# Patient Record
Sex: Male | Born: 1950
Health system: Southern US, Community
[De-identification: ages and names within clinical notes are randomized; demographics above are authoritative.]

## PROBLEM LIST (undated history)

## (undated) DIAGNOSIS — G20A1 Parkinson's disease without dyskinesia, without mention of fluctuations: Secondary | ICD-10-CM

## (undated) DIAGNOSIS — F32A Depression, unspecified: Secondary | ICD-10-CM

## (undated) DIAGNOSIS — M545 Low back pain, unspecified: Secondary | ICD-10-CM

## (undated) DIAGNOSIS — F319 Bipolar disorder, unspecified: Secondary | ICD-10-CM

## (undated) DIAGNOSIS — M1990 Unspecified osteoarthritis, unspecified site: Secondary | ICD-10-CM

## (undated) DIAGNOSIS — F419 Anxiety disorder, unspecified: Secondary | ICD-10-CM

## (undated) DIAGNOSIS — G8929 Other chronic pain: Secondary | ICD-10-CM

## (undated) DIAGNOSIS — K219 Gastro-esophageal reflux disease without esophagitis: Secondary | ICD-10-CM

## (undated) DIAGNOSIS — F429 Obsessive-compulsive disorder, unspecified: Secondary | ICD-10-CM

## (undated) DIAGNOSIS — IMO0002 Reserved for concepts with insufficient information to code with codable children: Secondary | ICD-10-CM

## (undated) DIAGNOSIS — J189 Pneumonia, unspecified organism: Secondary | ICD-10-CM

## (undated) DIAGNOSIS — G20 Parkinson's disease: Secondary | ICD-10-CM

## (undated) DIAGNOSIS — I639 Cerebral infarction, unspecified: Secondary | ICD-10-CM

## (undated) DIAGNOSIS — F329 Major depressive disorder, single episode, unspecified: Secondary | ICD-10-CM

## (undated) HISTORY — DX: Major depressive disorder, single episode, unspecified: F32.9

## (undated) HISTORY — DX: Depression, unspecified: F32.A

## (undated) HISTORY — DX: Bipolar disorder, unspecified: F31.9

## (undated) HISTORY — PX: COLONOSCOPY: SHX174

## (undated) HISTORY — DX: Cerebral infarction, unspecified: I63.9

## (undated) HISTORY — DX: Gastro-esophageal reflux disease without esophagitis: K21.9

## (undated) HISTORY — DX: Parkinson's disease: G20

## (undated) HISTORY — DX: Reserved for concepts with insufficient information to code with codable children: IMO0002

## (undated) HISTORY — DX: Unspecified osteoarthritis, unspecified site: M19.90

## (undated) HISTORY — DX: Parkinson's disease without dyskinesia, without mention of fluctuations: G20.A1

---
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
## 1974-12-15 HISTORY — PX: TONSILLECTOMY: SUR1361

## 1996-12-15 HISTORY — PX: INGUINAL HERNIA REPAIR: SUR1180

## 2006-09-08 ENCOUNTER — Ambulatory Visit: Payer: Self-pay | Admitting: Gastroenterology

## 2006-09-21 ENCOUNTER — Encounter (INDEPENDENT_AMBULATORY_CARE_PROVIDER_SITE_OTHER): Payer: Self-pay | Admitting: *Deleted

## 2006-09-21 ENCOUNTER — Ambulatory Visit: Payer: Self-pay | Admitting: Gastroenterology

## 2009-02-21 ENCOUNTER — Inpatient Hospital Stay (HOSPITAL_COMMUNITY): Admission: EM | Admit: 2009-02-21 | Discharge: 2009-02-22 | Payer: Self-pay | Admitting: Emergency Medicine

## 2009-02-21 ENCOUNTER — Encounter (INDEPENDENT_AMBULATORY_CARE_PROVIDER_SITE_OTHER): Payer: Self-pay | Admitting: Internal Medicine

## 2009-02-22 ENCOUNTER — Encounter (INDEPENDENT_AMBULATORY_CARE_PROVIDER_SITE_OTHER): Payer: Self-pay | Admitting: Internal Medicine

## 2009-09-25 ENCOUNTER — Encounter: Admission: RE | Admit: 2009-09-25 | Discharge: 2009-09-25 | Payer: Self-pay | Admitting: Family Medicine

## 2011-01-15 ENCOUNTER — Inpatient Hospital Stay (HOSPITAL_COMMUNITY)
Admission: EM | Admit: 2011-01-15 | Discharge: 2011-01-17 | DRG: 069 | Disposition: A | Discharge status: Home / Self Care | Payer: Non-veteran care | Attending: Internal Medicine | Admitting: Internal Medicine

## 2011-01-15 ENCOUNTER — Emergency Department (HOSPITAL_COMMUNITY): Payer: Non-veteran care

## 2011-01-15 DIAGNOSIS — G252 Other specified forms of tremor: Secondary | ICD-10-CM | POA: Diagnosis present

## 2011-01-15 DIAGNOSIS — G92 Toxic encephalopathy: Secondary | ICD-10-CM | POA: Diagnosis present

## 2011-01-15 DIAGNOSIS — F319 Bipolar disorder, unspecified: Secondary | ICD-10-CM | POA: Diagnosis present

## 2011-01-15 DIAGNOSIS — G929 Unspecified toxic encephalopathy: Secondary | ICD-10-CM | POA: Diagnosis present

## 2011-01-15 DIAGNOSIS — Z7982 Long term (current) use of aspirin: Secondary | ICD-10-CM

## 2011-01-15 DIAGNOSIS — Z8673 Personal history of transient ischemic attack (TIA), and cerebral infarction without residual deficits: Secondary | ICD-10-CM

## 2011-01-15 DIAGNOSIS — G250 Essential tremor: Secondary | ICD-10-CM | POA: Diagnosis present

## 2011-01-15 DIAGNOSIS — G459 Transient cerebral ischemic attack, unspecified: Principal | ICD-10-CM | POA: Diagnosis present

## 2011-01-15 DIAGNOSIS — K219 Gastro-esophageal reflux disease without esophagitis: Secondary | ICD-10-CM | POA: Diagnosis present

## 2011-01-15 LAB — POCT I-STAT 3, ART BLOOD GAS (G3+)
Acid-Base Excess: 1 mmol/L (ref 0.0–2.0)
Bicarbonate: 26.3 mEq/L — ABNORMAL HIGH (ref 20.0–24.0)
O2 Saturation: 94 %
TCO2: 28 mmol/L (ref 0–100)
pCO2 arterial: 42.7 mmHg (ref 35.0–45.0)
pH, Arterial: 7.398 (ref 7.350–7.450)

## 2011-01-15 LAB — DIFFERENTIAL
Basophils Absolute: 0 10*3/uL (ref 0.0–0.1)
Eosinophils Absolute: 0.2 10*3/uL (ref 0.0–0.7)
Eosinophils Relative: 3 % (ref 0–5)
Lymphocytes Relative: 22 % (ref 12–46)
Lymphs Abs: 2 10*3/uL (ref 0.7–4.0)
Monocytes Relative: 8 % (ref 3–12)
Neutro Abs: 6.2 10*3/uL (ref 1.7–7.7)
Neutrophils Relative %: 67 % (ref 43–77)

## 2011-01-15 LAB — CBC
HCT: 40.3 % (ref 39.0–52.0)
Hemoglobin: 13.1 g/dL (ref 13.0–17.0)
MCV: 94.6 fL (ref 78.0–100.0)
RBC: 4.26 MIL/uL (ref 4.22–5.81)

## 2011-01-15 LAB — COMPREHENSIVE METABOLIC PANEL
ALT: 20 U/L (ref 0–53)
Alkaline Phosphatase: 45 U/L (ref 39–117)
BUN: 10 mg/dL (ref 6–23)
CO2: 26 mEq/L (ref 19–32)
Chloride: 102 mEq/L (ref 96–112)
Glucose, Bld: 106 mg/dL — ABNORMAL HIGH (ref 70–99)
Total Protein: 6.7 g/dL (ref 6.0–8.3)

## 2011-01-15 LAB — URINALYSIS, ROUTINE W REFLEX MICROSCOPIC
Bilirubin Urine: NEGATIVE
Hgb urine dipstick: NEGATIVE
Nitrite: NEGATIVE
Protein, ur: NEGATIVE mg/dL
pH: 8.5 — ABNORMAL HIGH (ref 5.0–8.0)

## 2011-01-15 LAB — CK TOTAL AND CKMB (NOT AT ARMC): Relative Index: INVALID (ref 0.0–2.5)

## 2011-01-15 LAB — RAPID URINE DRUG SCREEN, HOSP PERFORMED
Amphetamines: NOT DETECTED
Benzodiazepines: POSITIVE — AB
Cocaine: NOT DETECTED
Opiates: POSITIVE — AB

## 2011-01-15 LAB — TROPONIN I: Troponin I: 0.01 ng/mL (ref 0.00–0.06)

## 2011-01-15 LAB — PROTIME-INR: Prothrombin Time: 13.5 seconds (ref 11.6–15.2)

## 2011-01-16 ENCOUNTER — Inpatient Hospital Stay (HOSPITAL_COMMUNITY): Payer: Non-veteran care

## 2011-01-16 LAB — CBC
HCT: 38.8 % — ABNORMAL LOW (ref 39.0–52.0)
Hemoglobin: 12.7 g/dL — ABNORMAL LOW (ref 13.0–17.0)
MCH: 30.7 pg (ref 26.0–34.0)
MCHC: 32.7 g/dL (ref 30.0–36.0)
RBC: 4.14 MIL/uL — ABNORMAL LOW (ref 4.22–5.81)
WBC: 7.9 10*3/uL (ref 4.0–10.5)

## 2011-01-16 LAB — DIFFERENTIAL
Basophils Absolute: 0 10*3/uL (ref 0.0–0.1)
Basophils Relative: 0 % (ref 0–1)
Lymphocytes Relative: 23 % (ref 12–46)
Monocytes Relative: 9 % (ref 3–12)

## 2011-01-16 LAB — LIPID PANEL
Cholesterol: 133 mg/dL (ref 0–200)
Triglycerides: 127 mg/dL (ref <150)

## 2011-01-16 LAB — COMPREHENSIVE METABOLIC PANEL
ALT: 15 U/L (ref 0–53)
AST: 18 U/L (ref 0–37)
Alkaline Phosphatase: 44 U/L (ref 39–117)
BUN: 10 mg/dL (ref 6–23)
CO2: 25 mEq/L (ref 19–32)
Calcium: 9.4 mg/dL (ref 8.4–10.5)
Creatinine, Ser: 1.07 mg/dL (ref 0.4–1.5)
GFR calc non Af Amer: >60 mL/min (ref >60)
Glucose, Bld: 116 mg/dL — ABNORMAL HIGH (ref 70–99)
Sodium: 137 mEq/L (ref 135–145)
Total Bilirubin: 0.5 mg/dL (ref 0.3–1.2)
Total Protein: 6.9 g/dL (ref 6.0–8.3)

## 2011-01-16 LAB — LITHIUM LEVEL: Lithium Lvl: 0.82 mEq/L (ref 0.80–1.40)

## 2011-01-16 LAB — HEMOGLOBIN A1C: Hgb A1c MFr Bld: 5.7 % — ABNORMAL HIGH (ref <5.7)

## 2011-01-17 ENCOUNTER — Inpatient Hospital Stay (HOSPITAL_COMMUNITY): Payer: Non-veteran care

## 2011-01-18 NOTE — H&P (Signed)
NAME:  TAMI, BARREN NO.:  [id number]  MEDICAL RECORD NO.:  [id number]           PATIENT TYPE:  E  LOCATION:  [facility]                         FACILITY:  [facility]  PHYSICIAN:  Talmage Nap, MD  DATE OF BIRTH:  1951-09-22  DATE OF ADMISSION:  01/15/2011 DATE OF DISCHARGE:                             HISTORY & PHYSICAL   ADMITTING PHYSICIAN:  Talmage Nap, MD  PRIMARY CARE PHYSICIAN:  Unassigned.  History obtainable from the patient and the patient's spouse.  CHIEF COMPLAINT:  Slurred speech, which occurred about 7:30 a.m. today, which January 15, 2011.  The patient is a 60 year old Caucasian male with prior history of TIA (3 in the past), bipolar affective disorder and essential tremor presenting to the emergency room with slurred speech, which started at about 7:30 a.m. on January 15, 2011.  The patient claimed that for some time, he had not been feeling well.  He had generalized feeling of weakness; however, today at about 7:30 a.m., the patient's speech was observed to be slurred and spouse says she was unable to understand what the patient was saying.  He denied any history of chest pain.  He denied any shortness of breath.  He denied any nausea or vomiting, no fever, no chills, no rigor, also denied any headaches.  Baseline, the patient is said to have unsteady gait because of his tremor.  Symptom was said to have persisted until the patient presented to the emergency room at about 3:30 p.m. At the time, the patient was seen by me in the emergency room, symptoms had resolved.  He was able to verbalize, speech no slurred and after evaluating the patient, he was advised to be admitted for further workup.  PAST MEDICAL HISTORY:  Positive for: 1. Multiple TIAs (three in the past). 2. Bipolar affective disorder. 3. Essential tremor.  PAST SURGICAL HISTORY: 1. Hernia repair. 2. Appendectomy.  PREADMISSION MEDICATIONS: 1. Bupropion 150 mg p.o.  b.i.d. 2. Calcium 600 mg plus D p.o. b.i.d. 3. Clonazepam 0.5 mg p.o. daily. 4. Fish oil two caps p.o. b.i.d. 5. Hydrocodone/APAP on a p.r.n. basis. 6. Lithium carbonate 900 mg at bedtime. 7. Methocarbamol 750 mg p.o. b.i.d. 8. Risperidone 2 mg p.o. at bedtime. 9. Trazodone 100 mg p.o. at bedtime. 10.Ranitidine 150 mg p.o. b.i.d.  He has no known allergies.  SOCIAL HISTORY:  Negative for alcohol or tobacco use and the patient is currently on disability.  FAMILY HISTORY:  Positive for high blood pressure.  REVIEW OF SYSTEMS:  The patient denies any history of headaches.  No blurred vision.  No nausea or vomiting.  No fever.  No chills.  No rigor.  No chest pain.  No shortness of breath.  Denies any cough.  No abdominal discomfort.  No diarrhea or hematochezia.  No dysuria or hematuria.  No swelling of the lower extremity.  No intolerance to heat or cold.  No neuropsychiatric disorders.  The patient only complaint that he was hungry and he wants to eat.  PHYSICAL EXAMINATION:  GENERAL:  A middle-aged man, well-hydrated, not in any obvious respiratory distress. PRESENT VITAL SIGNS:  Blood pressure is  127/63, pulse 76, respiratory rate is 18. HEENT:  Pupils are reactive to light.  Extraocular muscles are intact. NECK:  No jugular venous distention.  No carotid bruit.  No lymphadenopathy. CHEST:  Clear to auscultation. HEART:  Heart sounds are 1 and 2. ABDOMEN:  Soft, nontender.  Liver, spleen, kidney not palpable.  Bowel sounds are positive. EXTREMITIES:  No pedal edema. NEUROLOGIC:  Speech to be appropriate.  Muscle power right upper extremity 5/5, right lower extremity 5/5, left upper extremity 5/5, left lower extremity 5/5.  Deep tendon reflexes intact.  No clonus.  LABORATORY DATA:  Coagulation profile done on the patient showed PT 13.5, INR 1.02, and APTT of 29.  Hematological indices showed WBC of 9.2, hemoglobin of 13.1, hematocrit of 40.3, MCV of 94.6, platelet  count of 184, normal differential.  Chemistry showed sodium of 138, potassium of 4.1, chloride of 102 with a bicarb of 26, glucose is 106, BUN is 10, creatinine is 1.6.  LFTs normal.  Imaging studies done on the patient include CT of the head without contrast, which showed no intracranial hemorrhage or evidence of acute infarct.  There is mild nonspecific white matter-type changes.  IMPRESSION: 1. Transient ischemic attack, resolved. 2. Prior history of multiple transient ischemic attacks. 3. Bipolar affective disorder. 4. Essential tremor.  PLAN:  Admit the patient to Neuro telemetry.  Orders  essentially include stroke admission orders.  Medications to be given to the patient will include; 1. Aspirin 325 mg p.o. daily. 2. Bupropion 150 mg p.o. b.i.d. 3. Calcium 600 mg p.o. b.i.d. 4. Clonazepam 0.5 mg daily. 5. Fish oil three caps p.o. daily. 6. Lithium 100 mg p.o. at bedtime. 7. Risperidone to 2 mg p.o. at bedtime. 8. Trazodone 100 mg p.o. at bedtime.  The patient will also be on     Protonix 40 mg p.o. daily for gastrointestinal prophylaxis and     Lovenox 40 mg subcu q.24 h. for DVT prophylaxis.  Further labs to be ordered on this patient will include lithium level in a.m., CBC, CMP and magnesium will be repeated in a.m., and finally, Neurology will be consulted for further evaluation of this patient.  The patient will be followed and evaluated on day-to-day basis.     Talmage Nap, MD     CN/MEDQ  D:  01/15/2011  T:  01/15/2011  Job:  [id number]  Electronically Signed by Talmage Nap  on 01/15/2011 11:03:48 PM

## 2011-01-28 NOTE — Consult Note (Signed)
NAME:  Alan, Brown NO.:  [id number]  MEDICAL RECORD NO.:  [id number]           PATIENT TYPE:  I  LOCATION:  3017                         FACILITY:  [facility]  PHYSICIAN:  Marlan Palau, M.D.  DATE OF BIRTH:  1951/02/26  DATE OF CONSULTATION:  01/15/2011 DATE OF DISCHARGE:                                CONSULTATION   HISTORY OF PRESENT ILLNESS:  Alan Brown is a 60 year old right- handed white male born [birth year], with a history of bipolar disorder and depression.  This patient is followed through the [facility] and is on a multitude of psychiatric medications including lithium, clonazepam, trazodone, risperidone.  The patient also takes methocarbamol and hydrocodone for his chronic back pain.  This patient was normal last evening when he went to bed around 10 p.m.  This morning around 6 a.m., his wife tried to wake him up and found that he was quite lethargic.  Eventually, the patient did rouse, but seemed to have slurred speech with staggering.  The patient was still sleepy throughout the day.  The patient seemed to get a bit worse throughout the late morning, but by 1-2 p.m., the patient gradually started getting better and has gotten much better this evening, but not quite returned back to normal.  The patient still is slurring his words slightly.  CT of the head was completely unremarkable.  Neurology has been asked to see this patient for an evaluation of a possible stroke or TIA.  PAST MEDICAL HISTORY:  Significant for: 1. Altered mental status with toxic metabolic encephalopathy on     admission. 2. Bipolar disorder. 3. Questionable history of cerebrovascular disease with TIA events in     the past. 4. Gastroesophageal reflux disease. 5. History of chronic low back pain. 6. Appendectomy. 7. Inguinal hernia repair. 8. History of tremors, likely related to lithium use.  The patient has no known allergies.  Does not smoke or  drink.  Medications include: 1. Wellbutrin 150 mg twice daily, but the patient had not been taking     this medication. 2. The patient is on calcium 500 mg. 3. Vitamin D 400 units taking 1 twice a day. 4. Clonazepam 1 mg tablet one half tablet daily. 5. Fish oil 1000 mg 3 capsules twice daily. 6. Hydrocodone 10 mg/325 two tablets 4 times a day if needed. 7. Ketoconazole shampoo if needed. 8. Lithium 450 mg 2 tablets at bedtime. 9. Methocarbamol 750 mg 1 tablet twice daily if needed. 10.Ranitidine 150 mg 1 twice daily for reflux. 11.Risperidone 4 mg one half tablet at bedtime. 12.Trazodone 100 mg 1 tablet at bedtime.  SOCIAL HISTORY:  This patient is married, lives in the [place], [place] area.  The patient is on disability, has 2 children.  FAMILY MEDICAL HISTORY:  Mother is alive and well.  Father died with an aneurysm.  Has one older sister with heart disease and stroke, a younger sister was murdered.  The patient has no brothers.  The patient has one sister who is alive and well.  REVIEW OF SYSTEMS:  Notable for no fevers, chills.  The patient  denies headaches, neck pain.  Does have chronic low back pain.  Denies shortness of breath, chest pains.  The patient denies nausea, vomiting, has chronic constipation.  The patient recently got a shingles vaccination.  PHYSICAL EXAMINATION:  VITAL SIGNS:  Blood pressure 127/63, heart rate 76, respiratory rate 18, the patient is afebrile. GENERAL:  The patient is a fairly well-developed white male who is alert, cooperative at the time of examination. HEENT:  Head is atraumatic.  Eyes, pupils are round and reactive to light. NECK:  Supple.  No carotid bruits noted. RESPIRATORY:  Clear. CARDIOVASCULAR:  Regular rate and rhythm.  No obvious murmurs or rubs noted. EXTREMITIES:  Without significant edema. NEUROLOGIC:  Cranial nerves as above.  Facial symmetry is present.  The patient has good sensation of face to pinprick,  soft touch bilaterally. He has good strength of facial muscle, muscles to head turn and shoulder shrug bilaterally.  Speech is minimally dysarthric.  Motor testing reveals 5/5 strength in all fours.  Good symmetric motor tone is noted throughout.  Sensory testing is intact to pinprick, soft touch, and vibratory sensation throughout.  The patient has good finger-nose-finger and heel-to-shin bilaterally.  Gait was not tested with arms outstretched.  The patient has prominent asterixis on both upper extremities.  Again, deep tendon reflexes are symmetric and normal. Toes are downgoing bilaterally.  Laboratory values notable for a white count of 9.2, hemoglobin of 13.1, hematocrit of 40.3, MCV of 94.6, platelets of 184, INR of 1.01.  Sodium 138, potassium 4.1, chloride of 102, CO2 of 26, glucose of 106, BUN of 10, creatinine of 1.16.  Alk phosphatase 45, SGOT of 18, SGPT of 20, total protein 6.7, albumin 3.7.  Calcium 9.1, CK-MB fraction 2.6, troponin I 0.01.  CT of the head is as above.  IMPRESSION: 1. Toxic metabolic encephalopathy/rule out drug overdose. 2. Bipolar disorder.  This patient is on a multitude of medications that are psychoactive and sedating including clonazepam, hydrocodone, lithium, methocarbamol, risperidone, and trazodone.  The patient had been on primidone previously, but is not on this medication currently.  This patient presents in a fashion consistent with a drug overdose.  Need to rule out other entities such as lithium toxicity, hypercarbia.  I would not think that cerebrovascular disease is high on the list of possibilities causing the above clinical presentation.  We will check a urine drug screen, thyroid profile, blood gas, ammonia level, and lithium level.  We will follow the patient's clinical course while in house.  We expect this patient to gradually improve to his baseline within the next 24 hours.     Marlan Palau, M.D.     CKW/MEDQ  D:   01/15/2011  T:  01/16/2011  Job:  [id number]  cc:   [facility]  Electronically Signed by Thana Farr M.D. on 01/28/2011 09:57:09 AM

## 2011-01-29 NOTE — Discharge Summary (Signed)
NAME:  Alan Brown, Alan Brown NO.:  [id number]  MEDICAL RECORD NO.:  [id number]           PATIENT TYPE:  I  LOCATION:  3017                         FACILITY:  [facility]  PHYSICIAN:  Peggye Pitt, M.D. DATE OF BIRTH:  [birth year]  DATE OF ADMISSION:  01/15/2011 DATE OF DISCHARGE:  01/17/2011                              DISCHARGE SUMMARY   PRIMARY CARE PHYSICIAN:  L. Lupe Carney, MD  DISCHARGE DIAGNOSES: 1. Transient ischemic attack with history of multiple transient     ischemic attacks in the past. 2. Bipolar disorder. 3. Essential tremor.  DISCHARGE MEDICATIONS: 1. Plavix 75 mg daily. 2. Wellbutrin 150 mg twice daily. 3. Os-Cal D 1 tablet twice daily. 4. Clonazepam 0.5 mg daily. 5. Fish oil 3 capsules twice daily. 6. Hydrocodone 5/325 mg 1 tablet every 6 hours as needed. 7. Lithium 300 mg to take 3 tablets at bedtime. 8. Ranitidine 150 mg 2 times a day. 9. Risperdal 2 mg at bedtime.10.Trazodone 100 mg at bedtime.  DISPOSITION AND FOLLOWUP:  [name] will be discharged home today in stable condition.  He is instructed to follow up with Dr. Clovis Riley as previously scheduled.  CONSULTATION IN THIS HOSPITALIZATION:  None.  IMAGES AND PROCEDURES: 1. A CT scan on February 1 of the head that showed no intracranial     hemorrhage or CT evidence of large acute infarct.  Mild nonspecific     white matter-type changes. 2. An MRI of the brain on February 3 that showed no acute or     reversible findings.  Essentially normal for age.  Very mild small     vessel disease change within the hemispheric white matter less than     often seen in healthy individuals of this age.  HISTORY AND PHYSICAL:  For full details, please see dictation on February 1 by Dr. Beverly Gust, but in brief, [name] is a pleasant 60- year-old gentleman with a history of TIAs and essential tremor who came into the hospital with slurred speech and erratic behavior.  All of this was  described by his wife [name].  Please see H and P for details.  HOSPITAL COURSE BY PROBLEM: 1. Recurrent TIAs.  MRI on this admission has been negative for stroke     and he has had the regular stroke workup including Dopplers and     echoes which have been normal.  His aspirin has been upgraded to     Plavix.  PT and OT have evaluated him and believe that he does not     require any assistance at this time.  He will be discharged home     today. 2. Bipolar disorder.  He is recommended to continue with his     psychiatric medications and follow up with his psychiatrist in the     outpatient setting.  Vitals on day of discharge; blood pressure 120/67, heart rate 79, respirations 20, sats of 96% on room air, and temperature of 97.6.     Peggye Pitt, M.D.     EH/MEDQ  D:  01/17/2011  T:  01/18/2011  Job:  [id number]  cc:  Elsworth Soho, M.D.  Electronically Signed by Peggye Pitt M.D. on 01/29/2011 08:16:10 AM

## 2011-03-27 LAB — COMPREHENSIVE METABOLIC PANEL
ALT: 28 U/L (ref 0–53)
AST: 26 U/L (ref 0–37)
Albumin: 3.9 g/dL (ref 3.5–5.2)
CO2: 28 mEq/L (ref 19–32)
Chloride: 104 mEq/L (ref 96–112)
GFR calc Af Amer: >60 mL/min (ref >60)
GFR calc non Af Amer: >60 mL/min (ref >60)
Potassium: 4.1 mEq/L (ref 3.5–5.1)
Sodium: 138 mEq/L (ref 135–145)
Total Bilirubin: 0.8 mg/dL (ref 0.3–1.2)

## 2011-03-27 LAB — URINE CULTURE
Colony Count: NO GROWTH
Culture: NO GROWTH

## 2011-03-27 LAB — POCT I-STAT, CHEM 8
Calcium, Ion: 1.18 mmol/L (ref 1.12–1.32)
Chloride: 105 mEq/L (ref 96–112)
Creatinine, Ser: 1.1 mg/dL (ref 0.4–1.5)
Glucose, Bld: 100 mg/dL — ABNORMAL HIGH (ref 70–99)
HCT: 44 % (ref 39.0–52.0)

## 2011-03-27 LAB — CARDIAC PANEL(CRET KIN+CKTOT+MB+TROPI)
CK, MB: 3.1 ng/mL (ref 0.3–4.0)
CK, MB: 4.5 ng/mL — ABNORMAL HIGH (ref 0.3–4.0)
Relative Index: 2.6 — ABNORMAL HIGH (ref 0.0–2.5)
Relative Index: 2.8 — ABNORMAL HIGH (ref 0.0–2.5)
Troponin I: 0.01 ng/mL (ref 0.00–0.06)
Troponin I: <0.01 ng/mL (ref 0.00–0.06)
Troponin I: <0.01 ng/mL (ref 0.00–0.06)

## 2011-03-27 LAB — DIFFERENTIAL
Eosinophils Absolute: 0.2 10*3/uL (ref 0.0–0.7)
Eosinophils Relative: 2 % (ref 0–5)
Lymphs Abs: 1.8 10*3/uL (ref 0.7–4.0)

## 2011-03-27 LAB — CBC
HCT: 42.5 % (ref 39.0–52.0)
MCHC: 35.1 g/dL (ref 30.0–36.0)
MCV: 93.7 fL (ref 78.0–100.0)
MCV: 93.7 fL (ref 78.0–100.0)
Platelets: 187 10*3/uL (ref 150–400)
Platelets: 193 10*3/uL (ref 150–400)
RBC: 4.16 MIL/uL — ABNORMAL LOW (ref 4.22–5.81)
RDW: 12.6 % (ref 11.5–15.5)
RDW: 12.8 % (ref 11.5–15.5)

## 2011-03-27 LAB — URINALYSIS, ROUTINE W REFLEX MICROSCOPIC
Bilirubin Urine: NEGATIVE
Glucose, UA: NEGATIVE mg/dL
Hgb urine dipstick: NEGATIVE
Specific Gravity, Urine: 1.021 (ref 1.005–1.030)
Urobilinogen, UA: 0.2 mg/dL (ref 0.0–1.0)
pH: 6.5 (ref 5.0–8.0)

## 2011-03-27 LAB — LIPID PANEL
Cholesterol: 134 mg/dL (ref 0–200)
HDL: 31 mg/dL — ABNORMAL LOW (ref >39)
Total CHOL/HDL Ratio: 4.3 RATIO
Triglycerides: 129 mg/dL (ref <150)

## 2011-03-27 LAB — ETHANOL: Alcohol, Ethyl (B): <5 mg/dL (ref 0–10)

## 2011-03-27 LAB — POCT CARDIAC MARKERS
CKMB, poc: 2.3 ng/mL (ref 1.0–8.0)
Troponin i, poc: <0.05 ng/mL (ref 0.00–0.09)

## 2011-03-27 LAB — HOMOCYSTEINE: Homocysteine: 7 umol/L (ref 4.0–15.4)

## 2011-03-27 LAB — BASIC METABOLIC PANEL
BUN: 13 mg/dL (ref 6–23)
Chloride: 104 mEq/L (ref 96–112)
Creatinine, Ser: 1.04 mg/dL (ref 0.4–1.5)
GFR calc non Af Amer: >60 mL/min (ref >60)
Glucose, Bld: 103 mg/dL — ABNORMAL HIGH (ref 70–99)
Potassium: 3.6 mEq/L (ref 3.5–5.1)

## 2011-03-27 LAB — PROTIME-INR: Prothrombin Time: 12.7 seconds (ref 11.6–15.2)

## 2011-03-27 LAB — RAPID URINE DRUG SCREEN, HOSP PERFORMED: Barbiturates: NOT DETECTED

## 2011-04-29 NOTE — H&P (Signed)
NAME:  Alan, Brown NO.:  [id number]   MEDICAL RECORD NO.:  [id number]          PATIENT TYPE:  EMS   LOCATION:  [facility]                         FACILITY:  [facility]   PHYSICIAN:  Ramiro Harvest, MD    DATE OF BIRTH:  09/13/1951   DATE OF ADMISSION:  02/21/2009  DATE OF DISCHARGE:                              HISTORY & PHYSICAL   PRIMARY CARE PHYSICIAN:  Dr. Lupe Carney of [facility].   HISTORY OF PRESENT ILLNESS:  Alan Brown is a 60 year old white male  veteran with past medical history of bipolar disorder, post-traumatic  stress disorder, depression, gastroesophageal reflux disease, chronic  low back pain, who presents to the ED with an expressive aphasia.  Per  patient, he states that, the night prior to admission, he was talking to  his wife, but was unable to get the words out, which lasted  approximately an hour.  The patient denied any numbness.  No weakness,  no slurred speech, no facial droop.  No visual changes, no fever no  chills, no chest pain, no shortness of breath, no nausea, no vomiting,  no abdominal pain, no diarrhea, no constipation, no melena, no  hematemesis, no hematochezia.  The patient stated that he had a similar  episode two weeks prior to admission.  The patient also stated that he  kept falling asleep on the day of admission while trying to feed his  twin children.  The patient called his friend who then brought him to  the ED.  In the ED, coags obtained were within normal limits, I-STAT 8  was within normal limits.  CBC within normal limits.  EKG with normal  sinus rhythm.  Head CT was negative.  We were called to admit the  patient for further evaluation and management.   ALLERGIES:  NO KNOWN DRUG ALLERGIES.   PAST MEDICAL HISTORY:  1. Bipolar disorder.  2. Post-traumatic stress disorder.  3. Depression.  4. Gastroesophageal reflux disease.  5. Chronic low back pain.   HOME MEDICATIONS:  1. Lithium 450 mg p.o.  b.i.d.  2. Risperdal 3 mg p.o. q.h.s.  3. Trazodone 100 mg p.o. q.h.s.  4. Primidone 50 mg p.o. daily.  5. Bupropion 150 mg p.o. twice a day  6. ranitidine 150 mg p.o. b.i.d.  7. Vicodin 5/500 one tablet p.o. p.r.n.  8. Aspirin 81 mg p.o. daily.  9. Methocarbamol 750 mg p.o. q.i.d. p.r.n.   SOCIAL HISTORY:  The patient is married.  He is a disabled Cytogeneticist.  No  tobacco use.  No alcohol use.  No IV drug use.  Lives in [place].  Has two 65-month twin children.   FAMILY HISTORY:  Father deceased at age 80, cause unknown.  Mother alive  at age 65 with coronary artery disease.  Two sisters alive, one with  coronary artery disease, status post CABG, the other healthy.  One  sister deceased from a homicide.  No family history of CVAs or strokes.   REVIEW OF SYSTEMS:  Per HPI, otherwise negative.   PHYSICAL EXAM:  VITAL SIGNS:  Temperature 97.6, blood pressure 149/87,  pulse of 83, respiratory rate 16, satting 96%.  GENERAL:  Patient in no apparent distress.  HEENT: Normocephalic, atraumatic.  Pupils equal, round and reactive to  light and accommodation.  Extraocular movements intact.  Oropharynx is  clear.  No lesions, no exudates.  NECK: Supple.  No lymphadenopathy.  RESPIRATORY:  Lungs are clear to auscultation bilaterally.  No wheezes,  no crackles.  No rhonchi.  CARDIOVASCULAR: Regular rate and rhythm.  No murmurs, rubs or gallops.  ABDOMEN: Soft, nontender, nondistended.  Positive bowel sounds.  EXTREMITIES: No clubbing, cyanosis or edema.  NEUROLOGICALLY:  The patient is alert and oriented x3.  Cranial nerves  II-XII are grossly intact.  Sensation is intact.  Cerebellum is intact.  Negative Babinski.  5/5 bilateral upper extremity strength, 5/5  bilateral lower extremity strength.  Unable to elicit reflexes diffusely  and symmetrically.  Visual fields are intact.  Gait not tested.   LABS:  PT 12.7, INR 0.9.  CBC:  White count 8.4, hemoglobin 14.7,  platelets 193, hematocrit  42.5, ANC of 5.8.  Sodium 138, potassium 3.7,  chloride 105, BUN 13, creatinine 1.1, glucose 100.  CT of the head:  No  acute or significant findings.   ASSESSMENT AND PLAN:  [name] is a 60 year old gentleman,  history of bipolar disorder, depression, post-traumatic stress disorder,  presented to the ED with an expressive aphasia.   1. Expressive aphasia/probable transient ischemic attack, likely      secondary to probable transient ischemic attack, versus psychiatric      etiology.  Will admit the patient to telemetry.  Check MRI of the      head and neck, check a comprehensive metabolic profile, check a      hemoglobin A1c, check a fasting lipid panel, check a homocystine      level, check alcohol level, check a lithium level, check carotid      Dopplers, check a 2-D echo.  We will place on aspirin 325 mg p.o.      daily.  Swallow evaluation.  If MRI is positive for CVA, we will      consult with neurology for further evaluation and recommendations,      PT, OT.  2. Bipolar disorder.  Check a lithium level.  Continue home regimen of      lithium and Risperdal.  3. Depression.  Stable.  Continue home dose Wellbutrin.  4. Cigarettes.  Ranitidine.  5. Chronic back pain.  Continue pain management.  6. Post-traumatic stress disorder.  7. Prophylaxis:  Protonix for GI prophylaxis.  SCDs for DVT      prophylaxis.   It has been a pleasure taking care of Mr. Bump.      Ramiro Harvest, MD  Electronically Signed     DT/MEDQ  D:  02/21/2009  T:  02/21/2009  Job:  [id number]   cc:   L. Lupe Carney, M.D.

## 2011-04-29 NOTE — Discharge Summary (Signed)
NAME:  Alan Brown, Alan Brown NO.:  [id number]   MEDICAL RECORD NO.:  [id number]          PATIENT TYPE:  INP   LOCATION:  3733                         FACILITY:  [facility]   PHYSICIAN:  Ramiro Harvest, MD    DATE OF BIRTH:  [birth year]   DATE OF ADMISSION:  02/21/2009  DATE OF DISCHARGE:  02/22/2009                               DISCHARGE SUMMARY   PRIMARY CARE PHYSICIAN:  Dr. Lupe Carney of [facility].   DISCHARGE DIAGNOSES:  1. Probable transient ischemic attack.  2. Bipolar disorder.  3. Depression.  4. Gastroesophageal reflux disease.  5. Chronic low back pain.  6. Post-traumatic stress disorder.   DISCHARGE MEDICATIONS:  1. Lithium 450 mg p.o. b.i.d.  2. Risperdal 3 mg p.o. q.h.s.  3. Trazodone 50 to 100 mg p.o. q.h.s.  4. Primidone 50 mg p.o. daily.  5. Bupropion 150 mg p.o. b.i.d.  6. Ranitidine 150 mg p.o. b.i.d.  7. Aspirin 325 mg p.o. daily.  8. Vicodin 5/500 one tab p.o. every 4 hours p.r.n.  9. Methocarbamol 750 mg p.o. q.i.d. p.r.n.   DISPOSITION AND FOLLOWUP:  Patient will be discharged home.  Patient is  to follow up with PCP in to 2 weeks.  On followup, 2D echo which was  obtained during the hospitalization will need to be followed up on per  PCP.  Patient will also need to be monitored for risk factor  modification and reassess his expressive aphasia.  Patient is also to  follow up with his psychiatrist as previously scheduled.   CONSULTATIONS DONE:  None.   PROCEDURES PERFORMED.:  1. A head CT without contrast was done on February 21, 2009, which showed      no acute or significant findings.  2. MRI/MRA of the head and neck was done on February 21, 2009, which      showed no acute infarct, minimal to mild nonspecific white matter-      type changes, mild paranasal sinus mucosal thickening, mild      intracranial atherosclerotic-type changes present.  Exam does not      include the proximal aspect of the great vessels.  No evidence of  hemodynamically significant stenosis involving either carotid      bifurcation.  3. Carotid Dopplers were done on February 22, 2009, and preliminary      results of carotid Dopplers showed no evidence of ICA stenosis.      Vertebral artery flow was antegrade.  4. A 2D echo was also done on February 22, 2009, with results pending at      the time of discharge and this will need to be followed up on per      PCP.   BRIEF ADMISSION HISTORY AND PHYSICAL:  [name] is a 60-year-  old white gentleman and Veteran with past medical history of bipolar  disorder, post-traumatic stress disorder, depression, gastroesophageal  reflux disease, chronic low back pain who presented to the ED with an  expressive aphasia.  The patient has stated that the night prior to  admission he was talking to his wife but was unable  to get the words out  which lasted approximately an hour.  Patient denied any numbness, no  weakness, no slurred speech.  No facial droop.  No visual changes.  No  fever.  No chills.  No chest pain.  No shortness of breath.  No nausea.  No vomiting.  No abdominal pain.  No diarrhea.  No constipation.  No  melena.  No hematemesis.  No hematochezia.  Patient stated that he had a  similar episode 2 weeks prior to admission.  Patient also stated that he  kept falling asleep on the day of admission while trying to feed his  twin children.  Patient called his friend who then brought him to the  ED.  In the ED, coags obtained were within normal limits.  ISTAT 8 was  within normal limits.  CBC was within normal limits.  EKG with a normal  sinus rhythm.  Head CT was negative as stated above.  We were called to  admit the patient for further evaluation and management.   PHYSICAL EXAM ON ADMISSION:  Temperature 97.6.  Blood pressure 149/87.  Pulse of 83.  Respiratory rate 16.  Saturating 96%.  GENERAL:  Patient in no apparent distress.  HEENT: Normocephalic, atraumatic.  Pupils equal, round,  and reactive to  light and accommodation.  Extraocular movements intact.  Oropharynx was  clear.  No lesions.  No exudates.  NECK:  Supple.  No lymphadenopathy.  RESPIRATORY:  Lungs were clear to auscultation bilaterally.  No wheezes,  no crackles, no rhonchi.  CARDIOVASCULAR:  Regular rate and rhythm.  No murmurs, rubs, or gallops.  ABDOMEN:  Soft, nontender, nondistended.  Positive bowel sounds.  EXTREMITIES:  No clubbing, cyanosis, or edema.  NEUROLOGICAL:  Patient was alert and oriented x3.  Cranial nerves II-XII  are grossly intact.  Sensation was intact.  Cerebellum was intact.  Negative Babinski.  5/5 bilateral upper extremity strength, 5/5  bilateral lower extremity strength.  Unable to elicit reflexes diffusely  and symmetrically.  Visual fields were intact.  Gait was not tested.   ADMISSION LABS:  PT of 12.7, INR of 0.9.  CBC with white count of 8.4,  hemoglobin 14.7, platelets 193, hematocrit 42.5, ANC of 5.8, sodium 138,  potassium 3.7, chloride 105, BUN 13, creatinine 1.1, glucose of 100.  Head CT showed no acute significant findings.   HOSPITAL COURSE:  Probable TIA/expressive aphasia.  Patient was brought  in for a stroke workup.  MRI was obtained which was negative as stated  above.  Carotid Dopplers were also obtained with preliminary results  which are negative for any significant ICA stenosis.  A 2D echo was  pending at the time of discharge which will need to be followed up on  per PCP.  A fasting lipid panel was obtained which showed a total  cholesterol of 134, triglycerides of 129, HDL of 31, LDL of 77.  Cardiac  enzymes were also cycled which came back negative x3.  An A1c was  obtained which was 5.3.  Patient remained in stable condition and by the  time of admission patient's symptoms had resolved.  Patient did not have  any further episodes of expressive aphasia or any neurological changes  throughout the hospitalization.  Patient was maintained on  full-dose  aspirin.  Patient will be discharged home on full-dose aspirin with  close followup with PCP in the next 1 to 2 weeks.  Risk factor  modification will need to be stressed to the patient  and patient's  expressive aphasia will need to be reassessed on followup with PCP.  Patient remained in stable and improved condition throughout the  hospitalization and patient will be discharged in stable and improved  condition.  The rest of the patient's chronic medical issues were stable  throughout the hospitalization and by day of discharge patient was in  stable condition.   On day of discharge, vital signs temperature 98.2, pulse of 60, blood  pressure 112/82, respiratory rate 18, saturating 95% on room air.   DISCHARGE LABS:  Sodium 136, potassium 3.6, chloride 104, bicarb 26, BUN  13, creatinine 1.04, glucose of 103, calcium of 8.6, hemoglobin A1c of  5.3.  Alcohol level less than 5.  Total cholesterol 134, triglycerides  129, HDL 31, LDL 77.  CBC with a white count of 9.6, hemoglobin of 13.7,  platelets of 187, hematocrit of 39.0.   It was a pleasure taking care of [name].      Ramiro Harvest, MD  Electronically Signed     DT/MEDQ  D:  02/22/2009  T:  02/22/2009  Job:  [id number]   cc:   L. Lupe Carney, M.D.

## 2011-09-11 ENCOUNTER — Encounter: Payer: Self-pay | Admitting: Gastroenterology

## 2012-04-29 ENCOUNTER — Encounter: Payer: Self-pay | Admitting: Gastroenterology

## 2012-06-07 ENCOUNTER — Ambulatory Visit (AMBULATORY_SURGERY_CENTER): Payer: Medicare Other | Admitting: *Deleted

## 2012-06-07 VITALS — Ht 71.0 in | Wt 212.0 lb

## 2012-06-07 DIAGNOSIS — Z1211 Encounter for screening for malignant neoplasm of colon: Secondary | ICD-10-CM

## 2012-06-07 MED ORDER — MOVIPREP 100 G PO SOLR: ORAL | Status: DC

## 2012-06-21 ENCOUNTER — Other Ambulatory Visit: Payer: Medicare Other | Admitting: Gastroenterology

## 2012-07-23 ENCOUNTER — Ambulatory Visit (AMBULATORY_SURGERY_CENTER): Payer: BC Managed Care – PPO | Admitting: Gastroenterology

## 2012-07-23 ENCOUNTER — Encounter: Payer: Self-pay | Admitting: Gastroenterology

## 2012-07-23 VITALS — BP 142/81 | HR 67 | Temp 97.8°F | Resp 15 | Ht 71.0 in | Wt 212.0 lb

## 2012-07-23 DIAGNOSIS — K5730 Diverticulosis of large intestine without perforation or abscess without bleeding: Secondary | ICD-10-CM

## 2012-07-23 DIAGNOSIS — Z86010 Personal history of colonic polyps: Secondary | ICD-10-CM

## 2012-07-23 DIAGNOSIS — Z1211 Encounter for screening for malignant neoplasm of colon: Secondary | ICD-10-CM

## 2012-07-23 MED ORDER — SODIUM CHLORIDE 0.9 % IV SOLN: 500.0000 mL | INTRAVENOUS | Status: DC

## 2012-07-23 NOTE — Progress Notes (Signed)
Patient did not experience any of the following events: a burn prior to discharge; a fall within the facility; wrong site/side/patient/procedure/implant event; or a hospital transfer or hospital admission upon discharge from the facility. (G8907) Patient did not have preoperative order for IV antibiotic SSI prophylaxis. (G8918)  

## 2012-07-23 NOTE — Op Note (Signed)
[facility] [address] [place], [place]  [address]  COLONOSCOPY PROCEDURE REPORT  PATIENT:  Alan Brown, Alan Brown  MR#:  [id number] BIRTHDATE:  01-14-1951, 61 yrs. old  GENDER:  male ENDOSCOPIST:  Rachael Fee, MD PROCEDURE DATE:  07/23/2012 PROCEDURE:  Colonoscopy 19147 ASA CLASS:  Class II INDICATIONS:  serrated adenoma 2007 MEDICATIONS:   MAC sedation, administered by CRNA, propofol (Diprivan) 230 mcg IV  DESCRIPTION OF PROCEDURE:   After the risks benefits and alternatives of the procedure were thoroughly explained, informed consent was obtained.  Digital rectal exam was performed and revealed no rectal masses.   The LB CF-[id number] endoscope was introduced through the anus and advanced to the cecum, which was identified by both the appendix and ileocecal valve, without limitations.  The quality of the prep was adequate..  The instrument was then slowly withdrawn as the colon was fully examined.<<PROCEDUREIMAGES>> FINDINGS:  Moderate diverticulosis was found in the sigmoid to descending colon segments (see image1).  This was otherwise a normal examination of the colon (see image2, image3, and image4). Retroflexed views in the rectum revealed no abnormalities. COMPLICATIONS:  None  ENDOSCOPIC IMPRESSION: 1) Moderate diverticulosis in the sigmoid to descending colon segments 2) Otherwise normal examination; no polyps or cancers  RECOMMENDATIONS: 1) Given your personal history of adenomatous (pre-cancerous) polyps, you will need a repeat colonoscopy in 5 years.  REPEAT EXAM:  5 yaers  ______________________________ Rachael Fee, MD  n. eSIGNED:   Rachael Fee at 07/23/2012 10:29 AM  Earlene Plater, [phone]

## 2012-07-23 NOTE — Patient Instructions (Signed)
Diverticulosis seen today, see handouts given. Try to follow high fiber diet also, see handout. Repeat colonoscopy in 5 years. Thank you!!   YOU HAD AN ENDOSCOPIC PROCEDURE TODAY AT THE [facility]: Refer to the procedure report that was given to you for any specific questions about what was found during the examination.  If the procedure report does not answer your questions, please call your gastroenterologist to clarify.  If you requested that your care partner not be given the details of your procedure findings, then the procedure report has been included in a sealed envelope for you to review at your convenience later.  YOU SHOULD EXPECT: Some feelings of bloating in the abdomen. Passage of more gas than usual.  Walking can help get rid of the air that was put into your GI tract during the procedure and reduce the bloating. If you had a lower endoscopy (such as a colonoscopy or flexible sigmoidoscopy) you may notice spotting of blood in your stool or on the toilet paper. If you underwent a bowel prep for your procedure, then you may not have a normal bowel movement for a few days.  DIET: Your first meal following the procedure should be a light meal and then it is ok to progress to your normal diet.  A half-sandwich or bowl of soup is an example of a good first meal.  Heavy or fried foods are harder to digest and may make you feel nauseous or bloated.  Likewise meals heavy in dairy and vegetables can cause extra gas to form and this can also increase the bloating.  Drink plenty of fluids but you should avoid alcoholic beverages for 24 hours.  ACTIVITY: Your care partner should take you home directly after the procedure.  You should plan to take it easy, moving slowly for the rest of the day.  You can resume normal activity the day after the procedure however you should NOT DRIVE or use heavy machinery for 24 hours (because of the sedation medicines used during the test).    SYMPTOMS TO  REPORT IMMEDIATELY: A gastroenterologist can be reached at any hour.  During normal business hours, 8:30 AM to 5:00 PM Monday through Friday, call [phone].  After hours and on weekends, please call the GI answering service at [phone] who will take a message and have the physician on call contact you.   Following lower endoscopy (colonoscopy or flexible sigmoidoscopy):  Excessive amounts of blood in the stool  Significant tenderness or worsening of abdominal pains  Swelling of the abdomen that is new, acute  Fever of 100F or higher  FOLLOW UP: If any biopsies were taken you will be contacted by phone or by letter within the next 1-3 weeks.  Call your gastroenterologist if you have not heard about the biopsies in 3 weeks.  Our staff will call the home number listed on your records the next business day following your procedure to check on you and address any questions or concerns that you may have at that time regarding the information given to you following your procedure. This is a courtesy call and so if there is no answer at the home number and we have not heard from you through the emergency physician on call, we will assume that you have returned to your regular daily activities without incident.  SIGNATURES/CONFIDENTIALITY: You and/or your care partner have signed paperwork which will be entered into your electronic medical record.  These signatures attest to the fact  that that the information above on your After Visit Summary has been reviewed and is understood.  Full responsibility of the confidentiality of this discharge information lies with you and/or your care-partner.

## 2012-07-26 ENCOUNTER — Telehealth: Payer: Self-pay | Admitting: *Deleted

## 2012-07-26 NOTE — Telephone Encounter (Signed)
  Follow up Call-  Call back number 07/23/2012  Post procedure Call Back phone  # [phone]  Permission to leave phone message Yes     Patient questions:  Message left to call us if necessary.

## 2013-11-19 ENCOUNTER — Encounter (HOSPITAL_COMMUNITY): Payer: Self-pay | Admitting: Emergency Medicine

## 2013-11-19 ENCOUNTER — Other Ambulatory Visit: Payer: Self-pay

## 2013-11-19 ENCOUNTER — Inpatient Hospital Stay (HOSPITAL_COMMUNITY)
Admission: EM | Admit: 2013-11-19 | Discharge: 2013-11-21 | DRG: 871 | Disposition: A | Discharge status: Home / Self Care | Payer: Non-veteran care | Attending: Internal Medicine | Admitting: Internal Medicine

## 2013-11-19 ENCOUNTER — Emergency Department (HOSPITAL_COMMUNITY): Payer: Non-veteran care

## 2013-11-19 DIAGNOSIS — J189 Pneumonia, unspecified organism: Secondary | ICD-10-CM | POA: Diagnosis present

## 2013-11-19 DIAGNOSIS — G9340 Encephalopathy, unspecified: Secondary | ICD-10-CM | POA: Diagnosis present

## 2013-11-19 DIAGNOSIS — M129 Arthropathy, unspecified: Secondary | ICD-10-CM | POA: Diagnosis present

## 2013-11-19 DIAGNOSIS — K219 Gastro-esophageal reflux disease without esophagitis: Secondary | ICD-10-CM | POA: Diagnosis present

## 2013-11-19 DIAGNOSIS — Z7982 Long term (current) use of aspirin: Secondary | ICD-10-CM

## 2013-11-19 DIAGNOSIS — E860 Dehydration: Secondary | ICD-10-CM | POA: Diagnosis present

## 2013-11-19 DIAGNOSIS — A419 Sepsis, unspecified organism: Principal | ICD-10-CM | POA: Diagnosis present

## 2013-11-19 DIAGNOSIS — F319 Bipolar disorder, unspecified: Secondary | ICD-10-CM | POA: Diagnosis present

## 2013-11-19 DIAGNOSIS — E872 Acidosis, unspecified: Secondary | ICD-10-CM | POA: Diagnosis present

## 2013-11-19 DIAGNOSIS — G8929 Other chronic pain: Secondary | ICD-10-CM | POA: Diagnosis present

## 2013-11-19 DIAGNOSIS — Z79899 Other long term (current) drug therapy: Secondary | ICD-10-CM

## 2013-11-19 DIAGNOSIS — R6510 Systemic inflammatory response syndrome (SIRS) of non-infectious origin without acute organ dysfunction: Secondary | ICD-10-CM | POA: Diagnosis present

## 2013-11-19 DIAGNOSIS — E785 Hyperlipidemia, unspecified: Secondary | ICD-10-CM | POA: Diagnosis present

## 2013-11-19 DIAGNOSIS — M549 Dorsalgia, unspecified: Secondary | ICD-10-CM | POA: Diagnosis present

## 2013-11-19 DIAGNOSIS — Z87891 Personal history of nicotine dependence: Secondary | ICD-10-CM

## 2013-11-19 DIAGNOSIS — R509 Fever, unspecified: Secondary | ICD-10-CM | POA: Diagnosis present

## 2013-11-19 LAB — CBC WITH DIFFERENTIAL/PLATELET
Basophils Relative: 0 % (ref 0–1)
Eosinophils Absolute: 0.4 10*3/uL (ref 0.0–0.7)
Lymphs Abs: 1.1 10*3/uL (ref 0.7–4.0)
MCH: 30.9 pg (ref 26.0–34.0)
MCHC: 32.7 g/dL (ref 30.0–36.0)
Neutro Abs: 14.3 10*3/uL — ABNORMAL HIGH (ref 1.7–7.7)
Neutrophils Relative %: 85 % — ABNORMAL HIGH (ref 43–77)
Platelets: 178 10*3/uL (ref 150–400)
RBC: 4.21 MIL/uL — ABNORMAL LOW (ref 4.22–5.81)

## 2013-11-19 LAB — STREP PNEUMONIAE URINARY ANTIGEN: Strep Pneumo Urinary Antigen: NEGATIVE

## 2013-11-19 LAB — COMPREHENSIVE METABOLIC PANEL
ALT: 10 U/L (ref 0–53)
AST: 15 U/L (ref 0–37)
Albumin: 3.1 g/dL — ABNORMAL LOW (ref 3.5–5.2)
Alkaline Phosphatase: 51 U/L (ref 39–117)
BUN: 9 mg/dL (ref 6–23)
Calcium: 8.1 mg/dL — ABNORMAL LOW (ref 8.4–10.5)
Chloride: 104 mEq/L (ref 96–112)
Potassium: 3.9 mEq/L (ref 3.5–5.1)
Sodium: 136 mEq/L (ref 135–145)
Total Protein: 6.3 g/dL (ref 6.0–8.3)

## 2013-11-19 LAB — URINALYSIS, ROUTINE W REFLEX MICROSCOPIC
Bilirubin Urine: NEGATIVE
Glucose, UA: NEGATIVE mg/dL
Hgb urine dipstick: NEGATIVE
Ketones, ur: NEGATIVE mg/dL
Nitrite: NEGATIVE
Specific Gravity, Urine: 1.018 (ref 1.005–1.030)
pH: 6.5 (ref 5.0–8.0)

## 2013-11-19 LAB — INFLUENZA PANEL BY PCR (TYPE A & B)
H1N1 flu by pcr: NOT DETECTED
Influenza A By PCR: NEGATIVE
Influenza B By PCR: NEGATIVE

## 2013-11-19 LAB — URINE MICROSCOPIC-ADD ON

## 2013-11-19 LAB — LITHIUM LEVEL: Lithium Lvl: 0.63 mEq/L — ABNORMAL LOW (ref 0.80–1.40)

## 2013-11-19 LAB — CG4 I-STAT (LACTIC ACID): Lactic Acid, Venous: 3.34 mmol/L — ABNORMAL HIGH (ref 0.5–2.2)

## 2013-11-19 MED ORDER — PRIMIDONE 50 MG PO TABS
100.0000 mg | ORAL_TABLET | Freq: Every day | ORAL | Status: DC
  Administered 2013-11-19 – 2013-11-21 (×3): 100 mg via ORAL
  Filled 2013-11-19 (×3): qty 2

## 2013-11-19 MED ORDER — SODIUM CHLORIDE 0.9 % IV SOLN
INTRAVENOUS | Status: DC
  Administered 2013-11-19 – 2013-11-20 (×2): via INTRAVENOUS

## 2013-11-19 MED ORDER — ONDANSETRON HCL 4 MG/2ML IJ SOLN: 4.0000 mg | Freq: Three times a day (TID) | INTRAMUSCULAR | Status: AC | PRN

## 2013-11-19 MED ORDER — DEXTROSE 5 % IV SOLN
1.0000 g | INTRAVENOUS | Status: DC
  Administered 2013-11-20: 1 g via INTRAVENOUS
  Filled 2013-11-19 (×3): qty 10

## 2013-11-19 MED ORDER — ONDANSETRON HCL 4 MG/2ML IJ SOLN
4.0000 mg | Freq: Once | INTRAMUSCULAR | Status: AC
  Administered 2013-11-19: 4 mg via INTRAVENOUS

## 2013-11-19 MED ORDER — ONDANSETRON HCL 4 MG/2ML IJ SOLN
4.0000 mg | Freq: Once | INTRAMUSCULAR | Status: DC
  Filled 2013-11-19: qty 2

## 2013-11-19 MED ORDER — DEXTROSE 5 % IV SOLN
500.0000 mg | Freq: Once | INTRAVENOUS | Status: AC
  Administered 2013-11-19: 500 mg via INTRAVENOUS

## 2013-11-19 MED ORDER — LITHIUM CARBONATE ER 450 MG PO TBCR
900.0000 mg | EXTENDED_RELEASE_TABLET | Freq: Every day | ORAL | Status: DC
  Administered 2013-11-19 – 2013-11-20 (×2): 900 mg via ORAL
  Filled 2013-11-19 (×3): qty 2

## 2013-11-19 MED ORDER — METHOCARBAMOL 500 MG PO TABS
1000.0000 mg | ORAL_TABLET | Freq: Three times a day (TID) | ORAL | Status: DC | PRN
  Administered 2013-11-19 – 2013-11-20 (×2): 1000 mg via ORAL
  Filled 2013-11-19 (×2): qty 2

## 2013-11-19 MED ORDER — GABAPENTIN 300 MG PO CAPS
300.0000 mg | ORAL_CAPSULE | Freq: Four times a day (QID) | ORAL | Status: DC
  Administered 2013-11-19 – 2013-11-21 (×8): 300 mg via ORAL
  Filled 2013-11-19 (×10): qty 1

## 2013-11-19 MED ORDER — ENOXAPARIN SODIUM 40 MG/0.4ML ~~LOC~~ SOLN
40.0000 mg | SUBCUTANEOUS | Status: DC
  Administered 2013-11-19 – 2013-11-20 (×2): 40 mg via SUBCUTANEOUS
  Filled 2013-11-19 (×3): qty 0.4

## 2013-11-19 MED ORDER — HYDROCODONE BITARTRATE ER 40 MG PO CP12: 40.0000 mg | ORAL_CAPSULE | Freq: Two times a day (BID) | ORAL | Status: DC

## 2013-11-19 MED ORDER — RISPERIDONE 2 MG PO TABS
4.0000 mg | ORAL_TABLET | Freq: Every day | ORAL | Status: DC
  Administered 2013-11-19 – 2013-11-21 (×3): 4 mg via ORAL
  Filled 2013-11-19 (×3): qty 2

## 2013-11-19 MED ORDER — CEFTRIAXONE SODIUM 1 G IJ SOLR
1.0000 g | INTRAMUSCULAR | Status: DC
  Administered 2013-11-19: 1 g via INTRAVENOUS
  Filled 2013-11-19 (×2): qty 10

## 2013-11-19 MED ORDER — NAPROXEN 375 MG PO TABS
375.0000 mg | ORAL_TABLET | Freq: Two times a day (BID) | ORAL | Status: DC
  Filled 2013-11-19 (×2): qty 1

## 2013-11-19 MED ORDER — HYDROMORPHONE HCL PF 1 MG/ML IJ SOLN
1.0000 mg | Freq: Once | INTRAMUSCULAR | Status: AC
  Administered 2013-11-19: 1 mg via INTRAVENOUS
  Filled 2013-11-19: qty 1

## 2013-11-19 MED ORDER — FAMOTIDINE 20 MG PO TABS
20.0000 mg | ORAL_TABLET | Freq: Two times a day (BID) | ORAL | Status: DC
  Administered 2013-11-19 – 2013-11-21 (×4): 20 mg via ORAL
  Filled 2013-11-19 (×5): qty 1

## 2013-11-19 MED ORDER — ASPIRIN EC 81 MG PO TBEC
81.0000 mg | DELAYED_RELEASE_TABLET | Freq: Every day | ORAL | Status: DC
  Administered 2013-11-19 – 2013-11-21 (×3): 81 mg via ORAL
  Filled 2013-11-19 (×3): qty 1

## 2013-11-19 MED ORDER — HYDROCODONE-ACETAMINOPHEN 10-325 MG PO TABS
1.0000 | ORAL_TABLET | Freq: Four times a day (QID) | ORAL | Status: DC | PRN
  Administered 2013-11-19 – 2013-11-21 (×3): 1 via ORAL
  Filled 2013-11-19 (×3): qty 1

## 2013-11-19 MED ORDER — SODIUM CHLORIDE 0.9 % IV SOLN
Freq: Once | INTRAVENOUS | Status: AC
  Administered 2013-11-19: 11:00:00 via INTRAVENOUS

## 2013-11-19 MED ORDER — CLONAZEPAM 0.5 MG PO TABS
0.5000 mg | ORAL_TABLET | Freq: Every day | ORAL | Status: DC
  Administered 2013-11-19 – 2013-11-21 (×3): 0.5 mg via ORAL
  Filled 2013-11-19 (×3): qty 1

## 2013-11-19 MED ORDER — TRAZODONE HCL 50 MG PO TABS
50.0000 mg | ORAL_TABLET | Freq: Every evening | ORAL | Status: DC | PRN
  Filled 2013-11-19: qty 1

## 2013-11-19 MED ORDER — DEXTROSE 5 % IV SOLN
500.0000 mg | INTRAVENOUS | Status: DC
  Administered 2013-11-20: 19:00:00 500 mg via INTRAVENOUS
  Filled 2013-11-19 (×3): qty 500

## 2013-11-19 MED ORDER — BUPROPION HCL ER (SR) 150 MG PO TB12
150.0000 mg | ORAL_TABLET | Freq: Two times a day (BID) | ORAL | Status: DC
  Administered 2013-11-19 – 2013-11-21 (×4): 150 mg via ORAL
  Filled 2013-11-19 (×5): qty 1

## 2013-11-19 NOTE — ED Provider Notes (Signed)
CSN: [id number]     Arrival date & time 11/19/13  1035 History   First MD Initiated Contact with Patient 11/19/13 1107     Chief Complaint  Patient presents with  . Altered Mental Status  . Fever   (Consider location/radiation/quality/duration/timing/severity/associated sxs/prior Treatment) Patient is a 62 y.o. male presenting with fever. The history is provided by the patient. No language interpreter was used.  Fever Max temp prior to arrival:  102 Temp source:  Oral Severity:  Severe Onset quality:  Sudden Duration:  1 day Timing:  Constant Progression:  Worsening Chronicity:  New Relieved by:  Nothing Worsened by:  Nothing tried Ineffective treatments:  Acetaminophen Associated symptoms: cough   Associated symptoms: no chest pain   Risk factors: sick contacts     Past Medical History  Diagnosis Date  . Arthritis   . Depression   . GERD (gastroesophageal reflux disease)   . Bipolar 1 disorder   . Prolapse, disk     lower back  . Depression    Past Surgical History  Procedure Laterality Date  . Appendectomy  1961  . Tonsillectomy  1976  . Inguinal hernia repair  1998    left   No family history on file. History  Substance Use Topics  . Smoking status: Former Games developer  . Smokeless tobacco: Never Used  . Alcohol Use: No    Review of Systems  Constitutional: Positive for fever.  Respiratory: Positive for cough and shortness of breath.   Cardiovascular: Negative for chest pain.  All other systems reviewed and are negative.    Allergies  Review of patient's allergies indicates no known allergies.  Home Medications   Current Outpatient Rx  Name  Route  Sig  Dispense  Refill  . aspirin EC 81 MG tablet   Oral   Take 81 mg by mouth daily.         Marland Kitchen buPROPion (WELLBUTRIN SR) 150 MG 12 hr tablet   Oral   Take 150 mg by mouth 2 (two) times daily.          . Calcium Carb-Cholecalciferol (CALCIUM 600 + D PO)   Oral   Take 1 tablet by mouth 2 (two)  times daily.         . clonazePAM (KLONOPIN) 1 MG tablet   Oral   Take 0.5 mg by mouth daily.         . fish oil-omega-3 fatty acids 1000 MG capsule   Oral   Take 3 g by mouth 2 (two) times daily.          Marland Kitchen gabapentin (NEURONTIN) 300 MG capsule   Oral   Take 300 mg by mouth 4 (four) times daily.         Marland Kitchen HYDROcodone Bitartrate ER 40 MG CP12   Oral   Take 40 mg by mouth 2 (two) times daily.         Marland Kitchen HYDROcodone-acetaminophen (NORCO) 10-325 MG per tablet   Oral   Take 1 tablet by mouth every 6 (six) hours as needed.         Marland Kitchen ketoconazole (NIZORAL) 2 % shampoo   Topical   Apply 1 application topically 3 (three) times a week.         . lithium carbonate (ESKALITH) 450 MG CR tablet   Oral   Take 900 mg by mouth at bedtime.         . methocarbamol (ROBAXIN) 500 MG tablet   Oral  Take 1,000 mg by mouth every 8 (eight) hours as needed for muscle spasms.         . naproxen (NAPROSYN) 375 MG tablet   Oral   Take 375 mg by mouth 2 (two) times daily with a meal.         . primidone (MYSOLINE) 50 MG tablet   Oral   Take 100 mg by mouth daily.          . ranitidine (ZANTAC) 150 MG tablet   Oral   Take 150 mg by mouth 2 (two) times daily.          . risperidone (RISPERDAL) 4 MG tablet   Oral   Take 4 mg by mouth daily.         . traZODone (DESYREL) 50 MG tablet   Oral   Take 50 mg by mouth at bedtime as needed for sleep.          BP 123/82  Pulse 103  Temp(Src) 102.9 F (39.4 C) (Rectal)  Resp 22  SpO2 93% Physical Exam  Nursing note and vitals reviewed. Constitutional: He is oriented to person, place, and time. He appears well-developed and well-nourished.  HENT:  Head: Normocephalic and atraumatic.  Eyes: Conjunctivae and EOM are normal. Pupils are equal, round, and reactive to light.  Neck: Normal range of motion. Neck supple.  Cardiovascular: Normal rate and normal heart sounds.   Pulmonary/Chest: Effort normal and breath  sounds normal.  Abdominal: Soft. Bowel sounds are normal.  Musculoskeletal: Normal range of motion.  Neurological: He is alert and oriented to person, place, and time. He has normal reflexes.  Skin: Skin is warm.  Psychiatric: He has a normal mood and affect.    ED Course  Procedures (including critical care time) Labs Review Labs Reviewed  CG4 I-STAT (LACTIC ACID) - Abnormal; Notable for the following:    Lactic Acid, Venous 3.34 (*)    All other components within normal limits  CULTURE, BLOOD (ROUTINE X 2)  CULTURE, BLOOD (ROUTINE X 2)  CBC WITH DIFFERENTIAL  COMPREHENSIVE METABOLIC PANEL  URINALYSIS, ROUTINE W REFLEX MICROSCOPIC   Imaging Review No results found.  EKG Interpretation   None     EKG  Sinus tach 109, nonspecific t changes,  Results for orders placed during the hospital encounter of 11/19/13  CBC WITH DIFFERENTIAL      Result Value Range   WBC 16.7 (*) 4.0 - 10.5 K/uL   RBC 4.21 (*) 4.22 - 5.81 MIL/uL   Hemoglobin 13.0  13.0 - 17.0 g/dL   HCT 16.1  09.6 - 04.5 %   MCV 94.5  78.0 - 100.0 fL   MCH 30.9  26.0 - 34.0 pg   MCHC 32.7  30.0 - 36.0 g/dL   RDW 40.9  81.1 - 91.4 %   Platelets 178  150 - 400 K/uL   Neutrophils Relative % 85 (*) 43 - 77 %   Neutro Abs 14.3 (*) 1.7 - 7.7 K/uL   Lymphocytes Relative 7 (*) 12 - 46 %   Lymphs Abs 1.1  0.7 - 4.0 K/uL   Monocytes Relative 6  3 - 12 %   Monocytes Absolute 1.0  0.1 - 1.0 K/uL   Eosinophils Relative 2  0 - 5 %   Eosinophils Absolute 0.4  0.0 - 0.7 K/uL   Basophils Relative 0  0 - 1 %   Basophils Absolute 0.0  0.0 - 0.1 K/uL  COMPREHENSIVE METABOLIC PANEL  Result Value Range   Sodium 136  135 - 145 mEq/L   Potassium 3.9  3.5 - 5.1 mEq/L   Chloride 104  96 - 112 mEq/L   CO2 21  19 - 32 mEq/L   Glucose, Bld 125 (*) 70 - 99 mg/dL   BUN 9  6 - 23 mg/dL   Creatinine, Ser 1.47  0.50 - 1.35 mg/dL   Calcium 8.1 (*) 8.4 - 10.5 mg/dL   Total Protein 6.3  6.0 - 8.3 g/dL   Albumin 3.1 (*) 3.5 - 5.2  g/dL   AST 15  0 - 37 U/L   ALT 10  0 - 53 U/L   Alkaline Phosphatase 51  39 - 117 U/L   Total Bilirubin 0.3  0.3 - 1.2 mg/dL   GFR calc non Af Amer 75 (*) >90 mL/min   GFR calc Af Amer 87 (*) >90 mL/min  URINALYSIS, ROUTINE W REFLEX MICROSCOPIC      Result Value Range   Color, Urine YELLOW  YELLOW   APPearance CLEAR  CLEAR   Specific Gravity, Urine 1.018  1.005 - 1.030   pH 6.5  5.0 - 8.0   Glucose, UA NEGATIVE  NEGATIVE mg/dL   Hgb urine dipstick NEGATIVE  NEGATIVE   Bilirubin Urine NEGATIVE  NEGATIVE   Ketones, ur NEGATIVE  NEGATIVE mg/dL   Protein, ur NEGATIVE  NEGATIVE mg/dL   Urobilinogen, UA 1.0  0.0 - 1.0 mg/dL   Nitrite NEGATIVE  NEGATIVE   Leukocytes, UA SMALL (*) NEGATIVE  URINE MICROSCOPIC-ADD ON      Result Value Range   Squamous Epithelial / LPF RARE  RARE   WBC, UA 3-6  <3 WBC/hpf  CG4 I-STAT (LACTIC ACID)      Result Value Range   Lactic Acid, Venous 3.34 (*) 0.5 - 2.2 mmol/L   Dg Chest 2 View  11/19/2013   CLINICAL DATA:  Altered mental status.  EXAM: CHEST  2 VIEW  COMPARISON:  Chest x-ray of September 25, 2013.  FINDINGS: The patient was unable to cooperate fully with positioning. The left lung is reasonably well inflated and clear. On the right there is increased density in the mid lung. There are coarse lung markings in the right infrahilar region medially as well. The cardiopericardial silhouette does not appear enlarged. The pulmonary vascularity is not engorged. There is no evidence of a pleural effusion. The observed portions of the bony thorax exhibit no acute abnormalities.  IMPRESSION: New increased density in the right mid lung and right infrahilar region suggests atelectasis or pneumonia. There is no evidence of CHF.   Electronically Signed   By: David  [place]   On: 11/19/2013 12:37    MDM Pt given IV Rocephin and Zithromax.   Pt complains of pain in his back.   Pt's family reports pt has been confused for several days.  Pt is seen at the [facility] hospital by  Dr. Siri Cole.  No local MD.     1. Community acquired pneumonia        Elson Areas, PA-C 11/19/13 1350

## 2013-11-19 NOTE — ED Notes (Signed)
Pt undressed, in gown, on monitor, continuous pulse oximetry, blood pressure cuff and oxygen [place] (2L) 

## 2013-11-19 NOTE — ED Notes (Signed)
TC  To wife  Per request of Patient. Pt looking for a watch and chain. Pt wife [name] confirmed that the watch and chain were removed at home before Pt left with EMS for Hospital. [name] reported she had the mentioned items.

## 2013-11-19 NOTE — H&P (Signed)
History and Physical       Hospital Admission Note Date: 11/19/2013  Patient name: Alan Brown Medical record number: [id number] Date of birth: 1951-01-30 Age: 62 y.o. Gender: male  PCP: Dr. Siri Cole at the [facility]    Chief Complaint:  Fevers, increasing generalized weakness, confusion  HPI: Patient is a 62 year old male with history of bipolar disorder, hyperlipidemia, chronic back pain, GERD, depression presented to the ER with worsening generalized weakness. History is obtained from the patient who is apparently somewhat confused at the time of arrival to the ER. No family member is present in the room. Patient reports that his brother-in-law had died last week and he was very close to him. He has not been eating or drinking well. He states that he's not having any coughing or chest tightness or shortness of breath or wheezing. He just felt very weak today, couldn't get out of bed and his wife also could not lift him up. Denied any focal weakness. In the ER, patient was noted to have temp of 102.9, O2 sats 88 -89% on room air, leukocytosis 16.7 with left shift, lactic acid of 3.34. Chest x-ray showed right lung pneumonia.  Review of Systems:  Constitutional: + fever, chills, diaphoresis, poor appetite and fatigue.  HEENT: Denies photophobia, eye pain, redness, hearing loss, ear pain, congestion, sore throat, rhinorrhea, sneezing, mouth sores, trouble swallowing, neck pain, neck stiffness and tinnitus.   Respiratory: Denies SOB, DOE, cough, chest tightness,  and wheezing.   Cardiovascular: Denies chest pain, palpitations and leg swelling.  Gastrointestinal: Denies nausea, vomiting, abdominal pain, diarrhea, constipation, blood in stool and abdominal distention.  Genitourinary: Denies dysuria, urgency, frequency, hematuria, flank pain and difficulty urinating.  Musculoskeletal:  + Myalgias, chronic back pain+   Skin: Denies pallor, rash and wound.  Neurological: + Increasing generalized weakness but no syncope, seizure or any headaches Hematological: Denies adenopathy. Easy bruising, personal or family bleeding history  Psychiatric/Behavioral: Denies suicidal ideation, mood changes, confusion, nervousness, sleep disturbance and agitation  Past Medical History: Past Medical History  Diagnosis Date  . Arthritis   . Depression   . GERD (gastroesophageal reflux disease)   . Bipolar 1 disorder   . Prolapse, disk     lower back  . Depression    Past Surgical History  Procedure Laterality Date  . Appendectomy  1961  . Tonsillectomy  1976  . Inguinal hernia repair  1998    left    Medications: Prior to Admission medications   Medication Sig Start Date End Date Taking? Authorizing Provider  aspirin EC 81 MG tablet Take 81 mg by mouth daily.   Yes Historical Provider, MD  buPROPion (WELLBUTRIN SR) 150 MG 12 hr tablet Take 150 mg by mouth 2 (two) times daily.    Yes Historical Provider, MD  Calcium Carb-Cholecalciferol (CALCIUM 600 + D PO) Take 1 tablet by mouth 2 (two) times daily.   Yes Historical Provider, MD  clonazePAM (KLONOPIN) 1 MG tablet Take 0.5 mg by mouth daily.   Yes Historical Provider, MD  fish oil-omega-3 fatty acids 1000 MG capsule Take 3 g by mouth 2 (two) times daily.    Yes Historical Provider, MD  gabapentin (NEURONTIN) 300 MG capsule Take 300 mg by mouth 4 (four) times daily.   Yes Historical Provider, MD  HYDROcodone Bitartrate ER 40 MG CP12 Take 40 mg by mouth 2 (two) times daily.   Yes Historical Provider, MD  HYDROcodone-acetaminophen (NORCO) 10-325 MG per tablet Take 1 tablet  by mouth every 6 (six) hours as needed.   Yes Historical Provider, MD  ketoconazole (NIZORAL) 2 % shampoo Apply 1 application topically 3 (three) times a week.   Yes Historical Provider, MD  lithium carbonate (ESKALITH) 450 MG CR tablet Take 900 mg by mouth at bedtime.   Yes Historical Provider, MD   methocarbamol (ROBAXIN) 500 MG tablet Take 1,000 mg by mouth every 8 (eight) hours as needed for muscle spasms.   Yes Historical Provider, MD  naproxen (NAPROSYN) 375 MG tablet Take 375 mg by mouth 2 (two) times daily with a meal.   Yes Historical Provider, MD  primidone (MYSOLINE) 50 MG tablet Take 100 mg by mouth daily.    Yes Historical Provider, MD  ranitidine (ZANTAC) 150 MG tablet Take 150 mg by mouth 2 (two) times daily.    Yes Historical Provider, MD  risperidone (RISPERDAL) 4 MG tablet Take 4 mg by mouth daily.   Yes Historical Provider, MD  traZODone (DESYREL) 50 MG tablet Take 50 mg by mouth at bedtime as needed for sleep.   Yes Historical Provider, MD    Allergies:  No Known Allergies  Social History:  reports that he has quit smoking. He has never used smokeless tobacco. He reports that he does not drink alcohol or use illicit drugs.He lives at home and is otherwise functional with his ADLs  Family History: No family history on file.  Physical Exam: Blood pressure 123/82, pulse 103, temperature 102.9 F (39.4 C), temperature source Rectal, resp. rate 22, SpO2 93.00%. General: Alert, awake, oriented x3, in no acute distress, Looks sick and depressed. HEENT: normocephalic, atraumatic, anicteric sclera, pink conjunctiva, pupils equal and reactive to light and accomodation, oropharynx clear Neck: supple, no masses or lymphadenopathy, no goiter, no bruits  Heart: Regular rate and rhythm, without murmurs, rubs or gallops. Lungs: Clear to auscultation bilaterally, no wheezing, rales or rhonchi. Abdomen: Soft, nontender, nondistended, positive bowel sounds, no masses. Extremities: No clubbing, cyanosis or edema with positive pedal pulses. Neuro: Grossly intact, no focal neurological deficits, strength 5/5 upper and lower extremities bilaterally Psych: alert and oriented x 3, normal mood and affect Skin: no rashes or lesions, warm and dry   LABS on Admission:  Basic Metabolic  Panel:  Recent Labs Lab 11/19/13 1215  NA 136  K 3.9  CL 104  CO2 21  GLUCOSE 125*  BUN 9  CREATININE 1.03  CALCIUM 8.1*   Liver Function Tests:  Recent Labs Lab 11/19/13 1215  AST 15  ALT 10  ALKPHOS 51  BILITOT 0.3  PROT 6.3  ALBUMIN 3.1*   No results found for this basename: LIPASE, AMYLASE,  in the last 168 hours No results found for this basename: AMMONIA,  in the last 168 hours CBC:  Recent Labs Lab 11/19/13 1215  WBC 16.7*  NEUTROABS 14.3*  HGB 13.0  HCT 39.8  MCV 94.5  PLT 178   Cardiac Enzymes: No results found for this basename: CKTOTAL, CKMB, CKMBINDEX, TROPONINI,  in the last 168 hours BNP: No components found with this basename: POCBNP,  CBG: No results found for this basename: GLUCAP,  in the last 168 hours   Radiological Exams on Admission: Dg Chest 2 View  11/19/2013   CLINICAL DATA:  Altered mental status.  EXAM: CHEST  2 VIEW  COMPARISON:  Chest x-ray of September 25, 2013.  FINDINGS: The patient was unable to cooperate fully with positioning. The left lung is reasonably well inflated and clear. On the right  there is increased density in the mid lung. There are coarse lung markings in the right infrahilar region medially as well. The cardiopericardial silhouette does not appear enlarged. The pulmonary vascularity is not engorged. There is no evidence of a pleural effusion. The observed portions of the bony thorax exhibit no acute abnormalities.  IMPRESSION: New increased density in the right mid lung and right infrahilar region suggests atelectasis or pneumonia. There is no evidence of CHF.   Electronically Signed   By: David  [place]   On: 11/19/2013 12:37    Assessment/Plan Principal Problem:   SIRS (systemic inflammatory response syndrome): Likely due to community-acquired pneumonia, lactic acidosis, dehydration - Patient is currently hemodynamically stable and will be admitted, placed on IV Rocephin, Zithromax - Blood cultures has been  obtained, will check flu PCR, obtain urine Legionella antigen, urine strep antigen - Continue aggressive IV fluid hydration for 2 L and recheck lactic acid, continue IV fluid after that, patient has not been eating well   Active Problems:   Acute encephalopathy: Improving, patient was able to provide history     GERD (gastroesophageal reflux disease) - Continue Pepcid twice a day    Bipolar 1 disorder - Continue all psych medications  Chronic back pain - Will continue Robaxin, Neurontin, Norco, long-acting hydrocodone   DVT prophylaxis: Lovenox  CODE STATUS: Discussed in detail with the patient and he opted to be full CODE STATUS   Family Communication: Admission, patients condition and plan of care including tests being ordered have been discussed with the patient who indicates understanding and agree with the plan and Code Status   Further plan will depend as patient's clinical course evolves and further radiologic and laboratory data become available.   Time Spent on Admission: 1 hour  Jahson Emanuele M.D. [facility] 11/19/2013, 2:04 PM Pager: [phone]  If 7PM-7AM, please contact night-coverage [email or web address] Password [email or web address]

## 2013-11-19 NOTE — ED Notes (Signed)
DR.Beaton shown results of POC Lactic Acid. ED-LAb

## 2013-11-19 NOTE — Progress Notes (Signed)
NURSING PROGRESS NOTE  Branden Shallenberger [id number] Admission Data: 11/19/2013 7:54 PM Attending Provider: Cathren Harsh, MD PCP:No primary provider on file. Code Status: Full   Scotland Korver is a 62 y.o. male patient admitted from ED:  -No acute distress noted.  -No complaints of shortness of breath.  -No complaints of chest pain.   Cardiac Monitoring: Box # 20 in place. Cardiac monitor yields:normal sinus rhythm.  Blood pressure 111/67, pulse 74, temperature 98.7 F (37.1 C), temperature source Oral, resp. rate 18, height 5\' 10"  (1.778 m), weight 103.4 kg (227 lb 15.3 oz), SpO2 95.00%.   IV Fluids:  IV in place, occlusive dsg intact without redness, IV cath hand right and left AC, condition patent and no redness normal saline.   Allergies:  Review of patient's allergies indicates no known allergies.  Past Medical History:   has a past medical history of Arthritis; Depression; GERD (gastroesophageal reflux disease); Bipolar 1 disorder; Prolapse, disk; and Depression.  Past Surgical History:   has past surgical history that includes Appendectomy (1961); Tonsillectomy (1976); and Inguinal hernia repair (1998).  Social History:   reports that he has quit smoking. He has never used smokeless tobacco. He reports that he does not drink alcohol or use illicit drugs.  Skin: Intact, but has several tattoos.  Patient/Family orientated to room. Information packet given to patient/family. Admission inpatient armband information verified with patient/family to include name and date of birth and placed on patient arm. Side rails up x 2, fall assessment and education completed with patient/family. Patient/family able to verbalize understanding of risk associated with falls and verbalized understanding to call for assistance before getting out of bed. Call light within reach. Patient/family able to voice and demonstrate understanding of unit orientation instructions.    Will continue  to evaluate and treat per MD orders.

## 2013-11-19 NOTE — Progress Notes (Signed)
Called ED to get report on patient.  Nurse unavailable at this time.  Will try again later.

## 2013-11-19 NOTE — ED Notes (Addendum)
Family reports a several day HX of confusion and a fever today PT received 1000mg  tylenol from EMS STAFF . Pt has a hx of TIA's . Personal stressor triggers TIA symptoms . Pt brother died last week  And wife thinks that is why Pt is confused. Pt is also being treated for Depression.

## 2013-11-20 ENCOUNTER — Inpatient Hospital Stay (HOSPITAL_COMMUNITY): Payer: Non-veteran care

## 2013-11-20 DIAGNOSIS — R6510 Systemic inflammatory response syndrome (SIRS) of non-infectious origin without acute organ dysfunction: Secondary | ICD-10-CM

## 2013-11-20 LAB — BASIC METABOLIC PANEL
BUN: 9 mg/dL (ref 6–23)
CO2: 26 mEq/L (ref 19–32)
Chloride: 106 mEq/L (ref 96–112)
GFR calc Af Amer: >90 mL/min (ref >90)
GFR calc non Af Amer: 85 mL/min — ABNORMAL LOW (ref >90)
Glucose, Bld: 92 mg/dL (ref 70–99)
Potassium: 3.7 mEq/L (ref 3.5–5.1)

## 2013-11-20 LAB — CBC
HCT: 36.7 % — ABNORMAL LOW (ref 39.0–52.0)
Hemoglobin: 12 g/dL — ABNORMAL LOW (ref 13.0–17.0)
MCH: 31.3 pg (ref 26.0–34.0)
MCHC: 32.7 g/dL (ref 30.0–36.0)
MCV: 95.8 fL (ref 78.0–100.0)
RBC: 3.83 MIL/uL — ABNORMAL LOW (ref 4.22–5.81)

## 2013-11-20 LAB — LEGIONELLA ANTIGEN, URINE

## 2013-11-20 LAB — URINALYSIS, ROUTINE W REFLEX MICROSCOPIC
Glucose, UA: NEGATIVE mg/dL
Hgb urine dipstick: NEGATIVE
Ketones, ur: NEGATIVE mg/dL
Leukocytes, UA: NEGATIVE
Specific Gravity, Urine: 1.01 (ref 1.005–1.030)
Urobilinogen, UA: 1 mg/dL (ref 0.0–1.0)

## 2013-11-20 LAB — HIV ANTIBODY (ROUTINE TESTING W REFLEX): HIV: NONREACTIVE

## 2013-11-20 MED ORDER — SODIUM CHLORIDE 0.9 % IV SOLN
INTRAVENOUS | Status: DC
  Administered 2013-11-21: 02:00:00 via INTRAVENOUS

## 2013-11-20 NOTE — Progress Notes (Signed)
[facility] PROGRESS NOTE  Alan Brown ZOX:[id number] DOB: [birth year] DOA: 11/19/2013 PCP: No primary provider on file.  Assessment/Plan: 1. Community Acquired PNA. Patient presenting with signs and symptoms consistent with PNA. Presenting CXR showing new opacity in the right mid lung region. Will continue emperic IV AB therapy with Ceftriaxone and Azithromycin. Blood cultures showing no growth thus far. Continue supportive care.  2. Sepsis/SIRS. Evidenced by elevated white count of 16.7, lactate 3.34, acute encephalopathy. CXR showing right lung infiltrate. ON IV emeperic AB therapy. Continue supportive care, IV fluids, cultures negative on 11/20/13.  3. Acute Encephalopathy. Likely secondary to underlying infectious process. Improving this morning.  4. Bipolar disorder. On lithium and risperdal therapy.  5. DVT prophylaxis. Lovenox.   Code Status: Full Code Disposition Plan: Continue IV AB's    Antibiotics:  Cefriaxone (started 11/19/13)   Azithromycin (started 11/19/13)  HPI/Subjective: Patient is a pleasant 62 y/o male with a PMH of bipolar disorder, on lithium therapy, who was admitted on 11/19/13, after presenting with complaints of generalized weakness, mental status changes and mental status changes. Initial CXR shows new density in right lung and right infrahilar region. He was admitted to medicine started on emperic IV antibiotic therapy with Azithromycin and Rocephin. This morning he reports feeling ill, however does not feel as bad as on admission. His white is trending down to 14.5. Tolerating PO intake, afebrile and hemodynamically stable.   Objective: Filed Vitals:   11/20/13 0500  BP: 125/76  Pulse: 81  Temp: 99.7 F (37.6 C)  Resp: 18    Intake/Output Summary (Last 24 hours) at 11/20/13 1349 Last data filed at 11/20/13 0700  Gross per 24 hour  Intake 1441.67 ml  Output   2300 ml  Net -858.33 ml   Filed Weights   11/19/13 1627  Weight: 103.4 kg  (227 lb 15.3 oz)    Exam:   General:  No acute distress, reports feeling a little better today.   Cardiovascular: Regular rate and rhythm, normal S1S2  Respiratory: Has right sided rhonchi and crackles.   Abdomen: Soft, nontender, nondistended  Musculoskeletal: no edema  Data Reviewed: Basic Metabolic Panel:  Recent Labs Lab 11/19/13 1215 11/20/13 0152  NA 136 139  K 3.9 3.7  CL 104 106  CO2 21 26  GLUCOSE 125* 92  BUN 9 9  CREATININE 1.03 0.99  CALCIUM 8.1* 8.2*   Liver Function Tests:  Recent Labs Lab 11/19/13 1215  AST 15  ALT 10  ALKPHOS 51  BILITOT 0.3  PROT 6.3  ALBUMIN 3.1*   No results found for this basename: LIPASE, AMYLASE,  in the last 168 hours No results found for this basename: AMMONIA,  in the last 168 hours CBC:  Recent Labs Lab 11/19/13 1215 11/20/13 0152  WBC 16.7* 14.5*  NEUTROABS 14.3*  --   HGB 13.0 12.0*  HCT 39.8 36.7*  MCV 94.5 95.8  PLT 178 157   Cardiac Enzymes: No results found for this basename: CKTOTAL, CKMB, CKMBINDEX, TROPONINI,  in the last 168 hours BNP (last 3 results) No results found for this basename: PROBNP,  in the last 8760 hours CBG: No results found for this basename: GLUCAP,  in the last 168 hours  Recent Results (from the past 240 hour(s))  CULTURE, BLOOD (ROUTINE X 2)     Status: None   Collection Time    11/19/13 11:45 AM      Result Value Range Status   Specimen Description BLOOD LEFT HAND  Final   Special Requests BOTTLES DRAWN AEROBIC AND ANAEROBIC 10CC   Final   Culture  Setup Time     Final   Value: 11/19/2013 17:45     Performed at [facility]   Culture     Final   Value:        BLOOD CULTURE RECEIVED NO GROWTH TO DATE CULTURE WILL BE HELD FOR 5 DAYS BEFORE ISSUING A FINAL NEGATIVE REPORT     Performed at [facility]   Report Status PENDING   Incomplete  CULTURE, BLOOD (ROUTINE X 2)     Status: None   Collection Time    11/19/13 12:15 PM      Result Value Range  Status   Specimen Description BLOOD RIGHT ARM   Final   Special Requests BOTTLES DRAWN AEROBIC AND ANAEROBIC 10CC   Final   Culture  Setup Time     Final   Value: 11/19/2013 17:45     Performed at [facility]   Culture     Final   Value:        BLOOD CULTURE RECEIVED NO GROWTH TO DATE CULTURE WILL BE HELD FOR 5 DAYS BEFORE ISSUING A FINAL NEGATIVE REPORT     Performed at [facility]   Report Status PENDING   Incomplete     Studies: Dg Chest 2 View  11/19/2013   CLINICAL DATA:  Altered mental status.  EXAM: CHEST  2 VIEW  COMPARISON:  Chest x-ray of September 25, 2013.  FINDINGS: The patient was unable to cooperate fully with positioning. The left lung is reasonably well inflated and clear. On the right there is increased density in the mid lung. There are coarse lung markings in the right infrahilar region medially as well. The cardiopericardial silhouette does not appear enlarged. The pulmonary vascularity is not engorged. There is no evidence of a pleural effusion. The observed portions of the bony thorax exhibit no acute abnormalities.  IMPRESSION: New increased density in the right mid lung and right infrahilar region suggests atelectasis or pneumonia. There is no evidence of CHF.   Electronically Signed   By: David  [place]   On: 11/19/2013 12:37    Scheduled Meds: . aspirin EC  81 mg Oral Daily  . azithromycin  500 mg Intravenous Q24H  . buPROPion  150 mg Oral BID  . cefTRIAXone (ROCEPHIN)  IV  1 g Intravenous Q24H  . clonazePAM  0.5 mg Oral Daily  . enoxaparin (LOVENOX) injection  40 mg Subcutaneous Q24H  . famotidine  20 mg Oral BID  . gabapentin  300 mg Oral QID  . lithium carbonate  900 mg Oral QHS  . primidone  100 mg Oral Daily  . risperidone  4 mg Oral Daily   Continuous Infusions: . sodium chloride 75 mL/hr at 11/20/13 0911    Principal Problem:   SIRS (systemic inflammatory response syndrome) Active Problems:   Acute encephalopathy   Community  acquired pneumonia   GERD (gastroesophageal reflux disease)   Bipolar 1 disorder   Lactic acidosis   Pneumonia    Time spent: 35 minutes    Jeralyn Bennett  [facility] Pager [phone]. If 7PM-7AM, please contact night-coverage at [email or web address], password [email or web address] 11/20/2013, 1:49 PM  LOS: 1 day

## 2013-11-20 NOTE — ED Provider Notes (Signed)
Medical screening examination/treatment/procedure(s) were performed by non-physician practitioner and as supervising physician I was immediately available for consultation/collaboration.    Stephaine Breshears L Genna Casimir, MD 11/20/13 0739 

## 2013-11-21 LAB — CBC
HCT: 37.8 % — ABNORMAL LOW (ref 39.0–52.0)
Hemoglobin: 12.3 g/dL — ABNORMAL LOW (ref 13.0–17.0)
MCH: 30.9 pg (ref 26.0–34.0)
MCHC: 32.5 g/dL (ref 30.0–36.0)
RBC: 3.98 MIL/uL — ABNORMAL LOW (ref 4.22–5.81)
WBC: 9.8 10*3/uL (ref 4.0–10.5)

## 2013-11-21 LAB — URINE CULTURE: Colony Count: NO GROWTH

## 2013-11-21 LAB — BASIC METABOLIC PANEL
BUN: 9 mg/dL (ref 6–23)
Chloride: 109 mEq/L (ref 96–112)
GFR calc Af Amer: 87 mL/min — ABNORMAL LOW (ref >90)
GFR calc non Af Amer: 75 mL/min — ABNORMAL LOW (ref >90)
Potassium: 3.7 mEq/L (ref 3.5–5.1)
Sodium: 141 mEq/L (ref 135–145)

## 2013-11-21 MED ORDER — AZITHROMYCIN 500 MG PO TABS
500.0000 mg | ORAL_TABLET | ORAL | Status: DC
  Filled 2013-11-21: qty 1

## 2013-11-21 MED ORDER — LEVOFLOXACIN 750 MG PO TABS
750.0000 mg | ORAL_TABLET | Freq: Every day | ORAL | Status: DC
  Administered 2013-11-21: 750 mg via ORAL
  Filled 2013-11-21: qty 1

## 2013-11-21 MED ORDER — LEVOFLOXACIN 750 MG PO TABS: 750.0000 mg | ORAL_TABLET | Freq: Every day | ORAL | Status: DC

## 2013-11-21 NOTE — Progress Notes (Signed)
Alan Brown to be D/C'd Home per MD order.  Discussed with the patient and all questions fully answered.    Medication List         aspirin EC 81 MG tablet  Take 81 mg by mouth daily.     buPROPion 150 MG 12 hr tablet  Commonly known as:  WELLBUTRIN SR  Take 150 mg by mouth 2 (two) times daily.     CALCIUM 600 + D PO  Take 1 tablet by mouth 2 (two) times daily.     clonazePAM 1 MG tablet  Commonly known as:  KLONOPIN  Take 0.5 mg by mouth daily.     fish oil-omega-3 fatty acids 1000 MG capsule  Take 3 g by mouth 2 (two) times daily.     gabapentin 300 MG capsule  Commonly known as:  NEURONTIN  Take 300 mg by mouth 4 (four) times daily.     HYDROcodone Bitartrate ER 40 MG Cp12  Take 40 mg by mouth 2 (two) times daily.     HYDROcodone-acetaminophen 10-325 MG per tablet  Commonly known as:  NORCO  Take 1 tablet by mouth every 6 (six) hours as needed.     ketoconazole 2 % shampoo  Commonly known as:  NIZORAL  Apply 1 application topically 3 (three) times a week.     levofloxacin 750 MG tablet  Commonly known as:  LEVAQUIN  Take 1 tablet (750 mg total) by mouth daily.     lithium carbonate 450 MG CR tablet  Commonly known as:  ESKALITH  Take 900 mg by mouth at bedtime.     methocarbamol 500 MG tablet  Commonly known as:  ROBAXIN  Take 1,000 mg by mouth every 8 (eight) hours as needed for muscle spasms.     naproxen 375 MG tablet  Commonly known as:  NAPROSYN  Take 375 mg by mouth 2 (two) times daily with a meal.     primidone 50 MG tablet  Commonly known as:  MYSOLINE  Take 100 mg by mouth daily.     ranitidine 150 MG tablet  Commonly known as:  ZANTAC  Take 150 mg by mouth 2 (two) times daily.     risperidone 4 MG tablet  Commonly known as:  RISPERDAL  Take 4 mg by mouth daily.     traZODone 50 MG tablet  Commonly known as:  DESYREL  Take 50 mg by mouth at bedtime as needed for sleep.        VVS, Skin clean, dry and intact without  evidence of skin break down, no evidence of skin tears noted. IV catheter discontinued intact. Site without signs and symptoms of complications. Dressing and pressure applied.  An After Visit Summary was printed and given to the patient.  D/c education completed with patient/family including follow up instructions, medication list, d/c activities limitations if indicated, with other d/c instructions as indicated by MD - patient able to verbalize understanding, all questions fully answered.   Patient instructed to return to ED, call 911, or call MD for any changes in condition.   Patient escorted via WC, and D/C home with cane via private auto.  Kennyth Arnold D 11/21/2013 1:39 PM

## 2013-11-21 NOTE — Discharge Summary (Addendum)
Physician Discharge Summary  Alan Brown ZOX:[id number] DOB: 1951-11-15 DOA: 11/19/2013  PCP: Lupe Carney, MD  Admit date: 11/19/2013 Discharge date: 11/21/2013  Time spent: 40 minutes.  Recommendations for Outpatient Follow-up:  Lithium level and BMET in 1 week.  Patient with metabolic acidosis and sub therapuetic lithium level on admission.   Discharge Diagnoses:  Principal Problem:   SIRS (systemic inflammatory response syndrome) Active Problems:   Acute encephalopathy   Community acquired pneumonia   GERD (gastroesophageal reflux disease)   Bipolar 1 disorder   Lactic acidosis   Pneumonia   Discharge Condition: stable, A&O, ambulating with a walker  Diet recommendation: regular heart healthy  Filed Weights   11/19/13 1627  Weight: 103.4 kg (227 lb 15.3 oz)    History of present illness at the time of admission:  Patient is a 62 year old male with history of bipolar disorder, hyperlipidemia, chronic back pain, GERD, depression presented to the ER with worsening generalized weakness. History is obtained from the patient who is apparently somewhat confused at the time of arrival to the ER.  Patient reports that his brother-in-law had died last week and he was very close to him. He has not been eating or drinking well. He states that he's not having any coughing or chest tightness or shortness of breath or wheezing.   In the ER, patient was noted to have temp of 102.9, O2 sats 88 -89% on room air, leukocytosis 16.7 with left shift, lactic acid of 3.34. Chest x-ray showed right lung pneumonia.   Hospital Course:  Community Acquired PNA. Patient presenting with signs and symptoms consistent with PNA.  Blood cultures show no growth to date, Urine for legionella and Strep are negative, HIV non-reactive. Presenting CXR showing new opacity in the right mid lung region.  Will discharge him on oral levaquin.     Sepsis/SIRS.  Evidenced by elevated white count of  16.7, lactate 3.34, acute encephalopathy.  Blood cultures show no growth to date.  Urine culture is negative. In addition to IV antibiotic therapy the patient was provided supportive care and improved. On 12/8 he is able to walk the hallway with assistance with no difficulty breathing.    Acute Encephalopathy against the setting of Bipolar Disorder Resolved.  Likely secondary to underlying infectious process.  On lithium and risperdal therapy.  These have been continued.  Will request PCP monitor lithium level and adjust as needed.   Discharge Exam: Filed Vitals:   11/21/13 0401  BP: 124/67  Pulse: 74  Temp: 98 F (36.7 C)  Resp: 20    General: A&O, NAD, ambulating well with assistance Cardiovascular: rrr no m/r/g Respiratory: cta, no w/c/r, no accessory muscle use Abdomen:  Soft, nt, nd, +bs, no masses Extremities:  Colorful tattoos on upper extremities, 5/5 strength in each.  Discharge Instructions      Discharge Orders   Future Orders Complete By Expires   Diet - low sodium heart healthy  As directed    Increase activity slowly  As directed        Medication List         aspirin EC 81 MG tablet  Take 81 mg by mouth daily.     buPROPion 150 MG 12 hr tablet  Commonly known as:  WELLBUTRIN SR  Take 150 mg by mouth 2 (two) times daily.     CALCIUM 600 + D PO  Take 1 tablet by mouth 2 (two) times daily.     clonazePAM 1 MG tablet  Commonly known as:  KLONOPIN  Take 0.5 mg by mouth daily.     fish oil-omega-3 fatty acids 1000 MG capsule  Take 3 g by mouth 2 (two) times daily.     gabapentin 300 MG capsule  Commonly known as:  NEURONTIN  Take 300 mg by mouth 4 (four) times daily.     HYDROcodone Bitartrate ER 40 MG Cp12  Take 40 mg by mouth 2 (two) times daily.     HYDROcodone-acetaminophen 10-325 MG per tablet  Commonly known as:  NORCO  Take 1 tablet by mouth every 6 (six) hours as needed.     ketoconazole 2 % shampoo  Commonly known as:  NIZORAL   Apply 1 application topically 3 (three) times a week.     levofloxacin 750 MG tablet  Commonly known as:  LEVAQUIN  Take 1 tablet (750 mg total) by mouth daily.     lithium carbonate 450 MG CR tablet  Commonly known as:  ESKALITH  Take 900 mg by mouth at bedtime.     methocarbamol 500 MG tablet  Commonly known as:  ROBAXIN  Take 1,000 mg by mouth every 8 (eight) hours as needed for muscle spasms.     naproxen 375 MG tablet  Commonly known as:  NAPROSYN  Take 375 mg by mouth 2 (two) times daily with a meal.     primidone 50 MG tablet  Commonly known as:  MYSOLINE  Take 100 mg by mouth daily.     ranitidine 150 MG tablet  Commonly known as:  ZANTAC  Take 150 mg by mouth 2 (two) times daily.     risperidone 4 MG tablet  Commonly known as:  RISPERDAL  Take 4 mg by mouth daily.     traZODone 50 MG tablet  Commonly known as:  DESYREL  Take 50 mg by mouth at bedtime as needed for sleep.       Allergies  Allergen Reactions  . Naproxen Other (See Comments)    Increases lithium toxicity.    Follow-up Information   Follow up with [facility], MD. Schedule an appointment as soon as possible for a visit in 1 week.   Specialty:  Family Medicine   Contact information:   [address] [place] [address] [phone]        The results of significant diagnostics from this hospitalization (including imaging, microbiology, ancillary and laboratory) are listed below for reference.    Significant Diagnostic Studies: Dg Chest 2 View  11/21/2013   CLINICAL DATA:  Chest pain, weakness and shortness of breath.  EXAM: CHEST  2 VIEW  COMPARISON:  Chest radiograph performed 11/19/2013  FINDINGS: The lungs are mildly hypoexpanded. Previously noted airspace opacity at the right midlung zone is less well characterized due to overlying soft tissues, but mild residual opacity is likely still present. No pleural effusion or pneumothorax is seen.  The heart is borderline  normal in size; the mediastinal contour is within normal limits. No acute osseous abnormalities are seen.  IMPRESSION: Lungs mildly hypoexpanded. Previously noted airspace opacity at the right mid lung zone is less well characterized due to overlying soft tissues, but residual opacity is likely still present, reflecting atelectasis or pneumonia.   Electronically Signed   By: Roanna Raider M.D.   On: 11/21/2013 00:21   Dg Chest 2 View  11/19/2013   CLINICAL DATA:  Altered mental status.  EXAM: CHEST  2 VIEW  COMPARISON:  Chest x-ray of September 25, 2013.  FINDINGS: The patient was unable to cooperate fully with positioning. The left lung is reasonably well inflated and clear. On the right there is increased density in the mid lung. There are coarse lung markings in the right infrahilar region medially as well. The cardiopericardial silhouette does not appear enlarged. The pulmonary vascularity is not engorged. There is no evidence of a pleural effusion. The observed portions of the bony thorax exhibit no acute abnormalities.  IMPRESSION: New increased density in the right mid lung and right infrahilar region suggests atelectasis or pneumonia. There is no evidence of CHF.   Electronically Signed   By: David  [place]   On: 11/19/2013 12:37    Microbiology: Recent Results (from the past 240 hour(s))  CULTURE, BLOOD (ROUTINE X 2)     Status: None   Collection Time    11/19/13 11:45 AM      Result Value Range Status   Specimen Description BLOOD LEFT HAND   Final   Special Requests BOTTLES DRAWN AEROBIC AND ANAEROBIC 10CC   Final   Culture  Setup Time     Final   Value: 11/19/2013 17:45     Performed at [facility]   Culture     Final   Value:        BLOOD CULTURE RECEIVED NO GROWTH TO DATE CULTURE WILL BE HELD FOR 5 DAYS BEFORE ISSUING A FINAL NEGATIVE REPORT     Performed at [facility]   Report Status PENDING   Incomplete  CULTURE, BLOOD (ROUTINE X 2)     Status: None   Collection  Time    11/19/13 12:15 PM      Result Value Range Status   Specimen Description BLOOD RIGHT ARM   Final   Special Requests BOTTLES DRAWN AEROBIC AND ANAEROBIC 10CC   Final   Culture  Setup Time     Final   Value: 11/19/2013 17:45     Performed at [facility]   Culture     Final   Value:        BLOOD CULTURE RECEIVED NO GROWTH TO DATE CULTURE WILL BE HELD FOR 5 DAYS BEFORE ISSUING A FINAL NEGATIVE REPORT     Performed at [facility]   Report Status PENDING   Incomplete  URINE CULTURE     Status: None   Collection Time    11/19/13  5:23 PM      Result Value Range Status   Specimen Description URINE, RANDOM   Final   Special Requests ADDED 2015   Final   Culture  Setup Time     Final   Value: 11/20/2013 05:10     Performed at [facility] Count     Final   Value: NO GROWTH     Performed at [facility]   Culture     Final   Value: NO GROWTH     Performed at [facility]   Report Status 11/21/2013 FINAL   Final     Labs: Basic Metabolic Panel:  Recent Labs Lab 11/19/13 1215 11/20/13 0152 11/21/13 0610  NA 136 139 141  K 3.9 3.7 3.7  CL 104 106 109  CO2 21 26 24   GLUCOSE 125* 92 116*  BUN 9 9 9   CREATININE 1.03 0.99 1.03  CALCIUM 8.1* 8.2* 8.9   Liver Function Tests:  Recent Labs Lab 11/19/13 1215  AST 15  ALT 10  ALKPHOS 51  BILITOT 0.3  PROT 6.3  ALBUMIN 3.1*   CBC:  Recent Labs Lab 11/19/13 1215 11/20/13 0152 11/21/13 0610  WBC 16.7* 14.5* 9.8  NEUTROABS 14.3*  --   --   HGB 13.0 12.0* 12.3*  HCT 39.8 36.7* 37.8*  MCV 94.5 95.8 95.0  PLT 178 157 185     Signed:  Conley Canal [phone]  [facility] 11/21/2013, 11:40 AM   Addendum  I personally evaluated patient on 11/21/2013. I agree with the above findings. Plan formulated with Algis Downs PA. We ambulated him down the hallway, did well, satting 93% on room air ambulatory. Patient has shown significant  improvement, we'll plan to discharge him today on oral antimicrobial therapy.

## 2013-11-21 NOTE — Progress Notes (Signed)
UR completed. Verneta Hamidi RN CCM Case Mgmt phone [phone] 

## 2013-11-25 LAB — CULTURE, BLOOD (ROUTINE X 2): Culture: NO GROWTH

## 2014-01-23 ENCOUNTER — Emergency Department (HOSPITAL_COMMUNITY): Payer: Non-veteran care

## 2014-01-23 ENCOUNTER — Observation Stay (HOSPITAL_COMMUNITY): Payer: Non-veteran care

## 2014-01-23 ENCOUNTER — Inpatient Hospital Stay (HOSPITAL_COMMUNITY)
Admission: EM | Admit: 2014-01-23 | Discharge: 2014-01-27 | DRG: 093 | Disposition: A | Discharge status: Home Health | Payer: Non-veteran care | Attending: Internal Medicine | Admitting: Internal Medicine

## 2014-01-23 DIAGNOSIS — F1120 Opioid dependence, uncomplicated: Secondary | ICD-10-CM

## 2014-01-23 DIAGNOSIS — Z7982 Long term (current) use of aspirin: Secondary | ICD-10-CM

## 2014-01-23 DIAGNOSIS — F319 Bipolar disorder, unspecified: Secondary | ICD-10-CM

## 2014-01-23 DIAGNOSIS — K219 Gastro-esophageal reflux disease without esophagitis: Secondary | ICD-10-CM | POA: Diagnosis present

## 2014-01-23 DIAGNOSIS — F32A Depression, unspecified: Secondary | ICD-10-CM

## 2014-01-23 DIAGNOSIS — F329 Major depressive disorder, single episode, unspecified: Secondary | ICD-10-CM | POA: Diagnosis present

## 2014-01-23 DIAGNOSIS — G929 Unspecified toxic encephalopathy: Principal | ICD-10-CM | POA: Diagnosis present

## 2014-01-23 DIAGNOSIS — G92 Toxic encephalopathy: Principal | ICD-10-CM | POA: Diagnosis present

## 2014-01-23 DIAGNOSIS — F411 Generalized anxiety disorder: Secondary | ICD-10-CM | POA: Diagnosis present

## 2014-01-23 DIAGNOSIS — G894 Chronic pain syndrome: Secondary | ICD-10-CM

## 2014-01-23 DIAGNOSIS — Z8673 Personal history of transient ischemic attack (TIA), and cerebral infarction without residual deficits: Secondary | ICD-10-CM | POA: Diagnosis not present

## 2014-01-23 DIAGNOSIS — H109 Unspecified conjunctivitis: Secondary | ICD-10-CM | POA: Diagnosis present

## 2014-01-23 DIAGNOSIS — J189 Pneumonia, unspecified organism: Secondary | ICD-10-CM

## 2014-01-23 DIAGNOSIS — T50995A Adverse effect of other drugs, medicaments and biological substances, initial encounter: Secondary | ICD-10-CM | POA: Diagnosis present

## 2014-01-23 DIAGNOSIS — R4182 Altered mental status, unspecified: Secondary | ICD-10-CM | POA: Diagnosis present

## 2014-01-23 DIAGNOSIS — Z79899 Other long term (current) drug therapy: Secondary | ICD-10-CM

## 2014-01-23 DIAGNOSIS — Z87891 Personal history of nicotine dependence: Secondary | ICD-10-CM

## 2014-01-23 DIAGNOSIS — A419 Sepsis, unspecified organism: Secondary | ICD-10-CM

## 2014-01-23 DIAGNOSIS — E872 Acidosis, unspecified: Secondary | ICD-10-CM

## 2014-01-23 DIAGNOSIS — D72829 Elevated white blood cell count, unspecified: Secondary | ICD-10-CM | POA: Diagnosis present

## 2014-01-23 DIAGNOSIS — G9340 Encephalopathy, unspecified: Secondary | ICD-10-CM

## 2014-01-23 DIAGNOSIS — R6510 Systemic inflammatory response syndrome (SIRS) of non-infectious origin without acute organ dysfunction: Secondary | ICD-10-CM

## 2014-01-23 DIAGNOSIS — F1920 Other psychoactive substance dependence, uncomplicated: Secondary | ICD-10-CM

## 2014-01-23 HISTORY — DX: Other chronic pain: G89.29

## 2014-01-23 HISTORY — DX: Pneumonia, unspecified organism: J18.9

## 2014-01-23 HISTORY — DX: Anxiety disorder, unspecified: F41.9

## 2014-01-23 HISTORY — DX: Low back pain, unspecified: M54.50

## 2014-01-23 HISTORY — DX: Low back pain: M54.5

## 2014-01-23 LAB — CBC WITH DIFFERENTIAL/PLATELET
BASOS PCT: 0 % (ref 0–1)
Basophils Absolute: 0.1 10*3/uL (ref 0.0–0.1)
Eosinophils Absolute: 0.3 10*3/uL (ref 0.0–0.7)
Eosinophils Relative: 2 % (ref 0–5)
HCT: 39.5 % (ref 39.0–52.0)
Hemoglobin: 13.3 g/dL (ref 13.0–17.0)
LYMPHS ABS: 2 10*3/uL (ref 0.7–4.0)
Lymphocytes Relative: 16 % (ref 12–46)
MCH: 31.4 pg (ref 26.0–34.0)
MCHC: 33.7 g/dL (ref 30.0–36.0)
MCV: 93.4 fL (ref 78.0–100.0)
MONOS PCT: 8 % (ref 3–12)
Monocytes Absolute: 1.1 10*3/uL — ABNORMAL HIGH (ref 0.1–1.0)
NEUTROS ABS: 9.6 10*3/uL — AB (ref 1.7–7.7)
Neutrophils Relative %: 74 % (ref 43–77)
PLATELETS: 192 10*3/uL (ref 150–400)
RBC: 4.23 MIL/uL (ref 4.22–5.81)
RDW: 13.6 % (ref 11.5–15.5)
WBC: 13 10*3/uL — ABNORMAL HIGH (ref 4.0–10.5)

## 2014-01-23 LAB — URINALYSIS, ROUTINE W REFLEX MICROSCOPIC
BILIRUBIN URINE: NEGATIVE
Glucose, UA: NEGATIVE mg/dL
HGB URINE DIPSTICK: NEGATIVE
KETONES UR: NEGATIVE mg/dL
Nitrite: NEGATIVE
PH: 6.5 (ref 5.0–8.0)
PROTEIN: NEGATIVE mg/dL
Specific Gravity, Urine: 1.024 (ref 1.005–1.030)
Urobilinogen, UA: 0.2 mg/dL (ref 0.0–1.0)

## 2014-01-23 LAB — URINE MICROSCOPIC-ADD ON

## 2014-01-23 LAB — PRO B NATRIURETIC PEPTIDE: Pro B Natriuretic peptide (BNP): 63.5 pg/mL (ref 0–125)

## 2014-01-23 LAB — COMPREHENSIVE METABOLIC PANEL
ALK PHOS: 56 U/L (ref 39–117)
ALT: 10 U/L (ref 0–53)
AST: 13 U/L (ref 0–37)
Albumin: 3.5 g/dL (ref 3.5–5.2)
BILIRUBIN TOTAL: 0.4 mg/dL (ref 0.3–1.2)
BUN: 10 mg/dL (ref 6–23)
CHLORIDE: 99 meq/L (ref 96–112)
CO2: 26 mEq/L (ref 19–32)
Calcium: 9.2 mg/dL (ref 8.4–10.5)
Creatinine, Ser: 0.97 mg/dL (ref 0.50–1.35)
GFR calc Af Amer: >90 mL/min (ref >90)
GFR calc non Af Amer: 86 mL/min — ABNORMAL LOW (ref >90)
Glucose, Bld: 132 mg/dL — ABNORMAL HIGH (ref 70–99)
POTASSIUM: 4.1 meq/L (ref 3.7–5.3)
SODIUM: 137 meq/L (ref 137–147)
Total Protein: 7.4 g/dL (ref 6.0–8.3)

## 2014-01-23 LAB — RAPID URINE DRUG SCREEN, HOSP PERFORMED
Amphetamines: POSITIVE — AB
BARBITURATES: POSITIVE — AB
Benzodiazepines: POSITIVE — AB
COCAINE: NOT DETECTED
Opiates: POSITIVE — AB
Tetrahydrocannabinol: NOT DETECTED

## 2014-01-23 LAB — ETHANOL

## 2014-01-23 LAB — TROPONIN I: Troponin I: <0.3 ng/mL (ref <0.30)

## 2014-01-23 LAB — CG4 I-STAT (LACTIC ACID): LACTIC ACID, VENOUS: 0.84 mmol/L (ref 0.5–2.2)

## 2014-01-23 MED ORDER — ENOXAPARIN SODIUM 40 MG/0.4ML ~~LOC~~ SOLN
40.0000 mg | SUBCUTANEOUS | Status: DC
  Administered 2014-01-23 – 2014-01-26 (×4): 40 mg via SUBCUTANEOUS
  Filled 2014-01-23 (×5): qty 0.4

## 2014-01-23 MED ORDER — CLONAZEPAM 0.5 MG PO TABS
0.5000 mg | ORAL_TABLET | Freq: Every day | ORAL | Status: DC
  Administered 2014-01-23 – 2014-01-24 (×2): 0.5 mg via ORAL
  Filled 2014-01-23 (×2): qty 1

## 2014-01-23 MED ORDER — FAMOTIDINE 20 MG PO TABS
20.0000 mg | ORAL_TABLET | Freq: Every day | ORAL | Status: DC
  Administered 2014-01-24 – 2014-01-27 (×4): 20 mg via ORAL
  Filled 2014-01-23 (×4): qty 1

## 2014-01-23 MED ORDER — HYDROCODONE-ACETAMINOPHEN 10-325 MG PO TABS
1.0000 | ORAL_TABLET | Freq: Four times a day (QID) | ORAL | Status: DC | PRN
  Administered 2014-01-23: 1 via ORAL
  Filled 2014-01-23: qty 1

## 2014-01-23 MED ORDER — ASPIRIN EC 81 MG PO TBEC
81.0000 mg | DELAYED_RELEASE_TABLET | Freq: Every day | ORAL | Status: DC
  Administered 2014-01-23 – 2014-01-27 (×5): 81 mg via ORAL
  Filled 2014-01-23 (×5): qty 1

## 2014-01-23 MED ORDER — HYDROCODONE BITARTRATE ER 40 MG PO CP12: 40.0000 mg | ORAL_CAPSULE | Freq: Two times a day (BID) | ORAL | Status: DC

## 2014-01-23 MED ORDER — TRAZODONE HCL 50 MG PO TABS
50.0000 mg | ORAL_TABLET | Freq: Every evening | ORAL | Status: DC | PRN
  Administered 2014-01-23 – 2014-01-27 (×3): 50 mg via ORAL
  Filled 2014-01-23 (×3): qty 1

## 2014-01-23 MED ORDER — BUPROPION HCL ER (SR) 150 MG PO TB12
150.0000 mg | ORAL_TABLET | Freq: Two times a day (BID) | ORAL | Status: DC
  Administered 2014-01-23 – 2014-01-24 (×2): 150 mg via ORAL
  Filled 2014-01-23 (×3): qty 1

## 2014-01-23 MED ORDER — VANCOMYCIN HCL IN DEXTROSE 1-5 GM/200ML-% IV SOLN
1000.0000 mg | INTRAVENOUS | Status: AC
  Administered 2014-01-23 (×2): 1000 mg via INTRAVENOUS
  Filled 2014-01-23 (×2): qty 200

## 2014-01-23 MED ORDER — ONDANSETRON HCL 4 MG PO TABS
4.0000 mg | ORAL_TABLET | Freq: Four times a day (QID) | ORAL | Status: DC | PRN
  Administered 2014-01-26: 4 mg via ORAL
  Filled 2014-01-23: qty 1

## 2014-01-23 MED ORDER — VANCOMYCIN HCL IN DEXTROSE 1-5 GM/200ML-% IV SOLN
1000.0000 mg | Freq: Two times a day (BID) | INTRAVENOUS | Status: DC
  Administered 2014-01-24 – 2014-01-26 (×5): 1000 mg via INTRAVENOUS
  Filled 2014-01-23 (×6): qty 200

## 2014-01-23 MED ORDER — SODIUM CHLORIDE 0.9 % IJ SOLN
3.0000 mL | Freq: Two times a day (BID) | INTRAMUSCULAR | Status: DC
  Administered 2014-01-23: 3 mL via INTRAVENOUS

## 2014-01-23 MED ORDER — VANCOMYCIN HCL IN DEXTROSE 1-5 GM/200ML-% IV SOLN: 1000.0000 mg | Freq: Once | INTRAVENOUS | Status: DC

## 2014-01-23 MED ORDER — PIPERACILLIN-TAZOBACTAM 3.375 G IVPB
3.3750 g | Freq: Three times a day (TID) | INTRAVENOUS | Status: DC
  Administered 2014-01-23 – 2014-01-26 (×8): 3.375 g via INTRAVENOUS
  Filled 2014-01-23 (×10): qty 50

## 2014-01-23 MED ORDER — PNEUMOCOCCAL VAC POLYVALENT 25 MCG/0.5ML IJ INJ
0.5000 mL | INJECTION | INTRAMUSCULAR | Status: AC
  Administered 2014-01-24: 0.5 mL via INTRAMUSCULAR
  Filled 2014-01-23: qty 0.5

## 2014-01-23 MED ORDER — SODIUM CHLORIDE 0.9 % IV SOLN
1000.0000 mL | Freq: Once | INTRAVENOUS | Status: AC
  Administered 2014-01-23: 1000 mL via INTRAVENOUS

## 2014-01-23 MED ORDER — SODIUM CHLORIDE 0.9 % IV SOLN
INTRAVENOUS | Status: AC
  Administered 2014-01-23: 17:00:00 via INTRAVENOUS

## 2014-01-23 MED ORDER — PRIMIDONE 50 MG PO TABS
100.0000 mg | ORAL_TABLET | Freq: Every day | ORAL | Status: DC
  Administered 2014-01-23 – 2014-01-26 (×4): 100 mg via ORAL
  Filled 2014-01-23 (×5): qty 2

## 2014-01-23 MED ORDER — SODIUM CHLORIDE 0.9 % IV SOLN
1000.0000 mL | INTRAVENOUS | Status: DC
  Administered 2014-01-23: 1000 mL via INTRAVENOUS

## 2014-01-23 MED ORDER — LITHIUM CARBONATE ER 450 MG PO TBCR
900.0000 mg | EXTENDED_RELEASE_TABLET | Freq: Every day | ORAL | Status: DC
  Administered 2014-01-23 – 2014-01-26 (×4): 900 mg via ORAL
  Filled 2014-01-23 (×5): qty 2

## 2014-01-23 MED ORDER — SODIUM CHLORIDE 0.9 % IV SOLN
INTRAVENOUS | Status: DC
  Administered 2014-01-23 – 2014-01-24 (×2): via INTRAVENOUS

## 2014-01-23 MED ORDER — ACETAMINOPHEN 650 MG RE SUPP
650.0000 mg | Freq: Once | RECTAL | Status: AC
  Administered 2014-01-23: 650 mg via RECTAL
  Filled 2014-01-23: qty 1

## 2014-01-23 MED ORDER — ONDANSETRON HCL 4 MG/2ML IJ SOLN
4.0000 mg | Freq: Four times a day (QID) | INTRAMUSCULAR | Status: DC | PRN
  Filled 2014-01-23: qty 2

## 2014-01-23 MED ORDER — PIPERACILLIN-TAZOBACTAM 3.375 G IVPB 30 MIN
3.3750 g | Freq: Once | INTRAVENOUS | Status: AC
  Administered 2014-01-23: 3.375 g via INTRAVENOUS
  Filled 2014-01-23: qty 50

## 2014-01-23 MED ORDER — RISPERIDONE 2 MG PO TABS
4.0000 mg | ORAL_TABLET | Freq: Every day | ORAL | Status: DC
  Administered 2014-01-23 – 2014-01-24 (×2): 4 mg via ORAL
  Filled 2014-01-23 (×2): qty 2

## 2014-01-23 NOTE — ED Notes (Signed)
Pt returned from CT °

## 2014-01-23 NOTE — H&P (Addendum)
[facility] History and Physical  Alan Brown WUJ:[id number] DOB: 20-Jan-1951 DOA: 01/23/2014  Referring physician: EDP PCP: Lupe Carney, MD   Chief Complaint: brought in by wife for ams since yesterday.  HPI: Alan Brown is a 63 y.o. male  With mulitple medical problems, including tia, depression, bi polar, brought in for AMS, and slurred speech. On talking to the patient pt speech is normal and is oriented to place, person and time. He reports fever and myalgias. On arrival to ED, pt underwent cxr which was negative for pneumonia. UA was negative for infection. UDS ordered and results pending. He is referred to medical service for admission for ams evaluation.    Review of Systems:  See hpi otherwise ngative.   Past Medical History  Diagnosis Date  . Arthritis   . Depression   . GERD (gastroesophageal reflux disease)   . Bipolar 1 disorder   . Prolapse, disk     lower back  . Depression    Past Surgical History  Procedure Laterality Date  . Appendectomy  1961  . Tonsillectomy  1976  . Inguinal hernia repair  1998    left   Social History:  reports that he has quit smoking. He has never used smokeless tobacco. He reports that he does not drink alcohol or use illicit drugs.  Allergies  Allergen Reactions  . Naproxen Other (See Comments)    Increases lithium toxicity.     No family history on file.   Prior to Admission medications   Medication Sig Start Date End Date Taking? Authorizing Provider  aspirin EC 81 MG tablet Take 81 mg by mouth daily.   Yes Historical Provider, MD  buPROPion (WELLBUTRIN SR) 150 MG 12 hr tablet Take 150 mg by mouth 2 (two) times daily.    Yes Historical Provider, MD  Calcium Carb-Cholecalciferol (CALCIUM 600 + D PO) Take 1 tablet by mouth 2 (two) times daily.   Yes Historical Provider, MD  clonazePAM (KLONOPIN) 1 MG tablet Take 0.5 mg by mouth daily.   Yes Historical Provider, MD  fish oil-omega-3 fatty acids  1000 MG capsule Take 3 g by mouth 2 (two) times daily.    Yes Historical Provider, MD  gabapentin (NEURONTIN) 300 MG capsule Take 300 mg by mouth 4 (four) times daily.   Yes Historical Provider, MD  HYDROcodone Bitartrate ER 40 MG CP12 Take 40 mg by mouth 2 (two) times daily.   Yes Historical Provider, MD  ketoconazole (NIZORAL) 2 % shampoo Apply 1 application topically 3 (three) times a week.   Yes Historical Provider, MD  lithium carbonate (ESKALITH) 450 MG CR tablet Take 900 mg by mouth at bedtime.   Yes Historical Provider, MD  naproxen (NAPROSYN) 375 MG tablet Take 375 mg by mouth 2 (two) times daily with a meal.   Yes Historical Provider, MD  primidone (MYSOLINE) 50 MG tablet Take 100 mg by mouth daily.    Yes Historical Provider, MD  ranitidine (ZANTAC) 150 MG tablet Take 150 mg by mouth 2 (two) times daily.    Yes Historical Provider, MD  risperidone (RISPERDAL) 4 MG tablet Take 4 mg by mouth daily.   Yes Historical Provider, MD  traZODone (DESYREL) 50 MG tablet Take 50 mg by mouth at bedtime as needed for sleep.   Yes Historical Provider, MD   Physical Exam: Filed Vitals:   01/23/14 1700  BP: 107/63  Pulse: 73  Temp:   Resp: 25    BP 107/63  Pulse  73  Temp(Src) 99.1 F (37.3 C) (Rectal)  Resp 25  SpO2 96%  General:  Appears calm and comfortable Eyes: PERRL, normal lids, irises & conjunctiva ENT: grossly normal hearing, lips & tongue Neck: no LAD, masses or thyromegaly Cardiovascular: RRR, no m/r/g. No LE edema. Telemetry: SR, no arrhythmias  Respiratory: CTA bilaterally, no w/r/r. Normal respiratory effort. Abdomen: soft, ntnd Skin: no rash or induration seen on limited exam Musculoskeletal: grossly normal tone BUE/BLE Psychiatric: grossly normal mood and affect, speech fluent and appropriate Neurologic: grossly non-focal.          Labs on Admission:  Basic Metabolic Panel:  Recent Labs Lab 01/23/14 1427  NA 137  K 4.1  CL 99  CO2 26  GLUCOSE 132*  BUN 10   CREATININE 0.97  CALCIUM 9.2   Liver Function Tests:  Recent Labs Lab 01/23/14 1427  AST 13  ALT 10  ALKPHOS 56  BILITOT 0.4  PROT 7.4  ALBUMIN 3.5   No results found for this basename: LIPASE, AMYLASE,  in the last 168 hours No results found for this basename: AMMONIA,  in the last 168 hours CBC:  Recent Labs Lab 01/23/14 1427  WBC 13.0*  NEUTROABS 9.6*  HGB 13.3  HCT 39.5  MCV 93.4  PLT 192   Cardiac Enzymes:  Recent Labs Lab 01/23/14 1428  TROPONINI <0.30    BNP (last 3 results)  Recent Labs  01/23/14 1428  PROBNP 63.5   CBG: No results found for this basename: GLUCAP,  in the last 168 hours  Radiological Exams on Admission: Ct Head Wo Contrast  01/23/2014   CLINICAL DATA:  Altered mental status  EXAM: CT HEAD WITHOUT CONTRAST  TECHNIQUE: Contiguous axial images were obtained from the base of the skull through the vertex without intravenous contrast. Study was obtained within 24 hr patient's arrival the emergency department.  COMPARISON:  Brain CT January 15, 2011 and brain MRI and Apr 17, 2011  FINDINGS: The ventricles are normal in size and configuration. Left lateral ventricle is slightly larger than the right lateral ventricle, an anatomic variant. There is no appreciable mass, hemorrhage, extra-axial fluid collection, or midline shift. Minimal periventricular small vessel disease is stable. There is no new gray-white compartment lesion. No demonstrable acute infarct.  Bony calvarium appears intact. The mastoid air cells are clear. There is bilateral ethmoid sinus disease.  IMPRESSION: Bilateral ethmoid sinus disease. Minimal periventricular small vessel disease. No intracranial mass, hemorrhage, or acute appearing infarct.   Electronically Signed   By: Bretta BangWilliam  Woodruff M.D.   On: 01/23/2014 15:48   Dg Chest Port 1 View  01/23/2014   CLINICAL DATA:  Altered mental status  EXAM: PORTABLE CHEST - 1 VIEW  COMPARISON:  11/20/2013  FINDINGS: Cardiomediastinal  silhouette is is stable. No acute infiltrate or pleural effusion. No pulmonary edema. Mild basilar atelectasis.  IMPRESSION: No acute infiltrate or pulmonary edema.  Mild basilar atelectasis.   Electronically Signed   By: Natasha MeadLiviu  Pop M.D.   On: 01/23/2014 14:51    EKG: sinus rhythm.  Assessment/Plan Active Problems:   Sepsis   Altered mental status   1. Fever , leukocytosis; ? SIRS : no source of infection so far blood cultures ordered  And pending. UDS positive for smphetamines, barbiturates, opiates and benzos. He is currently alert and oriented. Neuro exam normal. Speech normal no facial drooop. MRI brain negative for stroke, would consider carotid dopplers and mra head neck to complete the stroke work up if needed.  Strongly suspect AMS is secondary to multi drug use. He denied suicidal ideation. Monitor for withdrawal effects.  Gentle hydration and repeat labs in am.  Psychiatry consult needed in am. i put hm on suicidal precautions.   DVTprophylaxis.   Code Status: full code Family Communication: none at bedside Disposition Plan: admitted  Time spent: 75 min  [facility] Pager [phone]

## 2014-01-23 NOTE — ED Provider Notes (Signed)
CSN: [id number]     Arrival date & time 01/23/14  1411 History   First MD Initiated Contact with Patient 01/23/14 1411     Chief Complaint  Patient presents with  . Altered Mental Status     (Consider location/radiation/quality/duration/timing/severity/associated sxs/prior Treatment) HPI Comments: Patient is a 63 year old male past medical history significant for TIA, arthritis, depression, GERD, bipolar 1 disorder, depression presented to the emergency department for altered mental status that began around 8 AM yesterday morning when the patient awoke according to the wife. EMS states that the wife said he had some slurred speech which has resolved but patient continues to be lethargic today. Patient endorses he feels myalgias and generalized weakness but denies any specific pain. He states he has sick contacts at home with influenza-like symptoms. He was hospitalized at the beginning of December 2014 for CAP. Patient is a level V Caveat d/t mental status change.    Past Medical History  Diagnosis Date  . Arthritis   . Depression   . GERD (gastroesophageal reflux disease)   . Bipolar 1 disorder   . Prolapse, disk     lower back  . Depression    Past Surgical History  Procedure Laterality Date  . Appendectomy  1961  . Tonsillectomy  1976  . Inguinal hernia repair  1998    left   No family history on file. History  Substance Use Topics  . Smoking status: Former Games developermoker  . Smokeless tobacco: Never Used  . Alcohol Use: No    Review of Systems  Unable to perform ROS: Mental status change  Constitutional: Positive for fever.      Allergies  Naproxen  Home Medications   Current Outpatient Rx  Name  Route  Sig  Dispense  Refill  . aspirin EC 81 MG tablet   Oral   Take 81 mg by mouth daily.         Marland Kitchen. buPROPion (WELLBUTRIN SR) 150 MG 12 hr tablet   Oral   Take 150 mg by mouth 2 (two) times daily.          . Calcium Carb-Cholecalciferol (CALCIUM 600 + D PO)    Oral   Take 1 tablet by mouth 2 (two) times daily.         . clonazePAM (KLONOPIN) 1 MG tablet   Oral   Take 0.5 mg by mouth daily.         . fish oil-omega-3 fatty acids 1000 MG capsule   Oral   Take 3 g by mouth 2 (two) times daily.          Marland Kitchen. gabapentin (NEURONTIN) 300 MG capsule   Oral   Take 300 mg by mouth 4 (four) times daily.         Marland Kitchen. HYDROcodone Bitartrate ER 40 MG CP12   Oral   Take 40 mg by mouth 2 (two) times daily.         Marland Kitchen. ketoconazole (NIZORAL) 2 % shampoo   Topical   Apply 1 application topically 3 (three) times a week.         . lithium carbonate (ESKALITH) 450 MG CR tablet   Oral   Take 900 mg by mouth at bedtime.         . naproxen (NAPROSYN) 375 MG tablet   Oral   Take 375 mg by mouth 2 (two) times daily with a meal.         . primidone (MYSOLINE) 50 MG tablet  Oral   Take 100 mg by mouth daily.          . ranitidine (ZANTAC) 150 MG tablet   Oral   Take 150 mg by mouth 2 (two) times daily.          . risperidone (RISPERDAL) 4 MG tablet   Oral   Take 4 mg by mouth daily.         . traZODone (DESYREL) 50 MG tablet   Oral   Take 50 mg by mouth at bedtime as needed for sleep.          BP 107/63  Pulse 73  Temp(Src) 99.1 F (37.3 C) (Rectal)  Resp 25  SpO2 96% Physical Exam  Vitals reviewed. Constitutional: He is oriented to person, place, and time. He appears well-developed and well-nourished. He appears lethargic. He is sleeping. He appears ill. No distress. Nasal cannula in place.  HENT:  Head: Normocephalic and atraumatic.  Right Ear: External ear normal.  Left Ear: External ear normal.  Nose: Nose normal.  Eyes: Conjunctivae are normal.  Neck: Neck supple.  Cardiovascular: Normal rate, regular rhythm and normal heart sounds.   Pulmonary/Chest: Effort normal. No respiratory distress.  Abdominal: Soft. There is no tenderness.  Musculoskeletal: Normal range of motion. He exhibits no edema.  Neurological:  He is oriented to person, place, and time. He appears lethargic. GCS eye subscore is 3. GCS verbal subscore is 4. GCS motor subscore is 6.  Patient intermittently able to follow commands unable to complete full neuro examination d/t acuity of condition.   Skin: He is not diaphoretic.    ED Course  Procedures (including critical care time) Medications  0.9 %  sodium chloride infusion (0 mLs Intravenous Stopped 01/23/14 1520)    Followed by  0.9 %  sodium chloride infusion (1,000 mLs Intravenous New Bag/Given 01/23/14 1435)  piperacillin-tazobactam (ZOSYN) IVPB 3.375 g (not administered)  vancomycin (VANCOCIN) IVPB 1000 mg/200 mL premix (not administered)  0.9 %  sodium chloride infusion ( Intravenous New Bag/Given 01/23/14 1713)  acetaminophen (TYLENOL) suppository 650 mg (650 mg Rectal Given 01/23/14 1505)  piperacillin-tazobactam (ZOSYN) IVPB 3.375 g (0 g Intravenous Stopped 01/23/14 1522)  vancomycin (VANCOCIN) IVPB 1000 mg/200 mL premix (0 mg Intravenous Stopped 01/23/14 1727)    Labs Review Labs Reviewed  CBC WITH DIFFERENTIAL - Abnormal; Notable for the following:    WBC 13.0 (*)    Neutro Abs 9.6 (*)    Monocytes Absolute 1.1 (*)    All other components within normal limits  COMPREHENSIVE METABOLIC PANEL - Abnormal; Notable for the following:    Glucose, Bld 132 (*)    GFR calc non Af Amer 86 (*)    All other components within normal limits  URINALYSIS, ROUTINE W REFLEX MICROSCOPIC - Abnormal; Notable for the following:    Leukocytes, UA TRACE (*)    All other components within normal limits  URINE MICROSCOPIC-ADD ON - Abnormal; Notable for the following:    Squamous Epithelial / LPF FEW (*)    All other components within normal limits  CULTURE, BLOOD (ROUTINE X 2)  CULTURE, BLOOD (ROUTINE X 2)  URINE CULTURE  TROPONIN I  PRO B NATRIURETIC PEPTIDE  INFLUENZA PANEL BY PCR (TYPE A & B, H1N1)  CG4 I-STAT (LACTIC ACID)   Imaging Review Ct Head Wo Contrast  01/23/2014   CLINICAL  DATA:  Altered mental status  EXAM: CT HEAD WITHOUT CONTRAST  TECHNIQUE: Contiguous axial images were obtained from the base of  the skull through the vertex without intravenous contrast. Study was obtained within 24 hr patient's arrival the emergency department.  COMPARISON:  Brain CT January 15, 2011 and brain MRI and Apr 17, 2011  FINDINGS: The ventricles are normal in size and configuration. Left lateral ventricle is slightly larger than the right lateral ventricle, an anatomic variant. There is no appreciable mass, hemorrhage, extra-axial fluid collection, or midline shift. Minimal periventricular small vessel disease is stable. There is no new gray-white compartment lesion. No demonstrable acute infarct.  Bony calvarium appears intact. The mastoid air cells are clear. There is bilateral ethmoid sinus disease.  IMPRESSION: Bilateral ethmoid sinus disease. Minimal periventricular small vessel disease. No intracranial mass, hemorrhage, or acute appearing infarct.   Electronically Signed   By: Bretta Bang M.D.   On: 01/23/2014 15:48   Dg Chest Port 1 View  01/23/2014   CLINICAL DATA:  Altered mental status  EXAM: PORTABLE CHEST - 1 VIEW  COMPARISON:  11/20/2013  FINDINGS: Cardiomediastinal silhouette is is stable. No acute infiltrate or pleural effusion. No pulmonary edema. Mild basilar atelectasis.  IMPRESSION: No acute infiltrate or pulmonary edema.  Mild basilar atelectasis.   Electronically Signed   By: Natasha Mead M.D.   On: 01/23/2014 14:51    EKG Interpretation    Date/Time:  Monday January 23 2014 15:15:55 EST Ventricular Rate:  83 PR Interval:  174 QRS Duration: 97 QT Interval:  391 QTC Calculation: 459 R Axis:   68 Text Interpretation:  Sinus rhythm Atrial premature complex Borderline T wave abnormalities Confirmed by WARD  DO, KRISTEN ([id number]) on 01/23/2014 4:25:04 PM           CRITICAL CARE Performed by: Francee Piccolo L   Total critical care time: 60  minutes  Critical care time was exclusive of separately billable procedures and treating other patients.  Critical care was necessary to treat or prevent imminent or life-threatening deterioration.  Critical care was time spent personally by me on the following activities: development of treatment plan with patient and/or surrogate as well as nursing, discussions with consultants, evaluation of patient's response to treatment, examination of patient, obtaining history from patient or surrogate, ordering and performing treatments and interventions, ordering and review of laboratory studies, ordering and review of radiographic studies, pulse oximetry and re-evaluation of patient's condition.  MDM   Final diagnoses:  Sepsis    Filed Vitals:   01/23/14 1700  BP: 107/63  Pulse: 73  Temp:   Resp: 25    Patient tachycardiac and hypoxic, with AMS with EMS upon arrival. Febrile in ED. Hypoxia improved with nasal cannula. Patient is lethargic, but able to answer some questions during examination. Code Sepsis called. Broad spectrum antibiotics, Vanc and Zoysn will be administered. Influenza panel will be obtained. Chest x-ray without acute infiltrate or edema, mild basilar atelectasis appreciated. CT scan unremarkable for any acute abnormalities. Mild leukocytosis. Lactate normal. Troponin negative. Fever has responded to suppository Tylenol given. I have reviewed nursing notes, vital signs, and all appropriate lab and imaging results for this patient. Patient will be admitted to step down unit for further evaluation of sepsis workup with altered mental status.Patient d/w with Dr. Elesa Massed, agrees with plan.      Jeannetta Ellis, PA-C 01/23/14 1827

## 2014-01-23 NOTE — ED Notes (Signed)
Admitting MD at bedside.

## 2014-01-23 NOTE — ED Provider Notes (Addendum)
Medical screening examination/treatment/procedure(s) were conducted as a shared visit with non-physician practitioner(s) and myself.  I personally evaluated the patient during the encounter.   Date: 01/23/2014 15:15  Rate: 83  Rhythm: normal sinus rhythm  QRS Axis: normal  Intervals: normal  ST/T Wave abnormalities: normal  Conduction Disutrbances: none  Narrative Interpretation: nonspecific ST flattening     Pt is a 63yo M with h/o recent admission for acute acquired pneumonia presents emergency department with fever, confusion and slurred speech etc. yesterday morning, hypoxia. Concern for possible healthcare associated pneumonia. Will give broad-spectrum antibiotics, IV fluids. He is normotensive. Labs, cultures, x-ray, head CT pending. Patient will need admission.    Layla MawKristen N Devonia Farro, DO 01/23/14 1500  Aeva Posey N Evonda Enge, DO 01/23/14 1550

## 2014-01-23 NOTE — ED Notes (Signed)
DELIVERED PATIENTS LAB RESULTS TO PATIENTS NURES OF CG4+ LACTIC ACID @15 :55 PM ,01/23/2014.

## 2014-01-23 NOTE — ED Notes (Signed)
Per EMS: PT from home, wife reports altered mental status (confusion) and slurred speech since yesterday at 0800 AM. Pt now AO x4; lethargic. 136/84. ST 120. CBG 132. Hx: TIA

## 2014-01-23 NOTE — Progress Notes (Signed)
ANTIBIOTIC CONSULT NOTE - INITIAL  Pharmacy Consult for vancomycin and zosyn Indication: rule out sepsis  Allergies  Allergen Reactions  . Naproxen Other (See Comments)    Increases lithium toxicity.     Patient Measurements:   Weight: 103.4 kg 11/19/13  Vital Signs: Temp: 100.9 F (38.3 C) (02/09 1420) Temp src: Rectal (02/09 1420) BP: 124/78 mmHg (02/09 1416) Intake/Output from previous day:   Intake/Output from this shift:    Labs: No results found for this basename: WBC, HGB, PLT, LABCREA, CREATININE,  in the last 72 hours The CrCl is unknown because both a height and weight (above a minimum accepted value) are required for this calculation. No results found for this basename: VANCOTROUGH, VANCOPEAK, VANCORANDOM, GENTTROUGH, GENTPEAK, GENTRANDOM, TOBRATROUGH, TOBRAPEAK, TOBRARND, AMIKACINPEAK, AMIKACINTROU, AMIKACIN,  in the last 72 hours   Microbiology: No results found for this or any previous visit (from the past 720 hour(s)).  Medical History: Past Medical History  Diagnosis Date  . Arthritis   . Depression   . GERD (gastroesophageal reflux disease)   . Bipolar 1 disorder   . Prolapse, disk     lower back  . Depression     Assessment: 63 yo to start vancomycin and zosyn per pharmacy for r/o sepsis with recent CAP in December.  Wt 103.4 kg on 11/19/13.  Temp 100.9  Pt from home with AMS, confusion and slurred speech since yesterday since 0800am.  No labs drawn yet.  Creat in December was WNL. CT of head ordered. 2/9 BC x 2 2/9 Ucx 2/9 CXR vanc 2/9>> Zosyn 2/9>> Goal of Therapy:  Vancomycin trough level 15-20 mcg/ml  Plan:  1. Zosyn 3.375 gm IV x 1 dose over 30 minutes, give first in the ED, then zosyn 3.375 gm IV q8h, infuse each dose over 4 hours 2. Vancomycin 1000 mg IV over 1 hr followed by vancomycin 1000 mg IV over 1 hr to make a total loading dose of 2000 mg,  give second.  Then vancomycin 1000 mg IV q12h per obesity nomogram. 3. F/u renal fxn,  wbc, temp, culture data, clinical course 4. Check vancomycin steady-state trough as needed Herby AbrahamMichelle T. Erie Radu, Pharm.D. [phone] 01/23/2014 2:49 PM

## 2014-01-24 ENCOUNTER — Encounter (HOSPITAL_COMMUNITY): Payer: Self-pay | Admitting: Neurology

## 2014-01-24 DIAGNOSIS — F32A Depression, unspecified: Secondary | ICD-10-CM | POA: Diagnosis present

## 2014-01-24 DIAGNOSIS — F329 Major depressive disorder, single episode, unspecified: Secondary | ICD-10-CM

## 2014-01-24 DIAGNOSIS — G894 Chronic pain syndrome: Secondary | ICD-10-CM | POA: Diagnosis present

## 2014-01-24 DIAGNOSIS — F319 Bipolar disorder, unspecified: Secondary | ICD-10-CM

## 2014-01-24 DIAGNOSIS — R6510 Systemic inflammatory response syndrome (SIRS) of non-infectious origin without acute organ dysfunction: Secondary | ICD-10-CM

## 2014-01-24 DIAGNOSIS — F1120 Opioid dependence, uncomplicated: Secondary | ICD-10-CM

## 2014-01-24 DIAGNOSIS — F3289 Other specified depressive episodes: Secondary | ICD-10-CM

## 2014-01-24 DIAGNOSIS — F1920 Other psychoactive substance dependence, uncomplicated: Secondary | ICD-10-CM | POA: Diagnosis present

## 2014-01-24 LAB — CBC WITH DIFFERENTIAL/PLATELET
Basophils Absolute: 0 10*3/uL (ref 0.0–0.1)
Basophils Relative: 0 % (ref 0–1)
EOS ABS: 0.3 10*3/uL (ref 0.0–0.7)
EOS PCT: 3 % (ref 0–5)
HCT: 38.7 % — ABNORMAL LOW (ref 39.0–52.0)
HEMOGLOBIN: 12.8 g/dL — AB (ref 13.0–17.0)
LYMPHS ABS: 1.8 10*3/uL (ref 0.7–4.0)
Lymphocytes Relative: 21 % (ref 12–46)
MCH: 31.2 pg (ref 26.0–34.0)
MCHC: 33.1 g/dL (ref 30.0–36.0)
MCV: 94.4 fL (ref 78.0–100.0)
MONOS PCT: 5 % (ref 3–12)
Monocytes Absolute: 0.4 10*3/uL (ref 0.1–1.0)
Neutro Abs: 5.7 10*3/uL (ref 1.7–7.7)
Neutrophils Relative %: 70 % (ref 43–77)
Platelets: 180 10*3/uL (ref 150–400)
RBC: 4.1 MIL/uL — AB (ref 4.22–5.81)
RDW: 13.4 % (ref 11.5–15.5)
WBC: 8.2 10*3/uL (ref 4.0–10.5)

## 2014-01-24 LAB — URINE CULTURE
Colony Count: NO GROWTH
Culture: NO GROWTH

## 2014-01-24 LAB — COMPREHENSIVE METABOLIC PANEL
ALT: 11 U/L (ref 0–53)
AST: 15 U/L (ref 0–37)
Albumin: 3.4 g/dL — ABNORMAL LOW (ref 3.5–5.2)
Alkaline Phosphatase: 51 U/L (ref 39–117)
BUN: 9 mg/dL (ref 6–23)
CALCIUM: 8.7 mg/dL (ref 8.4–10.5)
CO2: 23 mEq/L (ref 19–32)
Chloride: 107 mEq/L (ref 96–112)
Creatinine, Ser: 0.87 mg/dL (ref 0.50–1.35)
GFR, EST NON AFRICAN AMERICAN: 90 mL/min — AB (ref >90)
GLUCOSE: 171 mg/dL — AB (ref 70–99)
Potassium: 4 mEq/L (ref 3.7–5.3)
Sodium: 143 mEq/L (ref 137–147)
TOTAL PROTEIN: 7.4 g/dL (ref 6.0–8.3)
Total Bilirubin: 0.4 mg/dL (ref 0.3–1.2)

## 2014-01-24 LAB — INFLUENZA PANEL BY PCR (TYPE A & B)
H1N1 flu by pcr: NOT DETECTED
INFLAPCR: NEGATIVE
Influenza B By PCR: NEGATIVE

## 2014-01-24 LAB — TSH: TSH: 0.358 u[IU]/mL (ref 0.350–4.500)

## 2014-01-24 LAB — LITHIUM LEVEL: Lithium Lvl: 0.49 mEq/L — ABNORMAL LOW (ref 0.80–1.40)

## 2014-01-24 LAB — LACTIC ACID, PLASMA: Lactic Acid, Venous: 1.2 mmol/L (ref 0.5–2.2)

## 2014-01-24 MED ORDER — RISPERIDONE 2 MG PO TABS: 2.0000 mg | ORAL_TABLET | Freq: Every day | ORAL | Status: DC

## 2014-01-24 MED ORDER — RISPERIDONE 2 MG PO TABS
2.0000 mg | ORAL_TABLET | Freq: Every day | ORAL | Status: DC
  Administered 2014-01-25 – 2014-01-27 (×3): 2 mg via ORAL
  Filled 2014-01-24 (×3): qty 1

## 2014-01-24 MED ORDER — HYDROCODONE-ACETAMINOPHEN 10-325 MG PO TABS
1.0000 | ORAL_TABLET | Freq: Three times a day (TID) | ORAL | Status: DC | PRN
  Administered 2014-01-25 – 2014-01-27 (×5): 1 via ORAL
  Filled 2014-01-24 (×5): qty 1

## 2014-01-24 MED ORDER — BUPROPION HCL ER (SR) 150 MG PO TB12
150.0000 mg | ORAL_TABLET | Freq: Every day | ORAL | Status: DC
  Administered 2014-01-25 – 2014-01-27 (×3): 150 mg via ORAL
  Filled 2014-01-24 (×3): qty 1

## 2014-01-24 MED ORDER — CLONAZEPAM 0.5 MG PO TABS: 0.5000 mg | ORAL_TABLET | Freq: Two times a day (BID) | ORAL | Status: DC | PRN

## 2014-01-24 NOTE — Progress Notes (Signed)
UR completed. Patient changed to inpatient- requiring IV antibiotics and IVF @ 125cc/hr  

## 2014-01-24 NOTE — Progress Notes (Signed)
Blood culture came back in aerobic bottle of gram positive cocci in clusters. Dr. Joseph ArtWoods notified

## 2014-01-24 NOTE — Consult Note (Signed)
Reason for Consult: Medication management of bipolar disorder Referring Physician: Dr. Krystal Eaton Alan Brown is an 63 y.o. male.  HPI: patient is seen and chart reviewed. Case is discussed with his wife and Dr. Sherral Hammers. Patient and his wife stated that he has episodes of confusion, disorientation, slurred speech, increased sleepiness and unstable motor activities, lapses of time, forgetful and incoherent speech. Patient has been diagnosed with bipolar disorder, depression, and received medication from [facility] center. He was a [place] era veteran and he was in [place] and [place] in [phone]. He has no recent psych admission. He was received gabapentin and hydrocodone for pain in the last year. He is 100% disabled veteran. He has limited interaction with twin children (one boy and one girl) who five years old because of increased amount of sleep.   Alan Brown is a 63 y.o. male With mulitple medical problems, including tia, depression, bi polar, brought in for AMS, and slurred speech. On talking to the patient pt speech is normal and is oriented to place, person and time. He reports fever and myalgias. On arrival to ED, pt underwent cxr which was negative for pneumonia. UA was negative for infection. UDS ordered and results pending. He is referred to medical service for admission for ams evaluation.    Mental Status Examination: Patient appeared as per his stated age, casually dressed, and fairly groomed, and maintaining good eye contact. Patient has good mood and his affect was constricted. He has normal rate, rhythm, and volume of speech. His thought process is linear and goal directed. Patient has denied suicidal, homicidal ideations, intentions or plans. Patient has no evidence of auditory or visual hallucinations, delusions, and paranoia. Patient has fair insight judgment and impulse control.  Past Medical History  Diagnosis Date  . Arthritis   . Depression   . GERD  (gastroesophageal reflux disease)   . Bipolar 1 disorder   . Prolapse, disk     lower back  . Depression     Past Surgical History  Procedure Laterality Date  . Appendectomy  1961  . Tonsillectomy  1976  . Inguinal hernia repair  1998    left    Family History  Problem Relation Age of Onset  . Hypertension Mother   . Hypertension Father     Social History:  reports that he has quit smoking. He has never used smokeless tobacco. He reports that he does not drink alcohol or use illicit drugs.  Allergies:  Allergies  Allergen Reactions  . Naproxen Other (See Comments)    Increases lithium toxicity.     Medications: I have reviewed the patient's current medications.  Results for orders placed during the hospital encounter of 01/23/14 (from the past 48 hour(s))  CBC WITH DIFFERENTIAL     Status: Abnormal   Collection Time    01/23/14  2:27 PM      Result Value Range   WBC 13.0 (*) 4.0 - 10.5 K/uL   RBC 4.23  4.22 - 5.81 MIL/uL   Hemoglobin 13.3  13.0 - 17.0 g/dL   HCT 39.5  39.0 - 52.0 %   MCV 93.4  78.0 - 100.0 fL   MCH 31.4  26.0 - 34.0 pg   MCHC 33.7  30.0 - 36.0 g/dL   RDW 13.6  11.5 - 15.5 %   Platelets 192  150 - 400 K/uL   Neutrophils Relative % 74  43 - 77 %   Neutro Abs 9.6 (*) 1.7 -  7.7 K/uL   Lymphocytes Relative 16  12 - 46 %   Lymphs Abs 2.0  0.7 - 4.0 K/uL   Monocytes Relative 8  3 - 12 %   Monocytes Absolute 1.1 (*) 0.1 - 1.0 K/uL   Eosinophils Relative 2  0 - 5 %   Eosinophils Absolute 0.3  0.0 - 0.7 K/uL   Basophils Relative 0  0 - 1 %   Basophils Absolute 0.1  0.0 - 0.1 K/uL  COMPREHENSIVE METABOLIC PANEL     Status: Abnormal   Collection Time    01/23/14  2:27 PM      Result Value Range   Sodium 137  137 - 147 mEq/L   Potassium 4.1  3.7 - 5.3 mEq/L   Chloride 99  96 - 112 mEq/L   CO2 26  19 - 32 mEq/L   Glucose, Bld 132 (*) 70 - 99 mg/dL   BUN 10  6 - 23 mg/dL   Creatinine, Ser 0.97  0.50 - 1.35 mg/dL   Calcium 9.2  8.4 - 10.5 mg/dL    Total Protein 7.4  6.0 - 8.3 g/dL   Albumin 3.5  3.5 - 5.2 g/dL   AST 13  0 - 37 U/L   ALT 10  0 - 53 U/L   Alkaline Phosphatase 56  39 - 117 U/L   Total Bilirubin 0.4  0.3 - 1.2 mg/dL   GFR calc non Af Amer 86 (*) >90 mL/min   GFR calc Af Amer >90  >90 mL/min   Comment: (NOTE)     The eGFR has been calculated using the CKD EPI equation.     This calculation has not been validated in all clinical situations.     eGFR's persistently <90 mL/min signify possible Chronic Kidney     Disease.  TROPONIN I     Status: None   Collection Time    01/23/14  2:28 PM      Result Value Range   Troponin I <0.30  <0.30 ng/mL   Comment:            Due to the release kinetics of cTnI,     a negative result within the first hours     of the onset of symptoms does not rule out     myocardial infarction with certainty.     If myocardial infarction is still suspected,     repeat the test at appropriate intervals.  PRO B NATRIURETIC PEPTIDE     Status: None   Collection Time    01/23/14  2:28 PM      Result Value Range   Pro B Natriuretic peptide (BNP) 63.5  0 - 125 pg/mL  CULTURE, BLOOD (ROUTINE X 2)     Status: None   Collection Time    01/23/14  2:48 PM      Result Value Range   Specimen Description BLOOD ARM RIGHT     Special Requests BOTTLES DRAWN AEROBIC AND ANAEROBIC 5CC     Culture  Setup Time       Value: 01/23/2014 20:49     Performed at [facility]   Culture       Value:        BLOOD CULTURE RECEIVED NO GROWTH TO DATE CULTURE WILL BE HELD FOR 5 DAYS BEFORE ISSUING A FINAL NEGATIVE REPORT     Performed at [facility]   Report Status PENDING    CG4 I-STAT (LACTIC ACID)  Status: None   Collection Time    01/23/14  3:04 PM      Result Value Range   Lactic Acid, Venous 0.84  0.5 - 2.2 mmol/L  URINALYSIS, ROUTINE W REFLEX MICROSCOPIC     Status: Abnormal   Collection Time    01/23/14  3:05 PM      Result Value Range   Color, Urine YELLOW  YELLOW   APPearance  CLEAR  CLEAR   Specific Gravity, Urine 1.024  1.005 - 1.030   pH 6.5  5.0 - 8.0   Glucose, UA NEGATIVE  NEGATIVE mg/dL   Hgb urine dipstick NEGATIVE  NEGATIVE   Bilirubin Urine NEGATIVE  NEGATIVE   Ketones, ur NEGATIVE  NEGATIVE mg/dL   Protein, ur NEGATIVE  NEGATIVE mg/dL   Urobilinogen, UA 0.2  0.0 - 1.0 mg/dL   Nitrite NEGATIVE  NEGATIVE   Leukocytes, UA TRACE (*) NEGATIVE  URINE MICROSCOPIC-ADD ON     Status: Abnormal   Collection Time    01/23/14  3:05 PM      Result Value Range   Squamous Epithelial / LPF FEW (*) RARE   WBC, UA 3-6  <3 WBC/hpf   RBC / HPF 0-2  <3 RBC/hpf   Bacteria, UA RARE  RARE  URINE RAPID DRUG SCREEN (HOSP PERFORMED)     Status: Abnormal   Collection Time    01/23/14  3:05 PM      Result Value Range   Opiates POSITIVE (*) NONE DETECTED   Cocaine NONE DETECTED  NONE DETECTED   Benzodiazepines POSITIVE (*) NONE DETECTED   Amphetamines POSITIVE (*) NONE DETECTED   Tetrahydrocannabinol NONE DETECTED  NONE DETECTED   Barbiturates POSITIVE (*) NONE DETECTED   Comment:            DRUG SCREEN FOR MEDICAL PURPOSES     ONLY.  IF CONFIRMATION IS NEEDED     FOR ANY PURPOSE, NOTIFY LAB     WITHIN 5 DAYS.                LOWEST DETECTABLE LIMITS     FOR URINE DRUG SCREEN     Drug Class       Cutoff (ng/mL)     Amphetamine      1000     Barbiturate      200     Benzodiazepine   396     Tricyclics       886     Opiates          300     Cocaine          300     THC              50  CULTURE, BLOOD (ROUTINE X 2)     Status: None   Collection Time    01/23/14  3:20 PM      Result Value Range   Specimen Description BLOOD HAND RIGHT     Special Requests BOTTLES DRAWN AEROBIC ONLY 10CC     Culture  Setup Time       Value: 01/23/2014 20:50     Performed at [facility]   Culture       Value:        BLOOD CULTURE RECEIVED NO GROWTH TO DATE CULTURE WILL BE HELD FOR 5 DAYS BEFORE ISSUING A FINAL NEGATIVE REPORT     Performed at [facility]    Report Status PENDING  INFLUENZA PANEL BY PCR (TYPE A & B, H1N1)     Status: None   Collection Time    01/23/14  5:21 PM      Result Value Range   Influenza A By PCR NEGATIVE  NEGATIVE   Influenza B By PCR NEGATIVE  NEGATIVE   H1N1 flu by pcr NOT DETECTED  NOT DETECTED   Comment:            The Xpert Flu assay (FDA approved for     nasal aspirates or washes and     nasopharyngeal swab specimens), is     intended as an aid in the diagnosis of     influenza and should not be used as     a sole basis for treatment.  ETHANOL     Status: None   Collection Time    01/23/14 10:28 PM      Result Value Range   Alcohol, Ethyl (B) <11  0 - 11 mg/dL   Comment:            LOWEST DETECTABLE LIMIT FOR     SERUM ALCOHOL IS 11 mg/dL     FOR MEDICAL PURPOSES ONLY  TSH     Status: None   Collection Time    01/24/14 12:00 AM      Result Value Range   TSH 0.358  0.350 - 4.500 uIU/mL   Comment: Performed at [facility]  CBC WITH DIFFERENTIAL     Status: Abnormal   Collection Time    01/24/14  2:04 PM      Result Value Range   WBC 8.2  4.0 - 10.5 K/uL   RBC 4.10 (*) 4.22 - 5.81 MIL/uL   Hemoglobin 12.8 (*) 13.0 - 17.0 g/dL   HCT 38.7 (*) 39.0 - 52.0 %   MCV 94.4  78.0 - 100.0 fL   MCH 31.2  26.0 - 34.0 pg   MCHC 33.1  30.0 - 36.0 g/dL   RDW 13.4  11.5 - 15.5 %   Platelets 180  150 - 400 K/uL   Neutrophils Relative % 70  43 - 77 %   Neutro Abs 5.7  1.7 - 7.7 K/uL   Lymphocytes Relative 21  12 - 46 %   Lymphs Abs 1.8  0.7 - 4.0 K/uL   Monocytes Relative 5  3 - 12 %   Monocytes Absolute 0.4  0.1 - 1.0 K/uL   Eosinophils Relative 3  0 - 5 %   Eosinophils Absolute 0.3  0.0 - 0.7 K/uL   Basophils Relative 0  0 - 1 %   Basophils Absolute 0.0  0.0 - 0.1 K/uL  COMPREHENSIVE METABOLIC PANEL     Status: Abnormal   Collection Time    01/24/14  2:04 PM      Result Value Range   Sodium 143  137 - 147 mEq/L   Potassium 4.0  3.7 - 5.3 mEq/L   Chloride 107  96 - 112 mEq/L   CO2 23  19  - 32 mEq/L   Glucose, Bld 171 (*) 70 - 99 mg/dL   BUN 9  6 - 23 mg/dL   Creatinine, Ser 0.87  0.50 - 1.35 mg/dL   Calcium 8.7  8.4 - 10.5 mg/dL   Total Protein 7.4  6.0 - 8.3 g/dL   Albumin 3.4 (*) 3.5 - 5.2 g/dL   AST 15  0 - 37 U/L   ALT 11  0 - 53 U/L  Alkaline Phosphatase 51  39 - 117 U/L   Total Bilirubin 0.4  0.3 - 1.2 mg/dL   GFR calc non Af Amer 90 (*) >90 mL/min   GFR calc Af Amer >90  >90 mL/min   Comment: (NOTE)     The eGFR has been calculated using the CKD EPI equation.     This calculation has not been validated in all clinical situations.     eGFR's persistently <90 mL/min signify possible Chronic Kidney     Disease.    Dg Chest 2 View  01/23/2014   CLINICAL DATA:  Fever, evaluate for pneumonia.  EXAM: CHEST  2 VIEW  COMPARISON:  January 23, 2014 2:42 p.m.  FINDINGS: The heart size and mediastinal contours are stable. There is no focal infiltrate, pulmonary edema, or pleural effusion. The visualized skeletal structures are stable.  IMPRESSION: No active cardiopulmonary disease.   Electronically Signed   By: Abelardo Diesel M.D.   On: 01/23/2014 19:48   Ct Head Wo Contrast  01/23/2014   CLINICAL DATA:  Altered mental status  EXAM: CT HEAD WITHOUT CONTRAST  TECHNIQUE: Contiguous axial images were obtained from the base of the skull through the vertex without intravenous contrast. Study was obtained within 24 hr patient's arrival the emergency department.  COMPARISON:  Brain CT January 15, 2011 and brain MRI and Apr 17, 2011  FINDINGS: The ventricles are normal in size and configuration. Left lateral ventricle is slightly larger than the right lateral ventricle, an anatomic variant. There is no appreciable mass, hemorrhage, extra-axial fluid collection, or midline shift. Minimal periventricular small vessel disease is stable. There is no new gray-white compartment lesion. No demonstrable acute infarct.  Bony calvarium appears intact. The mastoid air cells are clear. There is bilateral  ethmoid sinus disease.  IMPRESSION: Bilateral ethmoid sinus disease. Minimal periventricular small vessel disease. No intracranial mass, hemorrhage, or acute appearing infarct.   Electronically Signed   By: Lowella Grip M.D.   On: 01/23/2014 15:48   Mr Brain Wo Contrast  01/23/2014   CLINICAL DATA:  Fever, confusion, slurred speech  EXAM: MRI HEAD WITHOUT CONTRAST  TECHNIQUE: Multiplanar, multiecho pulse sequences of the brain and surrounding structures were obtained without intravenous contrast.  COMPARISON:  Prior CT from earlier the same day  FINDINGS: Mild cerebral atrophy Mild scattered T2/FLAIR hyperintensity within the periventricular white matter is present, likely related to chronic microvascular ischemic changes. Focal parenchymal signal abnormality is identified. No mass lesion, midline shift, or extra-axial fluid collection. Ventricles are normal in size without evidence of hydrocephalus.  No diffusion-weighted signal abnormality is identified to suggest acute intracranial infarct. Gray-white matter differentiation is maintained. Normal flow voids are seen within the intracranial vasculature. No intracranial hemorrhage identified.  The cervicomedullary junction is normal. Pituitary gland is within normal limits. Pituitary stalk is midline. The globes and optic nerves demonstrate a normal appearance with normal signal intensity. The  The bone marrow signal intensity is normal. Calvarium is intact. Visualized upper cervical spine is within normal limits.  Scalp soft tissues are unremarkable.  Paranasal sinuses are clear.  No mastoid effusion.  IMPRESSION: 1. No acute intracranial infarct or other abnormality identified. 2. Mild atrophy and chronic microvascular ischemic changes.   Electronically Signed   By: Jeannine Boga M.D.   On: 01/23/2014 20:59   Dg Chest Port 1 View  01/23/2014   CLINICAL DATA:  Altered mental status  EXAM: PORTABLE CHEST - 1 VIEW  COMPARISON:  11/20/2013  FINDINGS:  Cardiomediastinal  silhouette is is stable. No acute infiltrate or pleural effusion. No pulmonary edema. Mild basilar atelectasis.  IMPRESSION: No acute infiltrate or pulmonary edema.  Mild basilar atelectasis.   Electronically Signed   By: Lahoma Crocker M.D.   On: 01/23/2014 14:51    Positive for anxiety, bad mood, bipolar, depression, learning difficulty, mood swings and sleep disturbance Blood pressure 125/79, pulse 67, temperature 98.2 F (36.8 C), temperature source Oral, resp. rate 18, height '5\' 10"'  (1.778 m), weight 106.595 kg (235 lb), SpO2 95.00%.   Assessment/Plan: Bipolar disorder most recent episode unspecified Polysubstance abuse vs polyspharmacy  Recommendation: Check serum lithium level for therapeutic levels Decrease Risperidone 2 mg PO Qhs/sedation Discontinue klonopin due to slurred speech Change Wellburin XL 150 mg PO QD Contiue Trazodone 50 mg PO Qhs/PRN Monitor for adverse effects Appreciate psych consultation and will follow up as clinically required May Call [phone] if needs further assistance  Romeo Zielinski,JANARDHAHA R. 01/24/2014, 3:58 PM

## 2014-01-24 NOTE — Progress Notes (Addendum)
[facility] PROGRESS NOTE  Alan Brown XBM:[id number] DOB: Apr 30, 1951 DOA: 01/23/2014 PCP: Lupe Carney, MD  Assessment/Plan:   Fever  -Patient currently afebrile, had one temperature measurement of  38.3.  Leukocytosis; -Leukocytosis at admission will repeat labs; SIRS?  -Blood cultures NGTD -Urinalysis; trace leukocytosis, no bacteria reported -Negative left shift, or bands will DC all antibiotics; most likely reactive leukocytes   Polysubstance Use. -UDS positive amphetamines (possible false positive; Wellbutrin), barbiturates (possible false positive; naproxen), opiates, benzodiazepines -Psychiatry consult placed to determine competency/medication management -DC Klonopin - Chronic pain syndrome - Per wife patient on multiple medications for chronic back pain secondary to multiple herniated discs. Very concerned that these medications are playing into his episodes of mental status change. -Per wife multiple physicians managing/prescribing different medications for his pain. -Never evaluated by a pain specialist. -At Admission Patient's Neurontin, Robaxin,held (still continues to have cognitive changes) -Will also decrease his Norco dosing to q 8hrs.   Bipolar -Psychiatry consulted to determine competency/medication management -Can we titrate patient off of some of his psychiatric medication?   Depression -See bipolar    R/O TIA vs CVA - MRI brain negative for stroke -  AMS -Most likely secondary to medication abuse -Continue suicide watch until psychiatry clears,  -Possible absence seizures have consulted neurology for EEG   Bacteremia -Blood culture 01/23/2014 positive for gram-positive cocci in clusters -Continue Zosyn+ vancomycin until speciation and sensitivities return    Code Status: Full Family Communication: Patient at bedside for discussion of plan of care Disposition Plan: Resolution acute mental status  change   Consultants: Psychiatry (pending) Dr. Noel Christmas Neurology pending.    Procedures: MRI brain 01/23/2014 1. No acute intracranial infarct or other abnormality identified.  2. Mild atrophy and chronic microvascular ischemic changes.    Antibiotics: Zosyn 2/9>>  Vancomycin 2/9>>     HPI/Subjective: Alan Brown is a 63 y.o WM PMHx , depression, Bipolar, chronic pain syndrome. brought in for AMS, and slurred speech. On talking to the patient pt speech is normal and is oriented to place, person and time. He reports fever and myalgias. On arrival to ED, pt underwent cxr which was negative for pneumonia. UA was negative for infection. UDS ordered and results pending. He is referred to medical service for admission for ams evaluation. 2/10 continued slurred speech interspersed with episodes of staring into space/not answering questions appropriately.   Objective: Filed Vitals:   01/24/14 0003 01/24/14 0404 01/24/14 0800 01/24/14 1140  BP: 105/59 122/85 110/73 125/79  Pulse: 85 68 64 67  Temp: 99 F (37.2 C) 98.6 F (37 C) 99.2 F (37.3 C) 98.2 F (36.8 C)  TempSrc: Oral Oral Oral Oral  Resp: 20 18 17 18   Height:      Weight:      SpO2: 97% 95% 94% 95%    Intake/Output Summary (Last 24 hours) at 01/24/14 1349 Last data filed at 01/24/14 1253  Gross per 24 hour  Intake 2768.33 ml  Output   2475 ml  Net 293.33 ml   Filed Weights   01/23/14 2055  Weight: 106.595 kg (235 lb)    Exam:   General:  A./O. X4, slurred speech, with episodes of staring into space  Cardiovascular: Regular rhythm and rate, negative murmurs rubs gallops  Respiratory: Clear to auscultation bilateral  Abdomen: Soft, nontender, nondistended, plus bowel  Musculoskeletal: Negative pedal edema  Neurologic cranial nerves II through XII intact, extremity strength 5/5, sensation intact, patient with shuffling gait, episodes of staring into  space and then answering questions  appropriately, slurred speech, tongue/uvula midline. Did not evaluate lumbar/pronator drift secondary to patient's unsteadiness on his feet   Data Reviewed: Basic Metabolic Panel:  Recent Labs Lab 01/23/14 1427  NA 137  K 4.1  CL 99  CO2 26  GLUCOSE 132*  BUN 10  CREATININE 0.97  CALCIUM 9.2   Liver Function Tests:  Recent Labs Lab 01/23/14 1427  AST 13  ALT 10  ALKPHOS 56  BILITOT 0.4  PROT 7.4  ALBUMIN 3.5   No results found for this basename: LIPASE, AMYLASE,  in the last 168 hours No results found for this basename: AMMONIA,  in the last 168 hours CBC:  Recent Labs Lab 01/23/14 1427  WBC 13.0*  NEUTROABS 9.6*  HGB 13.3  HCT 39.5  MCV 93.4  PLT 192   Cardiac Enzymes:  Recent Labs Lab 01/23/14 1428  TROPONINI <0.30   BNP (last 3 results)  Recent Labs  01/23/14 1428  PROBNP 63.5   CBG: No results found for this basename: GLUCAP,  in the last 168 hours  Recent Results (from the past 240 hour(s))  CULTURE, BLOOD (ROUTINE X 2)     Status: None   Collection Time    01/23/14  2:48 PM      Result Value Range Status   Specimen Description BLOOD ARM RIGHT   Final   Special Requests BOTTLES DRAWN AEROBIC AND ANAEROBIC 5CC   Final   Culture  Setup Time     Final   Value: 01/23/2014 20:49     Performed at [facility]   Culture     Final   Value:        BLOOD CULTURE RECEIVED NO GROWTH TO DATE CULTURE WILL BE HELD FOR 5 DAYS BEFORE ISSUING A FINAL NEGATIVE REPORT     Performed at [facility]   Report Status PENDING   Incomplete  CULTURE, BLOOD (ROUTINE X 2)     Status: None   Collection Time    01/23/14  3:20 PM      Result Value Range Status   Specimen Description BLOOD HAND RIGHT   Final   Special Requests BOTTLES DRAWN AEROBIC ONLY 10CC   Final   Culture  Setup Time     Final   Value: 01/23/2014 20:50     Performed at [facility]   Culture     Final   Value:        BLOOD CULTURE RECEIVED NO GROWTH TO DATE CULTURE  WILL BE HELD FOR 5 DAYS BEFORE ISSUING A FINAL NEGATIVE REPORT     Performed at [facility]   Report Status PENDING   Incomplete     Studies: Dg Chest 2 View  01/23/2014   CLINICAL DATA:  Fever, evaluate for pneumonia.  EXAM: CHEST  2 VIEW  COMPARISON:  January 23, 2014 2:42 p.m.  FINDINGS: The heart size and mediastinal contours are stable. There is no focal infiltrate, pulmonary edema, or pleural effusion. The visualized skeletal structures are stable.  IMPRESSION: No active cardiopulmonary disease.   Electronically Signed   By: Sherian ReinWei-Chen  Lin M.D.   On: 01/23/2014 19:48   Ct Head Wo Contrast  01/23/2014   CLINICAL DATA:  Altered mental status  EXAM: CT HEAD WITHOUT CONTRAST  TECHNIQUE: Contiguous axial images were obtained from the base of the skull through the vertex without intravenous contrast. Study was obtained within 24 hr patient's arrival the emergency department.  COMPARISON:  Brain CT January 15, 2011 and brain MRI and Apr 17, 2011  FINDINGS: The ventricles are normal in size and configuration. Left lateral ventricle is slightly larger than the right lateral ventricle, an anatomic variant. There is no appreciable mass, hemorrhage, extra-axial fluid collection, or midline shift. Minimal periventricular small vessel disease is stable. There is no new gray-white compartment lesion. No demonstrable acute infarct.  Bony calvarium appears intact. The mastoid air cells are clear. There is bilateral ethmoid sinus disease.  IMPRESSION: Bilateral ethmoid sinus disease. Minimal periventricular small vessel disease. No intracranial mass, hemorrhage, or acute appearing infarct.   Electronically Signed   By: Bretta Bang M.D.   On: 01/23/2014 15:48   Mr Brain Wo Contrast  01/23/2014   CLINICAL DATA:  Fever, confusion, slurred speech  EXAM: MRI HEAD WITHOUT CONTRAST  TECHNIQUE: Multiplanar, multiecho pulse sequences of the brain and surrounding structures were obtained without intravenous  contrast.  COMPARISON:  Prior CT from earlier the same day  FINDINGS: Mild cerebral atrophy Mild scattered T2/FLAIR hyperintensity within the periventricular white matter is present, likely related to chronic microvascular ischemic changes. Focal parenchymal signal abnormality is identified. No mass lesion, midline shift, or extra-axial fluid collection. Ventricles are normal in size without evidence of hydrocephalus.  No diffusion-weighted signal abnormality is identified to suggest acute intracranial infarct. Gray-white matter differentiation is maintained. Normal flow voids are seen within the intracranial vasculature. No intracranial hemorrhage identified.  The cervicomedullary junction is normal. Pituitary gland is within normal limits. Pituitary stalk is midline. The globes and optic nerves demonstrate a normal appearance with normal signal intensity. The  The bone marrow signal intensity is normal. Calvarium is intact. Visualized upper cervical spine is within normal limits.  Scalp soft tissues are unremarkable.  Paranasal sinuses are clear.  No mastoid effusion.  IMPRESSION: 1. No acute intracranial infarct or other abnormality identified. 2. Mild atrophy and chronic microvascular ischemic changes.   Electronically Signed   By: Rise Mu M.D.   On: 01/23/2014 20:59   Dg Chest Port 1 View  01/23/2014   CLINICAL DATA:  Altered mental status  EXAM: PORTABLE CHEST - 1 VIEW  COMPARISON:  11/20/2013  FINDINGS: Cardiomediastinal silhouette is is stable. No acute infiltrate or pleural effusion. No pulmonary edema. Mild basilar atelectasis.  IMPRESSION: No acute infiltrate or pulmonary edema.  Mild basilar atelectasis.   Electronically Signed   By: Natasha Mead M.D.   On: 01/23/2014 14:51    Scheduled Meds: . aspirin EC  81 mg Oral Daily  . buPROPion  150 mg Oral BID  . clonazePAM  0.5 mg Oral Daily  . enoxaparin (LOVENOX) injection  40 mg Subcutaneous Q24H  . famotidine  20 mg Oral Daily  .  lithium carbonate  900 mg Oral QHS  . piperacillin-tazobactam (ZOSYN)  IV  3.375 g Intravenous Q8H  . primidone  100 mg Oral QHS  . risperidone  4 mg Oral Daily  . sodium chloride  3 mL Intravenous Q12H  . vancomycin  1,000 mg Intravenous Q12H   Continuous Infusions: . sodium chloride 125 mL/hr at 01/24/14 1018    Active Problems:   Acute encephalopathy   Bipolar 1 disorder   Sepsis   Altered mental status   Depression   Polysubstance (including opioids) dependence, daily use   Chronic pain syndrome    Time spent: 60 minute   WOODS, CURTIS, J  [facility] Pager [phone]. If 7PM-7AM, please contact night-coverage at [email or web address], password [facility] 01/24/2014,  1:49 PM  LOS: 1 day

## 2014-01-24 NOTE — Consult Note (Signed)
NEURO HOSPITALIST CONSULT NOTE    Reason for Consult: Intermittent lethargy and confusion.  HPI:                                                                                                                                          Alan Brown is an 63 y.o. male who has been seen in the hospital for similar symptoms of intermittent confusion, lethargy and periods of unresponsives dating back to 01/2011. At that time neurology found exam very consistent with toxic metabolic encephalopathy consistent with drug overdose. [facility] thus many of [facility] are not available.  Per wife, he is seen by a neurologist at the [place] who has not been able to find the cause.  They believe it may be due to medication SE but also have brought up th idea this could be seizure.  When talking to the wife she describes these episodes as periods where he will be hard to arouse, or he will be wondering around the house or mumbling.  "they can last for days up to even months at a time".  She and patient deny any illicit drug use or over use of his medications.  At home he is on Wellbutrin, klonopin, hydrocodone, lithium, Risperdal and Trazodone. Currently he is awake and oriented with dysarthric voice.   Past Medical History  Diagnosis Date  . Arthritis   . Depression   . GERD (gastroesophageal reflux disease)   . Bipolar 1 disorder   . Prolapse, disk     lower back  . Depression     Past Surgical History  Procedure Laterality Date  . Appendectomy  1961  . Tonsillectomy  1976  . Inguinal hernia repair  1998    left    Family History  Problem Relation Age of Onset  . Hypertension Mother   . Hypertension Father      Social History:  reports that he has quit smoking. He has never used smokeless tobacco. He reports that he does not drink alcohol or use illicit drugs.  Allergies  Allergen Reactions  . Naproxen Other (See Comments)    Increases lithium  toxicity.     MEDICATIONS:  Prior to Admission:  Prescriptions prior to admission  Medication Sig Dispense Refill  . aspirin EC 81 MG tablet Take 81 mg by mouth daily.      Marland Kitchen buPROPion (WELLBUTRIN SR) 150 MG 12 hr tablet Take 150 mg by mouth 2 (two) times daily.       . Calcium Carb-Cholecalciferol (CALCIUM 600 + D PO) Take 1 tablet by mouth 2 (two) times daily.      . clonazePAM (KLONOPIN) 1 MG tablet Take 0.5 mg by mouth daily.      . fish oil-omega-3 fatty acids 1000 MG capsule Take 3 g by mouth 2 (two) times daily.       Marland Kitchen gabapentin (NEURONTIN) 300 MG capsule Take 300 mg by mouth 4 (four) times daily.      Marland Kitchen HYDROcodone Bitartrate ER 40 MG CP12 Take 40 mg by mouth 2 (two) times daily.      Marland Kitchen ketoconazole (NIZORAL) 2 % shampoo Apply 1 application topically 3 (three) times a week.      . lithium carbonate (ESKALITH) 450 MG CR tablet Take 900 mg by mouth at bedtime.      . naproxen (NAPROSYN) 375 MG tablet Take 375 mg by mouth 2 (two) times daily with a meal.      . primidone (MYSOLINE) 50 MG tablet Take 100 mg by mouth daily.       . ranitidine (ZANTAC) 150 MG tablet Take 150 mg by mouth 2 (two) times daily.       . risperidone (RISPERDAL) 4 MG tablet Take 4 mg by mouth daily.      . traZODone (DESYREL) 50 MG tablet Take 50 mg by mouth at bedtime as needed for sleep.       Scheduled: . aspirin EC  81 mg Oral Daily  . buPROPion  150 mg Oral BID  . clonazePAM  0.5 mg Oral Daily  . enoxaparin (LOVENOX) injection  40 mg Subcutaneous Q24H  . famotidine  20 mg Oral Daily  . lithium carbonate  900 mg Oral QHS  . piperacillin-tazobactam (ZOSYN)  IV  3.375 g Intravenous Q8H  . primidone  100 mg Oral QHS  . risperidone  4 mg Oral Daily  . sodium chloride  3 mL Intravenous Q12H  . vancomycin  1,000 mg Intravenous Q12H     ROS:                                                                                                                                        History obtained from the patient  General ROS: negative for - chills, fatigue, fever, night sweats, weight gain or weight loss Psychological ROS: negative for - behavioral disorder, hallucinations, memory difficulties, mood swings or suicidal ideation Ophthalmic ROS: negative for - blurry vision, double vision, eye pain or loss of vision ENT ROS: negative for - epistaxis, nasal discharge, oral lesions, sore throat, tinnitus  or vertigo Allergy and Immunology ROS: negative for - hives or itchy/watery eyes Hematological and Lymphatic ROS: negative for - bleeding problems, bruising or swollen lymph nodes Endocrine ROS: negative for - galactorrhea, hair pattern changes, polydipsia/polyuria or temperature intolerance Respiratory ROS: negative for - cough, hemoptysis, shortness of breath or wheezing Cardiovascular ROS: negative for - chest pain, dyspnea on exertion, edema or irregular heartbeat Gastrointestinal ROS: negative for - abdominal pain, diarrhea, hematemesis, nausea/vomiting or stool incontinence Genito-Urinary ROS: negative for - dysuria, hematuria, incontinence or urinary frequency/urgency Musculoskeletal ROS: negative for - joint swelling or muscular weakness Neurological ROS: as noted in HPI Dermatological ROS: negative for rash and skin lesion changes   Blood pressure 125/79, pulse 67, temperature 98.2 F (36.8 C), temperature source Oral, resp. rate 18, height 5\' 10"  (1.778 m), weight 106.595 kg (235 lb), SpO2 95.00%.   Neurologic Examination:                                                                                                      Mental Status: Alert, oriented, thought content appropriate.  Speech fluent without evidence of aphasia.  Able to follow 3 step commands without difficulty. Cranial Nerves: II: Discs flat bilaterally; Visual fields grossly normal, pupils equal, round,  reactive to light and accommodation III,IV, VI: ptosis not present, extra-ocular motions intact bilaterally V,VII: smile symmetric, facial light touch sensation normal bilaterally VIII: hearing normal bilaterally IX,X: gag reflex present XI: bilateral shoulder shrug XII: midline tongue extension without atrophy or fasciculations  Motor: Right : Upper extremity   5/5    Left:     Upper extremity   5/5  Lower extremity   5/5     Lower extremity   5/5 Tone and bulk:normal tone throughout; no atrophy noted Sensory: Pinprick and light touch intact throughout, bilaterally Deep Tendon Reflexes:  Right: Upper Extremity   Left: Upper extremity   biceps (C-5 to C-6) 2/4   biceps (C-5 to C-6) 2/4 tricep (C7) 2/4    triceps (C7) 2/4 Brachioradialis (C6) 2/4  Brachioradialis (C6) 2/4  Lower Extremity Lower Extremity  quadriceps (L-2 to L-4) 2/4   quadriceps (L-2 to L-4) 2/4 Achilles (S1) 2/4   Achilles (S1) 2/4  Plantars: Right: downgoing   Left: downgoing Cerebellar: normal finger-to-nose,  normal heel-to-shin test Gait: normal CV: pulses palpable throughout    Lab Results: Basic Metabolic Panel:  Recent Labs Lab 01/23/14 1427 01/24/14 1404  NA 137 143  K 4.1 4.0  CL 99 107  CO2 26 23  GLUCOSE 132* 171*  BUN 10 9  CREATININE 0.97 0.87  CALCIUM 9.2 8.7    Liver Function Tests:  Recent Labs Lab 01/23/14 1427 01/24/14 1404  AST 13 15  ALT 10 11  ALKPHOS 56 51  BILITOT 0.4 0.4  PROT 7.4 7.4  ALBUMIN 3.5 3.4*   No results found for this basename: LIPASE, AMYLASE,  in the last 168 hours No results found for this basename: AMMONIA,  in the last 168 hours  CBC:  Recent Labs Lab 01/23/14 1427 01/24/14 1404  WBC 13.0*  8.2  NEUTROABS 9.6* 5.7  HGB 13.3 12.8*  HCT 39.5 38.7*  MCV 93.4 94.4  PLT 192 180    Cardiac Enzymes:  Recent Labs Lab 01/23/14 1428  TROPONINI <0.30    Lipid Panel: No results found for this basename: CHOL, TRIG, HDL, CHOLHDL, VLDL,  LDLCALC,  in the last 168 hours  CBG: No results found for this basename: GLUCAP,  in the last 168 hours  Microbiology: Results for orders placed during the hospital encounter of 01/23/14  CULTURE, BLOOD (ROUTINE X 2)     Status: None   Collection Time    01/23/14  2:48 PM      Result Value Range Status   Specimen Description BLOOD ARM RIGHT   Final   Special Requests BOTTLES DRAWN AEROBIC AND ANAEROBIC 5CC   Final   Culture  Setup Time     Final   Value: 01/23/2014 20:49     Performed at [facility]   Culture     Final   Value:        BLOOD CULTURE RECEIVED NO GROWTH TO DATE CULTURE WILL BE HELD FOR 5 DAYS BEFORE ISSUING A FINAL NEGATIVE REPORT     Performed at [facility]   Report Status PENDING   Incomplete  CULTURE, BLOOD (ROUTINE X 2)     Status: None   Collection Time    01/23/14  3:20 PM      Result Value Range Status   Specimen Description BLOOD HAND RIGHT   Final   Special Requests BOTTLES DRAWN AEROBIC ONLY 10CC   Final   Culture  Setup Time     Final   Value: 01/23/2014 20:50     Performed at [facility]   Culture     Final   Value:        BLOOD CULTURE RECEIVED NO GROWTH TO DATE CULTURE WILL BE HELD FOR 5 DAYS BEFORE ISSUING A FINAL NEGATIVE REPORT     Performed at [facility]   Report Status PENDING   Incomplete    Coagulation Studies: No results found for this basename: LABPROT, INR,  in the last 72 hours  Imaging: Dg Chest 2 View  01/23/2014   CLINICAL DATA:  Fever, evaluate for pneumonia.  EXAM: CHEST  2 VIEW  COMPARISON:  January 23, 2014 2:42 p.m.  FINDINGS: The heart size and mediastinal contours are stable. There is no focal infiltrate, pulmonary edema, or pleural effusion. The visualized skeletal structures are stable.  IMPRESSION: No active cardiopulmonary disease.   Electronically Signed   By: Sherian ReinWei-Chen  Lin M.D.   On: 01/23/2014 19:48   Ct Head Wo Contrast  01/23/2014   CLINICAL DATA:  Altered mental status  EXAM: CT  HEAD WITHOUT CONTRAST  TECHNIQUE: Contiguous axial images were obtained from the base of the skull through the vertex without intravenous contrast. Study was obtained within 24 hr patient's arrival the emergency department.  COMPARISON:  Brain CT January 15, 2011 and brain MRI and Apr 17, 2011  FINDINGS: The ventricles are normal in size and configuration. Left lateral ventricle is slightly larger than the right lateral ventricle, an anatomic variant. There is no appreciable mass, hemorrhage, extra-axial fluid collection, or midline shift. Minimal periventricular small vessel disease is stable. There is no new gray-white compartment lesion. No demonstrable acute infarct.  Bony calvarium appears intact. The mastoid air cells are clear. There is bilateral ethmoid sinus disease.  IMPRESSION: Bilateral ethmoid sinus disease. Minimal periventricular  small vessel disease. No intracranial mass, hemorrhage, or acute appearing infarct.   Electronically Signed   By: Bretta Bang M.D.   On: 01/23/2014 15:48   Mr Brain Wo Contrast  01/23/2014   CLINICAL DATA:  Fever, confusion, slurred speech  EXAM: MRI HEAD WITHOUT CONTRAST  TECHNIQUE: Multiplanar, multiecho pulse sequences of the brain and surrounding structures were obtained without intravenous contrast.  COMPARISON:  Prior CT from earlier the same day  FINDINGS: Mild cerebral atrophy Mild scattered T2/FLAIR hyperintensity within the periventricular white matter is present, likely related to chronic microvascular ischemic changes. Focal parenchymal signal abnormality is identified. No mass lesion, midline shift, or extra-axial fluid collection. Ventricles are normal in size without evidence of hydrocephalus.  No diffusion-weighted signal abnormality is identified to suggest acute intracranial infarct. Gray-white matter differentiation is maintained. Normal flow voids are seen within the intracranial vasculature. No intracranial hemorrhage identified.  The  cervicomedullary junction is normal. Pituitary gland is within normal limits. Pituitary stalk is midline. The globes and optic nerves demonstrate a normal appearance with normal signal intensity. The  The bone marrow signal intensity is normal. Calvarium is intact. Visualized upper cervical spine is within normal limits.  Scalp soft tissues are unremarkable.  Paranasal sinuses are clear.  No mastoid effusion.  IMPRESSION: 1. No acute intracranial infarct or other abnormality identified. 2. Mild atrophy and chronic microvascular ischemic changes.   Electronically Signed   By: Rise Mu M.D.   On: 01/23/2014 20:59   Dg Chest Port 1 View  01/23/2014   CLINICAL DATA:  Altered mental status  EXAM: PORTABLE CHEST - 1 VIEW  COMPARISON:  11/20/2013  FINDINGS: Cardiomediastinal silhouette is is stable. No acute infiltrate or pleural effusion. No pulmonary edema. Mild basilar atelectasis.  IMPRESSION: No acute infiltrate or pulmonary edema.  Mild basilar atelectasis.   Electronically Signed   By: Natasha Mead M.D.   On: 01/23/2014 14:51    Felicie Morn PA-C [facility] [phone]  01/24/2014, 3:39 PM   Assessment/Plan: 63 YO male with long history of periods which he will be confused, hard to arouse, slurred speech.  Patient is on multiple psychotropic medications including Klonopin , Risperdal and Lithium. periods of confusion can last from days to months. Likely medication SE or miss use however cannot exclude lithium toxicity or less likely seizure.    Recommend: 1) Lithium level 2) EEG 3) Agree with psych evaluation for medication reconciliation.   I personally participate in this patient's evaluation and management, including neurological examination, as well as formulating the above clinical assessment and management recommendations.  Venetia Maxon M.D. [facility] [phone]

## 2014-01-25 ENCOUNTER — Inpatient Hospital Stay (HOSPITAL_COMMUNITY): Payer: Non-veteran care

## 2014-01-25 DIAGNOSIS — A419 Sepsis, unspecified organism: Secondary | ICD-10-CM

## 2014-01-25 LAB — LITHIUM LEVEL: Lithium Lvl: 0.34 mEq/L — ABNORMAL LOW (ref 0.80–1.40)

## 2014-01-25 MED ORDER — TOBRAMYCIN 0.3 % OP SOLN
1.0000 [drp] | OPHTHALMIC | Status: DC
  Administered 2014-01-25 – 2014-01-27 (×12): 1 [drp] via OPHTHALMIC
  Filled 2014-01-25: qty 5

## 2014-01-25 NOTE — Progress Notes (Addendum)
Subjective: Patient repots that he is back to baseline.  Has seen the psychiatrist and medication changes have been recommended.    Objective: Current vital signs: BP 147/77  Pulse 64  Temp(Src) 98.1 F (36.7 C) (Oral)  Resp 18  Ht 5\' 10"  (1.778 m)  Wt 106.595 kg (235 lb)  BMI 33.72 kg/m2  SpO2 97% Vital signs in last 24 hours: Temp:  [97.2 F (36.2 C)-98.4 F (36.9 C)] 98.1 F (36.7 C) (02/11 1238) Pulse Rate:  [62-86] 64 (02/11 1238) Resp:  [17-18] 18 (02/11 1238) BP: (120-147)/(72-77) 147/77 mmHg (02/11 1238) SpO2:  [95 %-97 %] 97 % (02/11 1238)  Intake/Output from previous day: 02/10 0701 - 02/11 0700 In: 3440 [P.O.:3440] Out: 3900 [Urine:3900] Intake/Output this shift: Total I/O In: 420 [P.O.:420] Out: -  Nutritional status: General  Neurologic Exam: Mental Status:  Alert, oriented, thought content appropriate. Speech fluent without evidence of aphasia. Able to follow 3 step commands without difficulty.  Cranial Nerves:  II: Discs flat bilaterally; Visual fields grossly normal, pupils equal, round, reactive to light and accommodation  III,IV, VI: ptosis not present, extra-ocular motions intact bilaterally  V,VII: smile symmetric, facial light touch sensation normal bilaterally  VIII: hearing normal bilaterally  IX,X: gag reflex present  XI: bilateral shoulder shrug  XII: midline tongue extension without atrophy or fasciculations  Motor:  5/5 throughout.  Mild BUE tremor. Tone and bulk:normal tone throughout; no atrophy noted  Sensory: Pinprick and light touch intact throughout, bilaterally  Deep Tendon Reflexes:  2+ throughout  Plantars:  Right: downgoing Left: downgoing  Cerebellar:  normal finger-to-nose, normal heel-to-shin test   Lab Results: Basic Metabolic Panel:  Recent Labs Lab 01/23/14 1427 01/24/14 1404  NA 137 143  K 4.1 4.0  CL 99 107  CO2 26 23  GLUCOSE 132* 171*  BUN 10 9  CREATININE 0.97 0.87  CALCIUM 9.2 8.7    Liver  Function Tests:  Recent Labs Lab 01/23/14 1427 01/24/14 1404  AST 13 15  ALT 10 11  ALKPHOS 56 51  BILITOT 0.4 0.4  PROT 7.4 7.4  ALBUMIN 3.5 3.4*   No results found for this basename: LIPASE, AMYLASE,  in the last 168 hours No results found for this basename: AMMONIA,  in the last 168 hours  CBC:  Recent Labs Lab 01/23/14 1427 01/24/14 1404  WBC 13.0* 8.2  NEUTROABS 9.6* 5.7  HGB 13.3 12.8*  HCT 39.5 38.7*  MCV 93.4 94.4  PLT 192 180    Cardiac Enzymes:  Recent Labs Lab 01/23/14 1428  TROPONINI <0.30    Lipid Panel: No results found for this basename: CHOL, TRIG, HDL, CHOLHDL, VLDL, LDLCALC,  in the last 168 hours  CBG: No results found for this basename: GLUCAP,  in the last 168 hours  Microbiology: Results for orders placed during the hospital encounter of 01/23/14  CULTURE, BLOOD (ROUTINE X 2)     Status: None   Collection Time    01/23/14  2:48 PM      Result Value Ref Range Status   Specimen Description BLOOD ARM RIGHT   Final   Special Requests BOTTLES DRAWN AEROBIC AND ANAEROBIC 5CC   Final   Culture  Setup Time     Final   Value: 01/23/2014 20:49     Performed at [facility]   Culture     Final   Value: GRAM POSITIVE COCCI IN CLUSTERS     10 Note: Gram Stain Report Called to,Read Back By  and Verified With: CHRISSY DAVIS RN 2 15 620P EDMOJ     Performed at [facility]   Report Status PENDING   Incomplete  URINE CULTURE     Status: None   Collection Time    01/23/14  3:05 PM      Result Value Ref Range Status   Specimen Description URINE, CLEAN CATCH   Final   Special Requests NONE   Final   Culture  Setup Time     Final   Value: 01/23/2014 20:54     Performed at [facility] Count     Final   Value: NO GROWTH     Performed at [facility]   Culture     Final   Value: NO GROWTH     Performed at [facility]   Report Status 01/24/2014 FINAL   Final  CULTURE, BLOOD (ROUTINE X 2)      Status: None   Collection Time    01/23/14  3:20 PM      Result Value Ref Range Status   Specimen Description BLOOD HAND RIGHT   Final   Special Requests BOTTLES DRAWN AEROBIC ONLY 10CC   Final   Culture  Setup Time     Final   Value: 01/23/2014 20:50     Performed at [facility]   Culture     Final   Value:        BLOOD CULTURE RECEIVED NO GROWTH TO DATE CULTURE WILL BE HELD FOR 5 DAYS BEFORE ISSUING A FINAL NEGATIVE REPORT     Performed at [facility]   Report Status PENDING   Incomplete    Coagulation Studies: No results found for this basename: LABPROT, INR,  in the last 72 hours  Imaging: Dg Chest 2 View  01/23/2014   CLINICAL DATA:  Fever, evaluate for pneumonia.  EXAM: CHEST  2 VIEW  COMPARISON:  January 23, 2014 2:42 p.m.  FINDINGS: The heart size and mediastinal contours are stable. There is no focal infiltrate, pulmonary edema, or pleural effusion. The visualized skeletal structures are stable.  IMPRESSION: No active cardiopulmonary disease.   Electronically Signed   By: Sherian Rein M.D.   On: 01/23/2014 19:48   Ct Head Wo Contrast  01/23/2014   CLINICAL DATA:  Altered mental status  EXAM: CT HEAD WITHOUT CONTRAST  TECHNIQUE: Contiguous axial images were obtained from the base of the skull through the vertex without intravenous contrast. Study was obtained within 24 hr patient's arrival the emergency department.  COMPARISON:  Brain CT January 15, 2011 and brain MRI and Apr 17, 2011  FINDINGS: The ventricles are normal in size and configuration. Left lateral ventricle is slightly larger than the right lateral ventricle, an anatomic variant. There is no appreciable mass, hemorrhage, extra-axial fluid collection, or midline shift. Minimal periventricular small vessel disease is stable. There is no new gray-white compartment lesion. No demonstrable acute infarct.  Bony calvarium appears intact. The mastoid air cells are clear. There is bilateral ethmoid sinus disease.   IMPRESSION: Bilateral ethmoid sinus disease. Minimal periventricular small vessel disease. No intracranial mass, hemorrhage, or acute appearing infarct.   Electronically Signed   By: Bretta Bang M.D.   On: 01/23/2014 15:48   Mr Brain Wo Contrast  01/23/2014   CLINICAL DATA:  Fever, confusion, slurred speech  EXAM: MRI HEAD WITHOUT CONTRAST  TECHNIQUE: Multiplanar, multiecho pulse sequences of the brain and surrounding structures were obtained without  intravenous contrast.  COMPARISON:  Prior CT from earlier the same day  FINDINGS: Mild cerebral atrophy Mild scattered T2/FLAIR hyperintensity within the periventricular white matter is present, likely related to chronic microvascular ischemic changes. Focal parenchymal signal abnormality is identified. No mass lesion, midline shift, or extra-axial fluid collection. Ventricles are normal in size without evidence of hydrocephalus.  No diffusion-weighted signal abnormality is identified to suggest acute intracranial infarct. Gray-white matter differentiation is maintained. Normal flow voids are seen within the intracranial vasculature. No intracranial hemorrhage identified.  The cervicomedullary junction is normal. Pituitary gland is within normal limits. Pituitary stalk is midline. The globes and optic nerves demonstrate a normal appearance with normal signal intensity. The  The bone marrow signal intensity is normal. Calvarium is intact. Visualized upper cervical spine is within normal limits.  Scalp soft tissues are unremarkable.  Paranasal sinuses are clear.  No mastoid effusion.  IMPRESSION: 1. No acute intracranial infarct or other abnormality identified. 2. Mild atrophy and chronic microvascular ischemic changes.   Electronically Signed   By: Rise MuBenjamin  McClintock M.D.   On: 01/23/2014 20:59   Dg Chest Port 1 View  01/23/2014   CLINICAL DATA:  Altered mental status  EXAM: PORTABLE CHEST - 1 VIEW  COMPARISON:  11/20/2013  FINDINGS: Cardiomediastinal  silhouette is is stable. No acute infiltrate or pleural effusion. No pulmonary edema. Mild basilar atelectasis.  IMPRESSION: No acute infiltrate or pulmonary edema.  Mild basilar atelectasis.   Electronically Signed   By: Natasha MeadLiviu  Pop M.D.   On: 01/23/2014 14:51    Medications:  I have reviewed the patient's current medications. Scheduled: . aspirin EC  81 mg Oral Daily  . buPROPion  150 mg Oral Daily  . enoxaparin (LOVENOX) injection  40 mg Subcutaneous Q24H  . famotidine  20 mg Oral Daily  . lithium carbonate  900 mg Oral QHS  . piperacillin-tazobactam (ZOSYN)  IV  3.375 g Intravenous 3 times per day  . primidone  100 mg Oral QHS  . risperiDONE  2 mg Oral Daily  . sodium chloride  3 mL Intravenous Q12H  . vancomycin  1,000 mg Intravenous Q12H    Assessment/Plan: 63 year old male with episodes of confusion.  Seems now back to baseline.  Psychiatry has evaluated his medications.  Imaging unremarkable.  EEG pending.    Recommendations: 1.  Will follow up results of EEG. 2.  If patient remains at baseline would consider discharge since it may take some time to determine if the medications changes are helpful.    Case discussed with Dr. Isidoro Donningai   LOS: 2 days   Thana FarrLeslie Torry Istre, MD [facility] [phone] 01/25/2014  1:06 PM

## 2014-01-25 NOTE — Progress Notes (Signed)
EEG completed; results pending.    

## 2014-01-25 NOTE — Consult Note (Signed)
Reason for Consult: Medication management of bipolar disorder Referring Physician: Dr. Krystal Eaton Alan Brown is an 63 y.o. male.  HPI: Patient is seen and chart reviewed. Case is discussed with his wife and Dr. Sherral Hammers. Patient and his wife stated that he has episodes of confusion, disorientation, slurred speech, increased sleepiness and unstable motor activities, lapses of time, forgetful and incoherent speech. Patient has been diagnosed with bipolar disorder, depression, and received medication from [facility] center. He was a [place] era veteran and he was in [place] and [place] in [phone]. He has no recent psych admission. He was received gabapentin and hydrocodone for pain in the last year. He is 100% disabled veteran. He has limited interaction with twin children (one boy and one girl) who five years old because of increased amount of sleep.   Interval History: Patient has no complaints today. Patient stated medication adjustment has been working better for him and stated he has been not confused, thinking clear and has no slurred speech. He has been awake, alert and oriented, and has a spontaneous speech without pressured. Patient has anxiety about needles and required IV team to get access for possible antibiotic treatment. Review of labs indicated he has a subtherapeutic level of lithium levels and may increase to a higher dose if patient becomes emotionally unstable during this hospitalization. Patient stated his wife brought his young twinchildren this morning and then left work and he stated his missing them and wanted to go home as soon as he can. Patient denies suicidal or homicidal ideation and has no evidence of psychosis.    Mental Status Examination: Patient is calm and cooperative. He has normal psychomotor activity and has fair eye contact. Patient has sad  mood and his affect was constricted. He has normal rate, rhythm, and volume of speech. His thought process is  linear and goal directed. Patient has denied suicidal, homicidal ideations, intentions or plans. Patient has no evidence of auditory or visual hallucinations, delusions, and paranoia. Patient has fair insight judgment and impulse control.  Past Medical History  Diagnosis Date  . Arthritis   . Depression   . GERD (gastroesophageal reflux disease)   . Bipolar 1 disorder   . Prolapse, disk     lower back  . Depression     Past Surgical History  Procedure Laterality Date  . Appendectomy  1961  . Tonsillectomy  1976  . Inguinal hernia repair  1998    left    Family History  Problem Relation Age of Onset  . Hypertension Mother   . Hypertension Father     Social History:  reports that he has quit smoking. He has never used smokeless tobacco. He reports that he does not drink alcohol or use illicit drugs.  Allergies:  Allergies  Allergen Reactions  . Naproxen Other (See Comments)    Increases lithium toxicity.     Medications: I have reviewed the patient's current medications.  Results for orders placed during the hospital encounter of 01/23/14 (from the past 48 hour(s))  CULTURE, BLOOD (ROUTINE X 2)     Status: None   Collection Time    01/23/14  2:48 PM      Result Value Ref Range   Specimen Description BLOOD ARM RIGHT     Special Requests BOTTLES DRAWN AEROBIC AND ANAEROBIC 5CC     Culture  Setup Time       Value: 01/23/2014 20:49     Performed at [facility]  Culture       Value: GRAM POSITIVE COCCI IN CLUSTERS     10 Note: Gram Stain Report Called to,Read Back By and Verified With: [place] RN 2 15 620P EDMOJ     Performed at [facility]   Report Status PENDING    CG4 I-STAT (LACTIC ACID)     Status: None   Collection Time    01/23/14  3:04 PM      Result Value Ref Range   Lactic Acid, Venous 0.84  0.5 - 2.2 mmol/L  URINALYSIS, ROUTINE W REFLEX MICROSCOPIC     Status: Abnormal   Collection Time    01/23/14  3:05 PM      Result Value  Ref Range   Color, Urine YELLOW  YELLOW   APPearance CLEAR  CLEAR   Specific Gravity, Urine 1.024  1.005 - 1.030   pH 6.5  5.0 - 8.0   Glucose, UA NEGATIVE  NEGATIVE mg/dL   Hgb urine dipstick NEGATIVE  NEGATIVE   Bilirubin Urine NEGATIVE  NEGATIVE   Ketones, ur NEGATIVE  NEGATIVE mg/dL   Protein, ur NEGATIVE  NEGATIVE mg/dL   Urobilinogen, UA 0.2  0.0 - 1.0 mg/dL   Nitrite NEGATIVE  NEGATIVE   Leukocytes, UA TRACE (*) NEGATIVE  URINE CULTURE     Status: None   Collection Time    01/23/14  3:05 PM      Result Value Ref Range   Specimen Description URINE, CLEAN CATCH     Special Requests NONE     Culture  Setup Time       Value: 01/23/2014 20:54     Performed at [facility]       Value: NO GROWTH     Performed at [facility]   Culture       Value: NO GROWTH     Performed at [facility]   Report Status 01/24/2014 FINAL    URINE MICROSCOPIC-ADD ON     Status: Abnormal   Collection Time    01/23/14  3:05 PM      Result Value Ref Range   Squamous Epithelial / LPF FEW (*) RARE   WBC, UA 3-6  <3 WBC/hpf   RBC / HPF 0-2  <3 RBC/hpf   Bacteria, UA RARE  RARE  URINE RAPID DRUG SCREEN (HOSP PERFORMED)     Status: Abnormal   Collection Time    01/23/14  3:05 PM      Result Value Ref Range   Opiates POSITIVE (*) NONE DETECTED   Cocaine NONE DETECTED  NONE DETECTED   Benzodiazepines POSITIVE (*) NONE DETECTED   Amphetamines POSITIVE (*) NONE DETECTED   Tetrahydrocannabinol NONE DETECTED  NONE DETECTED   Barbiturates POSITIVE (*) NONE DETECTED   Comment:            DRUG SCREEN FOR MEDICAL PURPOSES     ONLY.  IF CONFIRMATION IS NEEDED     FOR ANY PURPOSE, NOTIFY LAB     WITHIN 5 DAYS.                LOWEST DETECTABLE LIMITS     FOR URINE DRUG SCREEN     Drug Class       Cutoff (ng/mL)     Amphetamine      1000     Barbiturate      200     Benzodiazepine   809     Tricyclics  300     Opiates          300     Cocaine           300     THC              50  CULTURE, BLOOD (ROUTINE X 2)     Status: None   Collection Time    01/23/14  3:20 PM      Result Value Ref Range   Specimen Description BLOOD HAND RIGHT     Special Requests BOTTLES DRAWN AEROBIC ONLY 10CC     Culture  Setup Time       Value: 01/23/2014 20:50     Performed at [facility]   Culture       Value:        BLOOD CULTURE RECEIVED NO GROWTH TO DATE CULTURE WILL BE HELD FOR 5 DAYS BEFORE ISSUING A FINAL NEGATIVE REPORT     Performed at [facility]   Report Status PENDING    INFLUENZA PANEL BY PCR (TYPE A & B, H1N1)     Status: None   Collection Time    01/23/14  5:21 PM      Result Value Ref Range   Influenza A By PCR NEGATIVE  NEGATIVE   Influenza B By PCR NEGATIVE  NEGATIVE   H1N1 flu by pcr NOT DETECTED  NOT DETECTED   Comment:            The Xpert Flu assay (FDA approved for     nasal aspirates or washes and     nasopharyngeal swab specimens), is     intended as an aid in the diagnosis of     influenza and should not be used as     a sole basis for treatment.  ETHANOL     Status: None   Collection Time    01/23/14 10:28 PM      Result Value Ref Range   Alcohol, Ethyl (B) <11  0 - 11 mg/dL   Comment:            LOWEST DETECTABLE LIMIT FOR     SERUM ALCOHOL IS 11 mg/dL     FOR MEDICAL PURPOSES ONLY  TSH     Status: None   Collection Time    01/24/14 12:00 AM      Result Value Ref Range   TSH 0.358  0.350 - 4.500 uIU/mL   Comment: Performed at [facility]  CBC WITH DIFFERENTIAL     Status: Abnormal   Collection Time    01/24/14  2:04 PM      Result Value Ref Range   WBC 8.2  4.0 - 10.5 K/uL   RBC 4.10 (*) 4.22 - 5.81 MIL/uL   Hemoglobin 12.8 (*) 13.0 - 17.0 g/dL   HCT 38.7 (*) 39.0 - 52.0 %   MCV 94.4  78.0 - 100.0 fL   MCH 31.2  26.0 - 34.0 pg   MCHC 33.1  30.0 - 36.0 g/dL   RDW 13.4  11.5 - 15.5 %   Platelets 180  150 - 400 K/uL   Neutrophils Relative % 70  43 - 77 %   Neutro Abs 5.7  1.7 -  7.7 K/uL   Lymphocytes Relative 21  12 - 46 %   Lymphs Abs 1.8  0.7 - 4.0 K/uL   Monocytes Relative 5  3 - 12 %   Monocytes Absolute 0.4  0.1 - 1.0 K/uL   Eosinophils Relative 3  0 - 5 %   Eosinophils Absolute 0.3  0.0 - 0.7 K/uL   Basophils Relative 0  0 - 1 %   Basophils Absolute 0.0  0.0 - 0.1 K/uL  COMPREHENSIVE METABOLIC PANEL     Status: Abnormal   Collection Time    01/24/14  2:04 PM      Result Value Ref Range   Sodium 143  137 - 147 mEq/L   Potassium 4.0  3.7 - 5.3 mEq/L   Chloride 107  96 - 112 mEq/L   CO2 23  19 - 32 mEq/L   Glucose, Bld 171 (*) 70 - 99 mg/dL   BUN 9  6 - 23 mg/dL   Creatinine, Ser 0.87  0.50 - 1.35 mg/dL   Calcium 8.7  8.4 - 10.5 mg/dL   Total Protein 7.4  6.0 - 8.3 g/dL   Albumin 3.4 (*) 3.5 - 5.2 g/dL   AST 15  0 - 37 U/L   ALT 11  0 - 53 U/L   Alkaline Phosphatase 51  39 - 117 U/L   Total Bilirubin 0.4  0.3 - 1.2 mg/dL   GFR calc non Af Amer 90 (*) >90 mL/min   GFR calc Af Amer >90  >90 mL/min   Comment: (NOTE)     The eGFR has been calculated using the CKD EPI equation.     This calculation has not been validated in all clinical situations.     eGFR's persistently <90 mL/min signify possible Chronic Kidney     Disease.  LACTIC ACID, PLASMA     Status: None   Collection Time    01/24/14  4:08 PM      Result Value Ref Range   Lactic Acid, Venous 1.2  0.5 - 2.2 mmol/L  LITHIUM LEVEL     Status: Abnormal   Collection Time    01/24/14  4:45 PM      Result Value Ref Range   Lithium Lvl 0.49 (*) 0.80 - 1.40 mEq/L  LITHIUM LEVEL     Status: Abnormal   Collection Time    01/24/14 11:38 PM      Result Value Ref Range   Lithium Lvl 0.34 (*) 0.80 - 1.40 mEq/L    Dg Chest 2 View  01/23/2014   CLINICAL DATA:  Fever, evaluate for pneumonia.  EXAM: CHEST  2 VIEW  COMPARISON:  January 23, 2014 2:42 p.m.  FINDINGS: The heart size and mediastinal contours are stable. There is no focal infiltrate, pulmonary edema, or pleural effusion. The visualized  skeletal structures are stable.  IMPRESSION: No active cardiopulmonary disease.   Electronically Signed   By: Abelardo Diesel M.D.   On: 01/23/2014 19:48   Ct Head Wo Contrast  01/23/2014   CLINICAL DATA:  Altered mental status  EXAM: CT HEAD WITHOUT CONTRAST  TECHNIQUE: Contiguous axial images were obtained from the base of the skull through the vertex without intravenous contrast. Study was obtained within 24 hr patient's arrival the emergency department.  COMPARISON:  Brain CT January 15, 2011 and brain MRI and Apr 17, 2011  FINDINGS: The ventricles are normal in size and configuration. Left lateral ventricle is slightly larger than the right lateral ventricle, an anatomic variant. There is no appreciable mass, hemorrhage, extra-axial fluid collection, or midline shift. Minimal periventricular small vessel disease is stable. There is no new gray-white compartment lesion. No demonstrable acute infarct.  Bony calvarium appears intact.  The mastoid air cells are clear. There is bilateral ethmoid sinus disease.  IMPRESSION: Bilateral ethmoid sinus disease. Minimal periventricular small vessel disease. No intracranial mass, hemorrhage, or acute appearing infarct.   Electronically Signed   By: Lowella Grip M.D.   On: 01/23/2014 15:48   Mr Brain Wo Contrast  01/23/2014   CLINICAL DATA:  Fever, confusion, slurred speech  EXAM: MRI HEAD WITHOUT CONTRAST  TECHNIQUE: Multiplanar, multiecho pulse sequences of the brain and surrounding structures were obtained without intravenous contrast.  COMPARISON:  Prior CT from earlier the same day  FINDINGS: Mild cerebral atrophy Mild scattered T2/FLAIR hyperintensity within the periventricular white matter is present, likely related to chronic microvascular ischemic changes. Focal parenchymal signal abnormality is identified. No mass lesion, midline shift, or extra-axial fluid collection. Ventricles are normal in size without evidence of hydrocephalus.  No diffusion-weighted  signal abnormality is identified to suggest acute intracranial infarct. Gray-white matter differentiation is maintained. Normal flow voids are seen within the intracranial vasculature. No intracranial hemorrhage identified.  The cervicomedullary junction is normal. Pituitary gland is within normal limits. Pituitary stalk is midline. The globes and optic nerves demonstrate a normal appearance with normal signal intensity. The  The bone marrow signal intensity is normal. Calvarium is intact. Visualized upper cervical spine is within normal limits.  Scalp soft tissues are unremarkable.  Paranasal sinuses are clear.  No mastoid effusion.  IMPRESSION: 1. No acute intracranial infarct or other abnormality identified. 2. Mild atrophy and chronic microvascular ischemic changes.   Electronically Signed   By: Jeannine Boga M.D.   On: 01/23/2014 20:59   Dg Chest Port 1 View  01/23/2014   CLINICAL DATA:  Altered mental status  EXAM: PORTABLE CHEST - 1 VIEW  COMPARISON:  11/20/2013  FINDINGS: Cardiomediastinal silhouette is is stable. No acute infiltrate or pleural effusion. No pulmonary edema. Mild basilar atelectasis.  IMPRESSION: No acute infiltrate or pulmonary edema.  Mild basilar atelectasis.   Electronically Signed   By: Lahoma Crocker M.D.   On: 01/23/2014 14:51    Positive for anxiety, bad mood, bipolar, depression, learning difficulty, mood swings and sleep disturbance Blood pressure 147/77, pulse 64, temperature 98.1 F (36.7 C), temperature source Oral, resp. rate 18, height '5\' 10"'  (1.778 m), weight 106.595 kg (235 lb), SpO2 97.00%.   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 01/25/2014, 2:42 PM

## 2014-01-25 NOTE — Clinical Social Work Psych Note (Addendum)
Psychiatry documentation reflects that pt does not endorse SI at this time; however, it was requested of Psych CSW to verify with Psychiatry whether the suicide sitter can be dc'd or whether to continue.  Psych CSW forwarded this request to Dr Elsie SaasJonnalagadda, psychiatrist.   Alan Brown, LCSWA [phone]  Clinical Social Work

## 2014-01-25 NOTE — Progress Notes (Signed)
Patient ID: Alan Brown  male  [id number]    DOB: 30-Apr-1951    DOA: 01/23/2014  PCP: Lupe CarneyMITCHELL,DEAN, MD  Assessment/Plan:  Fever  GPC bacteremia -Patient currently afebrile, 1/2 blood cultures positive for GPC, continue Vanco and Zosyn until sensitivities back - Leukocytosis resolved  Polysubstance Use.  -UDS positive amphetamines (possible false positive; Wellbutrin), barbiturates (possible false positive; naproxen), opiates, benzodiazepines  -Psychiatry consult appreciated  Chronic pain syndrome  - Per wife patient on multiple medications for chronic back pain secondary to multiple herniated discs, likely leading to polypharmacy. -At Admission Patient's Neurontin, Robaxin,held (still continues to have cognitive changes)  -Will also decrease his Norco dosing to q 8hrs.   Bipolar disorder with depression -Appreciate psychiatry for medication management, as sitter discontinued  AMS  -Most likely secondary to medication abuse  - Appreciate neurology and psychiatry evaluation and recommendations, EEG pending  Conjunctivitis left eye - Started on tobramycin eyedrops  DVT Prophylaxis:  Code Status:  Family Communication:  Disposition: TBD    Subjective: Sitter at the bedside, denies any specific complaints, left eye irritated  Objective: Weight change:   Intake/Output Summary (Last 24 hours) at 01/25/14 1630 Last data filed at 01/25/14 0900  Gross per 24 hour  Intake   1820 ml  Output   2000 ml  Net   -180 ml   Blood pressure 147/77, pulse 64, temperature 98.1 F (36.7 C), temperature source Oral, resp. rate 18, height 5\' 10"  (1.778 m), weight 106.595 kg (235 lb), SpO2 97.00%.  Physical Exam: General: Alert and awake, oriented x3, not in any acute distress. HEENT: Left eye redness and pruritus CVS: S1-S2 clear, no murmur rubs or gallops Chest: clear to auscultation bilaterally, no wheezing, rales or rhonchi Abdomen: soft nontender, nondistended,  normal bowel sounds  Extremities: no cyanosis, clubbing or edema noted bilaterally Neuro: Cranial nerves II-XII intact, no focal neurological deficits  Lab Results: Basic Metabolic Panel:  Recent Labs Lab 01/23/14 1427 01/24/14 1404  NA 137 143  K 4.1 4.0  CL 99 107  CO2 26 23  GLUCOSE 132* 171*  BUN 10 9  CREATININE 0.97 0.87  CALCIUM 9.2 8.7   Liver Function Tests:  Recent Labs Lab 01/23/14 1427 01/24/14 1404  AST 13 15  ALT 10 11  ALKPHOS 56 51  BILITOT 0.4 0.4  PROT 7.4 7.4  ALBUMIN 3.5 3.4*   No results found for this basename: LIPASE, AMYLASE,  in the last 168 hours No results found for this basename: AMMONIA,  in the last 168 hours CBC:  Recent Labs Lab 01/23/14 1427 01/24/14 1404  WBC 13.0* 8.2  NEUTROABS 9.6* 5.7  HGB 13.3 12.8*  HCT 39.5 38.7*  MCV 93.4 94.4  PLT 192 180   Cardiac Enzymes:  Recent Labs Lab 01/23/14 1428  TROPONINI <0.30   BNP: No components found with this basename: POCBNP,  CBG: No results found for this basename: GLUCAP,  in the last 168 hours   Micro Results: Recent Results (from the past 240 hour(s))  CULTURE, BLOOD (ROUTINE X 2)     Status: None   Collection Time    01/23/14  2:48 PM      Result Value Ref Range Status   Specimen Description BLOOD ARM RIGHT   Final   Special Requests BOTTLES DRAWN AEROBIC AND ANAEROBIC 5CC   Final   Culture  Setup Time     Final   Value: 01/23/2014 20:49     Performed at [facility]  Culture     Final   Value: GRAM POSITIVE COCCI IN CLUSTERS     10 Note: Gram Stain Report Called to,Read Back By and Verified With: CHRISSY DAVIS RN 2 15 620P EDMOJ     Performed at [facility]   Report Status PENDING   Incomplete  URINE CULTURE     Status: None   Collection Time    01/23/14  3:05 PM      Result Value Ref Range Status   Specimen Description URINE, CLEAN CATCH   Final   Special Requests NONE   Final   Culture  Setup Time     Final   Value: 01/23/2014 20:54      Performed at [facility] Count     Final   Value: NO GROWTH     Performed at [facility]   Culture     Final   Value: NO GROWTH     Performed at [facility]   Report Status 01/24/2014 FINAL   Final  CULTURE, BLOOD (ROUTINE X 2)     Status: None   Collection Time    01/23/14  3:20 PM      Result Value Ref Range Status   Specimen Description BLOOD HAND RIGHT   Final   Special Requests BOTTLES DRAWN AEROBIC ONLY 10CC   Final   Culture  Setup Time     Final   Value: 01/23/2014 20:50     Performed at [facility]   Culture     Final   Value:        BLOOD CULTURE RECEIVED NO GROWTH TO DATE CULTURE WILL BE HELD FOR 5 DAYS BEFORE ISSUING A FINAL NEGATIVE REPORT     Performed at [facility]   Report Status PENDING   Incomplete    Studies/Results: Dg Chest 2 View  01/23/2014   CLINICAL DATA:  Fever, evaluate for pneumonia.  EXAM: CHEST  2 VIEW  COMPARISON:  January 23, 2014 2:42 p.m.  FINDINGS: The heart size and mediastinal contours are stable. There is no focal infiltrate, pulmonary edema, or pleural effusion. The visualized skeletal structures are stable.  IMPRESSION: No active cardiopulmonary disease.   Electronically Signed   By: Sherian Rein M.D.   On: 01/23/2014 19:48   Ct Head Wo Contrast  01/23/2014   CLINICAL DATA:  Altered mental status  EXAM: CT HEAD WITHOUT CONTRAST  TECHNIQUE: Contiguous axial images were obtained from the base of the skull through the vertex without intravenous contrast. Study was obtained within 24 hr patient's arrival the emergency department.  COMPARISON:  Brain CT January 15, 2011 and brain MRI and Apr 17, 2011  FINDINGS: The ventricles are normal in size and configuration. Left lateral ventricle is slightly larger than the right lateral ventricle, an anatomic variant. There is no appreciable mass, hemorrhage, extra-axial fluid collection, or midline shift. Minimal periventricular small vessel disease is  stable. There is no new gray-white compartment lesion. No demonstrable acute infarct.  Bony calvarium appears intact. The mastoid air cells are clear. There is bilateral ethmoid sinus disease.  IMPRESSION: Bilateral ethmoid sinus disease. Minimal periventricular small vessel disease. No intracranial mass, hemorrhage, or acute appearing infarct.   Electronically Signed   By: Bretta Bang M.D.   On: 01/23/2014 15:48   Mr Brain Wo Contrast  01/23/2014   CLINICAL DATA:  Fever, confusion, slurred speech  EXAM: MRI HEAD WITHOUT CONTRAST  TECHNIQUE: Multiplanar, multiecho pulse  sequences of the brain and surrounding structures were obtained without intravenous contrast.  COMPARISON:  Prior CT from earlier the same day  FINDINGS: Mild cerebral atrophy Mild scattered T2/FLAIR hyperintensity within the periventricular white matter is present, likely related to chronic microvascular ischemic changes. Focal parenchymal signal abnormality is identified. No mass lesion, midline shift, or extra-axial fluid collection. Ventricles are normal in size without evidence of hydrocephalus.  No diffusion-weighted signal abnormality is identified to suggest acute intracranial infarct. Gray-white matter differentiation is maintained. Normal flow voids are seen within the intracranial vasculature. No intracranial hemorrhage identified.  The cervicomedullary junction is normal. Pituitary gland is within normal limits. Pituitary stalk is midline. The globes and optic nerves demonstrate a normal appearance with normal signal intensity. The  The bone marrow signal intensity is normal. Calvarium is intact. Visualized upper cervical spine is within normal limits.  Scalp soft tissues are unremarkable.  Paranasal sinuses are clear.  No mastoid effusion.  IMPRESSION: 1. No acute intracranial infarct or other abnormality identified. 2. Mild atrophy and chronic microvascular ischemic changes.   Electronically Signed   By: Rise Mu  M.D.   On: 01/23/2014 20:59   Dg Chest Port 1 View  01/23/2014   CLINICAL DATA:  Altered mental status  EXAM: PORTABLE CHEST - 1 VIEW  COMPARISON:  11/20/2013  FINDINGS: Cardiomediastinal silhouette is is stable. No acute infiltrate or pleural effusion. No pulmonary edema. Mild basilar atelectasis.  IMPRESSION: No acute infiltrate or pulmonary edema.  Mild basilar atelectasis.   Electronically Signed   By: Natasha Mead M.D.   On: 01/23/2014 14:51    Medications: Scheduled Meds: . aspirin EC  81 mg Oral Daily  . buPROPion  150 mg Oral Daily  . enoxaparin (LOVENOX) injection  40 mg Subcutaneous Q24H  . famotidine  20 mg Oral Daily  . lithium carbonate  900 mg Oral QHS  . piperacillin-tazobactam (ZOSYN)  IV  3.375 g Intravenous 3 times per day  . primidone  100 mg Oral QHS  . risperiDONE  2 mg Oral Daily  . sodium chloride  3 mL Intravenous Q12H  . tobramycin  1 drop Left Eye 6 times per day  . vancomycin  1,000 mg Intravenous Q12H      LOS: 2 days   RAI,RIPUDEEP M.D. [facility] 01/25/2014, 4:30 PM Pager: [phone]  If 7PM-7AM, please contact night-coverage [email or web address] Password [email or web address]

## 2014-01-26 ENCOUNTER — Encounter (HOSPITAL_COMMUNITY): Payer: Self-pay | Admitting: General Practice

## 2014-01-26 LAB — CULTURE, BLOOD (ROUTINE X 2)

## 2014-01-26 MED ORDER — PROMETHAZINE HCL 25 MG/ML IJ SOLN
12.5000 mg | Freq: Four times a day (QID) | INTRAMUSCULAR | Status: DC | PRN
  Filled 2014-01-26: qty 1

## 2014-01-26 MED ORDER — ONDANSETRON HCL 4 MG PO TABS
4.0000 mg | ORAL_TABLET | ORAL | Status: DC | PRN
  Administered 2014-01-26: 4 mg via ORAL
  Filled 2014-01-26: qty 1

## 2014-01-26 MED ORDER — ONDANSETRON HCL 4 MG/2ML IJ SOLN: 4.0000 mg | INTRAMUSCULAR | Status: DC | PRN

## 2014-01-26 NOTE — Progress Notes (Signed)
Patient ID: Alan Brown  male  ZOX:[id number]    DOB: December 18, 1950    DOA: 01/23/2014  PCP: Lupe Carney, MD  Assessment/Plan:  Fever  GPC bacteremia -Patient currently afebrile, 1/2 blood cultures positive for GPC, - Sensitivities back, coagulase negative staph, discontinue vancomycin and Zosyn.   Polysubstance Use.  -UDS positive amphetamines (possible false positive; Wellbutrin), barbiturates (possible false positive; naproxen), opiates, benzodiazepines  -Psychiatry consult appreciated  Chronic pain syndrome  - Per wife patient on multiple medications for chronic back pain secondary to multiple herniated discs, likely leading to polypharmacy. -At Admission Patient's Neurontin, Robaxin,held (still continues to have cognitive changes)  -Will also decrease his Norco dosing to q 8hrs. -Patient has not been complaining of any worsening pain continue to hold Neurontin and Robaxin    Bipolar disorder with depression -Appreciate psychiatry for medication management, as sitter discontinued  AMS  -Most likely secondary to medication abuse  - Appreciate neurology and psychiatry evaluation and recommendations, EEG  nonspecific but no focal seizures  Conjunctivitis left eye - Started on tobramycin eyedrops  DVT Prophylaxis:  Code Status:  Family Communication:Discussed in detail with patient's wife and updated on the medication changes. Patient's wife reports that he was confused yesterday evening but has significant improved from the time of admission. She is very appreciative of all the medication changes.    Disposition:  in a.m.    Subjective: Patient's wife at the bedside, patient sleepy  Objective: Weight change:  No intake or output data in the 24 hours ending 01/26/14 1446 Blood pressure 146/80, pulse 63, temperature 98 F (36.7 C), temperature source Oral, resp. rate 16, height 5\' 10"  (1.778 m), weight 106.211 kg (234 lb 2.4 oz), SpO2 97.00%.  Physical  Exam: General:  very sleepy and snoring  CVS: S1-S2 clear, no murmur rubs or gallops Chest: clear to auscultation bilaterally, no wheezing, rales or rhonchi Abdomen: soft nontender, nondistended, normal bowel sounds  Extremities: no cyanosis, clubbing or edema noted bilaterally   Lab Results: Basic Metabolic Panel:  Recent Labs Lab 01/23/14 1427 01/24/14 1404  NA 137 143  K 4.1 4.0  CL 99 107  CO2 26 23  GLUCOSE 132* 171*  BUN 10 9  CREATININE 0.97 0.87  CALCIUM 9.2 8.7   Liver Function Tests:  Recent Labs Lab 01/23/14 1427 01/24/14 1404  AST 13 15  ALT 10 11  ALKPHOS 56 51  BILITOT 0.4 0.4  PROT 7.4 7.4  ALBUMIN 3.5 3.4*   No results found for this basename: LIPASE, AMYLASE,  in the last 168 hours No results found for this basename: AMMONIA,  in the last 168 hours CBC:  Recent Labs Lab 01/23/14 1427 01/24/14 1404  WBC 13.0* 8.2  NEUTROABS 9.6* 5.7  HGB 13.3 12.8*  HCT 39.5 38.7*  MCV 93.4 94.4  PLT 192 180   Cardiac Enzymes:  Recent Labs Lab 01/23/14 1428  TROPONINI <0.30   BNP: No components found with this basename: POCBNP,  CBG: No results found for this basename: GLUCAP,  in the last 168 hours   Micro Results: Recent Results (from the past 240 hour(s))  CULTURE, BLOOD (ROUTINE X 2)     Status: None   Collection Time    01/23/14  2:48 PM      Result Value Ref Range Status   Specimen Description BLOOD ARM RIGHT   Final   Special Requests BOTTLES DRAWN AEROBIC AND ANAEROBIC 5CC   Final   Culture  Setup Time  Final   Value: 01/23/2014 20:49     Performed at [facility]   Culture     Final   Value: STAPHYLOCOCCUS SPECIES (COAGULASE NEGATIVE)     Note: THE SIGNIFICANCE OF ISOLATING THIS ORGANISM FROM A SINGLE SET OF BLOOD CULTURES WHEN MULTIPLE SETS ARE DRAWN IS UNCERTAIN. PLEASE NOTIFY THE MICROBIOLOGY DEPARTMENT WITHIN ONE WEEK IF SPECIATION AND SENSITIVITIES ARE REQUIRED.     10 Note: Gram Stain Report Called to,Read Back By  and Verified With: CHRISSY DAVIS RN 2 15 620P EDMOJ     Performed at [facility]   Report Status 01/26/2014 FINAL   Final  URINE CULTURE     Status: None   Collection Time    01/23/14  3:05 PM      Result Value Ref Range Status   Specimen Description URINE, CLEAN CATCH   Final   Special Requests NONE   Final   Culture  Setup Time     Final   Value: 01/23/2014 20:54     [facility]formed [facility]   Colony Count     Final   Value: NO GROWTH     Performed at [facility]   Culture     Final   Value: NO GROWTH     Performed at [facility]   Report Status 01/24/2014 FINAL   Final  CULTURE, BLOOD (ROUTINE X 2)     Status: None   Collection Time    01/23/14  3:20 PM      Result Value Ref Range Status   Specimen Description BLOOD HAND RIGHT   Final   Special Requests BOTTLES DRAWN AEROBIC ONLY 10CC   Final   Culture  Setup Time     Final   Value: 01/23/2014 20:50     Performed at [facility]   Culture     Final   Value:        BLOOD CULTURE RECEIVED NO GROWTH TO DATE CULTURE WILL BE HELD FOR 5 DAYS BEFORE ISSUING A FINAL NEGATIVE REPORT     Performed at [facility]   Report Status PENDING   Incomplete    Studies/Results: Dg Chest 2 View  01/23/2014   CLINICAL DATA:  Fever, evaluate for pneumonia.  EXAM: CHEST  2 VIEW  COMPARISON:  January 23, 2014 2:42 p.m.  FINDINGS: The heart size and mediastinal contours are stable. There is no focal infiltrate, pulmonary edema, or pleural effusion. The visualized skeletal structures are stable.  IMPRESSION: No active cardiopulmonary disease.   Electronically Signed   By: Sherian ReinWei-Chen  Lin M.D.   On: 01/23/2014 19:48   Ct Head Wo Contrast  01/23/2014   CLINICAL DATA:  Altered mental status  EXAM: CT HEAD WITHOUT CONTRAST  TECHNIQUE: Contiguous axial images were obtained from the base of the skull through the vertex without intravenous contrast. Study was obtained within 24 hr patient's arrival the  emergency department.  COMPARISON:  Brain CT January 15, 2011 and brain MRI and Apr 17, 2011  FINDINGS: The ventricles are normal in size and configuration. Left lateral ventricle is slightly larger than the right lateral ventricle, an anatomic variant. There is no appreciable mass, hemorrhage, extra-axial fluid collection, or midline shift. Minimal periventricular small vessel disease is stable. There is no new gray-white compartment lesion. No demonstrable acute infarct.  Bony calvarium appears intact. The mastoid air cells are clear. There is bilateral ethmoid sinus disease.  IMPRESSION: Bilateral ethmoid sinus disease. Minimal  periventricular small vessel disease. No intracranial mass, hemorrhage, or acute appearing infarct.   Electronically Signed   By: Bretta Bang M.D.   On: 01/23/2014 15:48   Mr Brain Wo Contrast  01/23/2014   CLINICAL DATA:  Fever, confusion, slurred speech  EXAM: MRI HEAD WITHOUT CONTRAST  TECHNIQUE: Multiplanar, multiecho pulse sequences of the brain and surrounding structures were obtained without intravenous contrast.  COMPARISON:  Prior CT from earlier the same day  FINDINGS: Mild cerebral atrophy Mild scattered T2/FLAIR hyperintensity within the periventricular white matter is present, likely related to chronic microvascular ischemic changes. Focal parenchymal signal abnormality is identified. No mass lesion, midline shift, or extra-axial fluid collection. Ventricles are normal in size without evidence of hydrocephalus.  No diffusion-weighted signal abnormality is identified to suggest acute intracranial infarct. Gray-white matter differentiation is maintained. Normal flow voids are seen within the intracranial vasculature. No intracranial hemorrhage identified.  The cervicomedullary junction is normal. Pituitary gland is within normal limits. Pituitary stalk is midline. The globes and optic nerves demonstrate a normal appearance with normal signal intensity. The  The bone  marrow signal intensity is normal. Calvarium is intact. Visualized upper cervical spine is within normal limits.  Scalp soft tissues are unremarkable.  Paranasal sinuses are clear.  No mastoid effusion.  IMPRESSION: 1. No acute intracranial infarct or other abnormality identified. 2. Mild atrophy and chronic microvascular ischemic changes.   Electronically Signed   By: Rise Mu M.D.   On: 01/23/2014 20:59   Dg Chest Port 1 View  01/23/2014   CLINICAL DATA:  Altered mental status  EXAM: PORTABLE CHEST - 1 VIEW  COMPARISON:  11/20/2013  FINDINGS: Cardiomediastinal silhouette is is stable. No acute infiltrate or pleural effusion. No pulmonary edema. Mild basilar atelectasis.  IMPRESSION: No acute infiltrate or pulmonary edema.  Mild basilar atelectasis.   Electronically Signed   By: Natasha Mead M.D.   On: 01/23/2014 14:51    Medications: Scheduled Meds: . aspirin EC  81 mg Oral Daily  . buPROPion  150 mg Oral Daily  . enoxaparin (LOVENOX) injection  40 mg Subcutaneous Q24H  . famotidine  20 mg Oral Daily  . lithium carbonate  900 mg Oral QHS  . primidone  100 mg Oral QHS  . risperiDONE  2 mg Oral Daily  . sodium chloride  3 mL Intravenous Q12H  . tobramycin  1 drop Left Eye 6 times per day      LOS: 3 days   RAI,RIPUDEEP M.D. [facility] 01/26/2014, 2:46 PM Pager: [phone]  If 7PM-7AM, please contact night-coverage [email or web address] Password [email or web address]

## 2014-01-26 NOTE — Procedures (Signed)
ELECTROENCEPHALOGRAM REPORT   Patient: Alan Brown       Room #: 1O103W24 EEG No. ID: [id number] Age: 63 y.o.        Sex: male Referring Physician: Rai Report Date:  01/25/2014        Interpreting Physician: Thana FarrEYNOLDS,Yu Peggs D  History: Alan MauJames Richard Carrara is an 63 y.o. male with altered mental status  Medications:  Scheduled: . aspirin EC  81 mg Oral Daily  . buPROPion  150 mg Oral Daily  . enoxaparin (LOVENOX) injection  40 mg Subcutaneous Q24H  . famotidine  20 mg Oral Daily  . lithium carbonate  900 mg Oral QHS  . piperacillin-tazobactam (ZOSYN)  IV  3.375 g Intravenous 3 times per day  . primidone  100 mg Oral QHS  . risperiDONE  2 mg Oral Daily  . sodium chloride  3 mL Intravenous Q12H  . tobramycin  1 drop Left Eye 6 times per day  . vancomycin  1,000 mg Intravenous Q12H    Conditions of Recording:  This is a 16 channel EEG carried out with the patient in the awake state.  Description:  The waking background activity consists of a low voltage, symmetrical, fairly well organized, 7 Hz theta activity, seen from the parieto-occipital and posterior temporal regions.  Low voltage fast activity, poorly organized, is seen anteriorly and is at times superimposed on more posterior regions.  A mixture of theta, mostly, but also alpha rhythms are seen from the central and temporal regions. The patient does not drowse or sleep. Hyperventilation produced a mild to moderate buildup but failed to elicit any abnormalities.  Intermittent photic stimulation was performed but failed to illicit any change in the tracing.   IMPRESSION: This is an abnormal EEG secondary to posterior background slowing.  This finding may be seen with a diffuse gray matter disturbance that is etiologically nonspecific, but may include a dementia, among other possibilities.    Comment:  An EEG with the patient sleep deprived to elicit drowse and light sleep may be desirable to further elicit a possible seizure  disorder.     Thana FarrLeslie Deshara Rossi, MD [facility] [phone] 01/26/2014, 8:11 AM

## 2014-01-26 NOTE — Consult Note (Signed)
Reason for Consult: Medication management of bipolar disorder Referring Physician: Dr. Merita Norton Alan Brown is an 63 y.o. male.  HPI: Patient is seen and chart reviewed. Case is discussed with his wife and Dr. Joseph Art. Patient and his wife stated that he has episodes of confusion, disorientation, slurred speech, increased sleepiness and unstable motor activities, lapses of time, forgetful and incoherent speech. Patient has been diagnosed with bipolar disorder, depression, and received medication from [facility] center. 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 He has no recent psych admission. He was received gabapentin and hydrocodone for pain in the last year. He is 100% disabled veteran. He has limited interaction with twin children (one boy and one girl) who five years old because of increased amount of sleep.   Interval History: Patient has been doing well without significant emotional or behavioral problems. Patient wife was at bedside stated that they were happy with the medication management and include psychomotor activities. Patient was able to walk without shuffling gait and able to swing his arms when walking. Patient has no confusion, thinking much clear and has  spontaneous and normal rate, rhythm and volume of  speech. Review of labs indicated a subtherapeutic level of lithium levels  over some time and may increase to a higher dose if patient becomes emotionally unstable during this hospitalization. Patient denies suicidal or homicidal ideation and has no evidence of psychosis.    Mental Status Examination: Patient is calm and cooperative.  He appeared sitting on his bed with his legs on the floor, wearing his walking shoe. He has normal psychomotor activity and has fair eye contact. Patient has sad  mood and his affect was  appropriate and bright . He has normal rate, rhythm, and volume of speech. His thought process is linear and goal  directed. Patient has denied suicidal, homicidal ideations, intentions or plans. Patient has no evidence of auditory or visual hallucinations, delusions, and paranoia. Patient has fair insight judgment and impulse control.  Past Medical History  Diagnosis Date  . Arthritis   . Depression   . GERD (gastroesophageal reflux disease)   . Bipolar 1 disorder   . Prolapse, disk     lower back  . Depression     Past Surgical History  Procedure Laterality Date  . Appendectomy  1961  . Tonsillectomy  1976  . Inguinal hernia repair  1998    left    Family History  Problem Relation Age of Onset  . Hypertension Mother   . Hypertension Father     Social History:  reports that he has quit smoking. He has never used smokeless tobacco. He reports that he does not drink alcohol or use illicit drugs.  Allergies:  Allergies  Allergen Reactions  . Naproxen Other (See Comments)    Increases lithium toxicity.     Medications: I have reviewed the patient's current medications.  Results for orders placed during the hospital encounter of 01/23/14 (from the past 48 hour(s))  LITHIUM LEVEL     Status: Abnormal   Collection Time    01/24/14  4:45 PM      Result Value Ref Range   Lithium Lvl 0.49 (*) 0.80 - 1.40 mEq/L  LITHIUM LEVEL     Status: Abnormal   Collection Time    01/24/14 11:38 PM      Result Value Ref Range   Lithium Lvl 0.34 (*) 0.80 - 1.40 mEq/L  No results found.  Positive for anxiety, bad mood, bipolar, depression, learning difficulty, mood swings and sleep disturbance Blood pressure 146/80, pulse 63, temperature 98 F (36.7 C), temperature source Oral, resp. rate 16, height 5\' 10"  (1.778 m), weight 106.211 kg (234 lb 2.4 oz), SpO2 97.00%.   Assessment/Plan: Bipolar disorder most recent episode unspecified   Recommendation: Spoke with the psychosocial services and then contacted the patient wife to clarify medication management needs Discontinue safety sitter as he  has no safety concerns or suicidal thoughts Continue Eskalith CR 900 mg Qhs for mood swings and may increase to 1200 mg if needed to control mania Continue Risperidone 2 mg PO Qhs/sedation; Continue Wellburin XL 150 mg PO QD Contiue Trazodone 50 mg PO Qhs/PRN; Monitor for adverse effects Appreciate psych consultation and will sign off, may call [phone] if needed further assistance.  Sheliah Fiorillo,JANARDHAHA R. 01/26/2014, 4:27 PM

## 2014-01-26 NOTE — Clinical Social Work Psych Note (Signed)
Psych CSW received notification from RN that pt wife is appreciative of RX adjustment and requests to speak directly with the Psychiatrist.  Psych CSW contacted psychiatrist to request the call.  Psychiatrist is aware of the request and is agreeable to the call.  Mrs. Dareen Pianonderson can be reached at [phone].  Vickii PennaGina Eriel Doyon, LCSWA [phone]  Clinical Social Work

## 2014-01-26 NOTE — Evaluation (Signed)
Physical Therapy Evaluation Patient Details Name: Alan Brown MRN: [id number] DOB: [birth year] Today's Date: 01/26/2014 Time: 1330-1350 PT Time Calculation (min): 20 min  PT Assessment / Plan / Recommendation History of Present Illness  Alan Brown is a 63 y.o. male  With mulitple medical problems, including tia, depression, bi polar, brought in for AMS, and slurred speech. On talking to the patient pt speech is normal and is oriented to place, person and time. He reports fever and myalgias. On arrival to ED, pt underwent cxr which was negative for pneumonia. UA was negative for infection. UDS ordered and results pending. He is referred to medical service for admission for ams evaluation  Clinical Impression  Pt lethargic during eval, needs min A for balance with gait and transfers.  Will benefit from skilled PT to address deficits and increase functional independence.  At this point PT recommends assistance with all out of bed activity due to decreased balance reactions and decreased LE strength.  Pt may benefit from RW for increased stability.    PT Assessment  Patient needs continued PT services    Follow Up Recommendations  Home health PT;Supervision for mobility/OOB    Does the patient have the potential to tolerate intense rehabilitation      Barriers to Discharge        Equipment Recommendations  Rolling walker with 5" wheels    Recommendations for Other Services     Frequency Min 3X/week    Precautions / Restrictions Precautions Precautions: Fall Restrictions Weight Bearing Restrictions: No   Pertinent Vitals/Pain No c/o pain upon PT entry, during session pt states "I hurt sometimes, just not now"      Mobility  Bed Mobility Overal bed mobility: Modified Independent Transfers Overall transfer level: Needs assistance Transfers: Sit to/from Stand Sit to Stand: Min guard General transfer comment: min guard needed due to increased sway, slight  LOB with delayed balance reactions Ambulation/Gait Ambulation/Gait assistance: Min assist Ambulation Distance (Feet): 150 Feet Assistive device: None Gait velocity interpretation: Below normal speed for age/gender General Gait Details: pt requires min A due to several instances of R knee buckling due to fatigue, pt unable to self correct LOB, delayed balance reactions Stairs: Yes Stairs assistance: Min assist Stair Management: One rail Right Number of Stairs: 4 General stair comments: Pt requires min A for lifting and lowering on stairs due to B knee weakness    Exercises     PT Diagnosis: Difficulty walking;Generalized weakness  PT Problem List: Decreased strength;Decreased activity tolerance;Decreased balance;Decreased mobility;Decreased knowledge of use of DME PT Treatment Interventions: DME instruction;Balance training;Gait training;Neuromuscular re-education;Stair training;Cognitive remediation;Functional mobility training;Patient/family education;Therapeutic activities;Wheelchair mobility training;Therapeutic exercise     PT Goals(Current goals can be found in the care plan section) Acute Rehab PT Goals Patient Stated Goal: none stated PT Goal Formulation: With patient Time For Goal Achievement: 02/09/14 Potential to Achieve Goals: Good  Visit Information  Last PT Received On: 01/26/14 Assistance Needed: +1 History of Present Illness: Alan Brown is a 63 y.o. male  With mulitple medical problems, including tia, depression, bi polar, brought in for AMS, and slurred speech. On talking to the patient pt speech is normal and is oriented to place, person and time. He reports fever and myalgias. On arrival to ED, pt underwent cxr which was negative for pneumonia. UA was negative for infection. UDS ordered and results pending. He is referred to medical service for admission for ams evaluation       Prior Functioning  Home Living Family/patient expects to be discharged  to:: Private residence Living Arrangements: Spouse/significant other;Children Available Help at Discharge: Family Type of Home: House Home Access: Stairs to enter Secretary/administratorntrance Stairs-Number of Steps: 2-3 Entrance Stairs-Rails: None Home Layout: One level Home Equipment: Cane - single point Additional Comments: pt states he occasionally used SPC Prior Function Level of Independence: Independent with assistive device(s) Comments: pt states he occasionally used [facility] Communication Communication: No difficulties    Cognition  Cognition Arousal/Alertness: Lethargic Behavior During Therapy: WFL for tasks assessed/performed Overall Cognitive Status: No family/caregiver present to determine baseline cognitive functioning    Extremity/Trunk Assessment Upper Extremity Assessment Upper Extremity Assessment: Generalized weakness Lower Extremity Assessment Lower Extremity Assessment: Generalized weakness Cervical / Trunk Assessment Cervical / Trunk Assessment: Normal   Balance    End of Session PT - End of Session Equipment Utilized During Treatment: Gait belt Activity Tolerance: Patient limited by lethargy Patient left: in bed;with call bell/phone within reach Nurse Communication: Mobility status  GP     Alan Brown 01/26/2014, 1:51 PM

## 2014-01-27 MED ORDER — HYDROCODONE-ACETAMINOPHEN 10-325 MG PO TABS: 1.0000 | ORAL_TABLET | Freq: Three times a day (TID) | ORAL | Status: DC | PRN

## 2014-01-27 MED ORDER — BUPROPION HCL ER (SR) 150 MG PO TB12: 150.0000 mg | ORAL_TABLET | Freq: Every day | ORAL | Status: DC

## 2014-01-27 MED ORDER — RISPERIDONE 4 MG PO TABS: 2.0000 mg | ORAL_TABLET | Freq: Every day | ORAL | Status: DC

## 2014-01-27 MED ORDER — TOBRAMYCIN 0.3 % OP SOLN: 1.0000 [drp] | OPHTHALMIC | Status: DC

## 2014-01-27 MED ORDER — PRIMIDONE 50 MG PO TABS: 100.0000 mg | ORAL_TABLET | Freq: Every day | ORAL | Status: DC

## 2014-01-27 NOTE — Discharge Summary (Signed)
Physician Discharge Summary  Patient ID: Alan Brown MRN: [id number] DOB/AGE: Aug 26, 1951 63 y.o.  Admit date: 01/23/2014 Discharge date: 01/27/2014  Primary Care Physician:  Lupe Carney, MD  Discharge Diagnoses:    . Acute encephalopathy resolved, multifactorial most likely due to polypharmacy  . Bipolar 1 disorder . Depression . Polysubstance (including opioids) dependence, daily use . Chronic pain syndrome Left eye conjunctivitis  Consults: Neurology, Dr. Thad Ranger                   Psychiatry, Dr. Sheryle Spray   Recommendations for Outpatient Follow-up:  Patient was recommended to follow up outpatient with pain management  Allergies:   Allergies  Allergen Reactions  . Naproxen Other (See Comments)    Increases lithium toxicity.      Discharge Medications:   Medication List    STOP taking these medications       clonazePAM 1 MG tablet  Commonly known as:  KLONOPIN     gabapentin 300 MG capsule  Commonly known as:  NEURONTIN     HYDROcodone Bitartrate ER 40 MG Cp12     naproxen 375 MG tablet  Commonly known as:  NAPROSYN      TAKE these medications       aspirin EC 81 MG tablet  Take 81 mg by mouth daily.     buPROPion 150 MG 12 hr tablet  Commonly known as:  WELLBUTRIN SR  Take 1 tablet (150 mg total) by mouth daily.     CALCIUM 600 + D PO  Take 1 tablet by mouth 2 (two) times daily.     fish oil-omega-3 fatty acids 1000 MG capsule  Take 3 g by mouth 2 (two) times daily.     HYDROcodone-acetaminophen 10-325 MG per tablet  Commonly known as:  NORCO  Take 1 tablet by mouth every 8 (eight) hours as needed for moderate pain (pain).     ketoconazole 2 % shampoo  Commonly known as:  NIZORAL  Apply 1 application topically 3 (three) times a week.     lithium carbonate 450 MG CR tablet  Commonly known as:  ESKALITH  Take 900 mg by mouth at bedtime.     primidone 50 MG tablet  Commonly known as:  MYSOLINE  Take 2 tablets (100 mg total)  by mouth at bedtime.     ranitidine 150 MG tablet  Commonly known as:  ZANTAC  Take 150 mg by mouth 2 (two) times daily.     risperidone 4 MG tablet  Commonly known as:  RISPERDAL  Take 0.5 tablets (2 mg total) by mouth daily.     tobramycin 0.3 % ophthalmic solution  Commonly known as:  TOBREX  Place 1 drop into the left eye every 4 (four) hours. X 5DAYS     traZODone 50 MG tablet  Commonly known as:  DESYREL  Take 50 mg by mouth at bedtime as needed for sleep.         Brief H and P: For complete details please refer to admission H and P, but in briefJames Douglas Brown is a 63 y.o. male With mulitple medical problems, including tia, depression, bipolar, brought in for AMS, and slurred speech. On talking to the patient pt speech is normal and is oriented to place, person and time. He reported fever and myalgias. On arrival to ED, pt underwent cxr which was negative for pneumonia. UA was negative for infection. UDS ordered and results pending. He wasreferred to medical service for admission  for ams evaluation   Hospital Course:  Patient is a 63 year old male with history of bipolar disorder, chronic pain syndrome, polysubstance abuse who was admitted with episodes of confusion or disorientation, slurred speech. His wife reported as the episodes where he would be hard to arouse or he would be wandering around in the house mumbling. He was on several psychiatric medications at the time of admission. Neurology was consulted and recommended he came level with the EEG and a psych evaluation. During the hospitalization patient had spiked a fever, the blood cultures had shown 1/ 2 GPC and patient was placed on IV antibiotics. The culture results eventually came back as coagulase-negative staph hence antibiotics were discontinued. Patient at the time of discharge was alert and oriented and closest to his baseline.  Fever resolved, Patient currently afebrile, 1/2 blood cultures positive for  GPC, sensitivities back as coagulase-negative staph. Vancomycin and Zosyn were discontinued.  Polysubstance Use.  -UDS positive amphetamines (possible false positive; Wellbutrin), barbiturates (possible false positive; naproxen), opiates, benzodiazepines. Psychiatry was consulted and recommended several medication changes which were also updated with his wife.   Chronic pain syndrome   Per wife patient on multiple medications for chronic back pain secondary to multiple herniated discs, likely leading to polypharmacy. At Admission Patient's Neurontin, Robaxin were held due to his cognitive changes. Norco changed to every 8 hours as needed, Patient has not been complaining of any worsening pain continue to hold Neurontin and Robaxin. The patient was recommended outpatient pain clinic management   Bipolar disorder with depression, significantly improved several medication changes were done by psychiatry with improvement in the patient's mental status.   AMS -Most likely secondary to medication abuse  - Appreciate neurology and psychiatry evaluation and recommendations, EEG nonspecific but no focal seizures   Conjunctivitis left eye  - Started on tobramycin eyedrops   Day of Discharge BP 154/82  Pulse 62  Temp(Src) 98.7 F (37.1 C) (Oral)  Resp 16  Ht 5\' 10"  (1.778 m)  Wt 103.874 kg (229 lb)  BMI 32.86 kg/m2  SpO2 97%  Physical Exam: General: Alert and awake oriented x3 not in any acute distress. CVS: S1-S2 clear no murmur rubs or gallops Chest: clear to auscultation bilaterally, no wheezing rales or rhonchi Abdomen: soft nontender, nondistended, normal bowel sounds Extremities: no cyanosis, clubbing or edema noted bilaterally    The results of significant diagnostics from this hospitalization (including imaging, microbiology, ancillary and laboratory) are listed below for reference.    LAB RESULTS: Basic Metabolic Panel:  Recent Labs Lab 01/23/14 1427 01/24/14 1404  NA 137  143  K 4.1 4.0  CL 99 107  CO2 26 23  GLUCOSE 132* 171*  BUN 10 9  CREATININE 0.97 0.87  CALCIUM 9.2 8.7   Liver Function Tests:  Recent Labs Lab 01/23/14 1427 01/24/14 1404  AST 13 15  ALT 10 11  ALKPHOS 56 51  BILITOT 0.4 0.4  PROT 7.4 7.4  ALBUMIN 3.5 3.4*   No results found for this basename: LIPASE, AMYLASE,  in the last 168 hours No results found for this basename: AMMONIA,  in the last 168 hours CBC:  Recent Labs Lab 01/23/14 1427 01/24/14 1404  WBC 13.0* 8.2  NEUTROABS 9.6* 5.7  HGB 13.3 12.8*  HCT 39.5 38.7*  MCV 93.4 94.4  PLT 192 180   Cardiac Enzymes:  Recent Labs Lab 01/23/14 1428  TROPONINI <0.30   BNP: No components found with this basename: POCBNP,  CBG: No results found  for this basename: GLUCAP,  in the last 168 hours  Significant Diagnostic Studies:  Dg Chest 2 View  01/23/2014   CLINICAL DATA:  Fever, evaluate for pneumonia.  EXAM: CHEST  2 VIEW  COMPARISON:  January 23, 2014 2:42 p.m.  FINDINGS: The heart size and mediastinal contours are stable. There is no focal infiltrate, pulmonary edema, or pleural effusion. The visualized skeletal structures are stable.  IMPRESSION: No active cardiopulmonary disease.   Electronically Signed   By: Sherian Rein M.D.   On: 01/23/2014 19:48   Ct Head Wo Contrast  01/23/2014   CLINICAL DATA:  Altered mental status  EXAM: CT HEAD WITHOUT CONTRAST  TECHNIQUE: Contiguous axial images were obtained from the base of the skull through the vertex without intravenous contrast. Study was obtained within 24 hr patient's arrival the emergency department.  COMPARISON:  Brain CT January 15, 2011 and brain MRI and Apr 17, 2011  FINDINGS: The ventricles are normal in size and configuration. Left lateral ventricle is slightly larger than the right lateral ventricle, an anatomic variant. There is no appreciable mass, hemorrhage, extra-axial fluid collection, or midline shift. Minimal periventricular small vessel disease is  stable. There is no new gray-white compartment lesion. No demonstrable acute infarct.  Bony calvarium appears intact. The mastoid air cells are clear. There is bilateral ethmoid sinus disease.  IMPRESSION: Bilateral ethmoid sinus disease. Minimal periventricular small vessel disease. No intracranial mass, hemorrhage, or acute appearing infarct.   Electronically Signed   By: Bretta Bang M.D.   On: 01/23/2014 15:48   Mr Brain Wo Contrast  01/23/2014   CLINICAL DATA:  Fever, confusion, slurred speech  EXAM: MRI HEAD WITHOUT CONTRAST  TECHNIQUE: Multiplanar, multiecho pulse sequences of the brain and surrounding structures were obtained without intravenous contrast.  COMPARISON:  Prior CT from earlier the same day  FINDINGS: Mild cerebral atrophy Mild scattered T2/FLAIR hyperintensity within the periventricular white matter is present, likely related to chronic microvascular ischemic changes. Focal parenchymal signal abnormality is identified. No mass lesion, midline shift, or extra-axial fluid collection. Ventricles are normal in size without evidence of hydrocephalus.  No diffusion-weighted signal abnormality is identified to suggest acute intracranial infarct. Gray-white matter differentiation is maintained. Normal flow voids are seen within the intracranial vasculature. No intracranial hemorrhage identified.  The cervicomedullary junction is normal. Pituitary gland is within normal limits. Pituitary stalk is midline. The globes and optic nerves demonstrate a normal appearance with normal signal intensity. The  The bone marrow signal intensity is normal. Calvarium is intact. Visualized upper cervical spine is within normal limits.  Scalp soft tissues are unremarkable.  Paranasal sinuses are clear.  No mastoid effusion.  IMPRESSION: 1. No acute intracranial infarct or other abnormality identified. 2. Mild atrophy and chronic microvascular ischemic changes.   Electronically Signed   By: Rise Mu  M.D.   On: 01/23/2014 20:59   Dg Chest Port 1 View  01/23/2014   CLINICAL DATA:  Altered mental status  EXAM: PORTABLE CHEST - 1 VIEW  COMPARISON:  11/20/2013  FINDINGS: Cardiomediastinal silhouette is is stable. No acute infiltrate or pleural effusion. No pulmonary edema. Mild basilar atelectasis.  IMPRESSION: No acute infiltrate or pulmonary edema.  Mild basilar atelectasis.   Electronically Signed   By: Natasha Mead M.D.   On: 01/23/2014 14:51       Disposition and Follow-up:     Discharge Orders   Future Orders Complete By Expires   Diet - low sodium heart healthy  As  directed    Discharge instructions  As directed    Comments:     Please note all the medication changes prior to restarting your meds.   Increase activity slowly  As directed        DISPOSITION: Home DIET: Heart healthy diet   DISCHARGE FOLLOW-UP Follow-up Information   Follow up with Lupe CarneyMITCHELL,DEAN, MD. Schedule an appointment as soon as possible for a visit in 10 days. (for hospital follow-up)    Specialty:  Family Medicine   Contact information:   [address] [place] [address] [phone]       Time spent on Discharge: 45 mins  Signed:   Hilliard Borges M.D. [facility] 01/27/2014, 2:57 PM Pager: [phone]

## 2014-01-27 NOTE — Care Management Note (Signed)
    Page 1 of 2   01/27/2014     2:01:59 PM   CARE MANAGEMENT NOTE 01/27/2014  Patient:  Alan Brown,Alan Brown   Account Number:  [id number]  Date Initiated:  01/27/2014  Documentation initiated by:  Donn PieriniWEBSTER,Marvell Stavola  Subjective/Objective Assessment:   Pt admitted with AMS     Action/Plan:   PTA pt lived at home with spouse- PT eval   Anticipated DC Date:  01/27/2014   Anticipated DC Plan:  HOME W HOME HEALTH SERVICES      DC Planning Services  CM consult      [facility]  DURABLE MEDICAL EQUIPMENT   Choice offered to / List presented to:  C-1 Patient   DME arranged  [facility]      DME agency  OTHER - SEE NOTE     HH arranged  HH-2 PT  HH-3 OT      HH agency  [facility]   Status of service:  Completed, signed off Medicare Important Message given?   (If response is "NO", the following Medicare IM given date fields will be blank) Date Medicare IM given:   Date Additional Medicare IM given:    Discharge Disposition:  HOME W HOME HEALTH SERVICES  Per UR Regulation:  Reviewed for med. necessity/level of care/duration of stay  If discussed at Long Length of Stay Meetings, dates discussed:    Comments:  01/27/14- 1230- Donn PieriniKristi Hayden Kihara RN, BSN [phone] Pt for d/c home today- orders for HH-PT/OT- and DME-RW- in to speak with pt and wife at bedside- [facility] [place] [place] given to pt- per [facility]- referral called to [facility]upita [facility]Donna [facility]- pt states that he has a cane at home- unsure he wants a RW- would like to f/u with HH-PT/OT first- order printed for RW and given to pt- in case he decides he wants it he can go to a medical supply store and get one with MD order. Pt and wife interested in finding pt a pain clinic-Pt to f/u with outside MD regarding pain clinics. No further CM needs.

## 2014-01-27 NOTE — Progress Notes (Signed)
DC orders received.  Patient stable with no S/S of distress.  Medication and discharge information reviewed with patient and patient's wife.  Patient given information regarding TIA, due to presenting symptoms at start of hospitalziation.  Patient DC home with wife. [place], Mitzi HansenJessica Marie

## 2014-01-27 NOTE — Progress Notes (Signed)
PT Cancellation Note  Patient Details Name: Alan Brown Richard Mullett MRN: [id number] DOB: 1951-07-30   Cancelled Treatment:    Reason Eval/Treat Not Completed: Patient declined, no reason specified (ready for d/c, caregiver confident she can assist him).  CM setting up home therapy and prescription for RW if needed.   Thanks,   Rollene Rotundaebecca B. Malikah Principato, PT, DPT [phone]   01/27/2014, 1:09 PM

## 2014-01-27 NOTE — Discharge Instructions (Addendum)
Agnosia Agnosia is a loss of ability to recognize objects, persons, sounds, shapes, or smells. The specific sense is not defective nor is there any significant memory loss. It is usually associated with a brain injury or neurological illness. There are 3 forms of Agnosia:  Visual Agnosia. A person cannot recognize people or objects even though there is no abnormality of the eyes or the visual system.  Auditory Agnosia. A person cannot hear things such as words, environmental sounds or music, but can hear other sounds.  Somatosensory Agnosia. A person cannot tell what an object is by touching it. People with agnosia do not suffer from general memory loss. Patients may have visual agnosia with or without the other forms of agnosia. There are several forms of visual agnosia:  Prosopagnosia. An inability to recognize a person's face. This happens even if the person with visual agnosia knows the other person.  Agnostic Alexia. An inability to recognize written letters or words.  Color Agnosia or Cerebral Achromatopsia. An inability to tell the difference between colors or name colors. This differs from color blindness, which is a genetically determined disorder.  Object Agnosia. An inability to recognize and name an object.  Simultanagnosia. An inability to recognize an entire object, even though the individual may be able to recognize and name parts of the object. CAUSES   Diseases or disorders of specific regions of the brain. It is usually the left parts of the brain.  Bleeding (hemorrhage) or lack of blood supply to those regions of the brain (stroke or cerebral vascular accident).  Brain tumors.  Cancer that spreads to the brain from another part of the body (metastatic cancer).  Trauma.  Abnormalities (lesions) that occur on both sides of the brain in the occipital and temporal lobes(prosopagnosia).  Diseases of the outside lining of the nerves that make up the brain and body's  peripheral nerves (demyelinating diseases).  Multiple sclerosis.  Certain rare brain diseases (Nielsen-Jacobs Syndrome).  Carbon dioxide poisoning.  Lack of oxygen.  Memory loss (dementia).  Passed down from a parent (heredity).  Brain infection (meningitis, encephalitis). SYMPTOMS  There are 2 stages of visual agnosia:  Apperceptive Agnosia.This is a more severe type of visual agnosia. The affected person has an inability to group or manipulate an object that can only be seen. They may not be able to distinguish shape or even say if they have seen a similar object before. They may have a hard time copying simple drawings.  Associative Agnosia.This is a less severe form of visual agnosia. The person has a vague sense of the object that they cannot recognize. People in this stage of agnosia can copy drawings but are unaware of what the drawing is. They also have a hard time giving words to what they see. DIAGNOSIS   Neurological exams and testing.  More tests may be requires to find the cause. TREATMENT   Certain exercises are done. These exercises help the person identify objects. Usually treatment starts with objects that are needed for independence. In order to identify necessary objects, the patient learns to keep objects in a specific place, or to make lists of where things should be so. That way, when they do see something, they already expect it to be what they are searching for.  They learn to use labels for objects of necessity.  By using both approaches, it may help the patient recognize objects and maximize independence. Document Released: 11/21/2002 Document Revised: 02/23/2012 Document Reviewed: 07/09/2008 ExitCare Patient Information  2014 [employer] [place].    HH-PT arranged with [facility]- [phone]- pt wanted to think about the RW- order given to pt to f/u with if he decides he wants it.

## 2014-01-29 LAB — CULTURE, BLOOD (ROUTINE X 2): Culture: NO GROWTH

## 2015-03-06 ENCOUNTER — Ambulatory Visit
Admission: RE | Admit: 2015-03-06 | Discharge: 2015-03-06 | Disposition: A | Discharge status: Home / Self Care | Payer: Medicare Other | Source: Ambulatory Visit | Attending: Family Medicine | Admitting: Family Medicine

## 2015-03-06 ENCOUNTER — Other Ambulatory Visit: Payer: Self-pay | Admitting: Family Medicine

## 2015-03-06 DIAGNOSIS — R079 Chest pain, unspecified: Secondary | ICD-10-CM

## 2015-04-20 ENCOUNTER — Encounter: Payer: Self-pay | Admitting: Gastroenterology

## 2015-06-11 ENCOUNTER — Other Ambulatory Visit: Payer: Self-pay

## 2017-06-02 ENCOUNTER — Encounter: Payer: Self-pay | Admitting: Gastroenterology

## 2017-11-09 ENCOUNTER — Encounter: Payer: Self-pay | Admitting: Neurology

## 2017-11-14 ENCOUNTER — Other Ambulatory Visit: Payer: Self-pay

## 2017-11-14 ENCOUNTER — Emergency Department (HOSPITAL_COMMUNITY)
Admission: EM | Admit: 2017-11-14 | Discharge: 2017-11-14 | Disposition: A | Discharge status: Home / Self Care | Payer: Medicare Other | Attending: Emergency Medicine | Admitting: Emergency Medicine

## 2017-11-14 ENCOUNTER — Encounter (HOSPITAL_COMMUNITY): Payer: Self-pay | Admitting: Nurse Practitioner

## 2017-11-14 DIAGNOSIS — M542 Cervicalgia: Secondary | ICD-10-CM | POA: Diagnosis present

## 2017-11-14 DIAGNOSIS — Z79899 Other long term (current) drug therapy: Secondary | ICD-10-CM | POA: Insufficient documentation

## 2017-11-14 DIAGNOSIS — Z87891 Personal history of nicotine dependence: Secondary | ICD-10-CM | POA: Insufficient documentation

## 2017-11-14 DIAGNOSIS — Z7982 Long term (current) use of aspirin: Secondary | ICD-10-CM | POA: Diagnosis not present

## 2017-11-14 DIAGNOSIS — M545 Low back pain: Secondary | ICD-10-CM | POA: Diagnosis not present

## 2017-11-14 DIAGNOSIS — G20 Parkinson's disease: Secondary | ICD-10-CM | POA: Insufficient documentation

## 2017-11-14 MED ORDER — PREDNISONE 20 MG PO TABS: 40.0000 mg | ORAL_TABLET | Freq: Every day | ORAL | 0 refills | Status: DC

## 2017-11-14 MED ORDER — PREDNISONE 20 MG PO TABS
60.0000 mg | ORAL_TABLET | ORAL | Status: AC
  Administered 2017-11-14: 60 mg via ORAL
  Filled 2017-11-14: qty 3

## 2017-11-14 NOTE — Discharge Instructions (Signed)
As discussed, your evaluation today has been largely reassuring.  But, it is important that you monitor your condition carefully, and do not hesitate to return to the ED if you develop new, or concerning changes in your condition. ? ?Otherwise, please follow-up with your physician for appropriate ongoing care. ? ?

## 2017-11-14 NOTE — ED Provider Notes (Signed)
[facility]-EMERGENCY DEPT Provider Note   CSN: [id number] Arrival date & time: 11/14/17  1224     History   Chief Complaint Chief Complaint  Patient presents with  . Back Pain  . Hip Pain    HPI Alan Brown is a 66 y.o. male.  HPI Neck and back pain. Patient notes that he has had pain for some time, but [facility] here for evaluation. He acknowledges multiple medical issues including Parkinson's disease, bipolar disorder, anxiety. He also has a history of shingles approximately 2 years ago, with slow healing/scabbing on his left neck since the time. He denies any changes including weakness in his extremities, nausea, vomiting, numbness anywhere, headache, vision loss. He typically takes OxyContin for pain control. Yesterday, after seeing his physician, and recalling to her that he was having persistent pain he was referred to the emergency department for evaluation. Past Medical History:  Diagnosis Date  . Anxiety   . Arthritis    "all through my body" (01/26/2014)  . Bipolar 1 disorder (HCC)   . Chronic lower back pain   . Depression   . Depression   . GERD (gastroesophageal reflux disease)   . Pneumonia 1952; 11/2013  . Prolapse, disk    lower back    Patient Active Problem List   Diagnosis Date Noted  . Depression 01/24/2014  . Polysubstance (including opioids) dependence, daily use (HCC) 01/24/2014  . Chronic pain syndrome 01/24/2014  . Sepsis (HCC) 01/23/2014  . Altered mental status 01/23/2014  . Acute encephalopathy 11/19/2013  . SIRS (systemic inflammatory response syndrome) (HCC) 11/19/2013  . Community acquired pneumonia 11/19/2013  . GERD (gastroesophageal reflux disease) 11/19/2013  . Bipolar 1 disorder (HCC) 11/19/2013  . Lactic acidosis 11/19/2013  . Pneumonia 11/19/2013    Past Surgical History:  Procedure Laterality Date  .  APPENDECTOMY  1961  . INGUINAL HERNIA REPAIR Left 1998  . TONSILLECTOMY  1976       Home Medications    Prior to Admission medications   Medication Sig Start Date End Date Taking? Authorizing Provider  aspirin EC 81 MG tablet Take 81 mg by mouth daily.    [provider]  buPROPion (WELLBUTRIN SR) 150 MG 12 hr tablet Take 1 tablet (150 mg total) by mouth daily. 01/27/14   Rai, Delene Ruffiniipudeep K, MD  Calcium Carb-Cholecalciferol (CALCIUM 600 + D PO) Take 1 tablet by mouth 2 (two) times daily.    [provider]  fish oil-omega-3 fatty acids 1000 MG capsule Take 3 g by mouth 2 (two) times daily.     [provider]  HYDROcodone-acetaminophen (NORCO) 10-325 MG per tablet Take 1 tablet by mouth every 8 (eight) hours as needed for moderate pain (pain). 01/27/14   Rai, Delene Ruffiniipudeep K, MD  ketoconazole (NIZORAL) 2 % shampoo Apply 1 application topically 3 (three) times a week.    [provider]  lithium carbonate (ESKALITH) 450 MG CR tablet Take 900 mg by mouth at bedtime.    [provider]  predniSONE (DELTASONE) 20 MG tablet Take 2 tablets (40 mg total) by mouth daily with breakfast. For the next four days 11/14/17   Gerhard MunchLockwood, Nadira Single, MD  primidone (MYSOLINE) 50 MG tablet Take 2 tablets (100 mg total) by mouth at bedtime. 01/27/14   Rai, Delene Ruffiniipudeep K, MD  ranitidine (ZANTAC) 150 MG tablet Take 150 mg by mouth 2 (two) times daily.  [provider]  risperidone (RISPERDAL) 4 MG tablet Take 0.5 tablets (2 mg total) by mouth daily. 01/27/14   Rai, Ripudeep K, MD  tobramycin (TOBREX) 0.3 % ophthalmic solution Place 1 drop into the left eye every 4 (four) hours. X 5DAYS 01/27/14   Rai, Delene Ruffiniipudeep K, MD  traZODone (DESYREL) 50 MG tablet Take 50 mg by mouth at bedtime as needed for sleep.    [provider]    Family History Family History  Problem Relation Age of Onset  . Hypertension Mother   . Hypertension Father     Social History Social  History   Tobacco Use  . Smoking status: Former Smoker    Packs/day: 1.00    Years: 25.00    Pack years: 25.00    Types: Cigarettes  . Smokeless tobacco: Never Used  . Tobacco comment: 01/26/2014 "quit smoking 15-16 yr ago"  Substance Use Topics  . Alcohol use: Yes    Comment: 01/26/2014 "quit drinking 19 yr ago; I'm a recovering alcoholic"  . Drug use: No     Allergies   Naproxen   Review of Systems Review of Systems  Constitutional:       Per HPI, otherwise negative  HENT:       Per HPI, otherwise negative  Respiratory:       Per HPI, otherwise negative  Cardiovascular:       Per HPI, otherwise negative  Gastrointestinal: Negative for vomiting.  Endocrine:       Negative aside from HPI  Genitourinary:       Neg aside from HPI   Musculoskeletal:       Per HPI, otherwise negative  Skin: Negative.   Neurological: Positive for tremors. Negative for syncope.  Psychiatric/Behavioral: The patient is nervous/anxious.      Physical Exam Updated Vital Signs BP 127/78 (BP Location: Left Arm)   Pulse 62   Temp 98.8 F (37.1 C) (Oral)   Resp 18   Ht 5\' 10"  (1.778 m)   Wt 89.4 kg (197 lb)   SpO2 99%   BMI 28.27 kg/m   Physical Exam  Constitutional: He is oriented to person, place, and time. He appears well-developed. No distress.  HENT:  Head: Normocephalic and atraumatic.  Eyes: Conjunctivae and EOM are normal.  Neck:    Cardiovascular: Normal rate and regular rhythm.  Pulmonary/Chest: Effort normal. No stridor. No respiratory distress.  Abdominal: He exhibits no distension.  Musculoskeletal: He exhibits no edema.  Neurological: He is alert and oriented to person, place, and time. He displays tremor. He displays no atrophy. He exhibits normal muscle tone. He displays no seizure activity. Coordination normal.  Skin: Skin is warm and dry.  Psychiatric: He has a normal mood and affect.  Nursing note and vitals reviewed.    ED Treatments / Results    Procedures Procedures (including critical care time)  Medications Ordered in ED Medications  predniSONE (DELTASONE) tablet 60 mg (not administered)     Initial Impression / Assessment and Plan / ED Course  I have reviewed the triage vital signs and the nursing notes.  Pertinent labs & imaging results that were available during my care of the patient were reviewed by me and considered in my medical decision making (see chart for details).  The patient presents with concern of ongoing back and neck pain. Here he is awake, alert, hemodynamically stable with no endorsement of new neurologic loss of function, nor substantial change from his baseline pain. Some supposition  for postherpetic neuralgia, no evidence for new acute injury, or deficit. After a very lengthy conversation with the patient about his medication, medical history, following up with neurology and physical therapy, the patient was started on a short course of steroids.   Final Clinical Impressions(s) / ED Diagnoses   Final diagnoses:  Neck pain    ED Discharge Orders        Ordered    predniSONE (DELTASONE) 20 MG tablet  Daily with breakfast     11/14/17 1410       Gerhard Munch, MD 11/14/17 1414

## 2017-11-14 NOTE — ED Triage Notes (Signed)
Pt BIB ems with c/o Parkinson's pain x 2-3 days. Reports to ems that this is chronic and refuses to take his pain medication daily because he doesn't want to get addicted. Reports pain is in hip and lower back. Pain is worst today than usual. Patient has some anxiety also. [facility] VS: 124/86, 64, 18 resp, 98% RA, NSR. Patient is A&O and ambulatory with standby assistance. Per ems patient reports he generally drives and performs self care.

## 2017-11-14 NOTE — ED Notes (Signed)
Pt is hear for Hip/back/neck pain but also is here for his parkinson symptoms.

## 2017-11-23 ENCOUNTER — Ambulatory Visit: Payer: Medicare Other | Admitting: Neurology

## 2017-11-24 NOTE — Progress Notes (Signed)
Alan Brown was seen today in the movement disorders clinic for neurologic consultation at the request of Dolan AmenNorwood, Dorothy, MD.  The consultation is for the evaluation of PD.  I don't have records from his previous neurologist. Wife states that he was told by one doctor he had drug induced parkinsonism and another doctor told him it was PD.  It was never documented that way per wife.  Wife would like to know etiology for tremor It appears via med records that [facility]  He was seen as an inpatient in 2015 by neurohospitalists Dr. Roseanne RenoStewart and Dr. Thad Rangereynolds for TME, which was felt due to medication.     Specific Symptoms:  Tremor: Yes.   Tremor for 2 years at least, both hands, worse with eating, holding a drink.  Not present at rest.  R hand may be worse but is right hand dominant.  He thinks that he has tremor in the legs.  (pt on lithium for 18 years; patient on risperdal for as long as our EMR takes us back, which was 2012.  They are not sure how long he has been off of that but may be a year; pt on primidone for tx - 50 mg tid.  This was just increased for the tx of tremor) Family hx of similar:  No. Voice: wife thinks that it has become more raspy/gravely Sleep: trouble staying asleep  Vivid Dreams:  No.  Acting out dreams:  No. Wet Pillows: No. Postural symptoms:  Yes.    Falls?  Yes.  , last fall within the last month in the sunroom.  Went to get up and fell down again Bradykinesia symptoms: difficulty getting out of a chair (but patient denies that but wife thinks that it is okay) Loss of smell:  No. Loss of taste:  No. Urinary Incontinence:  No. (better with meds) Difficulty Swallowing:  May have to "try" a few times to swallow saliva before goes down Handwriting, micrographia: No. Trouble with ADL's:  No.  Trouble buttoning clothing: Yes.  , due to tremor (wears t-shirts) Depression:  No. but "I am really pissed  off" about tremor.  Asks about DBS surgery Memory changes:  Yes.   N/V:  No. (some about a month ago but better) Lightheaded:  Yes.  , occasional when first gets up   Syncope: No. Diplopia:  No. Dyskinesia:  No.  Neuroimaging of the brain has previously been performed.  It is not available for my review today.  ALLERGIES:   Allergies  Allergen Reactions  . Naproxen Other (See Comments)    Increases lithium toxicity.   . Gabapentin   . Morphine And Related     CURRENT MEDICATIONS:  Outpatient Encounter Medications as of 11/26/2017  Medication Sig  . aspirin EC 81 MG tablet Take 81 mg by mouth daily.  Marland Kitchen. buPROPion (WELLBUTRIN SR) 150 MG 12 hr tablet Take 1 tablet (150 mg total) by mouth daily.  . busPIRone (BUSPAR) 5 MG tablet Take 5 mg by mouth 2 (two) times daily.  . Calcium Carb-Cholecalciferol (CALCIUM 600 + D PO) Take 1 tablet by mouth 2 (two) times daily.  . clonazePAM (KLONOPIN) 1 MG tablet Take 0.5 mg by mouth daily.  . diclofenac sodium (VOLTAREN) 1 % GEL Apply topically 3 (three) times daily.  . DULoxetine (CYMBALTA) 20 MG capsule Take 40 mg by mouth at bedtime.  . lansoprazole (PREVACID) 15 MG capsule Take  30 mg by mouth 2 (two) times daily.  Marland Kitchen. lithium carbonate (ESKALITH) 450 MG CR tablet Take 900 mg by mouth at bedtime.  . Melatonin 3 MG TABS Take 6 mg by mouth at bedtime.  . methocarbamol (ROBAXIN) 500 MG tablet Take 1,000 mg by mouth 2 (two) times daily.  Marland Kitchen. oxyCODONE-acetaminophen (PERCOCET) 7.5-325 MG tablet Take 1 tablet by mouth 3 (three) times daily.  . primidone (MYSOLINE) 50 MG tablet Take 2 tablets (100 mg total) by mouth at bedtime. (Patient taking differently: Take 50 mg by mouth 3 (three) times daily. )  . propranolol ER (INDERAL LA) 60 MG 24 hr capsule Take 60 mg by mouth daily.  . ranitidine (ZANTAC) 150 MG tablet Take 150 mg by mouth 2 (two) times daily.   . sildenafil (VIAGRA) 100 MG tablet Take 100 mg by mouth daily.  . tamsulosin (FLOMAX) 0.4 MG CAPS  capsule Take 0.4 mg by mouth.  . traMADol (ULTRAM) 50 MG tablet Take 1 tablet by mouth 4 (four) times daily.  . traZODone (DESYREL) 50 MG tablet Take 50 mg by mouth at bedtime as needed for sleep.  . [DISCONTINUED] fish oil-omega-3 fatty acids 1000 MG capsule Take 3 g by mouth 2 (two) times daily.   . [DISCONTINUED] HYDROcodone-acetaminophen (NORCO) 10-325 MG per tablet Take 1 tablet by mouth every 8 (eight) hours as needed for moderate pain (pain).  . [DISCONTINUED] ketoconazole (NIZORAL) 2 % shampoo Apply 1 application topically 3 (three) times a week.  . [DISCONTINUED] predniSONE (DELTASONE) 20 MG tablet Take 2 tablets (40 mg total) by mouth daily with breakfast. For the next four days  . [DISCONTINUED] risperidone (RISPERDAL) 4 MG tablet Take 0.5 tablets (2 mg total) by mouth daily.  . [DISCONTINUED] tobramycin (TOBREX) 0.3 % ophthalmic solution Place 1 drop into the left eye every 4 (four) hours. X 5DAYS   No facility-administered encounter medications on file as of 11/26/2017.     PAST MEDICAL HISTORY:   Past Medical History:  Diagnosis Date  . Anxiety   . Arthritis    "all through my body" (01/26/2014)  . Bipolar 1 disorder (HCC)   . Chronic lower back pain   . Depression   . Depression   . GERD (gastroesophageal reflux disease)   . Pneumonia 1952; 11/2013  . Prolapse, disk    lower back    PAST SURGICAL HISTORY:   Past Surgical History:  Procedure Laterality Date  . APPENDECTOMY  1961  . INGUINAL HERNIA REPAIR Left 1998  . TONSILLECTOMY  1976    SOCIAL HISTORY:   Social History   Socioeconomic History  . Marital status: Married    Spouse name: Not on file  . Number of children: Not on file  . Years of education: Not on file  . Highest education level: Not on file  Social Needs  . Financial resource strain: Not on file  . Food insecurity - worry: Not on file  . Food insecurity - inability: Not on file  . Transportation needs - medical: Not on file  .  Transportation needs - non-medical: Not on file  Occupational History  . Not on file  Tobacco Use  . Smoking status: Former Smoker    Packs/day: 1.00    Years: 25.00    Pack years: 25.00    Types: Cigarettes  . Smokeless tobacco: Never Used  . Tobacco comment: 01/26/2014 "quit smoking 15-16 yr ago"  Substance and Sexual Activity  . Alcohol use: No    Frequency:  Never    Comment: 01/26/2014 "quit drinking 19 yr ago; I'm a recovering alcoholic"  . Drug use: No  . Sexual activity: Not Currently  Other Topics Concern  . Not on file  Social History Narrative  . Not on file    FAMILY HISTORY:   Family Status  Relation Name Status  . Mother  (Not Specified)  . Father  Deceased  . Sister  Deceased  . Sister x2 Alive  . Child x3 Alive    ROS:  A complete 10 system review of systems was obtained and was unremarkable apart from what is mentioned above.  PHYSICAL EXAMINATION:    VITALS:   Vitals:   11/26/17 0955  BP: 132/80  Pulse: (!) 55  SpO2: 95%  Weight: 202 lb (91.6 kg)  Height: 5\' 10"  (1.778 m)    GEN:  The patient appears stated age and is in NAD. HEENT:  Normocephalic, atraumatic.  The mucous membranes are moist. The superficial temporal arteries are without ropiness or tenderness. CV: Bradycardic.  Regular. Lungs:  CTAB Neck/HEME:  There are no carotid bruits bilaterally.  Neurological examination:  Orientation: The patient is alert and oriented x3. Fund of knowledge is appropriate.  Recent and remote memory are intact.  Attention and concentration are normal.    Able to name objects and repeat phrases. Cranial nerves: There is good facial symmetry. Pupils are equal round and reactive to light bilaterally. Fundoscopic exam reveals clear margins bilaterally. Extraocular muscles are intact. The visual fields are full to confrontational testing. The speech is fluent and clear. Soft palate rises symmetrically and there is no tongue deviation. Hearing is intact to  conversational tone. Sensation: Sensation is intact to light and pinprick throughout (facial, trunk, extremities). Vibration is intact at the bilateral big toe. There is no extinction with double simultaneous stimulation. There is no sensory dermatomal level identified. Motor: Strength is 5/5 in the bilateral upper and lower extremities.   Shoulder shrug is equal and symmetric.  There is no pronator drift. Deep tendon reflexes: Deep tendon reflexes are 2/4 at the bilateral biceps, triceps, brachioradialis, 2/4 at the right patella and 1/4 at the left patella and bilateral achilles. Plantar responses are downgoing bilaterally.  Movement examination: Tone: There is normal tone in the bilateral upper extremities.  The tone in the lower extremities is normal.  Abnormal movements: There is mild to moderate tremor of the outstretched hands, left somewhat worse than right.  This does not increase with intention.  It does not increase when given a weight.  He has trouble with Archimedes spirals, left worse than right.  There is some tremor in the legs that can be felt more than seen. Coordination:  There is no decremation with RAM's, with any form of RAMS, including alternating supination and pronation of the forearm, hand opening and closing, finger taps, heel taps and toe taps. Gait and Station: The patient has no difficulty arising out of a deep-seated chair without the use of the hands. The patient's stride length is normal, with normal arm swing.  He is slow to ambulate.  He has a cane, but does not use it with gait testing.  ASSESSMENT/PLAN:  1.  Tremor  -This is likely due to the lithium.  Studies are varied, but incidate that up to 2/3 of patients on lithium will have lithium-induced tremor, even if the patients are not toxic on the medication.  This is just a common side effect of patients on lithium.  I did  not recommend that he stop or taper the lithium, but rather talk to his prescribing physician  about this.  I did tell him that in my experience, lithium-induced tremor does not respond well to attempts at treating it with other medications.  He is currently on primidone for the treatment of tremor, but in my experience, this medication does not particularly help patients with lithium-induced tremor.  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  His wife believes that this medication was discontinued about a year ago because of this parkinsonism.  I saw no evidence of parkinsonism in him today and I reiterated that to him and his wife.  2.  Gait instability  -Patient and his wife describe some gait instability.  I did not particularly see that on my examination.  I think that is likely due to medication.  He is on many medications that could cause balance issues, including benzodiazepines and narcotic medications.  3.  The patient is to talk to his psychiatrist at the [facility] in [place] about the above.  He already has a neurologist at the [facility] interim.  This was purely a second opinion.  I told the patient and his wife that I do not need to see him back on a regular basis.  Greater than 50% of the 45-minute visit was spent in counseling with the patient and his wife.    Cc:  Clovis Riley, L.August Saucer, MD

## 2017-11-26 ENCOUNTER — Encounter: Payer: Self-pay | Admitting: Neurology

## 2017-11-26 ENCOUNTER — Ambulatory Visit: Payer: Medicare Other | Admitting: Neurology

## 2017-11-26 VITALS — BP 132/80 | HR 55 | Ht 70.0 in | Wt 202.0 lb

## 2017-11-26 DIAGNOSIS — G251 Drug-induced tremor: Secondary | ICD-10-CM

## 2017-11-26 DIAGNOSIS — R2681 Unsteadiness on feet: Secondary | ICD-10-CM | POA: Diagnosis not present

## 2018-01-18 ENCOUNTER — Telehealth: Payer: Self-pay | Admitting: Neurology

## 2018-01-18 NOTE — Telephone Encounter (Signed)
[facility] called and asked if the notes can be faxed to him from his December visit Fax # [phone] Attn: Charletta Cousinim Dailey

## 2018-01-19 NOTE — Telephone Encounter (Signed)
Note sent as requested.

## 2018-03-23 ENCOUNTER — Other Ambulatory Visit: Payer: Self-pay

## 2018-03-23 ENCOUNTER — Encounter (HOSPITAL_COMMUNITY): Payer: Self-pay

## 2018-03-23 ENCOUNTER — Emergency Department (HOSPITAL_COMMUNITY)
Admission: EM | Admit: 2018-03-23 | Discharge: 2018-03-23 | Disposition: A | Discharge status: Home / Self Care | Payer: Medicare Other | Attending: Emergency Medicine | Admitting: Emergency Medicine

## 2018-03-23 DIAGNOSIS — Z5321 Procedure and treatment not carried out due to patient leaving prior to being seen by health care provider: Secondary | ICD-10-CM | POA: Diagnosis not present

## 2018-03-23 DIAGNOSIS — L539 Erythematous condition, unspecified: Secondary | ICD-10-CM | POA: Insufficient documentation

## 2018-03-23 NOTE — ED Triage Notes (Signed)
Pt presents with redness to L forearm after getting a tattoo on Saturday.  Pt was seen at urgent care, given abx; reports that sometime today, his L hand began to tingle with pt having difficulty squeezing his hand.

## 2018-08-06 ENCOUNTER — Encounter: Payer: Self-pay | Admitting: Neurology

## 2018-08-30 ENCOUNTER — Encounter: Payer: Self-pay | Admitting: Neurology

## 2018-09-10 ENCOUNTER — Telehealth: Payer: Self-pay | Admitting: Neurology

## 2018-09-10 NOTE — Telephone Encounter (Signed)
Spoke with patient and made it very clear to him that Dr. Arbutus Leas does not treat pain. She saw him for tremor in December 2018. He has been referred back to [place] and has an appt on 09/21/18 and refused to cancel this appt. He does want to discuss balance issues, but again stressed this would not be a visit to discuss his back/neck pain. He sees pain management at the [place] and is unhappy with his care.

## 2018-09-10 NOTE — Telephone Encounter (Signed)
Patient needs to talk to someone about his pain and wants to know if we can give him something stronger or something to help with pain

## 2018-09-13 NOTE — Progress Notes (Signed)
Edem Tiegs was seen today in the movement disorders clinic for neurologic consultation at the request of Clovis Riley, L.August Saucer, MD.  The consultation is for the evaluation of PD.  I don't have records from his previous neurologist. Wife states that he was told by one doctor he had drug induced parkinsonism and another doctor told him it was PD.  It was never documented that way per wife.  Wife would like to know etiology for tremor It appears via med records that he has been seen previously by neurology at the [facility].  He was seen as an inpatient in 2015 by neurohospitalists Dr. Roseanne Reno and Dr. Thad Ranger for TME, which was felt due to medication.     Specific Symptoms:  Tremor: Yes.   Tremor for 2 years at least, both hands, worse with eating, holding a drink.  Not present at rest.  R hand may be worse but is right hand dominant.  He thinks that he has tremor in the legs.  (pt on lithium for 18 years; patient on risperdal for as long as our EMR takes [place] back, which was 2012.  They are not sure how long he has been off of that but may be a year; pt on primidone for tx - 50 mg tid.  This was just increased for the tx of tremor) Family hx of similar:  No. Voice: wife thinks that it has become more raspy/gravely Sleep: trouble staying asleep  Vivid Dreams:  No.  Acting out dreams:  No. Wet Pillows: No. Postural symptoms:  Yes.    Falls?  Yes.  , last fall within the last month in the sunroom.  Went to get up and fell down again Bradykinesia symptoms: difficulty getting out of a chair (but patient denies that but wife thinks that it is okay) Loss of smell:  No. Loss of taste:  No. Urinary Incontinence:  No. (better with meds) Difficulty Swallowing:  May have to "try" a few times to swallow saliva before goes down Handwriting, micrographia: No. Trouble with ADL's:  No.  Trouble buttoning clothing: Yes.  , due to tremor (wears t-shirts) Depression:  No. but "I am really pissed  off" about tremor.  Asks about DBS surgery Memory changes:  Yes.   N/V:  No. (some about a month ago but better) Lightheaded:  Yes.  , occasional when first gets up   Syncope: No. Diplopia:  No. Dyskinesia:  No.  Neuroimaging of the brain has previously been performed.  It is not available for my review today.  09/21/18 update: Patient is seen back today for complaints of tremor and balance trouble.  He is accompanied by his wife who supplements the history.  When I saw him last visit, I felt that his tremor was likely medication induced from lithium.  I did not see any balance issues last time on exam, but he did complain about it and felt that was likely due to multiple medications, including opioids and benzodiazepines.  He returns today off of lithium and on depakote. isnt sure how long he has been off of lithium but suspects that it was 8 months ago.  He states that tremor was much better when got off of lithium but on VPA now.   Remains on hydrocodone and morphine.  He has had falls.  Last fall was a few months ago.  Was going down the stairs and fell backward going down the stairs (even though was walking down them).  Prior  to that, no fall for a year.  He has intermittent paresthesias, "sometimes in the L shoulder and in the hands."  Cannot tell me if both hands or what fingers.  Feels like a phone is vibrating on L buttocks that comes and goes.  "all of my bones hurt.  Why won't they check me for bone cancer.  They have hurt since I was a kid."  ALLERGIES:   Allergies  Allergen Reactions  . Naproxen Other (See Comments)    Increases lithium toxicity.   . Gabapentin   . Morphine And Related     CURRENT MEDICATIONS:  Outpatient Encounter Medications as of 09/21/2018  Medication Sig  . aspirin EC 81 MG tablet Take 81 mg by mouth daily.  . busPIRone (BUSPAR) 5 MG tablet Take 5 mg by mouth 2 (two) times daily.  . Calcium Carb-Cholecalciferol (CALCIUM 600 + D PO) Take 1 tablet by mouth 2  (two) times daily.  . diclofenac sodium (VOLTAREN) 1 % GEL Apply topically 3 (three) times daily.  . Divalproex Sodium (DEPAKOTE PO) Take by mouth at bedtime.  . DULoxetine (CYMBALTA) 20 MG capsule Take 40 mg by mouth at bedtime.  Marland Kitchen HYDROcodone-acetaminophen (NORCO/VICODIN) 5-325 MG tablet Take 1 tablet by mouth 3 (three) times daily.  . methocarbamol (ROBAXIN) 500 MG tablet Take 1,000 mg by mouth 2 (two) times daily.  . MORPHINE SULFATE PO Take by mouth as needed.  . propranolol ER (INDERAL LA) 60 MG 24 hr capsule Take 60 mg by mouth daily.  . ranitidine (ZANTAC) 150 MG tablet Take 150 mg by mouth 2 (two) times daily.  . sildenafil (VIAGRA) 100 MG tablet Take 100 mg by mouth daily.  . tamsulosin (FLOMAX) 0.4 MG CAPS capsule Take 0.4 mg by mouth.  . traMADol (ULTRAM) 50 MG tablet Take 1 tablet by mouth 4 (four) times daily.  . traZODone (DESYREL) 50 MG tablet Take 50 mg by mouth at bedtime as needed for sleep.  . [DISCONTINUED] buPROPion (WELLBUTRIN SR) 150 MG 12 hr tablet Take 1 tablet (150 mg total) by mouth daily.  . [DISCONTINUED] clonazePAM (KLONOPIN) 1 MG tablet Take 0.5 mg by mouth daily.  . [DISCONTINUED] lansoprazole (PREVACID) 15 MG capsule Take 30 mg by mouth 2 (two) times daily.  . [DISCONTINUED] lithium carbonate (ESKALITH) 450 MG CR tablet Take 900 mg by mouth at bedtime.  . [DISCONTINUED] Melatonin 3 MG TABS Take 6 mg by mouth at bedtime.  . [DISCONTINUED] oxyCODONE-acetaminophen (PERCOCET) 7.5-325 MG tablet Take 1 tablet by mouth 3 (three) times daily.  . [DISCONTINUED] primidone (MYSOLINE) 50 MG tablet Take 2 tablets (100 mg total) by mouth at bedtime. (Patient taking differently: Take 50 mg by mouth 3 (three) times daily. )  . [DISCONTINUED] ranitidine (ZANTAC) 150 MG tablet Take 150 mg by mouth 2 (two) times daily.    No facility-administered encounter medications on file as of 09/21/2018.     PAST MEDICAL HISTORY:   Past Medical History:  Diagnosis Date  . Anxiety   .  Arthritis    "all through my body" (01/26/2014)  . Bipolar 1 disorder (HCC)   . Chronic lower back pain   . Depression   . Depression   . GERD (gastroesophageal reflux disease)   . Pneumonia 1952; 11/2013  . Prolapse, disk    lower back    PAST SURGICAL HISTORY:   Past Surgical History:  Procedure Laterality Date  . APPENDECTOMY  1961  . INGUINAL HERNIA REPAIR Left 1998  . TONSILLECTOMY  1976    SOCIAL HISTORY:   Social History   Socioeconomic History  . Marital status: Married    Spouse name: Not on file  . Number of children: Not on file  . Years of education: Not on file  . Highest education level: Not on file  Occupational History  . Occupation: disabled veteran  Social Needs  . Financial resource strain: Not on file  . Food insecurity:    Worry: Not on file    Inability: Not on file  . Transportation needs:    Medical: Not on file    Non-medical: Not on file  Tobacco Use  . Smoking status: Former Smoker    Packs/day: 1.00    Years: 25.00    Pack years: 25.00    Types: Cigarettes  . Smokeless tobacco: Never Used  . Tobacco comment: 01/26/2014 "quit smoking 15-16 yr ago"  Substance and Sexual Activity  . Alcohol use: No    Frequency: Never    Comment:  "quit drinking 25 yr ago; I'm a recovering alcoholic"  . Drug use: No  . Sexual activity: Not Currently  Lifestyle  . Physical activity:    Days per week: Not on file    Minutes per session: Not on file  . Stress: Not on file  Relationships  . Social connections:    Talks on phone: Not on file    Gets together: Not on file    Attends religious service: Not on file    Active member of club or organization: Not on file    Attends meetings of clubs or organizations: Not on file    Relationship status: Not on file  . Intimate partner violence:    Fear of current or ex partner: Not on file    Emotionally abused: Not on file    Physically abused: Not on file    Forced sexual activity: Not on file  Other  Topics Concern  . Not on file  Social History Narrative  . Not on file    FAMILY HISTORY:   Family Status  Relation Name Status  . Mother  Deceased  . Father  Deceased  . Sister  Deceased  . Sister x2 Alive  . Child x3 Alive    ROS:  Review of Systems  Constitutional: Negative.   HENT: Negative.   Eyes: Negative.   Respiratory: Negative.   Cardiovascular: Negative.   Gastrointestinal: Negative.   Musculoskeletal: Positive for myalgias.  Skin: Negative.   Neurological: Positive for tremors.  Psychiatric/Behavioral: Positive for memory loss.  '  PHYSICAL EXAMINATION:    VITALS:   Vitals:   09/21/18 1101  BP: 132/80  Pulse: 76  SpO2: 90%  Weight: 230 lb (104.3 kg)  Height: 5\' 10"  (1.778 m)    GEN:  The patient appears stated age and is in NAD. HEENT:  Normocephalic, atraumatic.  The mucous membranes are moist. The superficial temporal arteries are without ropiness or tenderness. CV:  RRR Lungs:  Diffuse expiratory wheezes in all 4 quadrants posteriorally Neck/HEME:  There are no carotid bruits bilaterally.  Neurological examination:  Orientation: The patient is alert and oriented x3. Cranial nerves: There is good facial symmetry. The speech is fluent and clear. Soft palate rises symmetrically and there is no tongue deviation. Hearing is intact to conversational tone. Sensation: Sensation is intact to light touch throughout.  Vibration is slightly decreased distally.  No extinction with DSS. Motor: Strength is 5/5 in the bilateral upper and lower extremities.  Shoulder shrug is equal and symmetric.  There is no pronator drift.  Movement examination: Tone: There is normal tone in the bilateral upper extremities.  The tone in the lower extremities is normal.  Abnormal movements: There is minimal tremor of the outstretches hands bilaterally.  This does not increase with intention.  Mild troubles with archimedes spirals.  Was holding a glass of water in his R hand  without a lid when I walked in and kept it there for 5 min and no water spilled and no tremor noted then Coordination:  There is no decremation with RAM's, with any form of RAMS, including alternating supination and pronation of the forearm, hand opening and closing, finger taps, heel taps and toe taps. Gait and Station: The patient pushes off of the chair to arise.  He has a cane and initially uses it.  Gait is wide-based.  He appears to have foot drop when walking on the left, but not with manual motor testing.  When he does not use the cane, gait is actually a little less antalgic.   ASSESSMENT/PLAN:  1.  Tremor  -This was worse when he was on lithium.  He is now off of it, but he also stopped his primidone at the same time and started Depakote.  He and I discussed that Depakote certainly can induce tremor as well, although not to the same degree that lithium did.  His tremor is better than when I saw him last visit.  Restarting primidone could be an option for him if he would like.  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  I did not see any evidence of this today.  He is off all antipsychotic medications.  2.  Gait instability  -probably multifactorial.  He does appear to have a bit of foot drop on the L when walking but not with MMT.  I do think that opioid medication contributes.  I would recommend an EMG to see if anything present (radiculopathy vs neuropathy).  He does have a pain management physician and understands that we will not be treating pain.  He apparently has already seen neurosurgery several years ago through the [facility] as well.  3.  We will call him following the results of his EMG test.    Cc:  Mitchell, L.August Saucer, MD

## 2018-09-14 ENCOUNTER — Ambulatory Visit: Payer: Medicare Other | Admitting: Neurology

## 2018-09-15 ENCOUNTER — Ambulatory Visit: Payer: Medicare Other | Admitting: Neurology

## 2018-09-21 ENCOUNTER — Telehealth: Payer: Self-pay | Admitting: Neurology

## 2018-09-21 ENCOUNTER — Ambulatory Visit (INDEPENDENT_AMBULATORY_CARE_PROVIDER_SITE_OTHER): Payer: No Typology Code available for payment source | Admitting: Neurology

## 2018-09-21 ENCOUNTER — Encounter: Payer: Self-pay | Admitting: Neurology

## 2018-09-21 VITALS — BP 132/80 | HR 76 | Ht 70.0 in | Wt 230.0 lb

## 2018-09-21 DIAGNOSIS — R2681 Unsteadiness on feet: Secondary | ICD-10-CM | POA: Diagnosis not present

## 2018-09-21 DIAGNOSIS — R251 Tremor, unspecified: Secondary | ICD-10-CM

## 2018-09-21 DIAGNOSIS — G251 Drug-induced tremor: Secondary | ICD-10-CM

## 2018-09-21 NOTE — Telephone Encounter (Signed)
Marcelino Duster called from the [place] needing the last Note 09/21/18 faxed to them at [phone]. Thanks

## 2018-09-21 NOTE — Telephone Encounter (Signed)
Note sent to fax provided.

## 2018-09-21 NOTE — Patient Instructions (Signed)
I think that going off of the lithium helped tremor but you also went off of the primidone (to help tremor) and on depakote (which causes some tremor).  If you would want, you could try to restart the primidone again.  We will schedule an EMG.

## 2018-09-30 ENCOUNTER — Ambulatory Visit: Payer: Medicare Other | Admitting: Neurology

## 2018-10-07 ENCOUNTER — Telehealth: Payer: Self-pay | Admitting: Neurology

## 2018-10-07 NOTE — Telephone Encounter (Signed)
[facility] called and [facility] to [phone]. Thanks

## 2018-10-07 NOTE — Telephone Encounter (Signed)
Note faxed.

## 2018-10-14 ENCOUNTER — Ambulatory Visit (INDEPENDENT_AMBULATORY_CARE_PROVIDER_SITE_OTHER): Payer: Medicare Other | Admitting: Neurology

## 2018-10-14 DIAGNOSIS — R2681 Unsteadiness on feet: Secondary | ICD-10-CM

## 2018-10-14 DIAGNOSIS — M5417 Radiculopathy, lumbosacral region: Secondary | ICD-10-CM

## 2018-10-14 NOTE — Procedures (Signed)
[facility]  [address], [address]  [place], [place] [address] Tel: [phone] Fax:  [phone] Test Date:  10/14/2018  Patient: Alan Brown DOB: 1951/06/04 Physician: Nita Sickle, DO  Sex: Male Height: 5\' 10"  Ref Phys: Kerin Salen, DO  ID#: [id number] Temp: 33.0C Technician:    Patient Complaints: This is a 67 year old man referred for evaluation of gait instability and falls.  NCV & EMG Findings: Extensive electrodiagnostic testing of the right lower extremity and additional studies of the left shows:  1. Bilateral sural and superficial peroneal sensory responses are within normal limits. 2. Bilateral tibial and peroneal (EDB) motor responses are within normal limits.  Bilateral peroneal motor responses at the tibialis anterior within normal limits. 3. Bilateral tibial H reflex studies are mildly prolonged. 4. Chronic motor axonal loss changes are seen affecting the L5-S1 myotomes bilaterally, without accompanied active denervation.  Impression: 1. Chronic L5-S1 radiculopathy affecting bilateral lower extremities, mild in degree electrically. 2. There is no evidence of a large fiber sensorimotor polyneuropathy.   ___________________________ Nita Sickle, DO    Nerve Conduction Studies Anti Sensory Summary Table   Site NR Peak (ms) Norm Peak (ms) P-T Amp (V) Norm P-T Amp  Left Sup Peroneal Anti Sensory (Ant Lat Mall)  33C  12 cm    2.4 <4.6 5.9 >3  Right Sup Peroneal Anti Sensory (Ant Lat Mall)  33C  12 cm    2.3 <4.6 3.8 >3  Left Sural Anti Sensory (Lat Mall)  33C  Calf    3.3 <4.6 6.6 >3  Right Sural Anti Sensory (Lat Mall)  33C  Calf    3.2 <4.6 6.5 >3   Motor Summary Table   Site NR Onset (ms) Norm Onset (ms) O-P Amp (mV) Norm O-P Amp Site1 Site2 Delta-0 (ms) Dist (cm) Vel (m/s) Norm Vel (m/s)  Left Peroneal Motor (Ext Dig Brev)  33C  Ankle    4.5 <6.0 1.0 >2.5 B Fib Ankle 9.3 38.0 41 >40  B Fib    13.8  0.8  Poplt B Fib 1.8 8.0 44  >40  Poplt    15.6  0.9         Right Peroneal Motor (Ext Dig Brev)  33C  Ankle    3.2 <6.0 1.3 >2.5 B Fib Ankle 9.9 41.0 41 >40  B Fib    13.1  0.9  Poplt B Fib 1.2 9.0 75 >40  Poplt    14.3  0.9         Left Peroneal TA Motor (Tib Ant)  33C  Fib Head    2.7 <4.5 3.8 >3 Poplit Fib Head 1.4 7.0 50 >40  Poplit    4.1  3.8         Right Peroneal TA Motor (Tib Ant)  33C  Fib Head    2.9 <4.5 4.1 >3 Poplit Fib Head 1.5 7.0 47 >40  Poplit    4.4  3.9         Left Tibial Motor (Abd Hall Brev)  33C  Ankle    3.6 <6.0 3.0 >4 Knee Ankle 9.8 41.0 42 >40  Knee    13.4  1.5         Right Tibial Motor (Abd Hall Brev)  33C  Ankle    3.3 <6.0 2.8 >4 Knee Ankle 10.7 45.0 42 >40  Knee    14.0  1.7          H Reflex Studies   NR  H-Lat (ms) Lat Norm (ms) L-R H-Lat (ms)  Left Tibial (Gastroc)  33C     37.41 <35 0.00  Right Tibial (Gastroc)  33C     37.41 <35 0.00   EMG   Side Muscle Ins Act Fibs Psw Fasc Number Recrt Dur Dur. Amp Amp. Poly Poly. Comment  Right AntTibialis Nml Nml Nml Nml 1- Rapid Few 1+ Few 1+ Nml Nml N/A  Right Gastroc Nml Nml Nml Nml 1- Rapid Few 1+ Few 1+ Nml Nml N/A  Right Flex Dig Long Nml Nml Nml Nml 1- Rapid Few 1+ Few 1+ Nml Nml N/A  Right BicepsFemSH Nml Nml Nml Nml 1- Rapid Few 1+ Few 1+ Nml Nml N/A  Right RectFemoris Nml Nml Nml Nml Nml Nml Nml Nml Nml Nml Nml Nml N/A  Right GluteusMed Nml Nml Nml Nml Nml Nml Nml Nml Nml Nml Nml Nml N/A  Left AntTibialis Nml Nml Nml Nml 1- Rapid Few 1+ Few 1+ Nml Nml N/A  Left Gastroc Nml Nml Nml Nml 1- Rapid Few 1+ Few 1+ Nml Nml N/A  Left Flex Dig Long Nml Nml Nml Nml 1- Rapid Few 1+ Few 1+ Nml Nml N/A  Left RectFemoris Nml Nml Nml Nml Nml Nml Nml Nml Nml Nml Nml Nml N/A  Left GluteusMed Nml Nml Nml Nml Nml Nml Nml Nml Nml Nml Nml Nml N/A      Waveforms:

## 2018-10-15 ENCOUNTER — Telehealth: Payer: Self-pay | Admitting: Neurology

## 2018-10-15 NOTE — Telephone Encounter (Signed)
Patient made aware. He gets pain medication from PCP - Alan Brown. EMG note sent to him. Patient wants to know a pain management provider. He doesn't have anything to write it down now and wants to call back to get a suggestion (states he doesn't have a computer and doesn't know how to use his phone to look that up).  If patient calls back I will make him aware of options:  Claudette Laws: [phone] [facility]: [phone] [facility]: [phone]

## 2018-10-15 NOTE — Telephone Encounter (Signed)
-----   Message from Octaviano Batty Tat, DO sent at 10/15/2018  7:57 AM EDT ----- Let pt know that EMG showed mild pinched nerve from low back on both sides.  Make sure that physician treating his pain has a copy.  I presume that physician has done an MRI lumbar spine as well?

## 2018-10-27 ENCOUNTER — Other Ambulatory Visit: Payer: Self-pay | Admitting: Physician Assistant

## 2018-10-27 ENCOUNTER — Ambulatory Visit
Admission: RE | Admit: 2018-10-27 | Discharge: 2018-10-27 | Disposition: A | Discharge status: Home / Self Care | Payer: 59 | Source: Ambulatory Visit | Attending: Physician Assistant | Admitting: Physician Assistant

## 2018-10-27 DIAGNOSIS — R059 Cough, unspecified: Secondary | ICD-10-CM

## 2018-10-27 DIAGNOSIS — R0602 Shortness of breath: Secondary | ICD-10-CM

## 2018-10-27 DIAGNOSIS — R05 Cough: Secondary | ICD-10-CM

## 2019-02-10 ENCOUNTER — Other Ambulatory Visit: Payer: Self-pay | Admitting: Neurology

## 2019-02-10 DIAGNOSIS — R202 Paresthesia of skin: Principal | ICD-10-CM

## 2019-02-10 DIAGNOSIS — R200 Anesthesia of skin: Secondary | ICD-10-CM

## 2019-02-17 ENCOUNTER — Encounter: Payer: 59 | Admitting: Neurology

## 2019-02-24 ENCOUNTER — Other Ambulatory Visit: Payer: Self-pay

## 2019-02-24 ENCOUNTER — Ambulatory Visit: Admitting: Neurology

## 2019-02-24 DIAGNOSIS — R202 Paresthesia of skin: Principal | ICD-10-CM

## 2019-02-24 DIAGNOSIS — R200 Anesthesia of skin: Secondary | ICD-10-CM

## 2019-02-24 NOTE — Procedures (Signed)
[facility]  [address] [place], [address]  [place], [place] [address] Tel: [phone] Fax:  [phone] Test Date:  02/24/2019  Patient: Alan Brown DOB: 1951/08/14 Physician: Nita Sickle, DO  Sex: Male Height: 5\' 10"  Ref Phys: Connye Burkitt  ID#: [id number] Temp: 34.2C Technician:    Patient Complaints: This is a 68 year old man referred for evaluation of generalized pain, numbness, and weakness.  NCV & EMG Findings: Nerve conduction study was prematurely terminated due to discomfort associated with testing.    Impression: This is an incomplete study.  Electrodiagnostic testing was not performed at patient's request due to pain.  ___________________________ Nita Sickle, DO    Nerve Conduction Studies Anti Sensory Summary Table   Site NR Peak (ms) Norm Peak (ms) P-T Amp (V) Norm P-T Amp  Right Median Anti Sensory (2nd Digit)  Wrist     <3.8 - >10

## 2019-03-22 ENCOUNTER — Telehealth (INDEPENDENT_AMBULATORY_CARE_PROVIDER_SITE_OTHER): Payer: Self-pay

## 2019-03-22 NOTE — Telephone Encounter (Signed)
Called patient and asked the screening questions.  Do you have now or have you had in the past 7 days a fever and/or chills? NO  Do you have now or have you had in the past 7 days a cough? NO  Do you have now or have you had in the last 7 days nausea, vomiting or abdominal pain? NO  Have you been exposed to anyone who has tested positive for COVID-19? NO  Have you or anyone who lives with you traveled within the last month? NO 

## 2019-03-23 ENCOUNTER — Other Ambulatory Visit: Payer: Self-pay

## 2019-03-23 ENCOUNTER — Encounter (INDEPENDENT_AMBULATORY_CARE_PROVIDER_SITE_OTHER): Payer: Self-pay | Admitting: Orthopaedic Surgery

## 2019-03-23 ENCOUNTER — Ambulatory Visit (INDEPENDENT_AMBULATORY_CARE_PROVIDER_SITE_OTHER): Payer: No Typology Code available for payment source | Admitting: Orthopaedic Surgery

## 2019-03-23 DIAGNOSIS — M75101 Unspecified rotator cuff tear or rupture of right shoulder, not specified as traumatic: Secondary | ICD-10-CM | POA: Diagnosis not present

## 2019-03-23 DIAGNOSIS — M19011 Primary osteoarthritis, right shoulder: Secondary | ICD-10-CM

## 2019-03-23 DIAGNOSIS — M7541 Impingement syndrome of right shoulder: Secondary | ICD-10-CM | POA: Diagnosis not present

## 2019-03-23 NOTE — Progress Notes (Signed)
Office Visit Note   Patient: Alan Brown           Date of Birth: 06/28/1951           MRN: [id number] Visit Date: 03/23/2019              Requested by: Clovis RileyMitchell, L.August Saucerean, MD [address] [place], [place] [address] PCP: Clovis RileyMitchell, L.August Saucerean, MD   Assessment & Plan: Visit Diagnoses:  1. Impingement syndrome of right shoulder   2. Tear of right supraspinatus tendon   3. Arthrosis of right acromioclavicular joint     Plan: Impression is right shoulder full-thickness supraspinatus tear and impingement syndrome along with biceps tendinopathy.  I was able to review the report of the [facility] only.  Based on these findings I have offered the patient arthroscopic surgery with repair and debridements as indicated.  We discussed the risks and benefits as well as rehab and recovery.  He agreed to proceed once we are able to schedule elective surgeries again.  In the meantime we will obtain preoperative clearance from his PCP Dr. Lupe Carneyean Mitchell.  He will need to stop his baby aspirin a week in advance of surgery.  Questions encouraged and answered. Total face to face encounter time was greater than 45 minutes and over half of this time was spent in counseling and/or coordination of care.  Follow-Up Instructions: Return if symptoms worsen or fail to improve.   Orders:  No orders of the defined types were placed in this encounter.  No orders of the defined types were placed in this encounter.     Procedures: No procedures performed   Clinical Data: No additional findings.   Subjective: Chief Complaint  Patient presents with  . Right Shoulder - Pain    Alan Brown is a 68 year old gentleman who comes in for chronic right shoulder pain with progressive worsening over the last 2 months.  He denies any recent injuries.  He states that the right shoulder and arm causing a lot of pain and dysfunction especially when he is active and using the arm.  The pain is  localized to the shoulder region.  He denies any radicular symptoms.  He has not had a subacromial injection or any sort of injections.  He denies any numbness and tingling.   Review of Systems  Constitutional: Negative.   All other systems reviewed and are negative.    Objective: Vital Signs: There were no vitals taken for this visit.  Physical Exam Vitals signs and nursing note reviewed.  Constitutional:      Appearance: He is well-developed.  HENT:     Head: Normocephalic and atraumatic.  Eyes:     Pupils: Pupils are equal, round, and reactive to light.  Neck:     Musculoskeletal: Neck supple.  Pulmonary:     Effort: Pulmonary effort is normal.  Abdominal:     Palpations: Abdomen is soft.  Musculoskeletal: Normal range of motion.  Skin:    General: Skin is warm.  Neurological:     Mental Status: He is alert and oriented to person, place, and time.  Psychiatric:        Behavior: Behavior normal.        Thought Content: Thought content normal.        Judgment: Judgment normal.     Ortho Exam Right shoulder exam shows normal passive range of motion with pain at the extremes.  Active range of motion is limited secondary  to pain.  Pain with empty can testing and infraspinatus testing.  Positive cross adduction body test.  Mildly positive Speed test. Specialty Comments:  No specialty comments available.  Imaging: No results found.   PMFS History: Patient Active Problem List   Diagnosis Date Noted  . Tear of right supraspinatus tendon 03/23/2019  . Arthrosis of right acromioclavicular joint 03/23/2019  . Depression 01/24/2014  . Polysubstance (including opioids) dependence, daily use (HCC) 01/24/2014  . Chronic pain syndrome 01/24/2014  . Sepsis (HCC) 01/23/2014  . Altered mental status 01/23/2014  . Acute encephalopathy 11/19/2013  . SIRS (systemic inflammatory response syndrome) (HCC) 11/19/2013  . Community acquired pneumonia 11/19/2013  . GERD  (gastroesophageal reflux disease) 11/19/2013  . Bipolar 1 disorder (HCC) 11/19/2013  . Lactic acidosis 11/19/2013  . Pneumonia 11/19/2013   Past Medical History:  Diagnosis Date  . Anxiety   . Arthritis    "all through my body" (01/26/2014)  . Bipolar 1 disorder (HCC)   . Chronic lower back pain   . Depression   . Depression   . GERD (gastroesophageal reflux disease)   . Pneumonia 1952; 11/2013  . Prolapse, disk    lower back    Family History  Problem Relation Age of Onset  . Hypertension Mother   . Hypertension Father   . Aneurysm Father   . Other Sister        killed  . Diabetes Sister   . Heart disease Sister     Past Surgical History:  Procedure Laterality Date  . APPENDECTOMY  1961  . INGUINAL HERNIA REPAIR Left 1998  . TONSILLECTOMY  1976   Social History   Occupational History  . Occupation: disabled veteran  Tobacco Use  . Smoking status: Former Smoker    Packs/day: 1.00    Years: 25.00    Pack years: 25.00    Types: Cigarettes  . Smokeless tobacco: Never Used  . Tobacco comment: 01/26/2014 "quit smoking 15-16 yr ago"  Substance and Sexual Activity  . Alcohol use: No    Frequency: Never    Comment:  "quit drinking 25 yr ago; I'm a recovering alcoholic"  . Drug use: No  . Sexual activity: Not Currently

## 2019-03-30 ENCOUNTER — Encounter (HOSPITAL_COMMUNITY): Payer: Self-pay | Admitting: *Deleted

## 2019-03-30 ENCOUNTER — Emergency Department (HOSPITAL_COMMUNITY)
Admission: EM | Admit: 2019-03-30 | Discharge: 2019-03-31 | Disposition: A | Discharge status: Home / Self Care | Payer: Medicare Other | Attending: Emergency Medicine | Admitting: Emergency Medicine

## 2019-03-30 ENCOUNTER — Other Ambulatory Visit: Payer: Self-pay

## 2019-03-30 DIAGNOSIS — Z20828 Contact with and (suspected) exposure to other viral communicable diseases: Secondary | ICD-10-CM | POA: Diagnosis not present

## 2019-03-30 DIAGNOSIS — Z791 Long term (current) use of non-steroidal anti-inflammatories (NSAID): Secondary | ICD-10-CM | POA: Diagnosis not present

## 2019-03-30 DIAGNOSIS — R251 Tremor, unspecified: Secondary | ICD-10-CM | POA: Diagnosis not present

## 2019-03-30 DIAGNOSIS — Z79899 Other long term (current) drug therapy: Secondary | ICD-10-CM | POA: Diagnosis not present

## 2019-03-30 DIAGNOSIS — Z7982 Long term (current) use of aspirin: Secondary | ICD-10-CM | POA: Insufficient documentation

## 2019-03-30 DIAGNOSIS — Z79891 Long term (current) use of opiate analgesic: Secondary | ICD-10-CM | POA: Diagnosis not present

## 2019-03-30 DIAGNOSIS — Z87891 Personal history of nicotine dependence: Secondary | ICD-10-CM | POA: Insufficient documentation

## 2019-03-30 DIAGNOSIS — R6883 Chills (without fever): Secondary | ICD-10-CM | POA: Diagnosis present

## 2019-03-30 NOTE — ED Notes (Signed)
Bed: OT15 Expected date:  Expected time:  Means of arrival:  Comments: EMS 68 yo male cough/fever/chills since yesterday

## 2019-03-30 NOTE — ED Notes (Signed)
Per EMS pt coming from home with a c/o low grade fever, chills, shortness of breath and confusion. Symptoms started two days ago. Per EMS pt's ex wife at his home reported he was confused, had a temperature of 100.4 (highest). EMS reports pt took 3 tylenols prior to their arrival.

## 2019-03-30 NOTE — ED Provider Notes (Addendum)
[place] DEPT Provider Note: Georgena Spurling, MD, FACEP  CSN: [id number] MRN: [id number] ARRIVAL: 03/30/19 at [place]: [place]  Level 5 caveat: Confusion HISTORY OF PRESENT ILLNESS  03/30/19 11:56 PM Alan Brown is a 67 y.o. male who complains of 2 days of shaking chills.  He thinks he may have had a fever but took Tylenol prior to arrival.  He has had a decreased appetite and is confused (he told his nurse that he was 68 years old).  He is aware that he is confused and having difficulty thinking.  He has had nasal congestion "sometimes" and coughing "sometimes".  He denies nausea, vomiting or diarrhea.  He denies, to me, shortness of breath or wheezing.  He denies pain except for chronic pain in his right shoulder.   Past Medical History:  Diagnosis Date   Anxiety    Arthritis    "all through my body" (01/26/2014)   Bipolar 1 disorder ([place])    Chronic lower back pain    Depression    Depression    GERD (gastroesophageal reflux disease)    Pneumonia 1952; 11/2013   Prolapse, disk    lower back    Past Surgical History:  Procedure Laterality Date   APPENDECTOMY  1961   INGUINAL HERNIA REPAIR Left 1998   TONSILLECTOMY  1976    Family History  Problem Relation Age of Onset   Hypertension Mother    Hypertension Father    Aneurysm Father    Other Sister        killed   Diabetes Sister    Heart disease Sister     Social History   Tobacco Use   Smoking status: Former Smoker    Packs/day: 1.00    Years: 25.00    Pack years: 25.00    Types: Cigarettes   Smokeless tobacco: Never Used   Tobacco comment: 01/26/2014 "quit smoking 15-16 yr ago"  Substance Use Topics   Alcohol use: No    Frequency: Never    Comment:  "quit drinking 25 yr ago; I'm a recovering alcoholic"   Drug use: No    Prior to Admission medications   Medication Sig Start Date End Date Taking? Authorizing Provider  aspirin EC 81  MG tablet Take 81 mg by mouth daily.    [provider]  busPIRone (BUSPAR) 5 MG tablet Take 5 mg by mouth 2 (two) times daily. 09/05/17   [provider]  Calcium Carb-Cholecalciferol (CALCIUM 600 + D PO) Take 1 tablet by mouth 2 (two) times daily.    [provider]  diclofenac sodium (VOLTAREN) 1 % GEL Apply topically 3 (three) times daily.    [provider]  Divalproex Sodium (DEPAKOTE PO) Take by mouth at bedtime.    [provider]  DULoxetine (CYMBALTA) 20 MG capsule Take 40 mg by mouth at bedtime.    [provider]  HYDROcodone-acetaminophen (NORCO/VICODIN) 5-325 MG tablet Take 1 tablet by mouth 3 (three) times daily.    [provider]  methocarbamol (ROBAXIN) 500 MG tablet Take 1,000 mg by mouth 2 (two) times daily.    [provider]  MORPHINE SULFATE PO Take by mouth as needed.    [provider]  propranolol ER (INDERAL LA) 60 MG 24 hr capsule Take 60 mg by mouth daily.    [provider]  ranitidine (ZANTAC) 150 MG tablet Take 150 mg by mouth 2 (two) times daily.  [provider]  sildenafil (VIAGRA) 100 MG tablet Take 100 mg by mouth daily. 09/12/17   [provider]  tamsulosin (FLOMAX) 0.4 MG CAPS capsule Take 0.4 mg by mouth.    [provider]  traMADol (ULTRAM) 50 MG tablet Take 1 tablet by mouth 4 (four) times daily. 09/22/17   [provider]  traZODone (DESYREL) 50 MG tablet Take 50 mg by mouth at bedtime as needed for sleep.    [provider]    Allergies Naproxen; Gabapentin; and Morphine and related   REVIEW OF SYSTEMS     PHYSICAL EXAMINATION  Initial Vital Signs Blood pressure 135/81, pulse 70, temperature 98.3 F (36.8 C), temperature source Oral, resp. rate 18, SpO2 98 %.  Examination General: Well-developed, well-nourished male in no acute distress; appearance consistent with age of record HENT: normocephalic;  atraumatic Eyes: pupils equal, round and reactive to light; extraocular muscles intact Neck: supple; no meningeal signs Heart: regular rate and rhythm Lungs: clear to auscultation bilaterally Abdomen: soft; nondistended; nontender; bowel sounds present Extremities: No deformity; full range of motion; pulses normal Neurologic: Awake, alert and oriented to person and place but not day, date, month or year; motor function intact in all extremities and symmetric; no facial droop; tremor that appears to be of lower frequency than that expected with a shaking chill Skin: Warm and dry Psychiatric: Normal mood and affect   RESULTS  Summary of this visit's results, reviewed by myself:   EKG Interpretation  Date/Time:    Ventricular Rate:    PR Interval:    QRS Duration:   QT Interval:    QTC Calculation:   R Axis:     Text Interpretation:        Laboratory Studies: Results for orders placed or performed during the hospital encounter of 03/30/19 (from the past 24 hour(s))  CBC with Differential/Platelet     Status: Abnormal   Collection Time: 03/31/19  1:05 AM  Result Value Ref Range   WBC 13.2 (H) 4.0 - 10.5 K/uL   RBC 4.55 4.22 - 5.81 MIL/uL   Hemoglobin 14.0 13.0 - 17.0 g/dL   HCT 43.0 39.0 - 52.0 %   MCV 94.5 80.0 - 100.0 fL   MCH 30.8 26.0 - 34.0 pg   MCHC 32.6 30.0 - 36.0 g/dL   RDW 14.4 11.5 - 15.5 %   Platelets 184 150 - 400 K/uL   nRBC 0.0 0.0 - 0.2 %   Neutrophils Relative % 70 %   Neutro Abs 9.2 (H) 1.7 - 7.7 K/uL   Lymphocytes Relative 21 %   Lymphs Abs 2.8 0.7 - 4.0 K/uL   Monocytes Relative 8 %   Monocytes Absolute 1.1 (H) 0.1 - 1.0 K/uL   Eosinophils Relative 0 %   Eosinophils Absolute 0.0 0.0 - 0.5 K/uL   Basophils Relative 0 %   Basophils Absolute 0.0 0.0 - 0.1 K/uL   Immature Granulocytes 1 %   Abs Immature Granulocytes 0.09 (H) 0.00 - 0.07 K/uL  Comprehensive metabolic panel     Status: Abnormal   Collection Time: 03/31/19  1:05 AM  Result Value Ref  Range   Sodium 138 135 - 145 mmol/L   Potassium 3.3 (L) 3.5 - 5.1 mmol/L   Chloride 108 98 - 111 mmol/L   CO2 20 (L) 22 - 32 mmol/L   Glucose, Bld 123 (H) 70 - 99 mg/dL   BUN 24 (H) 8 - 23 mg/dL   Creatinine, Ser 1.08 0.61 -  1.24 mg/dL   Calcium 8.8 (L) 8.9 - 10.3 mg/dL   Total Protein 7.7 6.5 - 8.1 g/dL   Albumin 4.0 3.5 - 5.0 g/dL   AST 61 (H) 15 - 41 U/L   ALT 21 0 - 44 U/L   Alkaline Phosphatase 49 38 - 126 U/L   Total Bilirubin 0.6 0.3 - 1.2 mg/dL   GFR calc non Af Amer >60 >60 mL/min   GFR calc Af Amer >60 >60 mL/min   Anion gap 10 5 - 15  Lactic acid, plasma     Status: Abnormal   Collection Time: 03/31/19  1:05 AM  Result Value Ref Range   Lactic Acid, Venous 2.5 (HH) 0.5 - 1.9 mmol/L  Urinalysis, Routine w reflex microscopic     Status: None   Collection Time: 03/31/19  1:05 AM  Result Value Ref Range   Color, Urine YELLOW YELLOW   APPearance CLEAR CLEAR   Specific Gravity, Urine 1.021 1.005 - 1.030   pH 6.0 5.0 - 8.0   Glucose, UA NEGATIVE NEGATIVE mg/dL   Hgb urine dipstick NEGATIVE NEGATIVE   Bilirubin Urine NEGATIVE NEGATIVE   Ketones, ur NEGATIVE NEGATIVE mg/dL   Protein, ur NEGATIVE NEGATIVE mg/dL   Nitrite NEGATIVE NEGATIVE   Leukocytes,Ua NEGATIVE NEGATIVE  SARS Coronavirus 2 [facility] order, Performed in [facility] hospital lab)     Status: None   Collection Time: 03/31/19  1:05 AM  Result Value Ref Range   SARS Coronavirus 2 NEGATIVE NEGATIVE  Lactic acid, plasma     Status: None   Collection Time: 03/31/19  4:39 AM  Result Value Ref Range   Lactic Acid, Venous 1.3 0.5 - 1.9 mmol/L   Imaging Studies: Dg Chest Port 1 View  Result Date: 03/31/2019 CLINICAL DATA:  Low-grade fever and shortness of breath for several hours EXAM: PORTABLE CHEST 1 VIEW COMPARISON:  10/27/2018 FINDINGS: Cardiac shadow is stable. Aortic calcifications are again seen. Lungs are well aerated bilaterally without focal infiltrate or sizable effusion. No acute bony abnormality is  noted. IMPRESSION: No acute abnormality noted. Electronically Signed   By: Inez Catalina M.D.   On: 03/31/2019 00:27    ED COURSE and MDM  Nursing notes and initial vitals signs, including pulse oximetry, reviewed.  Vitals:   03/31/19 0230 03/31/19 0300 03/31/19 0330 03/31/19 0435  BP: (!) 157/86 (!) 148/82 (!) 147/91 (!) 141/97  Pulse: 62 62 64   Resp: (!) 25 (!) 28 (!) 35   Temp:  98 F (36.7 C)    TempSrc:  Oral    SpO2: 94% 95% 94%    Alan Brown was evaluated in Emergency Department on 03/31/2019 for the symptoms described in the history of present illness. He was evaluated in the context of the global COVID-19 pandemic, which necessitated consideration that the patient might be at risk for infection with the SARS-CoV-2 virus that causes COVID-19. Institutional protocols and algorithms that pertain to the evaluation of patients at risk for COVID-19 are in a state of rapid change based on information released by regulatory bodies including the CDC and federal and state organizations. These policies and algorithms were followed during the patient's care in the ED.  12:05 AM Sepsis and COVID-19 protocols initiated.  5:19 AM Patient has not met sepsis criteria and is negative for COVID-19.  He has not been febrile in the emergency department.  He is now awake, alert and oriented.  He continues to have a tremor but this appears to  be a neurologic tremor and not shaking chills as it is lower frequency.  There is no evidence of an acute bacterial infection but blood cultures are pending.  I do not see a need for admission at this time and the patient is in agreement with this.  He will return if symptoms worsen or change.  His confusion on arrival may be due to his chronic narcotic medication.   PROCEDURES    ED DIAGNOSES     ICD-10-CM   1. Tremor R25.1        Shanon Rosser, MD 03/31/19 [id number]    Shanon Rosser, MD 03/31/19 Delrae Rend

## 2019-03-31 ENCOUNTER — Emergency Department (HOSPITAL_COMMUNITY): Payer: Medicare Other

## 2019-03-31 LAB — CBC WITH DIFFERENTIAL/PLATELET
Abs Immature Granulocytes: 0.09 10*3/uL — ABNORMAL HIGH (ref 0.00–0.07)
Basophils Absolute: 0 10*3/uL (ref 0.0–0.1)
Basophils Relative: 0 %
Eosinophils Absolute: 0 10*3/uL (ref 0.0–0.5)
Eosinophils Relative: 0 %
HCT: 43 % (ref 39.0–52.0)
Hemoglobin: 14 g/dL (ref 13.0–17.0)
Immature Granulocytes: 1 %
Lymphocytes Relative: 21 %
Lymphs Abs: 2.8 10*3/uL (ref 0.7–4.0)
MCH: 30.8 pg (ref 26.0–34.0)
MCHC: 32.6 g/dL (ref 30.0–36.0)
MCV: 94.5 fL (ref 80.0–100.0)
Monocytes Absolute: 1.1 10*3/uL — ABNORMAL HIGH (ref 0.1–1.0)
Monocytes Relative: 8 %
Neutro Abs: 9.2 10*3/uL — ABNORMAL HIGH (ref 1.7–7.7)
Neutrophils Relative %: 70 %
Platelets: 184 10*3/uL (ref 150–400)
RBC: 4.55 MIL/uL (ref 4.22–5.81)
RDW: 14.4 % (ref 11.5–15.5)
WBC: 13.2 10*3/uL — ABNORMAL HIGH (ref 4.0–10.5)
nRBC: 0 % (ref 0.0–0.2)

## 2019-03-31 LAB — URINALYSIS, ROUTINE W REFLEX MICROSCOPIC
Bilirubin Urine: NEGATIVE
Glucose, UA: NEGATIVE mg/dL
Hgb urine dipstick: NEGATIVE
Ketones, ur: NEGATIVE mg/dL
Leukocytes,Ua: NEGATIVE
Nitrite: NEGATIVE
Protein, ur: NEGATIVE mg/dL
Specific Gravity, Urine: 1.021 (ref 1.005–1.030)
pH: 6 (ref 5.0–8.0)

## 2019-03-31 LAB — COMPREHENSIVE METABOLIC PANEL
ALT: 21 U/L (ref 0–44)
AST: 61 U/L — ABNORMAL HIGH (ref 15–41)
Albumin: 4 g/dL (ref 3.5–5.0)
Alkaline Phosphatase: 49 U/L (ref 38–126)
Anion gap: 10 (ref 5–15)
BUN: 24 mg/dL — ABNORMAL HIGH (ref 8–23)
CO2: 20 mmol/L — ABNORMAL LOW (ref 22–32)
Calcium: 8.8 mg/dL — ABNORMAL LOW (ref 8.9–10.3)
Chloride: 108 mmol/L (ref 98–111)
Creatinine, Ser: 1.08 mg/dL (ref 0.61–1.24)
GFR calc Af Amer: >60 mL/min (ref >60)
GFR calc non Af Amer: >60 mL/min (ref >60)
Glucose, Bld: 123 mg/dL — ABNORMAL HIGH (ref 70–99)
Potassium: 3.3 mmol/L — ABNORMAL LOW (ref 3.5–5.1)
Sodium: 138 mmol/L (ref 135–145)
Total Bilirubin: 0.6 mg/dL (ref 0.3–1.2)
Total Protein: 7.7 g/dL (ref 6.5–8.1)

## 2019-03-31 LAB — SARS CORONAVIRUS 2 BY RT PCR (HOSPITAL ORDER, PERFORMED IN ~~LOC~~ HOSPITAL LAB): SARS Coronavirus 2: NEGATIVE

## 2019-03-31 LAB — LACTIC ACID, PLASMA
Lactic Acid, Venous: 2.5 mmol/L (ref 0.5–1.9)
Lactic Acid, Venous: 1.3 mmol/L (ref 0.5–1.9)

## 2019-03-31 MED ORDER — SODIUM CHLORIDE 0.9 % IV BOLUS
1000.0000 mL | Freq: Once | INTRAVENOUS | Status: AC
  Administered 2019-03-31: 1000 mL via INTRAVENOUS

## 2019-03-31 MED ORDER — ACETAMINOPHEN 325 MG PO TABS
650.0000 mg | ORAL_TABLET | Freq: Once | ORAL | Status: AC
  Administered 2019-03-31: 650 mg via ORAL
  Filled 2019-03-31: qty 2

## 2019-03-31 NOTE — ED Notes (Signed)
Lab called to notify pt has lactic acid 2.5. Dr Read Drivers made aware.

## 2019-04-05 LAB — CULTURE, BLOOD (ROUTINE X 2)
Culture: NO GROWTH
Culture: NO GROWTH
Special Requests: ADEQUATE
Special Requests: ADEQUATE

## 2019-04-22 ENCOUNTER — Encounter (HOSPITAL_COMMUNITY): Payer: Self-pay | Admitting: Emergency Medicine

## 2019-04-22 ENCOUNTER — Other Ambulatory Visit: Payer: Self-pay

## 2019-04-22 ENCOUNTER — Inpatient Hospital Stay (HOSPITAL_COMMUNITY)
Admission: EM | Admit: 2019-04-22 | Discharge: 2019-04-27 | DRG: 091 | Disposition: A | Discharge status: Home Health | Payer: No Typology Code available for payment source | Attending: Internal Medicine | Admitting: Internal Medicine

## 2019-04-22 ENCOUNTER — Emergency Department (HOSPITAL_COMMUNITY): Payer: No Typology Code available for payment source

## 2019-04-22 DIAGNOSIS — R4182 Altered mental status, unspecified: Secondary | ICD-10-CM

## 2019-04-22 DIAGNOSIS — F329 Major depressive disorder, single episode, unspecified: Secondary | ICD-10-CM | POA: Diagnosis present

## 2019-04-22 DIAGNOSIS — G9340 Encephalopathy, unspecified: Secondary | ICD-10-CM | POA: Diagnosis present

## 2019-04-22 DIAGNOSIS — Y92009 Unspecified place in unspecified non-institutional (private) residence as the place of occurrence of the external cause: Secondary | ICD-10-CM

## 2019-04-22 DIAGNOSIS — G92 Toxic encephalopathy: Secondary | ICD-10-CM | POA: Diagnosis not present

## 2019-04-22 DIAGNOSIS — M21372 Foot drop, left foot: Secondary | ICD-10-CM | POA: Diagnosis present

## 2019-04-22 DIAGNOSIS — I10 Essential (primary) hypertension: Secondary | ICD-10-CM | POA: Diagnosis present

## 2019-04-22 DIAGNOSIS — G4700 Insomnia, unspecified: Secondary | ICD-10-CM | POA: Diagnosis present

## 2019-04-22 DIAGNOSIS — J9602 Acute respiratory failure with hypercapnia: Secondary | ICD-10-CM | POA: Diagnosis present

## 2019-04-22 DIAGNOSIS — M545 Low back pain: Secondary | ICD-10-CM | POA: Diagnosis present

## 2019-04-22 DIAGNOSIS — T50915A Adverse effect of multiple unspecified drugs, medicaments and biological substances, initial encounter: Secondary | ICD-10-CM | POA: Diagnosis present

## 2019-04-22 DIAGNOSIS — Z7982 Long term (current) use of aspirin: Secondary | ICD-10-CM

## 2019-04-22 DIAGNOSIS — Z833 Family history of diabetes mellitus: Secondary | ICD-10-CM

## 2019-04-22 DIAGNOSIS — G894 Chronic pain syndrome: Secondary | ICD-10-CM | POA: Diagnosis present

## 2019-04-22 DIAGNOSIS — E876 Hypokalemia: Secondary | ICD-10-CM | POA: Diagnosis not present

## 2019-04-22 DIAGNOSIS — N179 Acute kidney failure, unspecified: Secondary | ICD-10-CM | POA: Diagnosis present

## 2019-04-22 DIAGNOSIS — F419 Anxiety disorder, unspecified: Secondary | ICD-10-CM | POA: Diagnosis present

## 2019-04-22 DIAGNOSIS — J9601 Acute respiratory failure with hypoxia: Secondary | ICD-10-CM | POA: Diagnosis present

## 2019-04-22 DIAGNOSIS — F1120 Opioid dependence, uncomplicated: Secondary | ICD-10-CM | POA: Diagnosis present

## 2019-04-22 DIAGNOSIS — F319 Bipolar disorder, unspecified: Secondary | ICD-10-CM | POA: Diagnosis present

## 2019-04-22 DIAGNOSIS — Z20828 Contact with and (suspected) exposure to other viral communicable diseases: Secondary | ICD-10-CM | POA: Diagnosis present

## 2019-04-22 DIAGNOSIS — F32A Depression, unspecified: Secondary | ICD-10-CM | POA: Diagnosis present

## 2019-04-22 DIAGNOSIS — Z87891 Personal history of nicotine dependence: Secondary | ICD-10-CM

## 2019-04-22 DIAGNOSIS — F1920 Other psychoactive substance dependence, uncomplicated: Secondary | ICD-10-CM | POA: Diagnosis present

## 2019-04-22 DIAGNOSIS — Z8249 Family history of ischemic heart disease and other diseases of the circulatory system: Secondary | ICD-10-CM

## 2019-04-22 DIAGNOSIS — J449 Chronic obstructive pulmonary disease, unspecified: Secondary | ICD-10-CM | POA: Diagnosis present

## 2019-04-22 LAB — POCT I-STAT 7, (LYTES, BLD GAS, ICA,H+H)
Acid-Base Excess: 4 mmol/L — ABNORMAL HIGH (ref 0.0–2.0)
Bicarbonate: 31.7 mmol/L — ABNORMAL HIGH (ref 20.0–28.0)
Calcium, Ion: 1.26 mmol/L (ref 1.15–1.40)
HCT: 33 % — ABNORMAL LOW (ref 39.0–52.0)
Hemoglobin: 11.2 g/dL — ABNORMAL LOW (ref 13.0–17.0)
O2 Saturation: 100 %
Patient temperature: 97.7
Potassium: 3.8 mmol/L (ref 3.5–5.1)
Sodium: 140 mmol/L (ref 135–145)
TCO2: 34 mmol/L — ABNORMAL HIGH (ref 22–32)
pCO2 arterial: 62.2 mmHg — ABNORMAL HIGH (ref 32.0–48.0)
pH, Arterial: 7.312 — ABNORMAL LOW (ref 7.350–7.450)
pO2, Arterial: 188 mmHg — ABNORMAL HIGH (ref 83.0–108.0)

## 2019-04-22 LAB — COMPREHENSIVE METABOLIC PANEL
ALT: 14 U/L (ref 0–44)
AST: 37 U/L (ref 15–41)
Albumin: 3.2 g/dL — ABNORMAL LOW (ref 3.5–5.0)
Alkaline Phosphatase: 39 U/L (ref 38–126)
Anion gap: 11 (ref 5–15)
BUN: 15 mg/dL (ref 8–23)
CO2: 29 mmol/L (ref 22–32)
Calcium: 8.7 mg/dL — ABNORMAL LOW (ref 8.9–10.3)
Chloride: 100 mmol/L (ref 98–111)
Creatinine, Ser: 1.43 mg/dL — ABNORMAL HIGH (ref 0.61–1.24)
GFR calc Af Amer: 58 mL/min — ABNORMAL LOW (ref >60)
GFR calc non Af Amer: 50 mL/min — ABNORMAL LOW (ref >60)
Glucose, Bld: 134 mg/dL — ABNORMAL HIGH (ref 70–99)
Potassium: 4.4 mmol/L (ref 3.5–5.1)
Sodium: 140 mmol/L (ref 135–145)
Total Bilirubin: 0.4 mg/dL (ref 0.3–1.2)
Total Protein: 6 g/dL — ABNORMAL LOW (ref 6.5–8.1)

## 2019-04-22 LAB — CBC WITH DIFFERENTIAL/PLATELET
Abs Immature Granulocytes: 0.05 10*3/uL (ref 0.00–0.07)
Basophils Absolute: 0 10*3/uL (ref 0.0–0.1)
Basophils Relative: 0 %
Eosinophils Absolute: 0.3 10*3/uL (ref 0.0–0.5)
Eosinophils Relative: 4 %
HCT: 38.8 % — ABNORMAL LOW (ref 39.0–52.0)
Hemoglobin: 12.4 g/dL — ABNORMAL LOW (ref 13.0–17.0)
Immature Granulocytes: 1 %
Lymphocytes Relative: 27 %
Lymphs Abs: 2 10*3/uL (ref 0.7–4.0)
MCH: 30.4 pg (ref 26.0–34.0)
MCHC: 32 g/dL (ref 30.0–36.0)
MCV: 95.1 fL (ref 80.0–100.0)
Monocytes Absolute: 0.9 10*3/uL (ref 0.1–1.0)
Monocytes Relative: 12 %
Neutro Abs: 4.1 10*3/uL (ref 1.7–7.7)
Neutrophils Relative %: 56 %
Platelets: 148 10*3/uL — ABNORMAL LOW (ref 150–400)
RBC: 4.08 MIL/uL — ABNORMAL LOW (ref 4.22–5.81)
RDW: 13.9 % (ref 11.5–15.5)
WBC: 7.4 10*3/uL (ref 4.0–10.5)
nRBC: 0 % (ref 0.0–0.2)

## 2019-04-22 LAB — TSH: TSH: 0.451 u[IU]/mL (ref 0.350–4.500)

## 2019-04-22 LAB — SARS CORONAVIRUS 2 BY RT PCR (HOSPITAL ORDER, PERFORMED IN ~~LOC~~ HOSPITAL LAB): SARS Coronavirus 2: NEGATIVE

## 2019-04-22 LAB — SALICYLATE LEVEL: Salicylate Lvl: <7 mg/dL (ref 2.8–30.0)

## 2019-04-22 LAB — ACETAMINOPHEN LEVEL: Acetaminophen (Tylenol), Serum: <10 ug/mL — ABNORMAL LOW (ref 10–30)

## 2019-04-22 LAB — LIPASE, BLOOD: Lipase: 22 U/L (ref 11–51)

## 2019-04-22 LAB — PROTIME-INR
INR: 1.1 (ref 0.8–1.2)
Prothrombin Time: 14.1 seconds (ref 11.4–15.2)

## 2019-04-22 LAB — TROPONIN I: Troponin I: <0.03 ng/mL (ref <0.03)

## 2019-04-22 LAB — ETHANOL: Alcohol, Ethyl (B): <10 mg/dL (ref <10)

## 2019-04-22 LAB — LACTIC ACID, PLASMA: Lactic Acid, Venous: 1.1 mmol/L (ref 0.5–1.9)

## 2019-04-22 LAB — AMMONIA: Ammonia: 20 umol/L (ref 9–35)

## 2019-04-22 MED ORDER — NALOXONE HCL 0.4 MG/ML IJ SOLN
0.4000 mg | Freq: Once | INTRAMUSCULAR | Status: AC
  Administered 2019-04-22: 0.4 mg via INTRAVENOUS
  Filled 2019-04-22: qty 1

## 2019-04-22 MED ORDER — NALOXONE HCL 2 MG/2ML IJ SOSY
1.0000 mg | PREFILLED_SYRINGE | Freq: Once | INTRAMUSCULAR | Status: AC
  Administered 2019-04-23: 1 mg via INTRAVENOUS
  Filled 2019-04-22: qty 2

## 2019-04-22 MED ORDER — LACTATED RINGERS IV BOLUS
1000.0000 mL | Freq: Once | INTRAVENOUS | Status: AC
  Administered 2019-04-22: 1000 mL via INTRAVENOUS

## 2019-04-22 NOTE — ED Notes (Signed)
Pt lethargic and unable to follow most commands/answer questions. Resident aware

## 2019-04-22 NOTE — ED Notes (Signed)
Pt transported to CT ?

## 2019-04-22 NOTE — ED Triage Notes (Addendum)
Pt coming by EMS after reporting by family that pt has been lethargic and having diff. ambulating since Wednesday at 1600. Symptoms got better per family on Thursday but then symptoms returned Friday morning 0300. Hx of stroke and TIA. 80% on RA, pt placed on nonrebreather. Left and right leg deficit noted by family and EMS. Pt hypotensive with EMS with most recent BP of 112/46.

## 2019-04-22 NOTE — ED Provider Notes (Signed)
[facility] EMERGENCY DEPARTMENT Provider Note   CSN: [id number] Arrival date & time: 04/22/19  2056  History   Chief Complaint Chief Complaint  Patient presents with  . Stroke Symptoms    HPI 68 year old male with a history of bipolar, GERD, polysubstance abuse, anxiety presents with altered mental status.  Per EMS, patient was having difficulty ambulating and was more lethargic 2 days ago.  Symptoms improved yesterday but then returned around 3 AM this morning.  EMS is information was provided by wife. First responders were initially concerned about right-sided weakness and EMS felt like patient might be more weak on the left.  EMS reports that he was also hypoxic with SPO2 in the low 80s which improved with nonrebreather.  On arrival, he is somnolent and unable to provide further history.   Past Medical History:  Diagnosis Date  . Anxiety   . Arthritis    "all through my body" (01/26/2014)  . Bipolar 1 disorder ([facility])   . Chronic lower back pain   . Depression   . Depression   . GERD (gastroesophageal reflux disease)   . Pneumonia 1952; 11/2013  . Prolapse, disk    lower back    Patient Active Problem List   Diagnosis Date Noted  . Tear of right supraspinatus tendon 03/23/2019  . Arthrosis of right acromioclavicular joint 03/23/2019  . Depression 01/24/2014  . Polysubstance (including opioids) dependence, daily use (HCC) 01/24/2014  . Chronic pain syndrome 01/24/2014  . Sepsis ([facility]CC) 01/23/2014  . Altered mental status 01/23/2014  . Acute encephalopathy 11/19/2013  . SIRS (systemic inflammatory response syndrome) ([facility]) 11/19/2013  . Community acquired pneumonia 11/19/2013  . GERD (gastroesophageal reflux disease) 11/19/2013  . Bipolar 1 disorder ([facility]) 11/19/2013  . Lactic acidosis 11/19/2013  . Pneumonia 11/19/2013    Past Surgical History:  Procedure Laterality Date  . APPENDECTOMY  1961  . INGUINAL HERNIA REPAIR Left 1998  . TONSILLECTOMY  1976         Home Medications    Prior to Admission medications   Medication Sig Start Date End Date Taking? Authorizing Provider  aspirin EC 81 MG tablet Take 81 mg by mouth daily.    [provider]  busPIRone (BUSPAR) 5 MG tablet Take 5 mg by mouth 2 (two) times daily. 09/05/17   [provider]  Calcium Carb-Cholecalciferol (CALCIUM 600 + D PO) Take 1 tablet by mouth 2 (two) times daily.    [provider]  diclofenac sodium (VOLTAREN) 1 % GEL Apply topically 3 (three) times daily.    [provider]  Divalproex Sodium (DEPAKOTE PO) Take by mouth at bedtime.    [provider]  DULoxetine (CYMBALTA) 20 MG capsule Take 40 mg by mouth at bedtime.    [provider]  HYDROcodone-acetaminophen (NORCO/VICODIN) 5-325 MG tablet Take 1 tablet by mouth 3 (three) times daily.    [provider]  methocarbamol (ROBAXIN) 500 MG tablet Take 1,000 mg by mouth 2 (two) times daily.    [provider]  MORPHINE SULFATE PO Take by mouth as needed.    [provider]  propranolol ER (INDERAL LA) 60 MG 24 hr capsule Take 60 mg by mouth daily.    [provider]  ranitidine (ZANTAC) 150 MG tablet Take 150 mg by mouth 2 (two) times daily.    [provider]  sildenafil (VIAGRA) 100 MG tablet Take 100 mg by mouth daily. 09/12/17   [provider]  tamsulosin [facility])  0.4 MG CAPS capsule Take 0.4 mg by mouth.    [provider]  traMADol (ULTRAM) 50 MG tablet Take 1 tablet by mouth 4 (four) times daily. 09/22/17   [provider]  traZODone (DESYREL) 50 MG tablet Take 50 mg by mouth at bedtime as needed for sleep.    [provider]    Family History Family History  Problem Relation Age of Onset  . Hypertension Mother   . Hypertension Father   . Aneurysm Father   . Other Sister        killed  . Diabetes Sister   . Heart disease Sister     Social History Social History    Tobacco Use  . Smoking status: Former Smoker    Packs/day: 1.00    Years: 25.00    Pack years: 25.00    Types: Cigarettes  . Smokeless tobacco: Never Used  . Tobacco comment: 01/26/2014 "quit smoking 15-16 yr ago"  Substance Use Topics  . Alcohol use: No    Frequency: Never    Comment:  "quit drinking 25 yr ago; I'm a recovering alcoholic"  . Drug use: No     Allergies   Naproxen; Gabapentin; and Morphine and related   Review of Systems Review of Systems  Unable to perform ROS: Mental status change     Physical Exam Updated Vital Signs BP 104/85   Pulse 64   Temp 97.7 F (36.5 C) (Axillary)   Resp 19   SpO2 97%   Physical Exam Vitals signs and nursing note reviewed.  Constitutional:      Appearance: He is well-developed. He is obese.     Comments: Somnolent  HENT:     Head: Normocephalic and atraumatic.  Eyes:     Conjunctiva/sclera: Conjunctivae normal.  Neck:     Musculoskeletal: Neck supple.  Cardiovascular:     Rate and Rhythm: Normal rate and regular rhythm.     Heart sounds: No murmur.  Pulmonary:     Effort: Pulmonary effort is normal. No respiratory distress.     Breath sounds: Normal breath sounds.     Comments: Satting 100% on 15 L nonrebreather Abdominal:     Palpations: Abdomen is soft.     Tenderness: There is no abdominal tenderness.  Musculoskeletal: Normal range of motion.        General: No swelling.  Skin:    General: Skin is warm and dry.  Neurological:     General: No focal deficit present.     Comments: Patient oriented to person and place Following commands intermittently Moves all extremities       ED Treatments / Results  Labs (all labs ordered are listed, but only abnormal results are displayed) Labs Reviewed  CBC WITH DIFFERENTIAL/PLATELET - Abnormal; Notable for the following components:      Result Value   RBC 4.08 (*)    Hemoglobin 12.4 (*)    HCT 38.8 (*)    Platelets 148 (*)    All other components within  normal limits  COMPREHENSIVE METABOLIC PANEL - Abnormal; Notable for the following components:   Glucose, Bld 134 (*)    Creatinine, Ser 1.43 (*)    Calcium 8.7 (*)    Total Protein 6.0 (*)    Albumin 3.2 (*)    GFR calc non Af Amer 50 (*)    GFR calc Af Amer 58 (*)    All other components within normal limits  ACETAMINOPHEN LEVEL - Abnormal; Notable for  the following components:   Acetaminophen (Tylenol), Serum <10 (*)    All other components within normal limits  POCT I-STAT 7, (LYTES, BLD GAS, ICA,H+H) - Abnormal; Notable for the following components:   pH, Arterial 7.312 (*)    pCO2 arterial 62.2 (*)    pO2, Arterial 188.0 (*)    Bicarbonate 31.7 (*)    TCO2 34 (*)    Acid-Base Excess 4.0 (*)    HCT 33.0 (*)    Hemoglobin 11.2 (*)    All other components within normal limits  SARS CORONAVIRUS 2 (HOSPITAL ORDER, PERFORMED IN [facility])  URINE CULTURE  CULTURE, BLOOD (ROUTINE X 2)  CULTURE, BLOOD (ROUTINE X 2)  LIPASE, BLOOD  PROTIME-INR  ETHANOL  TSH  SALICYLATE LEVEL  TROPONIN I  LACTIC ACID, PLASMA  AMMONIA  RAPID URINE DRUG SCREEN, HOSP PERFORMED  LACTIC ACID, PLASMA  BLOOD GAS, ARTERIAL  VALPROIC ACID LEVEL    EKG EKG Interpretation  Date/Time:  Friday Apr 22 2019 21:21:36 EDT Ventricular Rate:  67 PR Interval:    QRS Duration: 101 QT Interval:  435 QTC Calculation: 460 R Axis:   68 Text Interpretation:  Sinus rhythm Atrial premature complex When compared to prior, no significiant changes seen.  No STEMI Confirmed by Theda Belfastegeler, Chris ([id number]) on 04/22/2019 9:23:24 PM   Radiology Ct Head Wo Contrast  Result Date: 04/22/2019 CLINICAL DATA:  Altered level of consciousness EXAM: CT HEAD WITHOUT CONTRAST TECHNIQUE: Contiguous axial images were obtained from the base of the skull through the vertex without intravenous contrast. COMPARISON:  01/23/2014 FINDINGS: Brain: No evidence of acute infarction, hemorrhage, hydrocephalus, extra-axial collection  or mass lesion/mass effect. Vascular: No hyperdense vessel or unexpected calcification. Skull: Normal. Negative for fracture or focal lesion. Sinuses/Orbits: No acute finding. Other: None. IMPRESSION: No acute intracranial abnormality noted. Electronically Signed   By: Alcide CleverMark  Lukens M.D.   On: 04/22/2019 22:09   Dg Chest Portable 1 View  Result Date: 04/22/2019 CLINICAL DATA:  68 year old male with altered mental status. EXAM: PORTABLE CHEST 1 VIEW COMPARISON:  Chest radiograph dated 03/30/2019 FINDINGS: There is shallow inspiration with bibasilar atelectasis. Mild chronic bronchitic changes. Mild eventration of the right hemidiaphragm. No focal consolidation, large pleural effusion, or pneumothorax. The cardiac silhouette is within normal limits. No acute osseous pathology. IMPRESSION: No active disease. Electronically Signed   By: Elgie CollardArash  Radparvar M.D.   On: 04/22/2019 21:32    Procedures Procedures (including critical care time)  Medications Ordered in ED Medications  naloxone [facility](NARCAN) injection 1 mg (has no administration in time range)  naloxone [facility](NARCAN) injection 0.4 mg (0.4 mg Intravenous Given 04/22/19 2223)  lactated ringers bolus 1,000 mL (0 mLs Intravenous Stopped 04/22/19 2245)     Initial Impression / Assessment and Plan / ED Course  I have reviewed the triage vital signs and the nursing notes.  Pertinent labs & imaging results that were available during my care of the patient were reviewed by me and considered in my medical decision making (see chart for details).  68 year old male with a history of bipolar, GERD, anxiety presents with altered mental status.  Patient hypotensive and hypoxic on arrival.  Patient requiring 15 L nonrebreather.  Patient does not appear to be in respiratory distress and his lungs are clear bilaterally.  Per chart review, patient does have history of pneumonia and his symptoms are concerning for sepsis.  Code sepsis initiated.  Labs  Chest x-ray shows no  active disease.  CT head shows  no acute intracranial abnormality.  CBC notable for WBC 7.4, hemoglobin 12.4, platelets 148.  CMP notable for glucose 134, creatinine 1.43.  Lipase 22.  INR 1.1.  Lactic acid 1.1.  Ethanol, Tylenol, salicylate undetectable.  Ammonia 20.  pH 7.3/PCO2 62.2/PO2 188 bicarb 31.7.  SARS-CoV-2 negative.  Patient placed on BiPAP.    Polypharmacy and PE remain on the differential.  Will obtain CT PE study.  Scan not performed at time of signout to Dr. Preston Fleeting.  Patient admitted to the hospitalist for further management.   Final Clinical Impressions(s) / ED Diagnoses   Final diagnoses:  None    ED Discharge Orders    None       Vallery Ridge, MD 04/22/19 2357    Tegeler, Canary Brim, MD 04/23/19 1135

## 2019-04-23 ENCOUNTER — Encounter (HOSPITAL_COMMUNITY): Payer: Self-pay | Admitting: Internal Medicine

## 2019-04-23 ENCOUNTER — Observation Stay (HOSPITAL_COMMUNITY): Payer: No Typology Code available for payment source

## 2019-04-23 DIAGNOSIS — G9340 Encephalopathy, unspecified: Secondary | ICD-10-CM

## 2019-04-23 DIAGNOSIS — J9601 Acute respiratory failure with hypoxia: Secondary | ICD-10-CM

## 2019-04-23 DIAGNOSIS — G92 Toxic encephalopathy: Principal | ICD-10-CM

## 2019-04-23 DIAGNOSIS — F319 Bipolar disorder, unspecified: Secondary | ICD-10-CM

## 2019-04-23 DIAGNOSIS — R4182 Altered mental status, unspecified: Secondary | ICD-10-CM

## 2019-04-23 DIAGNOSIS — G894 Chronic pain syndrome: Secondary | ICD-10-CM

## 2019-04-23 LAB — TROPONIN I
Troponin I: <0.03 ng/mL (ref <0.03)
Troponin I: <0.03 ng/mL (ref <0.03)
Troponin I: <0.03 ng/mL (ref <0.03)

## 2019-04-23 LAB — BLOOD GAS, ARTERIAL
Acid-Base Excess: 4.1 mmol/L — ABNORMAL HIGH (ref 0.0–2.0)
Bicarbonate: 27.6 mmol/L (ref 20.0–28.0)
Delivery systems: POSITIVE
Drawn by: 274071
Expiratory PAP: 8
FIO2: 40
Inspiratory PAP: 16
O2 Saturation: 99.3 %
Patient temperature: 98.6
pCO2 arterial: 38 mmHg (ref 32.0–48.0)
pH, Arterial: 7.474 — ABNORMAL HIGH (ref 7.350–7.450)
pO2, Arterial: 159 mmHg — ABNORMAL HIGH (ref 83.0–108.0)

## 2019-04-23 LAB — CBC WITH DIFFERENTIAL/PLATELET
Abs Immature Granulocytes: 0.05 10*3/uL (ref 0.00–0.07)
Basophils Absolute: 0 10*3/uL (ref 0.0–0.1)
Basophils Relative: 0 %
Eosinophils Absolute: 0.2 10*3/uL (ref 0.0–0.5)
Eosinophils Relative: 4 %
HCT: 37.9 % — ABNORMAL LOW (ref 39.0–52.0)
Hemoglobin: 12.3 g/dL — ABNORMAL LOW (ref 13.0–17.0)
Immature Granulocytes: 1 %
Lymphocytes Relative: 29 %
Lymphs Abs: 1.8 10*3/uL (ref 0.7–4.0)
MCH: 30.8 pg (ref 26.0–34.0)
MCHC: 32.5 g/dL (ref 30.0–36.0)
MCV: 95 fL (ref 80.0–100.0)
Monocytes Absolute: 0.6 10*3/uL (ref 0.1–1.0)
Monocytes Relative: 10 %
Neutro Abs: 3.4 10*3/uL (ref 1.7–7.7)
Neutrophils Relative %: 56 %
Platelets: 153 10*3/uL (ref 150–400)
RBC: 3.99 MIL/uL — ABNORMAL LOW (ref 4.22–5.81)
RDW: 14.1 % (ref 11.5–15.5)
WBC: 6 10*3/uL (ref 4.0–10.5)
nRBC: 0 % (ref 0.0–0.2)

## 2019-04-23 LAB — POCT I-STAT 7, (LYTES, BLD GAS, ICA,H+H)
Acid-Base Excess: 7 mmol/L — ABNORMAL HIGH (ref 0.0–2.0)
Bicarbonate: 32.7 mmol/L — ABNORMAL HIGH (ref 20.0–28.0)
Calcium, Ion: 1.23 mmol/L (ref 1.15–1.40)
HCT: 36 % — ABNORMAL LOW (ref 39.0–52.0)
Hemoglobin: 12.2 g/dL — ABNORMAL LOW (ref 13.0–17.0)
O2 Saturation: 97 %
Patient temperature: 97.7
Potassium: 4 mmol/L (ref 3.5–5.1)
Sodium: 139 mmol/L (ref 135–145)
TCO2: 34 mmol/L — ABNORMAL HIGH (ref 22–32)
pCO2 arterial: 49.1 mmHg — ABNORMAL HIGH (ref 32.0–48.0)
pH, Arterial: 7.429 (ref 7.350–7.450)
pO2, Arterial: 92 mmHg (ref 83.0–108.0)

## 2019-04-23 LAB — URINALYSIS, ROUTINE W REFLEX MICROSCOPIC
Bilirubin Urine: NEGATIVE
Glucose, UA: NEGATIVE mg/dL
Hgb urine dipstick: NEGATIVE
Ketones, ur: 5 mg/dL — AB
Leukocytes,Ua: NEGATIVE
Nitrite: NEGATIVE
Protein, ur: NEGATIVE mg/dL
Specific Gravity, Urine: 1.04 — ABNORMAL HIGH (ref 1.005–1.030)
pH: 6 (ref 5.0–8.0)

## 2019-04-23 LAB — HEPATIC FUNCTION PANEL
ALT: 13 U/L (ref 0–44)
AST: 30 U/L (ref 15–41)
Albumin: 3 g/dL — ABNORMAL LOW (ref 3.5–5.0)
Alkaline Phosphatase: 39 U/L (ref 38–126)
Bilirubin, Direct: <0.1 mg/dL (ref 0.0–0.2)
Total Bilirubin: 0.5 mg/dL (ref 0.3–1.2)
Total Protein: 6 g/dL — ABNORMAL LOW (ref 6.5–8.1)

## 2019-04-23 LAB — BASIC METABOLIC PANEL
Anion gap: 10 (ref 5–15)
BUN: 13 mg/dL (ref 8–23)
CO2: 24 mmol/L (ref 22–32)
Calcium: 8.7 mg/dL — ABNORMAL LOW (ref 8.9–10.3)
Chloride: 103 mmol/L (ref 98–111)
Creatinine, Ser: 1.11 mg/dL (ref 0.61–1.24)
GFR calc Af Amer: >60 mL/min (ref >60)
GFR calc non Af Amer: >60 mL/min (ref >60)
Glucose, Bld: 128 mg/dL — ABNORMAL HIGH (ref 70–99)
Potassium: 4.2 mmol/L (ref 3.5–5.1)
Sodium: 137 mmol/L (ref 135–145)

## 2019-04-23 LAB — RETICULOCYTES
Immature Retic Fract: 14.5 % (ref 2.3–15.9)
RBC.: 3.99 MIL/uL — ABNORMAL LOW (ref 4.22–5.81)
Retic Count, Absolute: 63.4 10*3/uL (ref 19.0–186.0)
Retic Ct Pct: 1.6 % (ref 0.4–3.1)

## 2019-04-23 LAB — GLUCOSE, CAPILLARY
Glucose-Capillary: 112 mg/dL — ABNORMAL HIGH (ref 70–99)
Glucose-Capillary: 99 mg/dL (ref 70–99)
Glucose-Capillary: 72 mg/dL (ref 70–99)
Glucose-Capillary: 59 mg/dL — ABNORMAL LOW (ref 70–99)
Glucose-Capillary: 63 mg/dL — ABNORMAL LOW (ref 70–99)
Glucose-Capillary: 71 mg/dL (ref 70–99)
Glucose-Capillary: 96 mg/dL (ref 70–99)
Glucose-Capillary: 97 mg/dL (ref 70–99)
Glucose-Capillary: 120 mg/dL — ABNORMAL HIGH (ref 70–99)
Glucose-Capillary: 130 mg/dL — ABNORMAL HIGH (ref 70–99)

## 2019-04-23 LAB — IRON AND TIBC
Iron: 35 ug/dL — ABNORMAL LOW (ref 45–182)
Saturation Ratios: 12 % — ABNORMAL LOW (ref 17.9–39.5)
TIBC: 281 ug/dL (ref 250–450)
UIBC: 246 ug/dL

## 2019-04-23 LAB — BRAIN NATRIURETIC PEPTIDE: B Natriuretic Peptide: 34.2 pg/mL (ref 0.0–100.0)

## 2019-04-23 LAB — RAPID URINE DRUG SCREEN, HOSP PERFORMED
Amphetamines: NOT DETECTED
Barbiturates: POSITIVE — AB
Benzodiazepines: NOT DETECTED
Cocaine: NOT DETECTED
Opiates: NOT DETECTED
Tetrahydrocannabinol: NOT DETECTED

## 2019-04-23 LAB — VITAMIN B12: Vitamin B-12: 790 pg/mL (ref 180–914)

## 2019-04-23 LAB — VALPROIC ACID LEVEL: Valproic Acid Lvl: 58 ug/mL (ref 50.0–100.0)

## 2019-04-23 LAB — FERRITIN: Ferritin: 34 ng/mL (ref 24–336)

## 2019-04-23 LAB — FOLATE: Folate: 45 ng/mL (ref >5.9)

## 2019-04-23 LAB — LACTIC ACID, PLASMA: Lactic Acid, Venous: 1.8 mmol/L (ref 0.5–1.9)

## 2019-04-23 MED ORDER — DEXTROSE-NACL 5-0.9 % IV SOLN
INTRAVENOUS | Status: DC
  Administered 2019-04-23 – 2019-04-24 (×3): via INTRAVENOUS

## 2019-04-23 MED ORDER — ONDANSETRON HCL 4 MG/2ML IJ SOLN: 4.0000 mg | Freq: Four times a day (QID) | INTRAMUSCULAR | Status: DC | PRN

## 2019-04-23 MED ORDER — ONDANSETRON HCL 4 MG PO TABS: 4.0000 mg | ORAL_TABLET | Freq: Four times a day (QID) | ORAL | Status: DC | PRN

## 2019-04-23 MED ORDER — ACETAMINOPHEN 650 MG RE SUPP: 650.0000 mg | Freq: Four times a day (QID) | RECTAL | Status: DC | PRN

## 2019-04-23 MED ORDER — IOHEXOL 350 MG/ML SOLN
100.0000 mL | Freq: Once | INTRAVENOUS | Status: AC | PRN
  Administered 2019-04-23: 100 mL via INTRAVENOUS

## 2019-04-23 MED ORDER — DEXTROSE 50 % IV SOLN
12.5000 g | INTRAVENOUS | Status: AC
  Administered 2019-04-23: 12.5 g via INTRAVENOUS

## 2019-04-23 MED ORDER — ACETAMINOPHEN 325 MG PO TABS
650.0000 mg | ORAL_TABLET | Freq: Four times a day (QID) | ORAL | Status: DC | PRN
  Administered 2019-04-25 – 2019-04-26 (×4): 650 mg via ORAL
  Filled 2019-04-23 (×4): qty 2

## 2019-04-23 MED ORDER — DEXTROSE 50 % IV SOLN
INTRAVENOUS | Status: AC
  Filled 2019-04-23: qty 50

## 2019-04-23 MED ORDER — HYDROMORPHONE HCL 1 MG/ML IJ SOLN
0.5000 mg | Freq: Once | INTRAMUSCULAR | Status: AC
  Administered 2019-04-23: 0.5 mg via INTRAVENOUS
  Filled 2019-04-23: qty 0.5

## 2019-04-23 MED ORDER — SODIUM CHLORIDE 0.9 % IV SOLN
INTRAVENOUS | Status: DC
  Administered 2019-04-23 (×2): via INTRAVENOUS

## 2019-04-23 NOTE — Progress Notes (Signed)
Patient transferred to 3W11 w/o complications on the NIV machine.

## 2019-04-23 NOTE — Consult Note (Addendum)
Neurology Consultation  Reason for Consult: Altered mental status, tremors Referring Physician: Dr. Toniann Fail  CC: Altered mental status, tremors  History is obtained from: Chart review  HPI: Alan Brown is a 68 y.o. male who has a significant past medical history of anxiety, bipolar disorder, depression, tremors, chronic pain, polysubstance abuse and based on neurological chart review-"intermittent lethargy and confusion along with periods of unresponsiveness dating back to 01/2011", presented to the emergency room today for evaluation of altered mental status. No family members available at bedside.  According to chart review, patient had been having difficulty ambulating and was more lethargic over the last couple of days.  His symptoms improved the day prior to presentation but again since the morning of yesterday, he continued to be more lethargic and was brought via EMS to the emergency room for evaluation.  Initially, EMS reported that the patient felt to be weak on the left but he was also hypoxic with his oxygen saturations in the low 80s, that improved with a nonrebreather and he continued to be somnolent-blood gas was done and was significant for pH 7.3, PCO2 62.2, PO2 188, bicarb 31.7.  SARS-CoV-2 negative. Admitted to hospitalist for further management.  Upon the evaluation by the hospitalist, patient was having some tremulous movements of his right side.  He was also difficult to arouse in spite of being on BiPAP.  For this, neurological consultation was obtained for concern for seizures versus other causes of altered mental status. Patient was unable to provide any history or review of systems.  He has past medical history as well as family history documented in the chart.  His social history was also reviewed from the chart and he is unable to provide at this time.  Patient is a follow-up patient of [facility].  Most recent clinical encounter with Dr. Allena Katz for EMG  nerve conduction studies for numbness and tingling in both hands, which she could not complete due to pain. Established patient of Dr. Lurena Joiner Tat at the movement disorders clinic of [facility], very was referred for tremors and possible drug-induced parkinsonism, which according to Dr. Arbutus Leas seems unlikely.  He also gets his care at the [facility] and most of his medications are dispensed through the [place].  At some point was on lithium and his tremor was worse at that time.  He is off of lithium now.  According to Dr. 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  He was prescribed primidone and Depakote which he has unknown compliance to.  He also has multifactorial gait instability with foot drop on the left which has been attributed to his chronic lumbar radiculopathy versus neuropathy.   LKW: 3 days ago tpa given?: no, nonfocal examination Premorbid modified Rankin scale (mRS): Unable to ascertain  ROS: ROS was performed and is negative except as noted in the HPI.   Past Medical History:  Diagnosis Date  . Anxiety   . Arthritis    "all through my body" (01/26/2014)  . Bipolar 1 disorder (HCC)   . Chronic lower back pain   . Depression   . Depression   . GERD (gastroesophageal reflux disease)   . Pneumonia 1952; 11/2013  . Prolapse, disk    lower back    Family History  Problem Relation Age of Onset  . Hypertension Mother   .  Hypertension Father   . Aneurysm Father   . Other Sister        killed  . Diabetes Sister   . Heart disease Sister     Social History:   reports that he has quit smoking. His smoking use included cigarettes. He has a 25.00 pack-year smoking history. He has never used smokeless tobacco. He reports that he does not drink alcohol or use drugs.  Medications  Current Facility-Administered Medications:  .  0.9  %  sodium chloride infusion, , Intravenous, Continuous, Eduard ClosKakrakandy, Arshad N, MD .  acetaminophen (TYLENOL) tablet 650 mg, 650 mg, Oral, Q6H PRN **OR** acetaminophen (TYLENOL) suppository 650 mg, 650 mg, Rectal, Q6H PRN, Eduard ClosKakrakandy, Arshad N, MD .  ondansetron (ZOFRAN) tablet 4 mg, 4 mg, Oral, Q6H PRN **OR** ondansetron (ZOFRAN) injection 4 mg, 4 mg, Intravenous, Q6H PRN, Eduard ClosKakrakandy, Arshad N, MD  Exam: Current vital signs: BP (!) 144/82 (BP Location: Right Arm)   Pulse 67   Temp 99.3 F (37.4 C) (Oral)   Resp 20   SpO2 (!) 64%  Vital signs in last 24 hours: Temp:  [97.7 F (36.5 C)-99.3 F (37.4 C)] 99.3 F (37.4 C) (05/09 0241) Pulse Rate:  [60-68] 67 (05/09 0241) Resp:  [14-22] 20 (05/09 0241) BP: (89-147)/(56-87) 144/82 (05/09 0241) SpO2:  [64 %-100 %] 64 % (05/09 0241) FiO2 (%):  [40 %] 40 % (05/09 0011) General: Patient is obtunded, does not open eyes to voice, does not seem to be in distress HEENT: Normocephalic, atraumatic Lungs: He is on a BiPAP, equal air entry bilaterally CVS: S1-S2 heard regular rate rhythm Abdomen: Soft nondistended nontender Extremities and skin: Heavily tattooed, warm well perfused extremities with intact pulses and no edema bilaterally Neurological exam Appears obtunded, does not open eyes to voice.  Actively resists eye opening. Does not seem to be in any distress. Upon passively trying to move his limbs, resists that movement to with some paratonia. Completely nonverbal.  Does not follow any commands Unable to assess speech Cranial nerves: Pupils are equal round reactive light, unable to check for extraocular movements, face appears symmetric.  His breathing spontaneously and as strong corneal reflexes bilaterally Motor exam: Actively withdraws all 4 extremities to noxious stimulation. Sensory exam as above Increased tone while attempting to maneuver his extremities passively-volitionally pulling away. Upon trying to raise his arm above his  face, he catches his arm before it softly hits his face. Gait testing was deferred at this time   Labs I have reviewed labs in epic and the results pertinent to this consultation are:   CBC    Component Value Date/Time   WBC 7.4 04/22/2019 2114   RBC 4.08 (L) 04/22/2019 2114   HGB 12.2 (L) 04/23/2019 0121   HCT 36.0 (L) 04/23/2019 0121   PLT 148 (L) 04/22/2019 2114   MCV 95.1 04/22/2019 2114   MCH 30.4 04/22/2019 2114   MCHC 32.0 04/22/2019 2114   RDW 13.9 04/22/2019 2114   LYMPHSABS 2.0 04/22/2019 2114   MONOABS 0.9 04/22/2019 2114   EOSABS 0.3 04/22/2019 2114   BASOSABS 0.0 04/22/2019 2114    CMP     Component Value Date/Time   NA 139 04/23/2019 0121   K 4.0 04/23/2019 0121   CL 100 04/22/2019 2114   CO2 29 04/22/2019 2114   GLUCOSE 134 (H) 04/22/2019 2114   BUN 15 04/22/2019 2114   CREATININE 1.43 (H) 04/22/2019 2114   CALCIUM 8.7 (L) 04/22/2019 2114   PROT 6.0 (L)  04/22/2019 2114   ALBUMIN 3.2 (L) 04/22/2019 2114   AST 37 04/22/2019 2114   ALT 14 04/22/2019 2114   ALKPHOS 39 04/22/2019 2114   BILITOT 0.4 04/22/2019 2114   GFRNONAA 50 (L) 04/22/2019 2114   GFRAA 58 (L) 04/22/2019 2114  Urinary toxicology screen positive for barbiturates-likely from primidone Depakote level 58-unclear if this is for mood or seizures-no documented history of seizures. Ammonia level: 20  Imaging I have reviewed the images obtained:  CT-scan of the brain- no acute abnormality. CT chest with contrast PE protocol was done-negative for PE  Chest x-ray reported unremarkable  Assessment: 68 year old with significant past history of anxiety/bipolar disorder/depression/chronic pain/polysubstance abuse/tremors/multiple evaluations before for intermittent lethargy confusion along with periods of unresponsiveness ever since 2012 presenting for evaluation of altered mental status and worsening mentation for the past 2 to 3 days. Noted to have some hypercarbia, which is improving with  noninvasive ventilation. His examination has a functional overlay to it. I would attribute his current presentation to toxic metabolic encephalopathy versus behavioral etiology.  He will benefit from further imaging in the form of an MRI and EEG but at this point I think supportive care, carefully going through his medication list to ensure that there is no polypharmacy and possibly a psych consult would be beneficial for him  Impression: Toxic metabolic encephalopathy Evaluate for possible polypharmacy Evaluate for underlying infections Evaluate for conversion disorder  Recommendations: MRI brain EEG Check TSH, B12, RPR Check urinalysis Do not see a need for antiepileptics Correction of toxic metabolic derangements including respiratory derangements per primary team as you are Careful review of medications to evaluate for polypharmacy. Minimize all sedating medications When mentation improves and allows, obtain a psychiatry consult Try to attempt getting records from the [facility], might be difficult given the weekend etc. We will follow with you after the testing results become available -- Milon Dikes, MD [facility] Pager: [phone] If 7pm to 7am, please call on call as listed on AMION.

## 2019-04-23 NOTE — Progress Notes (Signed)
Arrived to patients room to check patient while on BIPAP, pt had disconnected hose from BIPAP trying to pull mask off. I asked pt if he was ready to come off mask and he stated yes. Pt placed on 4L Katherine. RR 21.

## 2019-04-23 NOTE — Progress Notes (Signed)
MRI called nurse to see if patient can be put on list to be scanned. Nurse called respiratory therapist to assess whether patient could be taken off bipap for procedure. After playing with several settings on the bipap to try and wean patient off, RT stated that she did not feel comfortable in being able to safely take patient off of the bipap because he was still having long periods of apnea during her trials. Dr. Edson Snowball notified and aware.

## 2019-04-23 NOTE — Progress Notes (Signed)
Attempted to get pt for MRI, RN states pt is off bipap but is very restless and in a lot of pain. RN to call MRI if the pt will be able to complete the exam tonight otherwise we will check back in the am 5/10.

## 2019-04-23 NOTE — Progress Notes (Signed)
Alan Brown is a 68 y.o. male with history of chronic pain, depression and tremors was brought to the ER the patient's family noticed that patient was increasingly altered in mental status.  Symptoms started 3 days ago which improved briefly but again became more pronounced yesterday and was brought to the ER for further evaluation.    1. Acute encephalopathy ? Metabolic.  Probably from polypharmacy. EEG negative.  Ct HEAD negative.  No response to narcan.    2. Acute respiratory failure with hypoxia of unclear etiology.  CT angio did not show any pulmonary embolism or pneumonia or pleural effusion or pulm edema.    Continue to monitor.   Kathlen Mody, MD [phone]

## 2019-04-23 NOTE — Care Management (Signed)
Notified by [facility] that patient is active for [facility] RN.

## 2019-04-23 NOTE — ED Notes (Signed)
Report given to 3W RN. All questions answered.  

## 2019-04-23 NOTE — Procedures (Addendum)
ELECTROENCEPHALOGRAM REPORT   Patient: Alan Brown       Room #: 9J28A EEG No. ID: [id number] Age: 67 y.o.        Sex: male Referring Physician: Blake Divine Report Date:  04/23/2019        Interpreting Physician: Thana Farr  History: Eann Atkinson is an 68 y.o. male with altered mental status and tremors  Medications:  None  Conditions of Recording:  This is a 21 channel routine scalp EEG performed with bipolar and monopolar montages arranged in accordance to the international 10/20 system of electrode placement. One channel was dedicated to EKG recording.  The patient is in the poorly responsive state.  Description:  The background activity has the appearance of stage II sleep with a background that is slow and irregular with superimposed symmetrical sleep spindles and vertex central sharp transients.  The patient has some occasional jerks during the recording, particularly of the right upper extremity.  On occasion they are accompanied by right sided artifact but on most occasions no correlate is noted.  Patient also has two episodes when he has deep breathing and then tremors throughout.  Despite the artifact noted with these episodes no distinct epileptiform activity is noted.    The patient was not awake during the recording for wakefulness to be evaluated.   Hyperventilation and intermittent photic stimulation were not performed.  IMPRESSION: This electroencephalogram exhibits normal sleep.  Also noted are two episodes when the patient has deep breathing and generalized tremors.  There is no epileptic correlate with these episodes.     Thana Farr, MD Neurology [phone] 04/23/2019, 1:45 PM

## 2019-04-23 NOTE — H&P (Addendum)
History and Physical    Alan Brown ZOX:[id number] DOB: 1951-05-25 DOA: 04/22/2019  PCP: Alan Brown, L.Alan Saucer, MD  Alan Brown coming from: Home.  Chief Complaint: Altered mental status.  She obtained from ER physician who obtained history from Alan Brown's wife.  HPI: Alan Brown is a 68 y.o. male with history of chronic pain, depression and tremors was brought to the ER the Alan Brown's family noticed that Alan Brown was increasingly altered in mental status.  Symptoms started 3 days ago which improved briefly but again became more pronounced yesterday and was brought to the ER.  Alan Brown is on chronic pain medications and there was no mention of any suicidal thoughts of overdose.  ED Course: In the ER Alan Brown was lethargic but was responding to his name.  Afebrile.  CT head was unremarkable.  Alan Brown was hypoxic requiring 5 L oxygen.  CT angiogram of the chest was negative for anything acute.  ABG shows pH of 7.31 PCO2 of 62.2 PO2 188 on 40% FiO2.  Urine drug screen is positive for barbiturates.  On exam Alan Brown is having some tremors of the right upper extremity has become more lethargic.  Was given Narcan twice.  With minimal response.  Alan Brown admitted for acute encephalopathy likely from polypharmacy.  Alan Brown is afebrile.  Pupils are reacting to light.  Labs also show creatinine 1.4 which is acute renal failure.  UA is pending.  Ammonia levels are normal.  Hemoglobin 12.4 platelets 148.  Review of Systems: As per HPI, rest all negative.   Past Medical History:  Diagnosis Date   Anxiety    Arthritis    "all through my body" (01/26/2014)   Bipolar 1 disorder (HCC)    Chronic lower back pain    Depression    Depression    GERD (gastroesophageal reflux disease)    Pneumonia 1952; 11/2013   Prolapse, disk    lower back    Past Surgical History:  Procedure Laterality Date   APPENDECTOMY  1961   INGUINAL HERNIA REPAIR Left 1998   TONSILLECTOMY  1976      reports that he has quit smoking. His smoking use included cigarettes. He has a 25.00 pack-year smoking history. He has never used smokeless tobacco. He reports that he does not drink alcohol or use drugs.  Allergies  Allergen Reactions   Naproxen Other (See Comments)    Increases lithium toxicity.    Gabapentin    Morphine And Related     Family History  Problem Relation Age of Onset   Hypertension Mother    Hypertension Father    Aneurysm Father    Other Sister        killed   Diabetes Sister    Heart disease Sister     Prior to Admission medications   Medication Sig Start Date End Date Taking? Authorizing Provider  aspirin EC 81 MG tablet Take 81 mg by mouth daily.    [provider]  busPIRone (BUSPAR) 5 MG tablet Take 5 mg by mouth 2 (two) times daily. 09/05/17   [provider]  Calcium Carb-Cholecalciferol (CALCIUM 600 + D PO) Take 1 tablet by mouth 2 (two) times daily.    [provider]  diclofenac sodium (VOLTAREN) 1 % GEL Apply topically 3 (three) times daily.    [provider]  Divalproex Sodium (DEPAKOTE PO) Take by mouth at bedtime.    [provider]  DULoxetine (CYMBALTA) 20 MG capsule Take 40 mg by mouth at bedtime.  [provider]  HYDROcodone-acetaminophen (NORCO/VICODIN) 5-325 MG tablet Take 1 tablet by mouth 3 (three) times daily.    [provider]  methocarbamol (ROBAXIN) 500 MG tablet Take 1,000 mg by mouth 2 (two) times daily.    [provider]  MORPHINE SULFATE PO Take by mouth as needed.    [provider]  propranolol ER (INDERAL LA) 60 MG 24 hr capsule Take 60 mg by mouth daily.    [provider]  ranitidine (ZANTAC) 150 MG tablet Take 150 mg by mouth 2 (two) times daily.    [provider]  sildenafil (VIAGRA) 100 MG tablet Take 100 mg by mouth daily. 09/12/17   [provider]  tamsulosin (FLOMAX) 0.4 MG CAPS capsule Take 0.4 mg by  mouth.    [provider]  traMADol (ULTRAM) 50 MG tablet Take 1 tablet by mouth 4 (four) times daily. 09/22/17   [provider]  traZODone (DESYREL) 50 MG tablet Take 50 mg by mouth at bedtime as needed for sleep.    [provider]    Physical Exam: Vitals:   04/22/19 2315 04/22/19 2330 04/23/19 0000 04/23/19 0015  BP: 106/87 104/85 131/76 109/64  Pulse: 64 64 65 67  Resp: (!) 21 19 16 17   Temp:      TempSrc:      SpO2: 93% 97% 97% 94%      Constitutional: Moderately built and nourished. Vitals:   04/22/19 2315 04/22/19 2330 04/23/19 0000 04/23/19 0015  BP: 106/87 104/85 131/76 109/64  Pulse: 64 64 65 67  Resp: (!) 21 19 16 17   Temp:      TempSrc:      SpO2: 93% 97% 97% 94%   Eyes: Anicteric no pallor. ENMT: No discharge from the ears eyes nose and mouth. Neck: No mass felt.  No neck rigidity. Respiratory: No rhonchi or crepitations. Cardiovascular: S1-S2 heard. Abdomen: Soft nontender bowel sounds present. Musculoskeletal: No edema.  No joint effusion. Skin: No rash. Neurologic: Alan Brown is drowsy responding to his name otherwise not following commands.  Pupils are equal and reacting to light.  No obvious facial asymmetry. Psychiatric: Alan Brown is lethargic.   Labs on Admission: I have personally reviewed following labs and imaging studies  CBC: Recent Labs  Lab 04/22/19 2114 04/22/19 2211  WBC 7.4  --   NEUTROABS 4.1  --   HGB 12.4* 11.2*  HCT 38.8* 33.0*  MCV 95.1  --   PLT 148*  --    Basic Metabolic Panel: Recent Labs  Lab 04/22/19 2114 04/22/19 2211  NA 140 140  K 4.4 3.8  CL 100  --   CO2 29  --   GLUCOSE 134*  --   BUN 15  --   CREATININE 1.43*  --   CALCIUM 8.7*  --    GFR: CrCl cannot be calculated (Unknown ideal weight.). Liver Function Tests: Recent Labs  Lab 04/22/19 2114  AST 37  ALT 14  ALKPHOS 39  BILITOT 0.4  PROT 6.0*  ALBUMIN 3.2*   Recent Labs  Lab 04/22/19 2114  LIPASE 22   Recent  Labs  Lab 04/22/19 2125  AMMONIA 20   Coagulation Profile: Recent Labs  Lab 04/22/19 2114  INR 1.1   Cardiac Enzymes: Recent Labs  Lab 04/22/19 2114  TROPONINI <0.03   BNP (last 3 results) No results for input(s): PROBNP in the last 8760 hours. HbA1C: No results for input(s): HGBA1C in the last 72 hours. CBG: No  results for input(s): GLUCAP in the last 168 hours. Lipid Profile: No results for input(s): CHOL, HDL, LDLCALC, TRIG, CHOLHDL, LDLDIRECT in the last 72 hours. Thyroid Function Tests: Recent Labs    04/22/19 2116  TSH 0.451   Anemia Panel: No results for input(s): VITAMINB12, FOLATE, FERRITIN, TIBC, IRON, RETICCTPCT in the last 72 hours. Urine analysis:    Component Value Date/Time   COLORURINE YELLOW 03/31/2019 0105   APPEARANCEUR CLEAR 03/31/2019 0105   LABSPEC 1.021 03/31/2019 0105   PHURINE 6.0 03/31/2019 0105   GLUCOSEU NEGATIVE 03/31/2019 0105   HGBUR NEGATIVE 03/31/2019 0105   BILIRUBINUR NEGATIVE 03/31/2019 0105   KETONESUR NEGATIVE 03/31/2019 0105   PROTEINUR NEGATIVE 03/31/2019 0105   UROBILINOGEN 0.2 01/23/2014 1505   NITRITE NEGATIVE 03/31/2019 0105   LEUKOCYTESUR NEGATIVE 03/31/2019 0105   Sepsis Labs: (procalcitonin:4,lacticidven:4) ) Recent Results (from the past 240 hour(s))  SARS Coronavirus 2 (CEPHEID- Performed in [facility] hospital lab), Hosp Order     Status: None   Collection Time: 04/22/19  9:31 PM  Result Value Ref Range Status   SARS Coronavirus 2 NEGATIVE NEGATIVE Final    Comment: (NOTE) If result is NEGATIVE SARS-CoV-2 target nucleic acids are NOT DETECTED. The SARS-CoV-2 RNA is generally detectable in upper and lower  respiratory specimens during the acute phase of infection. The lowest  concentration of SARS-CoV-2 viral copies this assay can detect is 250  copies / mL. A negative result does not preclude SARS-CoV-2 infection  and should not be used as the sole basis for treatment or other  Alan Brown  management decisions.  A negative result may occur with  improper specimen collection / handling, submission of specimen other  than nasopharyngeal swab, presence of viral mutation(s) within the  areas targeted by this assay, and inadequate number of viral copies  (<250 copies / mL). A negative result must be combined with clinical  observations, Alan Brown history, and epidemiological information. If result is POSITIVE SARS-CoV-2 target nucleic acids are DETECTED. The SARS-CoV-2 RNA is generally detectable in upper and lower  respiratory specimens dur ing the acute phase of infection.  Positive  results are indicative of active infection with SARS-CoV-2.  Clinical  correlation with Alan Brown history and other diagnostic information is  necessary to determine Alan Brown infection status.  Positive results do  not rule out bacterial infection or co-infection with other viruses. If result is PRESUMPTIVE POSTIVE SARS-CoV-2 nucleic acids MAY BE PRESENT.   A presumptive positive result was obtained on the submitted specimen  and confirmed on repeat testing.  While 2019 novel coronavirus  (SARS-CoV-2) nucleic acids may be present in the submitted sample  additional confirmatory testing may be necessary for epidemiological  and / or clinical management purposes  to differentiate between  SARS-CoV-2 and other Sarbecovirus currently known to infect humans.  If clinically indicated additional testing with an alternate test  methodology [phone]) is advised. The SARS-CoV-2 RNA is generally  detectable in upper and lower respiratory sp ecimens during the acute  phase of infection. The expected result is Negative. Fact Sheet for Patients:  BoilerBrush.com.cy Fact Sheet for Healthcare Providers: https://pope.com/ This test is not yet approved or cleared by the [place] FDA and has been authorized for detection and/or diagnosis of SARS-CoV-2 by FDA under  an Emergency Use Authorization (EUA).  This EUA will remain in effect (meaning this test can be used) for the duration of the COVID-19 declaration under Section 564(b)(1) of the Act, 21 U.S.C. section 360bbb-3(b)(1), unless the authorization is  terminated or revoked sooner. Performed at [facility], [address] [place], [place] [address]      Radiological Exams on Admission: Ct Head Wo Contrast  Result Date: 04/22/2019 CLINICAL DATA:  Altered level of consciousness EXAM: CT HEAD WITHOUT CONTRAST TECHNIQUE: Contiguous axial images were obtained from the base of the skull through the vertex without intravenous contrast. COMPARISON:  01/23/2014 FINDINGS: Brain: No evidence of acute infarction, hemorrhage, hydrocephalus, extra-axial collection or mass lesion/mass effect. Vascular: No hyperdense vessel or unexpected calcification. Skull: Normal. Negative for fracture or focal lesion. Sinuses/Orbits: No acute finding. Other: None. IMPRESSION: No acute intracranial abnormality noted. Electronically Signed   By: Alcide CleverMark  Lukens M.D.   On: 04/22/2019 22:09   Ct Angio Chest Pe W And/or Wo Contrast  Result Date: 04/23/2019 CLINICAL DATA:  68 year old male with liver G. Concern for pulmonary embolism. EXAM: CT ANGIOGRAPHY CHEST WITH CONTRAST TECHNIQUE: Multidetector CT imaging of the chest was performed using the standard protocol during bolus administration of intravenous contrast. Multiplanar CT image reconstructions and MIPs were obtained to evaluate the vascular anatomy. CONTRAST:  100mL OMNIPAQUE IOHEXOL 350 MG/ML SOLN COMPARISON:  Chest radiograph dated 04/22/2019 FINDINGS: Cardiovascular: Mild distention of the right atrium. There is no pericardial effusion. Coronary vascular calcification involving the LAD. There is mild atherosclerotic calcification of the aortic arch. No CT evidence of pulmonary embolism. Mediastinum/Nodes: Mildly enlarged right hilar lymph node measures 16 mm. No mediastinal  adenopathy. The esophagus and the thyroid gland are grossly unremarkable. No mediastinal fluid collection. Lungs/Pleura: There are bilateral lower lobe linear atelectasis/scarring. Mild lower lobe bronchiectatic changes noted. There is no focal consolidation, pleural effusion, or pneumothorax. The central airways are patent. Mucus secretions noted within the trachea. Upper Abdomen: No acute abnormality. Musculoskeletal: No chest wall abnormality. No acute or significant osseous findings. Review of the MIP images confirms the above findings. IMPRESSION: No acute intrathoracic pathology. No CT evidence of pulmonary embolism. Electronically Signed   By: Elgie CollardArash  Radparvar M.D.   On: 04/23/2019 00:22   Dg Chest Portable 1 View  Result Date: 04/22/2019 CLINICAL DATA:  68 year old male with altered mental status. EXAM: PORTABLE CHEST 1 VIEW COMPARISON:  Chest radiograph dated 03/30/2019 FINDINGS: There is shallow inspiration with bibasilar atelectasis. Mild chronic bronchitic changes. Mild eventration of the right hemidiaphragm. No focal consolidation, large pleural effusion, or pneumothorax. The cardiac silhouette is within normal limits. No acute osseous pathology. IMPRESSION: No active disease. Electronically Signed   By: Elgie CollardArash  Radparvar M.D.   On: 04/22/2019 21:32    EKG: Independently reviewed.  Normal sinus rhythm.  Assessment/Plan Principal Problem:   Acute encephalopathy Active Problems:   Bipolar 1 disorder (HCC)   Depression   Polysubstance (including opioids) dependence, daily use (HCC)   Chronic pain syndrome   Acute respiratory failure with hypoxia (HCC)    1. Acute encephalopathy -suspect polypharmacy as a cause in the setting of acute renal failure Alan Brown also has hypercapnia which improved with BiPAP.  Since Alan Brown remains lethargic with some abnormal tremors of the extremities I have consulted neurology.  Check EEG MRI brain.  Hold all medications for now.  Keep Alan Brown n.p.o.  Narcan  had minimal response. 2. Acute renal failure gently hydrate follow metabolic panel UA is pending. 3. Acute hypoxic respiratory failure CT angiogram of the chest is unremarkable.  Closely monitor respiratory status.  On BiPAP now. 4. History of depression and chronic pain and tremors presently n.p.o. 5. Normocytic normochromic anemia and thrombocytopenia appears to be new.  Follow CBC with differential closely.  Check anemia panel.  If there is further decline in platelets then may need further work-up.   DVT prophylaxis: SCDs in anticipation of possible procedure. Code Status: Full code. Family Communication: No family at the bedside. Disposition Plan: To be determined. Consults called: Neurologist. Admission status: Observation.   Eduard Clos MD [facility] Pager [phone].  If 7PM-7AM, please contact night-coverage [email or web address] Password [email or web address]  04/23/2019, 12:43 AM

## 2019-04-23 NOTE — ED Notes (Signed)
Placed call to admitting MD after pt was difficult to arouse per sternal rub. Orders placed and MD to see at bedside

## 2019-04-23 NOTE — ED Notes (Signed)
ED TO INPATIENT HANDOFF REPORT  ED Nurse Name and Phone #: [phone] Donetta PottsSarah  S Name/Age/Gender Alan MauJames Richard Brown 68 y.o. male Room/Bed: 019C/019C  Code Status   Code Status: Prior  Home/SNF/Other Home Patient oriented to: self and place Is this baseline? No   Triage Complete: Triage complete  Chief Complaint Stroke Symptoms    Triage Note Pt coming by EMS after reporting by family that pt has been lethargic and having diff. ambulating since Wednesday at 1600. Symptoms got better per family on Thursday but then symptoms returned Friday morning 0300. Hx of stroke and TIA. 8% on RA, pt placed on nonrebreather. Left and right leg deficit noted by family and EMS. Pt hypotensive with EMS with most recent BP of 112/46.   Allergies Allergies  Allergen Reactions  . Naproxen Other (See Comments)    Increases lithium toxicity.   . Gabapentin   . Morphine And Related     Level of Care/Admitting Diagnosis ED Disposition    ED Disposition Condition Comment   Admit  [facility] [[id number]]  Level of Care: Progressive [102]  I expect the patient will be discharged within 24 hours: No (not a candidate for 5C-Observation unit)  Covid Evaluation: N/A  Diagnosis: Acute encephalopathy [[id number]][[id number]]  Admitting Physician: Eduard ClosKAKRAKANDY, ARSHAD N [id number]]  Attending Physician: Eduard ClosKAKRAKANDY, ARSHAD N [[id number]]  PT Class (Do Not Modify): Observation [104]  PT Acc Code (Do Not Modify): Observation [10022]       B Medical/Surgery History Past Medical History:  Diagnosis Date  . Anxiety   . Arthritis    "all through my body" (01/26/2014)  . Bipolar 1 disorder (HCC)   . Chronic lower back pain   . Depression   . Depression   . GERD (gastroesophageal reflux disease)   . Pneumonia 1952; 11/2013  . Prolapse, disk    lower back   Past Surgical History:  Procedure Laterality Date  . APPENDECTOMY  1961  . INGUINAL HERNIA REPAIR Left 1998  . TONSILLECTOMY  1976      A IV Location/Drains/Wounds Patient Lines/Drains/Airways Status   Active Line/Drains/Airways    Name:   Placement date:   Placement time:   Site:   Days:   Peripheral IV 04/22/19 Left Antecubital   04/22/19    -    Antecubital   1          Intake/Output Last 24 hours No intake or output data in the 24 hours ending 04/23/19 0033  Labs/Imaging Results for orders placed or performed during the hospital encounter of 04/22/19 (from the past 48 hour(s))  CBC with Differential     Status: Abnormal   Collection Time: 04/22/19  9:14 PM  Result Value Ref Range   WBC 7.4 4.0 - 10.5 K/uL   RBC 4.08 (L) 4.22 - 5.81 MIL/uL   Hemoglobin 12.4 (L) 13.0 - 17.0 g/dL   HCT 08.638.8 (L) 57.839.0 - 46.952.0 %   MCV 95.1 80.0 - 100.0 fL   MCH 30.4 26.0 - 34.0 pg   MCHC 32.0 30.0 - 36.0 g/dL   RDW 62.913.9 52.811.5 - 41.315.5 %   Platelets 148 (L) 150 - 400 K/uL   nRBC 0.0 0.0 - 0.2 %   Neutrophils Relative % 56 %   Neutro Abs 4.1 1.7 - 7.7 K/uL   Lymphocytes Relative 27 %   Lymphs Abs 2.0 0.7 - 4.0 K/uL   Monocytes Relative 12 %   Monocytes Absolute 0.9 0.1 - 1.0 K/uL  Eosinophils Relative 4 %   Eosinophils Absolute 0.3 0.0 - 0.5 K/uL   Basophils Relative 0 %   Basophils Absolute 0.0 0.0 - 0.1 K/uL   Immature Granulocytes 1 %   Abs Immature Granulocytes 0.05 0.00 - 0.07 K/uL    Comment: Performed at [facility], [address], [place], [place] [address]  Comprehensive metabolic panel     Status: Abnormal   Collection Time: 04/22/19  9:14 PM  Result Value Ref Range   Sodium 140 135 - 145 mmol/L   Potassium 4.4 3.5 - 5.1 mmol/L   Chloride 100 98 - 111 mmol/L   CO2 29 22 - 32 mmol/L   Glucose, Bld 134 (H) 70 - 99 mg/dL   BUN 15 8 - 23 mg/dL   Creatinine, Ser 8.56 (H) 0.61 - 1.24 mg/dL   Calcium 8.7 (L) 8.9 - 10.3 mg/dL   Total Protein 6.0 (L) 6.5 - 8.1 g/dL   Albumin 3.2 (L) 3.5 - 5.0 g/dL   AST 37 15 - 41 U/L   ALT 14 0 - 44 U/L   Alkaline Phosphatase 39 38 - 126 U/L   Total Bilirubin 0.4 0.3 -  1.2 mg/dL   GFR calc non Af Amer 50 (L) >60 mL/min   GFR calc Af Amer 58 (L) >60 mL/min   Anion gap 11 5 - 15    Comment: Performed at [facility], [address] [place], [place] [address]  Lipase, blood     Status: None   Collection Time: 04/22/19  9:14 PM  Result Value Ref Range   Lipase 22 11 - 51 U/L    Comment: Performed at [facility], [address], [place], [place] [address]  Protime-INR     Status: None   Collection Time: 04/22/19  9:14 PM  Result Value Ref Range   Prothrombin Time 14.1 11.4 - 15.2 seconds   INR 1.1 0.8 - 1.2    Comment: (NOTE) INR goal varies based on device and disease states. Performed at [facility], [address], [place], [place] [address]   Troponin I - ONCE - STAT     Status: None   Collection Time: 04/22/19  9:14 PM  Result Value Ref Range   Troponin I <0.03 <0.03 ng/mL    Comment: Performed at [facility], [address], [place], [place] [address]  Ethanol     Status: None   Collection Time: 04/22/19  9:16 PM  Result Value Ref Range   Alcohol, Ethyl (B) <10 <10 mg/dL    Comment: (NOTE) Lowest detectable limit for serum alcohol is 10 mg/dL. For medical purposes only. Performed at [facility], [address], [place], [place] [address]   TSH     Status: None   Collection Time: 04/22/19  9:16 PM  Result Value Ref Range   TSH 0.451 0.350 - 4.500 uIU/mL    Comment: Performed by a 3rd Generation assay with a functional sensitivity of <=0.01 uIU/mL. Performed at [facility], [address] [place], [place] [address]   Acetaminophen level     Status: Abnormal   Collection Time: 04/22/19  9:16 PM  Result Value Ref Range   Acetaminophen (Tylenol), Serum <10 (L) 10 - 30 ug/mL    Comment: (NOTE) Therapeutic concentrations vary significantly. A range of 10-30 ug/mL  may be an effective concentration for many patients. However, some  are best treated at concentrations  outside of this  range. Acetaminophen concentrations >150 ug/mL at 4 hours after ingestion  and >50 ug/mL at 12 hours after ingestion are often associated with  toxic reactions. Performed at [facility], [address] [place], [place] [address]   Salicylate level     Status: None   Collection Time: 04/22/19  9:16 PM  Result Value Ref Range   Salicylate Lvl <7.0 2.8 - 30.0 mg/dL    Comment: Performed at [facility], [address], [place], [place] [address]  Lactic acid, plasma     Status: None   Collection Time: 04/22/19  9:19 PM  Result Value Ref Range   Lactic Acid, Venous 1.1 0.5 - 1.9 mmol/L    Comment: Performed at [facility] Lab, [address] [place], [place] [address]  Ammonia     Status: None   Collection Time: 04/22/19  9:25 PM  Result Value Ref Range   Ammonia 20 9 - 35 umol/L    Comment: Performed at [facility], [address] [place], [place] [address]  SARS Coronavirus 2 (CEPHEID- Performed in [facility] hospital lab), Hosp Order     Status: None   Collection Time: 04/22/19  9:31 PM  Result Value Ref Range   SARS Coronavirus 2 NEGATIVE NEGATIVE    Comment: (NOTE) If result is NEGATIVE SARS-CoV-2 target nucleic acids are NOT DETECTED. The SARS-CoV-2 RNA is generally detectable in upper and lower  respiratory specimens during the acute phase of infection. The lowest  concentration of SARS-CoV-2 viral copies this assay can detect is 250  copies / mL. A negative result does not preclude SARS-CoV-2 infection  and should not be used as the sole basis for treatment or other  patient management decisions.  A negative result may occur with  improper specimen collection / handling, submission of specimen other  than nasopharyngeal swab, presence of viral mutation(s) within the  areas targeted by this assay, and inadequate number of viral copies  (<250 copies / mL). A negative result must be combined with clinical  observations, patient history, and  epidemiological information. If result is POSITIVE SARS-CoV-2 target nucleic acids are DETECTED. The SARS-CoV-2 RNA is generally detectable in upper and lower  respiratory specimens dur ing the acute phase of infection.  Positive  results are indicative of active infection with SARS-CoV-2.  Clinical  correlation with patient history and other diagnostic information is  necessary to determine patient infection status.  Positive results do  not rule out bacterial infection or co-infection with other viruses. If result is PRESUMPTIVE POSTIVE SARS-CoV-2 nucleic acids MAY BE PRESENT.   A presumptive positive result was obtained on the submitted specimen  and confirmed on repeat testing.  While 2019 novel coronavirus  (SARS-CoV-2) nucleic acids may be present in the submitted sample  additional confirmatory testing may be necessary for epidemiological  and / or clinical management purposes  to differentiate between  SARS-CoV-2 and other Sarbecovirus currently known to infect humans.  If clinically indicated additional testing with an alternate test  methodology [phone]) is advised. The SARS-CoV-2 RNA is generally  detectable in upper and lower respiratory sp ecimens during the acute  phase of infection. The expected result is Negative. Fact Sheet for Patients:  BoilerBrush.com.cy Fact Sheet for Healthcare Providers: https://pope.com/ This test is not yet approved or cleared by the [place] FDA and has been authorized for detection and/or diagnosis of SARS-CoV-2 by FDA under an Emergency Use Authorization (EUA).  This EUA will remain in effect (meaning this test can be used) for the duration of the COVID-19 declaration under Section 564(b)(1) of the Act, 21 U.S.C. section 360bbb-3(b)(1), unless the authorization is terminated or revoked sooner. Performed at [facility], [address] [place], [place] [address]   I-STAT 7,  (LYTES, BLD GAS, ICA, H+H)     Status: Abnormal   Collection Time: 04/22/19 10:11 PM  Result Value Ref Range   pH, Arterial 7.312 (L) 7.350 - 7.450   pCO2 arterial 62.2 (H) 32.0 - 48.0 mmHg   pO2, Arterial 188.0 (H) 83.0 - 108.0 mmHg   Bicarbonate 31.7 (H) 20.0 - 28.0 mmol/L   TCO2 34 (H) 22 - 32 mmol/L   O2 Saturation 100.0 %   Acid-Base Excess 4.0 (H) 0.0 - 2.0 mmol/L   Sodium 140 135 - 145 mmol/L   Potassium 3.8 3.5 - 5.1 mmol/L   Calcium, Ion 1.26 1.15 - 1.40 mmol/L   HCT 33.0 (L) 39.0 - 52.0 %   Hemoglobin 11.2 (L) 13.0 - 17.0 g/dL   Patient temperature 60.4 F    Sample type ARTERIAL   Lactic acid, plasma     Status: None   Collection Time: 04/23/19 12:00 AM  Result Value Ref Range   Lactic Acid, Venous 1.8 0.5 - 1.9 mmol/L    Comment: Performed at [facility], [address] [place], [place] [address]   Ct Head Wo Contrast  Result Date: 04/22/2019 CLINICAL DATA:  Altered level of consciousness EXAM: CT HEAD WITHOUT CONTRAST TECHNIQUE: Contiguous axial images were obtained from the base of the skull through the vertex without intravenous contrast. COMPARISON:  01/23/2014 FINDINGS: Brain: No evidence of acute infarction, hemorrhage, hydrocephalus, extra-axial collection or mass lesion/mass effect. Vascular: No hyperdense vessel or unexpected calcification. Skull: Normal. Negative for fracture or focal lesion. Sinuses/Orbits: No acute finding. Other: None. IMPRESSION: No acute intracranial abnormality noted. Electronically Signed   By: Alcide Clever M.D.   On: 04/22/2019 22:09   Ct Angio Chest Pe W And/or Wo Contrast  Result Date: 04/23/2019 CLINICAL DATA:  68 year old male with liver G. Concern for pulmonary embolism. EXAM: CT ANGIOGRAPHY CHEST WITH CONTRAST TECHNIQUE: Multidetector CT imaging of the chest was performed using the standard protocol during bolus administration of intravenous contrast. Multiplanar CT image reconstructions and MIPs were obtained to evaluate the  vascular anatomy. CONTRAST:  OMNIPAQUE IOHEXOL 350 MG/ML SOLN COMPARISON:  Chest radiograph dated 04/22/2019 FINDINGS: Cardiovascular: Mild distention of the right atrium. There is no pericardial effusion. Coronary vascular calcification involving the LAD. There is mild atherosclerotic calcification of the aortic arch. No CT evidence of pulmonary embolism. Mediastinum/Nodes: Mildly enlarged right hilar lymph node measures 16 mm. No mediastinal adenopathy. The esophagus and the thyroid gland are grossly unremarkable. No mediastinal fluid collection. Lungs/Pleura: There are bilateral lower lobe linear atelectasis/scarring. Mild lower lobe bronchiectatic changes noted. There is no focal consolidation, pleural effusion, or pneumothorax. The central airways are patent. Mucus secretions noted within the trachea. Upper Abdomen: No acute abnormality. Musculoskeletal: No chest wall abnormality. No acute or significant osseous findings. Review of the MIP images confirms the above findings. IMPRESSION: No acute intrathoracic pathology. No CT evidence of pulmonary embolism. Electronically Signed   By: Elgie Collard M.D.   On: 04/23/2019 00:22   Dg Chest Portable 1 View  Result Date: 04/22/2019 CLINICAL DATA:  68 year old male with altered mental status. EXAM: PORTABLE CHEST 1 VIEW COMPARISON:  Chest radiograph dated 03/30/2019 FINDINGS:  There is shallow inspiration with bibasilar atelectasis. Mild chronic bronchitic changes. Mild eventration of the right hemidiaphragm. No focal consolidation, large pleural effusion, or pneumothorax. The cardiac silhouette is within normal limits. No acute osseous pathology. IMPRESSION: No active disease. Electronically Signed   By: Elgie Collard M.D.   On: 04/22/2019 21:32    Pending Labs Unresulted Labs (From admission, onward)    Start     Ordered   04/22/19 2345  Valproic acid level  ONCE - STAT,   R     04/22/19 2344   04/22/19 2125  Blood gas, arterial (at [facility])   ONCE - STAT,   STAT     04/22/19 2124   04/22/19 2117  Urine culture  ONCE - STAT,   STAT     04/22/19 2116   04/22/19 2117  Blood culture (routine x 2)  BLOOD CULTURE X 2,   STAT     04/22/19 2116   04/22/19 2115  Rapid urine drug screen (hospital performed)  ONCE - STAT,   R     04/22/19 2116          Vitals/Pain Today's Vitals   04/22/19 2315 04/22/19 2330 04/23/19 0000 04/23/19 0015  BP: 106/87 104/85 131/76 109/64  Pulse: 64 64 65 67  Resp: (!) Temp:      TempSrc:      SpO2: 93% 97% 97% 94%    Isolation Precautions No active isolations  Medications Medications  naloxone (NARCAN) injection 1 mg (has no administration in time range)  naloxone [facility]) injection 0.4 mg (0.4 mg Intravenous Given 04/22/19 2223)  lactated ringers bolus 1,000 mL (0 mLs Intravenous Stopped 04/22/19 2245)  iohexol (OMNIPAQUE) 350 MG/ML injection 100 mL (100 mLs Intravenous Contrast Given 04/23/19 0007)    Mobility non-ambulatory High fall risk   Focused Assessments Neuro Assessment Handoff:  Swallow screen pass? N/A         Neuro Assessment:   Neuro Checks:      Last Documented NIHSS Modified Score:   Has TPA been given? No If patient is a Neuro Trauma and patient is going to OR before floor call report to 4N Charge nurse: [phone] or [phone]     R Recommendations: See Admitting Provider Note  Report given to:   Additional Notes: N/A

## 2019-04-23 NOTE — Progress Notes (Signed)
EEG Complete  Results Pending 

## 2019-04-24 ENCOUNTER — Observation Stay (HOSPITAL_COMMUNITY): Payer: No Typology Code available for payment source

## 2019-04-24 DIAGNOSIS — F3130 Bipolar disorder, current episode depressed, mild or moderate severity, unspecified: Secondary | ICD-10-CM | POA: Diagnosis not present

## 2019-04-24 DIAGNOSIS — Z8249 Family history of ischemic heart disease and other diseases of the circulatory system: Secondary | ICD-10-CM | POA: Diagnosis not present

## 2019-04-24 DIAGNOSIS — T50915A Adverse effect of multiple unspecified drugs, medicaments and biological substances, initial encounter: Secondary | ICD-10-CM | POA: Diagnosis present

## 2019-04-24 DIAGNOSIS — J9602 Acute respiratory failure with hypercapnia: Secondary | ICD-10-CM | POA: Diagnosis present

## 2019-04-24 DIAGNOSIS — G894 Chronic pain syndrome: Secondary | ICD-10-CM | POA: Diagnosis present

## 2019-04-24 DIAGNOSIS — Z7982 Long term (current) use of aspirin: Secondary | ICD-10-CM | POA: Diagnosis not present

## 2019-04-24 DIAGNOSIS — F419 Anxiety disorder, unspecified: Secondary | ICD-10-CM | POA: Diagnosis present

## 2019-04-24 DIAGNOSIS — M21372 Foot drop, left foot: Secondary | ICD-10-CM | POA: Diagnosis present

## 2019-04-24 DIAGNOSIS — Z20828 Contact with and (suspected) exposure to other viral communicable diseases: Secondary | ICD-10-CM | POA: Diagnosis present

## 2019-04-24 DIAGNOSIS — J9601 Acute respiratory failure with hypoxia: Secondary | ICD-10-CM | POA: Diagnosis present

## 2019-04-24 DIAGNOSIS — F1120 Opioid dependence, uncomplicated: Secondary | ICD-10-CM | POA: Diagnosis present

## 2019-04-24 DIAGNOSIS — N179 Acute kidney failure, unspecified: Secondary | ICD-10-CM | POA: Diagnosis present

## 2019-04-24 DIAGNOSIS — G4700 Insomnia, unspecified: Secondary | ICD-10-CM | POA: Diagnosis present

## 2019-04-24 DIAGNOSIS — E876 Hypokalemia: Secondary | ICD-10-CM | POA: Diagnosis not present

## 2019-04-24 DIAGNOSIS — I10 Essential (primary) hypertension: Secondary | ICD-10-CM | POA: Diagnosis present

## 2019-04-24 DIAGNOSIS — M545 Low back pain: Secondary | ICD-10-CM | POA: Diagnosis present

## 2019-04-24 DIAGNOSIS — Y92009 Unspecified place in unspecified non-institutional (private) residence as the place of occurrence of the external cause: Secondary | ICD-10-CM | POA: Diagnosis not present

## 2019-04-24 DIAGNOSIS — Z87891 Personal history of nicotine dependence: Secondary | ICD-10-CM | POA: Diagnosis not present

## 2019-04-24 DIAGNOSIS — G92 Toxic encephalopathy: Secondary | ICD-10-CM | POA: Diagnosis present

## 2019-04-24 DIAGNOSIS — Z833 Family history of diabetes mellitus: Secondary | ICD-10-CM | POA: Diagnosis not present

## 2019-04-24 DIAGNOSIS — F319 Bipolar disorder, unspecified: Secondary | ICD-10-CM | POA: Diagnosis present

## 2019-04-24 DIAGNOSIS — J449 Chronic obstructive pulmonary disease, unspecified: Secondary | ICD-10-CM | POA: Diagnosis present

## 2019-04-24 DIAGNOSIS — R4182 Altered mental status, unspecified: Secondary | ICD-10-CM | POA: Diagnosis present

## 2019-04-24 DIAGNOSIS — G9340 Encephalopathy, unspecified: Secondary | ICD-10-CM | POA: Diagnosis not present

## 2019-04-24 LAB — URINE CULTURE: Culture: NO GROWTH

## 2019-04-24 LAB — GLUCOSE, CAPILLARY
Glucose-Capillary: 118 mg/dL — ABNORMAL HIGH (ref 70–99)
Glucose-Capillary: 120 mg/dL — ABNORMAL HIGH (ref 70–99)
Glucose-Capillary: 112 mg/dL — ABNORMAL HIGH (ref 70–99)
Glucose-Capillary: 114 mg/dL — ABNORMAL HIGH (ref 70–99)
Glucose-Capillary: 144 mg/dL — ABNORMAL HIGH (ref 70–99)

## 2019-04-24 LAB — HIV ANTIBODY (ROUTINE TESTING W REFLEX): HIV Screen 4th Generation wRfx: NONREACTIVE

## 2019-04-24 LAB — RPR: RPR Ser Ql: NONREACTIVE

## 2019-04-24 MED ORDER — ORAL CARE MOUTH RINSE
15.0000 mL | Freq: Two times a day (BID) | OROMUCOSAL | Status: DC
  Administered 2019-04-24 – 2019-04-27 (×6): 15 mL via OROMUCOSAL

## 2019-04-24 MED ORDER — HALOPERIDOL LACTATE 5 MG/ML IJ SOLN
2.5000 mg | Freq: Once | INTRAMUSCULAR | Status: AC
  Administered 2019-04-24: 21:00:00 2.5 mg via INTRAVENOUS
  Filled 2019-04-24: qty 1

## 2019-04-24 NOTE — Progress Notes (Signed)
An attempt for [facility] swallow eval no successful as  Pt was  not following direction and sleepy  Will conssult with [facility] l put for speech Therapist evaluation for diet .

## 2019-04-24 NOTE — Progress Notes (Signed)
Pt off unit for MRI via bed and oxygen 2 L [place]

## 2019-04-24 NOTE — Progress Notes (Signed)
PROGRESS NOTE    Alan Brown  ZOX:[id number] DOB: [birth year] DOA: 04/22/2019 PCP: Clovis Riley, L.August Saucer, MD   Brief Narrative:  Aldean Pipe Andersonis a 68 y.o.malewithhistory of chronic pain, depression and tremors was brought to the ER the patient's family noticed that patient was increasingly altered in mental status. Symptoms started 3 days ago which improved briefly but again became more pronounced yesterday and was brought to the ER for further evaluation.   He required BIPAP for 24 hours, and he is transitioned to [place] oxygen . He is more alert today and answering simple questions.  On talking to his wife, she reports this has happened many times.  Assessment & Plan:   Principal Problem:   Acute encephalopathy Active Problems:   Bipolar 1 disorder (HCC)   Depression   Polysubstance (including opioids) dependence, daily use (HCC)   Chronic pain syndrome   Acute respiratory failure with hypoxia (HCC)   Acute metabolic encephalopathy secondary to acute respiratory failure with hypercarbia Improving with BIPAP. HE is transitioned off BIPAP to [place] oxygen.  EEG negative. MRI brain negative.  Suspect polypharmacy.  CT angio of the chest negative for PE, pneumonia or effusions.    H/o Bipolar disorder, with polysubstance abuse, and chronic pain syndrome.  b12 tsh, RPR , is negative. Ammonia wnl. Psychiatry will be consulted in am to restart his medications slowly.    Nutrition: Pt NPO, due to lethargy.  SLP eval pending.  PT/OT evaluation ordered.       DVT prophylaxis: scd's Code Status: full code.  Family Communication: none at bedside. Discussed with wife over the phone.  Disposition Plan: pending clinical improvement.    Consultants:   Neurology.    Procedures: MRI brian.   Antimicrobials: none.   Subjective: Pt alert and answering to questions. He told me his name and his date of birth.   Objective: Vitals:   04/24/19 0756 04/24/19 0947  04/24/19 1106 04/24/19 1502  BP: (!) 143/78  (!) 152/87 (!) 151/77  Pulse: 60 66 66 (!) 58  Resp: (!) 24 (!) 22 (!) 23 20  Temp: 99 F (37.2 C)  98.4 F (36.9 C)   TempSrc: Axillary  Axillary   SpO2: 99% 96% 94% 94%  Weight:        Intake/Output Summary (Last 24 hours) at 04/24/2019 1648 Last data filed at 04/24/2019 1500 Gross per 24 hour  Intake 1000.97 ml  Output 2300 ml  Net -1299.03 ml   Filed Weights   04/23/19 0241 04/23/19 0455 04/24/19 0449  Weight: 101.9 kg 101.9 kg 100 kg    Examination:  General exam: Appears calm and comfortable  Respiratory system: Clear to auscultation. Respiratory effort normal. Cardiovascular system: S1 & S2 heard, RRR. No JVD, murmurs, rubs, gallops or clicks. No pedal edema. Gastrointestinal system: Abdomen is nondistended, soft and nontender. No organomegaly or masses felt. Normal bowel sounds heard. Central nervous system: Alert answering simple questions and following commands.  Extremities: Symmetric 5 x 5 power. Skin: No rashes, lesions or ulcers Psychiatry:  Calm. Not agitated.    Data Reviewed: I have personally reviewed following labs and imaging studies  CBC: Recent Labs  Lab 04/22/19 2114 04/22/19 2211 04/23/19 0121 04/23/19 1015  WBC 7.4  --   --  6.0  NEUTROABS 4.1  --   --  3.4  HGB 12.4* 11.2* 12.2* 12.3*  HCT 38.8* 33.0* 36.0* 37.9*  MCV 95.1  --   --  95.0  PLT 148*  --   --  153   Basic Metabolic Panel: Recent Labs  Lab 04/22/19 2114 04/22/19 2211 04/23/19 0121 04/23/19 2054  NA 140 140 139 137  K 4.4 3.8 4.0 4.2  CL 100  --   --  103  CO2 29  --   --  24  GLUCOSE 134*  --   --  128*  BUN 15  --   --  13  CREATININE 1.43*  --   --  1.11  CALCIUM 8.7*  --   --  8.7*   GFR: CrCl cannot be calculated (Unknown ideal weight.). Liver Function Tests: Recent Labs  Lab 04/22/19 2114 04/23/19 1015  AST 37 30  ALT 14 13  ALKPHOS 39 39  BILITOT 0.4 0.5  PROT 6.0* 6.0*  ALBUMIN 3.2* 3.0*   Recent  Labs  Lab 04/22/19 2114  LIPASE 22   Recent Labs  Lab 04/22/19 2125  AMMONIA 20   Coagulation Profile: Recent Labs  Lab 04/22/19 2114  INR 1.1   Cardiac Enzymes: Recent Labs  Lab 04/22/19 2114 04/23/19 0103 04/23/19 1015 04/23/19 2054  TROPONINI <0.03 <0.03 <0.03 <0.03   BNP (last 3 results) No results for input(s): PROBNP in the last 8760 hours. HbA1C: No results for input(s): HGBA1C in the last 72 hours. CBG: Recent Labs  Lab 04/23/19 2054 04/23/19 2313 04/24/19 0446 04/24/19 0754 04/24/19 1105  GLUCAP 120* 130* 118* 120* 112*   Lipid Profile: No results for input(s): CHOL, HDL, LDLCALC, TRIG, CHOLHDL, LDLDIRECT in the last 72 hours. Thyroid Function Tests: Recent Labs    04/22/19 2116  TSH 0.451   Anemia Panel: Recent Labs    04/23/19 1015  VITAMINB12 790  FOLATE 45.0  FERRITIN 34  TIBC 281  IRON 35*  RETICCTPCT 1.6   Sepsis Labs: Recent Labs  Lab 04/22/19 2119 04/23/19 0000  LATICACIDVEN 1.1 1.8    Recent Results (from the past 240 hour(s))  SARS Coronavirus 2 (CEPHEID- Performed in [facility] hospital lab), Hosp Order     Status: None   Collection Time: 04/22/19  9:31 PM  Result Value Ref Range Status   SARS Coronavirus 2 NEGATIVE NEGATIVE Final    Comment: (NOTE) If result is NEGATIVE SARS-CoV-2 target nucleic acids are NOT DETECTED. The SARS-CoV-2 RNA is generally detectable in upper and lower  respiratory specimens during the acute phase of infection. The lowest  concentration of SARS-CoV-2 viral copies this assay can detect is 250  copies / mL. A negative result does not preclude SARS-CoV-2 infection  and should not be used as the sole basis for treatment or other  patient management decisions.  A negative result may occur with  improper specimen collection / handling, submission of specimen other  than nasopharyngeal swab, presence of viral mutation(s) within the  areas targeted by this assay, and inadequate number of viral  copies  (<250 copies / mL). A negative result must be combined with clinical  observations, patient history, and epidemiological information. If result is POSITIVE SARS-CoV-2 target nucleic acids are DETECTED. The SARS-CoV-2 RNA is generally detectable in upper and lower  respiratory specimens dur ing the acute phase of infection.  Positive  results are indicative of active infection with SARS-CoV-2.  Clinical  correlation with patient history and other diagnostic information is  necessary to determine patient infection status.  Positive results do  not rule out bacterial infection or co-infection with other viruses. If result is PRESUMPTIVE POSTIVE SARS-CoV-2 nucleic acids MAY BE PRESENT.   A presumptive positive  result was obtained on the submitted specimen  and confirmed on repeat testing.  While 2019 novel coronavirus  (SARS-CoV-2) nucleic acids may be present in the submitted sample  additional confirmatory testing may be necessary for epidemiological  and / or clinical management purposes  to differentiate between  SARS-CoV-2 and other Sarbecovirus currently known to infect humans.  If clinically indicated additional testing with an alternate test  methodology [phone]) is advised. The SARS-CoV-2 RNA is generally  detectable in upper and lower respiratory sp ecimens during the acute  phase of infection. The expected result is Negative. Fact Sheet for Patients:  BoilerBrush.com.cy Fact Sheet for Healthcare Providers: https://pope.com/ This test is not yet approved or cleared by the [place] FDA and has been authorized for detection and/or diagnosis of SARS-CoV-2 by FDA under an Emergency Use Authorization (EUA).  This EUA will remain in effect (meaning this test can be used) for the duration of the COVID-19 declaration under Section 564(b)(1) of the Act, 21 U.S.C. section 360bbb-3(b)(1), unless the authorization is terminated  or revoked sooner. Performed at [facility], [address], [place], [place] [address]   Blood culture (routine x 2)     Status: None (Preliminary result)   Collection Time: 04/22/19 10:05 PM  Result Value Ref Range Status   Specimen Description BLOOD RIGHT ARM  Final   Special Requests   Final    BOTTLES DRAWN AEROBIC AND ANAEROBIC Blood Culture adequate volume   Culture   Final    NO GROWTH 2 DAYS Performed at [facility], [address] [place], [place] [address]    Report Status PENDING  Incomplete  Blood culture (routine x 2)     Status: None (Preliminary result)   Collection Time: 04/22/19 10:25 PM  Result Value Ref Range Status   Specimen Description BLOOD LEFT WRIST  Final   Special Requests   Final    BOTTLES DRAWN AEROBIC ONLY Blood Culture results may not be optimal due to an inadequate volume of blood received in culture bottles   Culture   Final    NO GROWTH 2 DAYS Performed at [facility], [address], [place], [place] [address]    Report Status PENDING  Incomplete  Urine culture     Status: None   Collection Time: 04/22/19 11:29 PM  Result Value Ref Range Status   Specimen Description URINE, CATHETERIZED  Final   Special Requests NONE  Final   Culture   Final    NO GROWTH Performed at [facility], [address], [place], [place] [address]    Report Status 04/24/2019 FINAL  Final         Radiology Studies: Ct Head Wo Contrast  Result Date: 04/22/2019 CLINICAL DATA:  Altered level of consciousness EXAM: CT HEAD WITHOUT CONTRAST TECHNIQUE: Contiguous axial images were obtained from the base of the skull through the vertex without intravenous contrast. COMPARISON:  01/23/2014 FINDINGS: Brain: No evidence of acute infarction, hemorrhage, hydrocephalus, extra-axial collection or mass lesion/mass effect. Vascular: No hyperdense vessel or unexpected calcification. Skull: Normal. Negative for fracture or focal lesion.  Sinuses/Orbits: No acute finding. Other: None. IMPRESSION: No acute intracranial abnormality noted. Electronically Signed   By: Alcide Clever M.D.   On: 04/22/2019 22:09   Ct Angio Chest Pe W And/or Wo Contrast  Result Date: 04/23/2019 CLINICAL DATA:  68 year old male with liver G. Concern for pulmonary embolism. EXAM: CT ANGIOGRAPHY CHEST WITH CONTRAST TECHNIQUE: Multidetector CT  imaging of the chest was performed using the standard protocol during bolus administration of intravenous contrast. Multiplanar CT image reconstructions and MIPs were obtained to evaluate the vascular anatomy. CONTRAST:  100mL OMNIPAQUE IOHEXOL 350 MG/ML SOLN COMPARISON:  Chest radiograph dated 04/22/2019 FINDINGS: Cardiovascular: Mild distention of the right atrium. There is no pericardial effusion. Coronary vascular calcification involving the LAD. There is mild atherosclerotic calcification of the aortic arch. No CT evidence of pulmonary embolism. Mediastinum/Nodes: Mildly enlarged right hilar lymph node measures 16 mm. No mediastinal adenopathy. The esophagus and the thyroid gland are grossly unremarkable. No mediastinal fluid collection. Lungs/Pleura: There are bilateral lower lobe linear atelectasis/scarring. Mild lower lobe bronchiectatic changes noted. There is no focal consolidation, pleural effusion, or pneumothorax. The central airways are patent. Mucus secretions noted within the trachea. Upper Abdomen: No acute abnormality. Musculoskeletal: No chest wall abnormality. No acute or significant osseous findings. Review of the MIP images confirms the above findings. IMPRESSION: No acute intrathoracic pathology. No CT evidence of pulmonary embolism. Electronically Signed   By: Elgie CollardArash  Radparvar M.D.   On: 04/23/2019 00:22   Mr Brain Wo Contrast  Result Date: 04/24/2019 CLINICAL DATA:  68 y/o M; altered mental status, lethargy, difficulty ambulating. EXAM: MRI HEAD WITHOUT CONTRAST TECHNIQUE: Multiplanar, multiecho pulse  sequences of the brain and surrounding structures were obtained without intravenous contrast. COMPARISON:  04/22/2019 CT head.  01/23/2014 MRI head. FINDINGS: Brain: No acute infarction, hemorrhage, hydrocephalus, extra-axial collection or mass lesion. Stable mild chronic microvascular ischemic changes and volume loss of the brain. Vascular: Normal flow voids. Skull and upper cervical spine: Normal marrow signal. Sinuses/Orbits: Negative. Other: None. IMPRESSION: 1. No acute intracranial abnormality identified. 2. Stable mild chronic microvascular ischemic changes and volume loss of the brain. Electronically Signed   By: Mitzi HansenLance  Furusawa-Stratton M.D.   On: 04/24/2019 01:30   Dg Chest Portable 1 View  Result Date: 04/22/2019 CLINICAL DATA:  68 year old male with altered mental status. EXAM: PORTABLE CHEST 1 VIEW COMPARISON:  Chest radiograph dated 03/30/2019 FINDINGS: There is shallow inspiration with bibasilar atelectasis. Mild chronic bronchitic changes. Mild eventration of the right hemidiaphragm. No focal consolidation, large pleural effusion, or pneumothorax. The cardiac silhouette is within normal limits. No acute osseous pathology. IMPRESSION: No active disease. Electronically Signed   By: Elgie CollardArash  Radparvar M.D.   On: 04/22/2019 21:32        Scheduled Meds:  mouth rinse  15 mL Mouth Rinse BID   Continuous Infusions:  dextrose 5 % and 0.9% NaCl 75 mL/hr at 04/24/19 1000     LOS: 0 days    Time spent: 38 minutes.     Kathlen ModyVijaya Elmira Olkowski, MD [facility] Pager [phone]  If 7PM-7AM, please contact night-coverage [email or web address] Password [email or web address] 04/24/2019, 4:48 PM

## 2019-04-24 NOTE — TOC Initial Note (Signed)
Transition of Care [facility]) - Initial/Assessment Note    Patient Details  Name: Alan Brown MRN: [id number] Date of Birth: Jan 01, 1951  Transition of Care [facility] CM/SW Contact:    Lawerance Sabal, RN Phone Number: 04/24/2019, 9:04 AM  Clinical Narrative:                 Sherron Monday w patient's wife who states that he is active at [facility], he gets his meds through there, but also has coverage for meds with his [facility] Medicare. She thinks his doctor is Adriana Simas, but is not sure. She will notify them of his admission tomorrow when they open.   Expected Discharge Plan: Home w Home Health Services Barriers to Discharge: Continued Medical Work up   Patient Goals and CMS Choice Patient states their goals for this hospitalization and ongoing recovery are:: to return home      Expected Discharge Plan and Services Expected Discharge Plan: Home w Home Health Services     Post Acute Care Choice: Home Health, Resumption of Svcs/PTA Provider                             HH Arranged: [facility]: [facility] Date HH Agency Contacted: 04/23/19 Time HH Agency Contacted: 1121 Representative spoke with at [facility] Agency: Barbara Cower notified CM that patient was active  Prior Living Arrangements/Services                       Activities of Daily Living      Permission Sought/Granted                  Emotional Assessment              Admission diagnosis:  Altered mental status, unspecified altered mental status type [R41.82] Acute encephalopathy [G93.40] Patient Active Problem List   Diagnosis Date Noted  . Acute respiratory failure with hypoxia (HCC) 04/23/2019  . Tear of right supraspinatus tendon 03/23/2019  . Arthrosis of right acromioclavicular joint 03/23/2019  . Depression 01/24/2014  . Polysubstance (including opioids) dependence, daily use (HCC) 01/24/2014  . Chronic pain syndrome 01/24/2014  . Sepsis (HCC) 01/23/2014  . Altered mental  status 01/23/2014  . Acute encephalopathy 11/19/2013  . SIRS (systemic inflammatory response syndrome) (HCC) 11/19/2013  . Community acquired pneumonia 11/19/2013  . GERD (gastroesophageal reflux disease) 11/19/2013  . Bipolar 1 disorder (HCC) 11/19/2013  . Lactic acidosis 11/19/2013  . Pneumonia 11/19/2013   PCP:  Clovis Riley, L.August Saucer, MD Pharmacy:   [facility] [phone] - [place], [place] - [address] AT [address] [facility] [address] [facility] [address] [place] [address] Phone: [phone] Fax: [phone]     Social Determinants of Health (SDOH) Interventions    Readmission Risk Interventions No flowsheet data found.

## 2019-04-24 NOTE — Progress Notes (Addendum)
NEURO HOSPITALIST PROGRESS NOTE   Subjective: Patient drowsy, but more alert than during initial consult. Able to follow some simple commands. NAD on 3L [place] 02.   Exam: Vitals:   04/24/19 0756 04/24/19 0947  BP: (!) 143/78   Pulse: 60 66  Resp: (!) 24 (!) 22  Temp: 99 F (37.2 C)   SpO2: 99% 96%    Physical Exam  HEENT-  Normocephalic, no lesions, without obvious abnormality.  Normal external eye and conjunctiva.   Cardiovascular- , pulses palpable throughout   Lungs- no excessive working breathing.  Saturations within normal limits on 3l O2 Extremities- Warm, dry and intact  Neuro:  Mental Status: Drowsy. Easily arouses to light touch and name calling. Keeps eyes closed. Was able to state name. Unable to state age/month or year. No aphasia noted.  Able to follow simple commands without difficulty. When asked to raise arms off of bed. " moaned uh uh", asked of he could lift his arms " again moaned uh uh"  Cranial Nerves: II:  Blinks to threat III,IV, VI: ptosis not present, able to track examiner around the room. PERRL. V,VII: face appears symmetric, facial light touch sensation normal bilaterally VIII: hearing normal bilaterally IX,X: uvula rises symmetrically XI: bilateral shoulder shrug XII: midline tongue extension Motor: Able to move all 4 extremities to command and spontaneously. Did not however break gravity with any extremity d/t lack of cooperation. Increased tone when trying to passively move extremities. Sensory: light touch intact throughout, bilaterally Cerebellar: UTA d/t lack of cooperation.  Gait: deferred    Medications:  Scheduled: . mouth rinse  15 mL Mouth Rinse BID   Continuous: . dextrose 5 % and 0.9% NaCl 75 mL/hr at 04/24/19 1000   NIO:[id number] **OR** acetaminophen, ondansetron **OR** ondansetron (ZOFRAN) IV  Pertinent Labs/Diagnostics:   Ct Head Wo Contrast  Result Date: 04/22/2019 CLINICAL DATA:  Altered level of  consciousness EXAM: CT HEAD WITHOUT CONTRAST TECHNIQUE: Contiguous axial images were obtained from the base of the skull through the vertex without intravenous contrast. COMPARISON:  01/23/2014 FINDINGS: Brain: No evidence of acute infarction, hemorrhage, hydrocephalus, extra-axial collection or mass lesion/mass effect. Vascular: No hyperdense vessel or unexpected calcification. Skull: Normal. Negative for fracture or focal lesion. Sinuses/Orbits: No acute finding. Other: None. IMPRESSION: No acute intracranial abnormality noted. Electronically Signed   By: Alcide Clever M.D.   On: 04/22/2019 22:09   Ct Angio Chest Pe W And/or Wo Contrast  Result Date: 04/23/2019 CLINICAL DATA:  68 year old male with liver G. Concern for pulmonary embolism. EXAM: CT ANGIOGRAPHY CHEST WITH CONTRAST TECHNIQUE: Multidetector CT imaging of the chest was performed using the standard protocol during bolus administration of intravenous contrast. Multiplanar CT image reconstructions and MIPs were obtained to evaluate the vascular anatomy. CONTRAST:  OMNIPAQUE IOHEXOL 350 MG/ML SOLN COMPARISON:  Chest radiograph dated 04/22/2019 FINDINGS: Cardiovascular: Mild distention of the right atrium. There is no pericardial effusion. Coronary vascular calcification involving the LAD. There is mild atherosclerotic calcification of the aortic arch. No CT evidence of pulmonary embolism. Mediastinum/Nodes: Mildly enlarged right hilar lymph node measures 16 mm. No mediastinal adenopathy. The esophagus and the thyroid gland are grossly unremarkable. No mediastinal fluid collection. Lungs/Pleura: There are bilateral lower lobe linear atelectasis/scarring. Mild lower lobe bronchiectatic changes noted. There is no focal consolidation, pleural effusion, or pneumothorax. The central airways are patent. Mucus secretions noted within the  trachea. Upper Abdomen: No acute abnormality. Musculoskeletal: No chest wall abnormality. No acute or significant  osseous findings. Review of the MIP images confirms the above findings. IMPRESSION: No acute intrathoracic pathology. No CT evidence of pulmonary embolism. Electronically Signed   By: Elgie CollardArash  Radparvar M.D.   On: 04/23/2019 00:22   Mr Brain Wo Contrast  Result Date: 04/24/2019 CLINICAL DATA:  68 y/o M; altered mental status, lethargy, difficulty ambulating. EXAM: MRI HEAD WITHOUT CONTRAST TECHNIQUE: Multiplanar, multiecho pulse sequences of the brain and surrounding structures were obtained without intravenous contrast. COMPARISON:  04/22/2019 CT head.  01/23/2014 MRI head. FINDINGS: Brain: No acute infarction, hemorrhage, hydrocephalus, extra-axial collection or mass lesion. Stable mild chronic microvascular ischemic changes and volume loss of the brain. Vascular: Normal flow voids. Skull and upper cervical spine: Normal marrow signal. Sinuses/Orbits: Negative. Other: None. IMPRESSION: 1. No acute intracranial abnormality identified. 2. Stable mild chronic microvascular ischemic changes and volume loss of the brain. Electronically Signed   By: Mitzi HansenLance  Furusawa-Stratton M.D.   On: 04/24/2019 01:30   Dg Chest Portable 1 View  Result Date: 04/22/2019 CLINICAL DATA:  68 year old male with altered mental status. EXAM: PORTABLE CHEST 1 VIEW COMPARISON:  Chest radiograph dated 03/30/2019 FINDINGS: There is shallow inspiration with bibasilar atelectasis. Mild chronic bronchitic changes. Mild eventration of the right hemidiaphragm. No focal consolidation, large pleural effusion, or pneumothorax. The cardiac silhouette is within normal limits. No acute osseous pathology. IMPRESSION: No active disease. Electronically Signed   By: Elgie CollardArash  Radparvar M.D.   On: 04/22/2019 21:32   Assessment: 68 year old with significant past history of anxiety/bipolar disorder/depression/chronic pain/polysubstance abuse/tremors/multiple evaluations before for intermittent lethargy confusion along with periods of unresponsiveness ever since  2012 presenting for evaluation of altered mental status and worsening mentation for the past 2 to 3 days. 1. Noted to have some hypercarbia, which is improving with noninvasive ventilation. 2. Although his examination has a functional overlay to it, I would attribute his current presentation primarily to toxic metabolic encephalopathy.   3. Carefully going through his medication list to ensure that there is no polypharmacy. A Psychiatry or Psychology consult would be beneficial for him 3. MRI: no acute abnormality.  4.EEG 04/23/2019: This electroencephalogram exhibits normal sleep.  Also noted are two episodes when the patient has deep breathing and generalized tremors.  There is no epileptic correlate with these episodes.   5. B12: WNL, TSH: WNL RPR: non-reactive, UDs: + barbituates, ammonia: WNL, UA: negative for UTI,  Impression: Toxic metabolic encephalopathy Evaluate for possible polypharmacy Evaluate for underlying infections Evaluate for conversion disorder  Recommendations: -- Correction of toxic metabolic derangements including respiratory derangements per primary team as you are -- Careful review of medications to evaluate for polypharmacy. -- Minimize all sedating medications -- When mentation improves and allows, obtain a psychiatry consult -- [facility] might be difficult given the weekend etc. -- Neurology will sign off. Please call if there are additional questions.   Valentina LucksJessica Williams, MSN, NP-C [facility] [phone]  Electronically signed: Dr. Caryl PinaEric Orian Amberg 04/24/2019, 9:55 AM

## 2019-04-24 NOTE — Care Management Obs Status (Signed)
MEDICARE OBSERVATION STATUS NOTIFICATION   Patient Details  Name: Alan Brown MRN: [id number] Date of Birth: 01-Jun-1951   Medicare Observation Status Notification Given:  Yes    Lawerance Sabal, RN 04/24/2019, 9:02 AM

## 2019-04-24 NOTE — Progress Notes (Signed)
Pt woke up around 2100 very confused and attempt to get oob. assisted with urinal and back to bed but pt remain agitated yelling that needed pain medication for chronic back pain dilaudid 0.5  X 1 given.

## 2019-04-24 NOTE — Progress Notes (Signed)
SLP Cancellation Note  Patient Details Name: Alan Brown MRN: [id number] DOB: 1951/04/04   Cancelled treatment:       Reason Eval/Treat Not Completed: Fatigue/lethargy limiting ability to participate;Patient's level of consciousness; pt not arousable for POs this morning despite attempts by SLP. RN reports waxing and waning mentation. Will continue to monitor for PO readiness.    Noelle Penner MA, CCC-SLP Acute Rehabilitation Services Pager: [phone] 04/24/2019, 8:53 AM

## 2019-04-25 DIAGNOSIS — F1920 Other psychoactive substance dependence, uncomplicated: Secondary | ICD-10-CM

## 2019-04-25 DIAGNOSIS — F3130 Bipolar disorder, current episode depressed, mild or moderate severity, unspecified: Secondary | ICD-10-CM

## 2019-04-25 DIAGNOSIS — F1120 Opioid dependence, uncomplicated: Secondary | ICD-10-CM

## 2019-04-25 LAB — CBC
HCT: 41.3 % (ref 39.0–52.0)
Hemoglobin: 14 g/dL (ref 13.0–17.0)
MCH: 30.4 pg (ref 26.0–34.0)
MCHC: 33.9 g/dL (ref 30.0–36.0)
MCV: 89.6 fL (ref 80.0–100.0)
Platelets: 162 10*3/uL (ref 150–400)
RBC: 4.61 MIL/uL (ref 4.22–5.81)
RDW: 13.7 % (ref 11.5–15.5)
WBC: 7.4 10*3/uL (ref 4.0–10.5)
nRBC: 0 % (ref 0.0–0.2)

## 2019-04-25 LAB — GLUCOSE, CAPILLARY
Glucose-Capillary: 101 mg/dL — ABNORMAL HIGH (ref 70–99)
Glucose-Capillary: 112 mg/dL — ABNORMAL HIGH (ref 70–99)
Glucose-Capillary: 118 mg/dL — ABNORMAL HIGH (ref 70–99)
Glucose-Capillary: 119 mg/dL — ABNORMAL HIGH (ref 70–99)
Glucose-Capillary: 131 mg/dL — ABNORMAL HIGH (ref 70–99)
Glucose-Capillary: 162 mg/dL — ABNORMAL HIGH (ref 70–99)

## 2019-04-25 LAB — BASIC METABOLIC PANEL
Anion gap: 12 (ref 5–15)
BUN: 13 mg/dL (ref 8–23)
CO2: 21 mmol/L — ABNORMAL LOW (ref 22–32)
Calcium: 9.1 mg/dL (ref 8.9–10.3)
Chloride: 107 mmol/L (ref 98–111)
Creatinine, Ser: 1.1 mg/dL (ref 0.61–1.24)
GFR calc Af Amer: >60 mL/min (ref >60)
GFR calc non Af Amer: >60 mL/min (ref >60)
Glucose, Bld: 157 mg/dL — ABNORMAL HIGH (ref 70–99)
Potassium: 3.2 mmol/L — ABNORMAL LOW (ref 3.5–5.1)
Sodium: 140 mmol/L (ref 135–145)

## 2019-04-25 MED ORDER — DIVALPROEX SODIUM 250 MG PO DR TAB
500.0000 mg | DELAYED_RELEASE_TABLET | Freq: Two times a day (BID) | ORAL | Status: DC
  Administered 2019-04-25 – 2019-04-27 (×4): 500 mg via ORAL
  Filled 2019-04-25 (×4): qty 2

## 2019-04-25 MED ORDER — PANTOPRAZOLE SODIUM 40 MG PO TBEC
40.0000 mg | DELAYED_RELEASE_TABLET | Freq: Every day | ORAL | Status: DC
  Administered 2019-04-25 – 2019-04-27 (×3): 40 mg via ORAL
  Filled 2019-04-25 (×3): qty 1

## 2019-04-25 MED ORDER — TAMSULOSIN HCL 0.4 MG PO CAPS
0.4000 mg | ORAL_CAPSULE | Freq: Every day | ORAL | Status: DC
  Administered 2019-04-25 – 2019-04-26 (×2): 0.4 mg via ORAL
  Filled 2019-04-25 (×2): qty 1

## 2019-04-25 MED ORDER — FERROUS SULFATE 325 (65 FE) MG PO TABS
325.0000 mg | ORAL_TABLET | ORAL | Status: DC
  Administered 2019-04-25 – 2019-04-27 (×3): 325 mg via ORAL
  Filled 2019-04-25 (×3): qty 1

## 2019-04-25 MED ORDER — ATORVASTATIN CALCIUM 10 MG PO TABS
20.0000 mg | ORAL_TABLET | Freq: Every day | ORAL | Status: DC
  Administered 2019-04-25 – 2019-04-26 (×2): 20 mg via ORAL
  Filled 2019-04-25 (×2): qty 2

## 2019-04-25 MED ORDER — ADULT MULTIVITAMIN W/MINERALS CH
1.0000 | ORAL_TABLET | Freq: Every day | ORAL | Status: DC
  Administered 2019-04-25 – 2019-04-27 (×3): 1 via ORAL
  Filled 2019-04-25 (×3): qty 1

## 2019-04-25 MED ORDER — ASPIRIN EC 81 MG PO TBEC
81.0000 mg | DELAYED_RELEASE_TABLET | Freq: Every day | ORAL | Status: DC
  Administered 2019-04-25 – 2019-04-27 (×3): 81 mg via ORAL
  Filled 2019-04-25 (×3): qty 1

## 2019-04-25 MED ORDER — RISPERIDONE 0.5 MG PO TABS: 0.2500 mg | ORAL_TABLET | Freq: Every evening | ORAL | Status: DC | PRN

## 2019-04-25 MED ORDER — UMECLIDINIUM BROMIDE 62.5 MCG/INH IN AEPB
1.0000 | INHALATION_SPRAY | Freq: Every day | RESPIRATORY_TRACT | Status: DC
  Administered 2019-04-25 – 2019-04-27 (×3): 1 via RESPIRATORY_TRACT
  Filled 2019-04-25: qty 7

## 2019-04-25 MED ORDER — DULOXETINE HCL 30 MG PO CPEP
30.0000 mg | ORAL_CAPSULE | Freq: Every day | ORAL | Status: DC
  Administered 2019-04-25 – 2019-04-26 (×2): 30 mg via ORAL
  Filled 2019-04-25 (×2): qty 1

## 2019-04-25 MED ORDER — PROPRANOLOL HCL ER 60 MG PO CP24
60.0000 mg | ORAL_CAPSULE | Freq: Every day | ORAL | Status: DC
  Administered 2019-04-25 – 2019-04-27 (×3): 60 mg via ORAL
  Filled 2019-04-25 (×4): qty 1

## 2019-04-25 MED ORDER — ENSURE MAX PROTEIN PO LIQD
11.0000 [oz_av] | Freq: Every day | ORAL | Status: DC
  Administered 2019-04-25 – 2019-04-27 (×3): 11 [oz_av] via ORAL
  Filled 2019-04-25 (×4): qty 330

## 2019-04-25 MED ORDER — POTASSIUM CHLORIDE CRYS ER 20 MEQ PO TBCR
40.0000 meq | EXTENDED_RELEASE_TABLET | Freq: Once | ORAL | Status: AC
  Administered 2019-04-25: 40 meq via ORAL
  Filled 2019-04-25: qty 2

## 2019-04-25 MED ORDER — ALBUTEROL SULFATE (2.5 MG/3ML) 0.083% IN NEBU: 2.5000 mg | INHALATION_SOLUTION | Freq: Four times a day (QID) | RESPIRATORY_TRACT | Status: DC | PRN

## 2019-04-25 NOTE — Progress Notes (Signed)
PROGRESS NOTE    Alan Brown  WJX:[id number] DOB: 10/03/1951 DOA: 04/22/2019 PCP: Clovis Riley, L.August Saucer, MD   Brief Narrative:  Alan Kenyon Andersonis a 68 y.o.malewithhistory of chronic pain, depression and tremors was brought to the ER the patient's family noticed that patient was increasingly altered in mental status. Symptoms started 3 days ago which improved briefly but again became more pronounced yesterday and was brought to the ER for further evaluation.   He required BIPAP for 24 hours, and he is transitioned to [place] oxygen . He is more alert today and answering simple questions.  On talking to his wife, she reports this has happened many times. Suspect this acute encephalopathy is probably from polypharmacy.   Assessment & Plan:   Principal Problem:   Acute encephalopathy Active Problems:   Bipolar 1 disorder (HCC)   Depression   Polysubstance (including opioids) dependence, daily use (HCC)   Chronic pain syndrome   Acute respiratory failure with hypoxia (HCC)   Acute metabolic encephalopathy secondary to acute respiratory failure with hypercarbia and poly pharmacy.  He required BIPAP For 24 hours and weaned him to [place] oxygen at 2 lit/min.  EEG negative. MRI brain negative for acute stroke.  CT angio of the chest negative for PE, pneumonia or effusions.  He is alert and answering questions appropriately, oriented to place and person, coherent.    H/o Bipolar disorder, with polysubstance abuse, and chronic pain syndrome.  b12 tsh, RPR , is negative. Ammonia wnl. Psychiatry will be consulted to restart his medications slowly.  Please avoid narcotic pain medications.    Nutrition: Restarted him on regular diet as per SLP evaluation.  PT/OT evaluation ordered.   AKI: Pre renal , probably from dehydration.  Creatinine back to baseline with IV fluids.     Hypertension:  Sub optimally controlled.  Restarted his home meds.    H/o COPD:  No wheezing  heard, resumed his home inhalers .     DVT prophylaxis: scd's Code Status: full code.  Family Communication: none at bedside. Discussed with wife over the phone on 5/10.  Disposition Plan: pending clinical improvement and PT evaluation, psychiatry evaluation.    Consultants:   Neurology.   Psychiatry     Procedures: MRI brian.   Antimicrobials: none.   Subjective: Alert , oriented to place , person.  Answering questions and following commands.   Objective: Vitals:   04/24/19 1502 04/24/19 2042 04/24/19 2356 04/25/19 0408  BP: (!) 151/77 (!) 163/82 (!) 155/81 (!) 163/83  Pulse: (!) 58 (!) 50 (!) 57 68  Resp: (!) 24  Temp: 98 F (36.7 C) 98.7 F (37.1 C) 99.1 F (37.3 C) 97.9 F (36.6 C)  TempSrc: Axillary Oral Oral Oral  SpO2: 94% 96% 98% 95%  Weight:        Intake/Output Summary (Last 24 hours) at 04/25/2019 1055 Last data filed at 04/25/2019 0838 Gross per 24 hour  Intake 1471.2 ml  Output 2300 ml  Net -828.8 ml   Filed Weights   04/23/19 0241 04/23/19 0455 04/24/19 0449  Weight: 101.9 kg 101.9 kg 100 kg    Examination:  General exam: Appears calm and comfortable on 2lit of [place] OXYGEN.  Respiratory system: Clear to auscultation. Respiratory effort normal. Cardiovascular system: S1 & S2 heard, RRR. No JVD,.No pedal edema. Gastrointestinal system: Abdomen is nondistended, soft and nontender. No organomegaly or masses felt. Normal bowel sounds heard. Central nervous system: Alert answering simple questions and following commands.  Extremities:  Symmetric 5 x 5 power. Skin: No rashes, lesions or ulcers Psychiatry:  Calm. Not agitated.    Data Reviewed: I have personally reviewed following labs and imaging studies  CBC: Recent Labs  Lab 04/22/19 2114 04/22/19 2211 04/23/19 0121 04/23/19 1015  WBC 7.4  --   --  6.0  NEUTROABS 4.1  --   --  3.4  HGB 12.4* 11.2* 12.2* 12.3*  HCT 38.8* 33.0* 36.0* 37.9*  MCV 95.1  --   --  95.0  PLT 148*   --   --  153   Basic Metabolic Panel: Recent Labs  Lab 04/22/19 2114 04/22/19 2211 04/23/19 0121 04/23/19 2054  NA 140 140 139 137  K 4.4 3.8 4.0 4.2  CL 100  --   --  103  CO2 29  --   --  24  GLUCOSE 134*  --   --  128*  BUN 15  --   --  13  CREATININE 1.43*  --   --  1.11  CALCIUM 8.7*  --   --  8.7*   GFR: CrCl cannot be calculated (Unknown ideal weight.). Liver Function Tests: Recent Labs  Lab 04/22/19 2114 04/23/19 1015  AST 37 30  ALT 14 13  ALKPHOS 39 39  BILITOT 0.4 0.5  PROT 6.0* 6.0*  ALBUMIN 3.2* 3.0*   Recent Labs  Lab 04/22/19 2114  LIPASE 22   Recent Labs  Lab 04/22/19 2125  AMMONIA 20   Coagulation Profile: Recent Labs  Lab 04/22/19 2114  INR 1.1   Cardiac Enzymes: Recent Labs  Lab 04/22/19 2114 04/23/19 0103 04/23/19 1015 04/23/19 2054  TROPONINI <0.03 <0.03 <0.03 <0.03   BNP (last 3 results) No results for input(s): PROBNP in the last 8760 hours. HbA1C: No results for input(s): HGBA1C in the last 72 hours. CBG: Recent Labs  Lab 04/24/19 1800 04/24/19 2038 04/25/19 0014 04/25/19 0349 04/25/19 0809  GLUCAP 114* 144* 112* 131* 119*   Lipid Profile: No results for input(s): CHOL, HDL, LDLCALC, TRIG, CHOLHDL, LDLDIRECT in the last 72 hours. Thyroid Function Tests: Recent Labs    04/22/19 2116  TSH 0.451   Anemia Panel: Recent Labs    04/23/19 1015  VITAMINB12 790  FOLATE 45.0  FERRITIN 34  TIBC 281  IRON 35*  RETICCTPCT 1.6   Sepsis Labs: Recent Labs  Lab 04/22/19 2119 04/23/19 0000  LATICACIDVEN 1.1 1.8    Recent Results (from the past 240 hour(s))  SARS Coronavirus 2 (CEPHEID- Performed in [facility] hospital lab), Hosp Order     Status: None   Collection Time: 04/22/19  9:31 PM  Result Value Ref Range Status   SARS Coronavirus 2 NEGATIVE NEGATIVE Final    Comment: (NOTE) If result is NEGATIVE SARS-CoV-2 target nucleic acids are NOT DETECTED. The SARS-CoV-2 RNA is generally detectable in upper  and lower  respiratory specimens during the acute phase of infection. The lowest  concentration of SARS-CoV-2 viral copies this assay can detect is 250  copies / mL. A negative result does not preclude SARS-CoV-2 infection  and should not be used as the sole basis for treatment or other  patient management decisions.  A negative result may occur with  improper specimen collection / handling, submission of specimen other  than nasopharyngeal swab, presence of viral mutation(s) within the  areas targeted by this assay, and inadequate number of viral copies  (<250 copies / mL). A negative result must be combined with clinical  observations, patient  history, and epidemiological information. If result is POSITIVE SARS-CoV-2 target nucleic acids are DETECTED. The SARS-CoV-2 RNA is generally detectable in upper and lower  respiratory specimens dur ing the acute phase of infection.  Positive  results are indicative of active infection with SARS-CoV-2.  Clinical  correlation with patient history and other diagnostic information is  necessary to determine patient infection status.  Positive results do  not rule out bacterial infection or co-infection with other viruses. If result is PRESUMPTIVE POSTIVE SARS-CoV-2 nucleic acids MAY BE PRESENT.   A presumptive positive result was obtained on the submitted specimen  and confirmed on repeat testing.  While 2019 novel coronavirus  (SARS-CoV-2) nucleic acids may be present in the submitted sample  additional confirmatory testing may be necessary for epidemiological  and / or clinical management purposes  to differentiate between  SARS-CoV-2 and other Sarbecovirus currently known to infect humans.  If clinically indicated additional testing with an alternate test  methodology [phone]) is advised. The SARS-CoV-2 RNA is generally  detectable in upper and lower respiratory sp ecimens during the acute  phase of infection. The expected result is  Negative. Fact Sheet for Patients:  BoilerBrush.com.cy Fact Sheet for Healthcare Providers: https://pope.com/ This test is not yet approved or cleared by the [place] FDA and has been authorized for detection and/or diagnosis of SARS-CoV-2 by FDA under an Emergency Use Authorization (EUA).  This EUA will remain in effect (meaning this test can be used) for the duration of the COVID-19 declaration under Section 564(b)(1) of the Act, 21 U.S.C. section 360bbb-3(b)(1), unless the authorization is terminated or revoked sooner. Performed at [facility], [address] [place], [place] [address]   Blood culture (routine x 2)     Status: None (Preliminary result)   Collection Time: 04/22/19 10:05 PM  Result Value Ref Range Status   Specimen Description BLOOD RIGHT ARM  Final   Special Requests   Final    BOTTLES DRAWN AEROBIC AND ANAEROBIC Blood Culture adequate volume   Culture   Final    NO GROWTH 3 DAYS Performed at [facility], [address] [place], [place] [address]    Report Status PENDING  Incomplete  Blood culture (routine x 2)     Status: None (Preliminary result)   Collection Time: 04/22/19 10:25 PM  Result Value Ref Range Status   Specimen Description BLOOD LEFT WRIST  Final   Special Requests   Final    BOTTLES DRAWN AEROBIC ONLY Blood Culture results may not be optimal due to an inadequate volume of blood received in culture bottles   Culture   Final    NO GROWTH 3 DAYS Performed at [facility], [address], [place], [place] [address]    Report Status PENDING  Incomplete  Urine culture     Status: None   Collection Time: 04/22/19 11:29 PM  Result Value Ref Range Status   Specimen Description URINE, CATHETERIZED  Final   Special Requests NONE  Final   Culture   Final    NO GROWTH Performed at [facility], [address], [place], [place] [address]    Report Status 04/24/2019 FINAL   Final         Radiology Studies: Mr Brain Wo Contrast  Result Date: 04/24/2019 CLINICAL DATA:  68 y/o M; altered mental status, lethargy, difficulty ambulating. EXAM: MRI HEAD WITHOUT CONTRAST TECHNIQUE: Multiplanar, multiecho pulse sequences of the brain and surrounding structures were  obtained without intravenous contrast. COMPARISON:  04/22/2019 CT head.  01/23/2014 MRI head. FINDINGS: Brain: No acute infarction, hemorrhage, hydrocephalus, extra-axial collection or mass lesion. Stable mild chronic microvascular ischemic changes and volume loss of the brain. Vascular: Normal flow voids. Skull and upper cervical spine: Normal marrow signal. Sinuses/Orbits: Negative. Other: None. IMPRESSION: 1. No acute intracranial abnormality identified. 2. Stable mild chronic microvascular ischemic changes and volume loss of the brain. Electronically Signed   By: Mitzi HansenLance  Furusawa-Stratton M.D.   On: 04/24/2019 01:30        Scheduled Meds: . aspirin EC  81 mg Oral Daily  . atorvastatin  20 mg Oral QHS  . ferrous sulfate  325 mg Oral Q M,W,F  . mouth rinse  15 mL Mouth Rinse BID  . pantoprazole  40 mg Oral Daily  . propranolol ER  60 mg Oral Daily  . tamsulosin  0.4 mg Oral QHS  . umeclidinium bromide  1 puff Inhalation Daily   Continuous Infusions: . dextrose 5 % and 0.9% NaCl 75 mL/hr at 04/25/19 0838     LOS: 1 day    Time spent: 34  minutes.     Kathlen ModyVijaya Edell Mesenbrink, MD [facility] Pager [phone]  If 7PM-7AM, please contact night-coverage [email or web address] Password [email or web address] 04/25/2019, 10:55 AM

## 2019-04-25 NOTE — Evaluation (Signed)
Clinical/Bedside Swallow Evaluation Patient Details  Name: Alan Brown MRN: [id number] Date of Birth: [birth year]  Today's Date: 04/25/2019 Time: SLP Start Time (ACUTE ONLY): 0815 SLP Stop Time (ACUTE ONLY): 0832 SLP Time Calculation (min) (ACUTE ONLY): 17 min  Past Medical History:  Past Medical History:  Diagnosis Date  . Anxiety   . Arthritis    "all through my body" (01/26/2014)  . Bipolar 1 disorder (HCC)   . Chronic lower back pain   . Depression   . Depression   . GERD (gastroesophageal reflux disease)   . Pneumonia 1952; 11/2013  . Prolapse, disk    lower back   Past Surgical History:  Past Surgical History:  Procedure Laterality Date  . APPENDECTOMY  1961  . INGUINAL HERNIA REPAIR Left 1998  . TONSILLECTOMY  19777   HPI:  68 year old with significant past history of anxiety/bipolar disorder/depression/chronic pain/polysubstance abuse/tremors/multiple evaluations before for intermittent lethargy confusion along with periods of unresponsiveness ever since 2012 presenting for evaluation of altered mental status and worsening mentation for the past 2 to 3 days.   Assessment / Plan / Recommendation Clinical Impression  Pt presents with functional oropharyngeal swallow at this time.  NO indication of aspiration or penetration with all po intake including thin, puree, crackers.  He easily passed 3 ounce water test.  Pt admits to occasional coughing with both food/drink.  No SLP follow up.   SLP Visit Diagnosis: Dysphagia, oropharyngeal phase (R13.12)    Aspiration Risk  No limitations    Diet Recommendation Regular;Thin liquid   Liquid Administration via: Cup;Straw Medication Administration: Whole meds with liquid Supervision: Patient able to self feed Compensations: Slow rate;Small sips/bites Postural Changes: Seated upright at 90 degrees;Remain upright for at least 30 minutes after po intake    Other  Recommendations Oral Care Recommendations: Oral care BID    Follow up Recommendations None      Frequency and Duration   n/a         Prognosis    n/a    Swallow Study   General Date of Onset: 04/22/19 HPI: 68 year old with significant past history of anxiety/bipolar disorder/depression/chronic pain/polysubstance abuse/tremors/multiple evaluations before for intermittent lethargy confusion along with periods of unresponsiveness ever since 2012 presenting for evaluation of altered mental status and worsening mentation for the past 2 to 3 days. Type of Study: Bedside Swallow Evaluation Diet Prior to this Study: NPO Temperature Spikes Noted: Yes(99.1) Respiratory Status: Room air History of Recent Intubation: No Behavior/Cognition: Alert;Cooperative;Pleasant mood Oral Cavity Assessment: Within Functional Limits Oral Care Completed by SLP: No Oral Cavity - Dentition: Adequate natural dentition Vision: Functional for self-feeding Self-Feeding Abilities: Able to feed self Patient Positioning: Upright in bed Baseline Vocal Quality: Normal Volitional Cough: Strong Volitional Swallow: Able to elicit    Oral/Motor/Sensory Function Overall Oral Motor/Sensory Function: Within functional limits   Ice Chips Ice chips: Within functional limits   Thin Liquid Thin Liquid: Within functional limits Presentation: Cup;Straw    Nectar Thick Nectar Thick Liquid: Not tested   Honey Thick Honey Thick Liquid: Not tested   Puree Puree: Within functional limits Presentation: Self Fed;Spoon   Solid     Solid: Impaired Presentation: Self Fed Oral Phase Functional Implications: Other (comment)(prolonged mastication)      Alan Brown 04/25/2019,8:42 AM  Donavan Burnet, MS [facility] SLP Acute Rehab Services Pager [phone] Office [phone]

## 2019-04-25 NOTE — Progress Notes (Signed)
Initial Nutrition Assessment  RD working remotely.  DOCUMENTATION CODES:   Obesity unspecified  INTERVENTION:   -Ensure Max po daily, each supplement provides 150 kcal and 30 grams of protein -MVI with minerals daily  NUTRITION DIAGNOSIS:   Increased nutrient needs related to acute illness(acute encephalopathy) as evidenced by estimated needs.  GOAL:   Patient will meet greater than or equal to 90% of their needs  MONITOR:   PO intake, Supplement acceptance, Labs, Weight trends, Skin, I & O's  REASON FOR ASSESSMENT:   Low Braden    ASSESSMENT:   Alan Brown is a 68 y.o. male with history of chronic pain, depression and tremors was brought to the ER the patient's family noticed that patient was increasingly altered in mental status.  Symptoms started 3 days ago which improved briefly but again became more pronounced yesterday and was brought to the ER.  Patient is on chronic pain medications and there was no mention of any suicidal thoughts of overdose.  Pt admitted with acute encephalopathy, suspect related to polypharmacy.  5/10- s/p BSE- recommend NPO due to lethargy 5/11- s/p BSE- recommend advance to regular diet  Reviewed I/O's: -430 ml x 24 hours and -946 ml since admission  UOP: 2.3 L x 24 hours  Per chart review, pt with waxing and waning mentation, which has improved today.  Attempted to speak with pt via phone to obtain history, however, unable to reach (no answer when called). Unable to obtain further nutrition-related history at this time.   Pt was just advanced to a PO diet this morning. There is no meal completion data to assess at this time. Per MST report, pt denies weight loss or poor appetite PTA. Reviewed wt records; pt has experienced a 4.5% wt loss over the past 5 months, which is not significant for time frame. Pt's wt fluctuates between 217-227 pounds.   Lab Results  Component Value Date   HGBA1C (H) 01/15/2011    5.7 (NOTE)                                                                        According to the ADA Clinical Practice Recommendations for 2011, when HbA1c is used as a screening test:   >=6.5%   Diagnostic of Diabetes Mellitus           (if abnormal result  is confirmed)  5.7-6.4%   Increased risk of developing Diabetes Mellitus  References:Diagnosis and Classification of Diabetes Mellitus,Diabetes Care,2011,34(Suppl 1):S62-S69 and Standards of Medical Care in         Diabetes - 2011,Diabetes Care,2011,34  (Suppl 1):S11-S61.   PTA DM medications are none.   Labs reviewed: CBGS: 112-144 (inpatient orders for glycemic control are none).   NUTRITION - FOCUSED PHYSICAL EXAM:    Most Recent Value  Orbital Region  Unable to assess  Upper Arm Region  Unable to assess  Thoracic and Lumbar Region  Unable to assess  Buccal Region  Unable to assess  Temple Region  Unable to assess  Clavicle Bone Region  Unable to assess  Clavicle and Acromion Bone Region  Unable to assess  Scapular Bone Region  Unable to assess  Dorsal Hand  Unable to assess  Patellar Region  Unable to assess  Anterior Thigh Region  Unable to assess  Posterior Calf Region  Unable to assess  Edema (RD Assessment)  Unable to assess  Hair  Unable to assess  Eyes  Unable to assess  Mouth  Unable to assess  Skin  Unable to assess  Nails  Unable to assess       Diet Order:   Diet Order            Diet regular Room service appropriate? Yes; Fluid consistency: Thin  Diet effective now              EDUCATION NEEDS:   No education needs have been identified at this time  Skin:  Skin Assessment: Reviewed RN Assessment  Last BM:  04/24/19  Height:   Ht Readings from Last 1 Encounters:  09/21/18 5\' 10"  (1.778 m)    Weight:   Wt Readings from Last 1 Encounters:  04/24/19 100 kg    Ideal Body Weight:  75.5 kg  BMI:  Body mass index is 31.63 kg/m.  Estimated Nutritional Needs:   Kcal:  2050-2250  Protein:  100-115  grams  Fluid:  > 2 L     Arwin Bisceglia A. Mayford Knife, RD, LDN, CDCES Registered Dietitian II Certified Diabetes Care and Education Specialist Pager: [phone] After hours Pager: [phone]

## 2019-04-25 NOTE — TOC Progression Note (Signed)
Transition of Care [facility]) - Progression Note    Patient Details  Name: Alan Brown MRN: [id number] Date of Birth: 09-15-1951  Transition of Care [facility]) CM/SW Contact  Kermit Balo, RN Phone Number: 04/25/2019, 12:05 PM  Clinical Narrative:    Awaiting PT/OT evals and psychiatry to assess. CM following.   Expected Discharge Plan: Home w Home Health Services Barriers to Discharge: Continued Medical Work up  Expected Discharge Plan and Services Expected Discharge Plan: Home w Home Health Services     Post Acute Care Choice: Home Health, Resumption of Svcs/PTA Provider Living arrangements for the past 2 months: Single Family Home                           HH Arranged: RN [facility] Agency: [facility] Date HH Agency Contacted: 04/23/19 Time HH Agency Contacted: 1121 Representative spoke with at [facility] Agency: Barbara Cower notified CM that patient was active   Social Determinants of Health (SDOH) Interventions    Readmission Risk Interventions No flowsheet data found.

## 2019-04-25 NOTE — Consult Note (Signed)
[facility]   Reason for Consult:  Medication management Referring Physician:  Dr. Kathlen ModyVijaya Akula Patient Identification: Alan Brown MRN:  [id number] Principal Diagnosis: Altered mental status Diagnosis:  Principal Problem:   Acute encephalopathy Active Problems:   Bipolar 1 disorder (HCC)   Depression   Polysubstance (including opioids) dependence, daily use (HCC)   Chronic pain syndrome   Acute respiratory failure with hypoxia (HCC)   Total Time spent with patient: 1 hour  Subjective:   Alan Brown is a 68 y.o. male patient admitted with acute metabolic encephalopathy.  HPI:   Per chart review, patient was admitted with acute metabolic encephalopathy secondary to acute respiratory failure with hypercarbia and polypharmacy. He has reportedly had similar episodes in the past. He has a history of bipolar disorder and polysubstance abuse. UDS was positive for barbiturates on admission. Psychiatry was consulted for medication management. Home medications include Buspar 15 mg BID, Depakote 1500 mg qhs, Cymbalta 60 mg daily, Risperdal 0.5 mg daily for anger/irritability, Atarax 25 mg BID and 25-50 mg qhs for pain and insomnia, Primidone 50 mg TID for tremors, Oxycodone 10-325 mg QID PRN and Norco/Vicodin 5-325 mg TID PRN for pain. PMP indicates that he has not received Norco since 01/26/2019. He last refilled Lyrica 300 mg (#30) on 03/18/2019. He received Haldol 2.5 mg overnight. He is clear in mental status today.   Of note, patient was last seen by psychiatry in 2015 for medication management in the setting of altered mental status. Risperdal was decreased to 2 mg daily and Klonopin was discontinued due to slurred speech.   On interview, Alan Brown exhibits difficulty with word articulation. He is oriented to person, place and year. He reports medication compliance. He is unable to name his medications and reports that his wife manages his  medications. He reports a stable mood. He reports a history of manic episodes with last episode several months ago. He reports poor sleep due to multiple nighttime awakenings. He reports fair appetite and denies changes in weight. He denies SI, HI or AVH.  Patient's wife was contacted by phone for collateral. She reports onset of confusion 3 days ago. He also started Oxycodone a day ago. She reports that he has cognitive impairments likely secondary to medications but he has intermittent periods of confusion. His last episode was 5 years ago. She believes that these episodes are caused by polypharmacy. He has several doctors that he is followed by at the [facility] and she feels like there is poor collaboration with regards to medication management. A year ago Lithium was stopped and started was Depakote. He has been very tired lately and easily falls asleep when he is sitting on the couch.   Past Psychiatric History: BPAD, anxiety and depression.   Risk to Self:  None. Denies SI.  Risk to Others:  None. Denies HI. Prior Inpatient Therapy:  Denies  Prior Outpatient Therapy:  [facility]   Past Medical History:  Past Medical History:  Diagnosis Date  . Anxiety   . Arthritis    "all through my body" (01/26/2014)  . Bipolar 1 disorder (HCC)   . Chronic lower back pain   . Depression   . Depression   . GERD (gastroesophageal reflux disease)   . Pneumonia 1952; 11/2013  . Prolapse, disk    lower back    Past Surgical History:  Procedure Laterality Date  . APPENDECTOMY  1961  . INGUINAL HERNIA REPAIR Left  1998  . TONSILLECTOMY  1976   Family History:  Family History  Problem Relation Age of Onset  . Hypertension Mother   . Hypertension Father   . Aneurysm Father   . Other Sister        killed  . Diabetes Sister   . Heart disease Sister    Family Psychiatric  History: Denies  Social History:  Social History   Substance and Sexual Activity  Alcohol Use No  .  Frequency: Never   Comment:  "quit drinking 25 yr ago; I'm a recovering alcoholic"     Social History   Substance and Sexual Activity  Drug Use No    Social History   Socioeconomic History  . Marital status: Married    Spouse name: Not on file  . Number of children: Not on file  . Years of education: Not on file  . Highest education level: Not on file  Occupational History  . Occupation: disabled veteran  Social Needs  . Financial resource strain: Not on file  . Food insecurity:    Worry: Not on file    Inability: Not on file  . Transportation needs:    Medical: Not on file    Non-medical: Not on file  Tobacco Use  . Smoking status: Former Smoker    Packs/day: 1.00    Years: 25.00    Pack years: 25.00    Types: Cigarettes  . Smokeless tobacco: Never Used  . Tobacco comment: 01/26/2014 "quit smoking 15-16 yr ago"  Substance and Sexual Activity  . Alcohol use: No    Frequency: Never    Comment:  "quit drinking 25 yr ago; I'm a recovering alcoholic"  . Drug use: No  . Sexual activity: Not Currently  Lifestyle  . Physical activity:    Days per week: Not on file    Minutes per session: Not on file  . Stress: Not on file  Relationships  . Social connections:    Talks on phone: Not on file    Gets together: Not on file    Attends religious service: Not on file    Active member of club or organization: Not on file    Attends meetings of clubs or organizations: Not on file    Relationship status: Not on file  Other Topics Concern  . Not on file  Social History Narrative  . Not on file   Additional Social History: He lives at home with his wife and 3 young children (69 y/o twins and 65 month old). He is a [place] veteran. He was in the [employer] from [phone].     Allergies:   Allergies  Allergen Reactions  . Naproxen Other (See Comments)    Increases lithium toxicity.   . Gabapentin Other (See Comments)    Unknown reaction  . Morphine  And Related Other (See Comments)    Inability to function/ disorientation    Labs:  Results for orders placed or performed during the hospital encounter of 04/22/19 (from the past 48 hour(s))  Glucose, capillary     Status: Abnormal   Collection Time: 04/23/19  3:46 PM  Result Value Ref Range   Glucose-Capillary 63 (L) 70 - 99 mg/dL  Glucose, capillary     Status: Abnormal   Collection Time: 04/23/19  4:13 PM  Result Value Ref Range   Glucose-Capillary 59 (L) 70 - 99 mg/dL  Glucose, capillary     Status: None   Collection Time:  04/23/19  4:36 PM  Result Value Ref Range   Glucose-Capillary 71 70 - 99 mg/dL   Comment 1 Notify RN   Glucose, capillary     Status: None   Collection Time: 04/23/19  5:07 PM  Result Value Ref Range   Glucose-Capillary 96 70 - 99 mg/dL  Blood gas, arterial     Status: Abnormal   Collection Time: 04/23/19  5:20 PM  Result Value Ref Range   FIO2 40.00    Delivery systems BILEVEL POSITIVE AIRWAY PRESSURE    Inspiratory PAP 16.0    Expiratory PAP 8.0    pH, Arterial 7.474 (H) 7.350 - 7.450   pCO2 arterial 38.0 32.0 - 48.0 mmHg   pO2, Arterial 159 (H) 83.0 - 108.0 mmHg   Bicarbonate 27.6 20.0 - 28.0 mmol/L   Acid-Base Excess 4.1 (H) 0.0 - 2.0 mmol/L   O2 Saturation 99.3 %   Patient temperature 98.6    Collection site LEFT RADIAL    Drawn by [id number]    Sample type ARTERIAL DRAW    Allens test (pass/fail) PASS PASS  Glucose, capillary     Status: None   Collection Time: 04/23/19  6:58 PM  Result Value Ref Range   Glucose-Capillary 97 70 - 99 mg/dL  Basic metabolic panel     Status: Abnormal   Collection Time: 04/23/19  8:54 PM  Result Value Ref Range   Sodium 137 135 - 145 mmol/L   Potassium 4.2 3.5 - 5.1 mmol/L   Chloride 103 98 - 111 mmol/L   CO2 24 22 - 32 mmol/L   Glucose, Bld 128 (H) 70 - 99 mg/dL   BUN 13 8 - 23 mg/dL   Creatinine, Ser 4.13 0.61 - 1.24 mg/dL   Calcium 8.7 (L) 8.9 - 10.3 mg/dL   GFR calc non Af Amer >60 >60 mL/min   GFR  calc Af Amer >60 >60 mL/min   Anion gap 10 5 - 15    Comment: Performed at [facility], [address] [place], [place] [address]  Troponin I -     Status: None   Collection Time: 04/23/19  8:54 PM  Result Value Ref Range   Troponin I <0.03 <0.03 ng/mL    Comment: Performed at [facility], [address] [place], [place] [address]  Glucose, capillary     Status: Abnormal   Collection Time: 04/23/19  8:54 PM  Result Value Ref Range   Glucose-Capillary 120 (H) 70 - 99 mg/dL  Glucose, capillary     Status: Abnormal   Collection Time: 04/23/19 11:13 PM  Result Value Ref Range   Glucose-Capillary 130 (H) 70 - 99 mg/dL  Glucose, capillary     Status: Abnormal   Collection Time: 04/24/19  4:46 AM  Result Value Ref Range   Glucose-Capillary 118 (H) 70 - 99 mg/dL  Glucose, capillary     Status: Abnormal   Collection Time: 04/24/19  7:54 AM  Result Value Ref Range   Glucose-Capillary 120 (H) 70 - 99 mg/dL   Comment 1 Notify RN    Comment 2 Document in Chart   Glucose, capillary     Status: Abnormal   Collection Time: 04/24/19 11:05 AM  Result Value Ref Range   Glucose-Capillary 112 (H) 70 - 99 mg/dL   Comment 1 Notify RN    Comment 2 Document in Chart   Glucose, capillary     Status: Abnormal   Collection Time: 04/24/19  6:00  PM  Result Value Ref Range   Glucose-Capillary 114 (H) 70 - 99 mg/dL   Comment 1 Notify RN    Comment 2 Document in Chart   Glucose, capillary     Status: Abnormal   Collection Time: 04/24/19  8:38 PM  Result Value Ref Range   Glucose-Capillary 144 (H) 70 - 99 mg/dL  Glucose, capillary     Status: Abnormal   Collection Time: 04/25/19 12:14 AM  Result Value Ref Range   Glucose-Capillary 112 (H) 70 - 99 mg/dL   Comment 1 Notify RN    Comment 2 Document in Chart   Glucose, capillary     Status: Abnormal   Collection Time: 04/25/19  3:49 AM  Result Value Ref Range   Glucose-Capillary 131 (H) 70 - 99 mg/dL   Comment 1 Notify RN    Comment 2  Document in Chart   Glucose, capillary     Status: Abnormal   Collection Time: 04/25/19  8:09 AM  Result Value Ref Range   Glucose-Capillary 119 (H) 70 - 99 mg/dL   Comment 1 Notify RN    Comment 2 Document in Chart   CBC     Status: None   Collection Time: 04/25/19 11:07 AM  Result Value Ref Range   WBC 7.4 4.0 - 10.5 K/uL   RBC 4.61 4.22 - 5.81 MIL/uL   Hemoglobin 14.0 13.0 - 17.0 g/dL   HCT 40.9 81.1 - 91.4 %   MCV 89.6 80.0 - 100.0 fL   MCH 30.4 26.0 - 34.0 pg   MCHC 33.9 30.0 - 36.0 g/dL   RDW 78.2 95.6 - 21.3 %   Platelets 162 150 - 400 K/uL   nRBC 0.0 0.0 - 0.2 %    Comment: Performed at [facility], [address], [place], [place] [address]  Basic metabolic panel     Status: Abnormal   Collection Time: 04/25/19 11:07 AM  Result Value Ref Range   Sodium 140 135 - 145 mmol/L   Potassium 3.2 (L) 3.5 - 5.1 mmol/L   Chloride 107 98 - 111 mmol/L   CO2 21 (L) 22 - 32 mmol/L   Glucose, Bld 157 (H) 70 - 99 mg/dL   BUN 13 8 - 23 mg/dL   Creatinine, Ser 8.46 0.61 - 1.24 mg/dL   Calcium 9.1 8.9 - 96.2 mg/dL   GFR calc non Af Amer >60 >60 mL/min   GFR calc Af Amer >60 >60 mL/min   Anion gap 12 5 - 15    Comment: Performed at [facility], [address], [place], [place] [address]  Glucose, capillary     Status: Abnormal   Collection Time: 04/25/19 11:44 AM  Result Value Ref Range   Glucose-Capillary 118 (H) 70 - 99 mg/dL   Comment 1 Notify RN    Comment 2 Document in Chart     Current Facility-Administered Medications  Medication Dose Route Frequency Provider Last Rate Last Dose  . acetaminophen (TYLENOL) tablet 650 mg  650 mg Oral Q6H PRN Eduard Clos, MD       Or  . acetaminophen (TYLENOL) suppository 650 mg  650 mg Rectal Q6H PRN Eduard Clos, MD      . albuterol (PROVENTIL) (2.5 MG/3ML) 0.083% nebulizer solution 2.5 mg  2.5 mg Inhalation QID PRN Kathlen Mody, MD      . aspirin EC tablet 81 mg  81 mg Oral Daily Kathlen Mody, MD   81 mg  at 04/25/19 0922  . atorvastatin (LIPITOR) tablet 20 mg  20 mg Oral QHS Kathlen Mody, MD      . ferrous sulfate tablet 325 mg  325 mg Oral Q M,W,F Kathlen Mody, MD   325 mg at 04/25/19 1610  . MEDLINE mouth rinse  15 mL Mouth Rinse BID Kathlen Mody, MD   15 mL at 04/24/19 2144  . multivitamin with minerals tablet 1 tablet  1 tablet Oral Daily Kathlen Mody, MD   1 tablet at 04/25/19 1308  . ondansetron (ZOFRAN) tablet 4 mg  4 mg Oral Q6H PRN Eduard Clos, MD       Or  . ondansetron [facility]) injection 4 mg  4 mg Intravenous Q6H PRN Eduard Clos, MD      . pantoprazole (PROTONIX) EC tablet 40 mg  40 mg Oral Daily Kathlen Mody, MD   40 mg at 04/25/19 9604  . propranolol ER (INDERAL LA) 24 hr capsule 60 mg  60 mg Oral Daily Kathlen Mody, MD   60 mg at 04/25/19 5409  . protein supplement (ENSURE MAX) liquid  11 oz Oral Daily Kathlen Mody, MD      . tamsulosin (FLOMAX) capsule 0.4 mg  0.4 mg Oral QHS Kathlen Mody, MD      . umeclidinium bromide (INCRUSE ELLIPTA) 62.5 MCG/INH 1 puff  1 puff Inhalation Daily Kathlen Mody, MD   1 puff at 04/25/19 1036    Musculoskeletal: Strength & Muscle Tone: Generalized weakness. Gait & Station: UTA since patient is lying in bed. Patient leans: N/A  Psychiatric Specialty Exam: Physical Exam  Nursing note and vitals reviewed. Constitutional: He appears well-developed and well-nourished.  HENT:  Head: Normocephalic and atraumatic.  Neck: Normal range of motion.  Respiratory: Effort normal.  Musculoskeletal: Normal range of motion.  Neurological: He is alert.  Oriented to self, year and place.  Psychiatric: His speech is normal and behavior is normal. Judgment and thought content normal. His mood appears anxious. Cognition and memory are impaired.    Review of Systems  Psychiatric/Behavioral: Negative for depression, hallucinations and suicidal ideas. The patient has insomnia.   All other systems reviewed and are negative.   Blood  pressure (!) 143/87, pulse 73, temperature 98.1 F (36.7 C), temperature source Oral, resp. rate 18, weight 100 kg, SpO2 96 %.Body mass index is 31.63 kg/m.  General Appearance: Fairly Groomed, elderly, Caucasian male, wearing a hospital gown with shaved head and multiple body tattoos who is lying in bed. NAD.   Eye Contact:  Good  Speech:  Clear and Coherent and Normal Rate  Volume:  Normal  Mood:  Euthymic  Affect:  Constricted  Thought Process:  Linear and Descriptions of Associations: Intact  Orientation:  Other:  Oriented to person, place and year.  Thought Content:  Logical  Suicidal Thoughts:  No  Homicidal Thoughts:  No  Memory:  Immediate;   Fair Recent;   Fair Remote;   Fair  Judgement:  Fair  Insight:  Fair  Psychomotor Activity:  Decreased  Concentration:  Concentration: Good and Attention Span: Good  Recall:  [employer] of Knowledge:  Fair  Language:  Fair. Has difficulty with articulation at times.   Akathisia:  No  Handed:  Right  AIMS (if indicated):   N/A  Assets:  Communication Skills Desire for Improvement Financial Resources/Insurance Housing Resilience Social Support  ADL's:  Impaired  Cognition: Impaired with short term memory deficits.   Sleep:   Fair  Assessment:  Alan Brown is a 68 y.o. male who was admitted with acute metabolic encephalopathy secondary to acute respiratory failure with hypercarbia and polypharmacy. Patient is oriented to person, place and year today although he has short term memory impairments noted throughout interview. Patient reports a stable mood. He denies SI, HI or AVH. Recommend simplifying medication regimen given AMS likely secondary to polypharmacy. Patient should follow up with his outpatient provider for further medication management.   Treatment Plan Summary: -Decrease Depakote 1500 mg qhs to 500 mg BID for mood stabilization. -Continue Cymbalta 60 mg daily for mood. -Discontinue Buspar and  Atarax. -Change Risperdal 0.5 mg daily to 0.5 mg daily PRN for anger/irritability.  -EKG reviewed and QTc 460 on 5/8. Please closely monitor when starting or increasing QTc prolonging agents.  -Patient should follow up with outpatient provider for medication management.  -Psychiatry will sign off on patient at this time. Please consult psychiatry again as needed.   Disposition: No evidence of imminent risk to self or others at present.   Patient does not meet criteria for psychiatric inpatient admission.  Cherly Beach, DO 04/25/2019 2:45 PM

## 2019-04-26 ENCOUNTER — Other Ambulatory Visit: Payer: Self-pay

## 2019-04-26 LAB — GLUCOSE, CAPILLARY
Glucose-Capillary: 104 mg/dL — ABNORMAL HIGH (ref 70–99)
Glucose-Capillary: 104 mg/dL — ABNORMAL HIGH (ref 70–99)
Glucose-Capillary: 105 mg/dL — ABNORMAL HIGH (ref 70–99)
Glucose-Capillary: 106 mg/dL — ABNORMAL HIGH (ref 70–99)
Glucose-Capillary: 122 mg/dL — ABNORMAL HIGH (ref 70–99)
Glucose-Capillary: 126 mg/dL — ABNORMAL HIGH (ref 70–99)
Glucose-Capillary: 129 mg/dL — ABNORMAL HIGH (ref 70–99)

## 2019-04-26 MED ORDER — DULOXETINE HCL 60 MG PO CPEP
60.0000 mg | ORAL_CAPSULE | Freq: Every day | ORAL | Status: DC
  Administered 2019-04-27: 60 mg via ORAL
  Filled 2019-04-26: qty 1

## 2019-04-26 MED ORDER — TRAMADOL HCL 50 MG PO TABS
50.0000 mg | ORAL_TABLET | Freq: Four times a day (QID) | ORAL | Status: DC | PRN
  Administered 2019-04-26 – 2019-04-27 (×3): 50 mg via ORAL
  Filled 2019-04-26 (×3): qty 1

## 2019-04-26 MED ORDER — RISPERIDONE 0.5 MG PO TABS: 0.5000 mg | ORAL_TABLET | Freq: Every evening | ORAL | Status: DC | PRN

## 2019-04-26 MED ORDER — POTASSIUM CHLORIDE CRYS ER 20 MEQ PO TBCR
40.0000 meq | EXTENDED_RELEASE_TABLET | Freq: Once | ORAL | Status: AC
  Administered 2019-04-26: 40 meq via ORAL
  Filled 2019-04-26: qty 2

## 2019-04-26 NOTE — Progress Notes (Signed)
PROGRESS NOTE    Alan Brown  NWG:[id number] DOB: 1951-02-22 DOA: 04/22/2019 PCP: Clovis RileyMitchell, L.August Saucerean, MD   Brief Narrative:  Alan PandyJames Richard Andersonis a 68 y.o.malewithhistory of chronic pain, depression and tremors was brought to the ER the patient's family noticed that patient was increasingly altered in mental status. Symptoms started 3 days ago which improved briefly but again became more pronounced yesterday and was brought to the ER for further evaluation.   He required BIPAP for 24 hours, and he is transitioned to [place] oxygen . He is more alert today and answering simple questions.  On talking to his wife, she reports this has happened many times. Suspect this acute encephalopathy is probably from polypharmacy.   Assessment & Plan:   Principal Problem:   Altered mental status Active Problems:   Acute encephalopathy   Bipolar 1 disorder (HCC)   Depression   Polysubstance (including opioids) dependence, daily use (HCC)   Chronic pain syndrome   Acute respiratory failure with hypoxia (HCC)   Acute metabolic encephalopathy secondary to acute respiratory failure with hypercarbia and poly pharmacy.  He required BIPAP For 24 hours and weaned him to [place] oxygen at 2 lit/min on 5/11 and currently he is on RA with good oxygen sats.  EEG negative for epileptiform activity. MRI brain negative for acute stroke.  CT angio of the chest negative for PE, pneumonia or effusions.  He is alert and answering questions appropriately, oriented to place and person, coherent. But wife reports that pt is not finishing his sentences while talking on the phone and says he is not back to baseline yet.     H/o Bipolar disorder, with polysubstance abuse, and chronic pain syndrome.  b12 tsh, RPR , is negative. Ammonia wnl. Psychiatry consulted and he was started on Depakote at 500 mg BID, Risperdal at 0.5 qhs prn, Cymbalta at 60 mg daily.  Please avoid narcotic pain medications.  Use tylenol or  tramadol for pain.    Nutrition: Restarted him on regular diet as per SLP evaluation.  PT/OT evaluation ordered.   AKI: Pre renal , probably from dehydration.  Creatinine back to baseline with IV fluids.     Hypertension:  Well controlled.  Restarted his home meds.    H/o COPD:  No wheezing heard, resumed his home inhalers .   Hypokalemia:  Replaced.    DVT prophylaxis: scd's Code Status: full code.  Family Communication: none at bedside. Discussed with wife over the phone on 5/12 Disposition Plan: pending clinical improvement and PT evaluation, psychiatry evaluation.    Consultants:   Neurology.   Psychiatry     Procedures: MRI brian.   Antimicrobials: none.   Subjective: Alert , oriented to place , person.  Answering questions and following commands. Reports mild headache, no chest pain or back pain.   Objective: Vitals:   04/25/19 2352 04/26/19 0500 04/26/19 0809 04/26/19 1127  BP: (!) 166/84 (!) 147/97 (!) 148/99 (!) 148/95  Pulse: 62  66 78  Resp: 19 18 16 17   Temp: (!) 97.5 F (36.4 C) 97.6 F (36.4 C) 97.7 F (36.5 C) 98.1 F (36.7 C)  TempSrc: Oral Oral Oral Oral  SpO2: 96%  96% 97%  Weight:        Intake/Output Summary (Last 24 hours) at 04/26/2019 1209 Last data filed at 04/26/2019 0502 Gross per 24 hour  Intake 240 ml  Output 700 ml  Net -460 ml   Filed Weights   04/23/19 0241 04/23/19 0455 04/24/19 0449  Weight: 101.9 kg 101.9 kg 100 kg    Examination:  General exam: Appears calm and comfortable on RA.  Respiratory system: Clear to auscultation. Respiratory effort normal. Cardiovascular system: S1 & S2 heard, RRR. No JVD,.No pedal edema. Gastrointestinal system: Abdomen is nondistended, soft and nontender. No organomegaly or masses felt. Normal bowel sounds heard. Central nervous system: Alert answering simple questions and following commands.  Extremities: Symmetric 5 x 5 power. Skin: No rashes, lesions or ulcers  Psychiatry:  Calm. Not agitated.    Data Reviewed: I have personally reviewed following labs and imaging studies  CBC: Recent Labs  Lab 04/22/19 2114 04/22/19 2211 04/23/19 0121 04/23/19 1015 04/25/19 1107  WBC 7.4  --   --  6.0 7.4  NEUTROABS 4.1  --   --  3.4  --   HGB 12.4* 11.2* 12.2* 12.3* 14.0  HCT 38.8* 33.0* 36.0* 37.9* 41.3  MCV 95.1  --   --  95.0 89.6  PLT 148*  --   --  153 162   Basic Metabolic Panel: Recent Labs  Lab 04/22/19 2114 04/22/19 2211 04/23/19 0121 04/23/19 2054 04/25/19 1107  NA 140 140 139 137 140  K 4.4 3.8 4.0 4.2 3.2*  CL 100  --   --  103 107  CO2 29  --   --  24 21*  GLUCOSE 134*  --   --  128* 157*  BUN 15  --   --  13 13  CREATININE 1.43*  --   --  1.11 1.10  CALCIUM 8.7*  --   --  8.7* 9.1   GFR: CrCl cannot be calculated (Unknown ideal weight.). Liver Function Tests: Recent Labs  Lab 04/22/19 2114 04/23/19 1015  AST 37 30  ALT 14 13  ALKPHOS 39 39  BILITOT 0.4 0.5  PROT 6.0* 6.0*  ALBUMIN 3.2* 3.0*   Recent Labs  Lab 04/22/19 2114  LIPASE 22   Recent Labs  Lab 04/22/19 2125  AMMONIA 20   Coagulation Profile: Recent Labs  Lab 04/22/19 2114  INR 1.1   Cardiac Enzymes: Recent Labs  Lab 04/22/19 2114 04/23/19 0103 04/23/19 1015 04/23/19 2054  TROPONINI <0.03 <0.03 <0.03 <0.03   BNP (last 3 results) No results for input(s): PROBNP in the last 8760 hours. HbA1C: No results for input(s): HGBA1C in the last 72 hours. CBG: Recent Labs  Lab 04/25/19 1609 04/25/19 2021 04/25/19 2356 04/26/19 0439 04/26/19 0806  GLUCAP 162* 101* 122* 104* 105*   Lipid Profile: No results for input(s): CHOL, HDL, LDLCALC, TRIG, CHOLHDL, LDLDIRECT in the last 72 hours. Thyroid Function Tests: No results for input(s): TSH, T4TOTAL, FREET4, T3FREE, THYROIDAB in the last 72 hours. Anemia Panel: No results for input(s): VITAMINB12, FOLATE, FERRITIN, TIBC, IRON, RETICCTPCT in the last 72 hours. Sepsis Labs: Recent Labs   Lab 04/22/19 2119 04/23/19 0000  LATICACIDVEN 1.1 1.8    Recent Results (from the past 240 hour(s))  SARS Coronavirus 2 (CEPHEID- Performed in [facility] hospital lab), Hosp Order     Status: None   Collection Time: 04/22/19  9:31 PM  Result Value Ref Range Status   SARS Coronavirus 2 NEGATIVE NEGATIVE Final    Comment: (NOTE) If result is NEGATIVE SARS-CoV-2 target nucleic acids are NOT DETECTED. The SARS-CoV-2 RNA is generally detectable in upper and lower  respiratory specimens during the acute phase of infection. The lowest  concentration of SARS-CoV-2 viral copies this assay can detect is 250  copies / mL. A negative  result does not preclude SARS-CoV-2 infection  and should not be used as the sole basis for treatment or other  patient management decisions.  A negative result may occur with  improper specimen collection / handling, submission of specimen other  than nasopharyngeal swab, presence of viral mutation(s) within the  areas targeted by this assay, and inadequate number of viral copies  (<250 copies / mL). A negative result must be combined with clinical  observations, patient history, and epidemiological information. If result is POSITIVE SARS-CoV-2 target nucleic acids are DETECTED. The SARS-CoV-2 RNA is generally detectable in upper and lower  respiratory specimens dur ing the acute phase of infection.  Positive  results are indicative of active infection with SARS-CoV-2.  Clinical  correlation with patient history and other diagnostic information is  necessary to determine patient infection status.  Positive results do  not rule out bacterial infection or co-infection with other viruses. If result is PRESUMPTIVE POSTIVE SARS-CoV-2 nucleic acids MAY BE PRESENT.   A presumptive positive result was obtained on the submitted specimen  and confirmed on repeat testing.  While 2019 novel coronavirus  (SARS-CoV-2) nucleic acids may be present in the submitted sample   additional confirmatory testing may be necessary for epidemiological  and / or clinical management purposes  to differentiate between  SARS-CoV-2 and other Sarbecovirus currently known to infect humans.  If clinically indicated additional testing with an alternate test  methodology [phone]) is advised. The SARS-CoV-2 RNA is generally  detectable in upper and lower respiratory sp ecimens during the acute  phase of infection. The expected result is Negative. Fact Sheet for Patients:  BoilerBrush.com.cy Fact Sheet for Healthcare Providers: https://pope.com/ This test is not yet approved or cleared by the [place] FDA and has been authorized for detection and/or diagnosis of SARS-CoV-2 by FDA under an Emergency Use Authorization (EUA).  This EUA will remain in effect (meaning this test can be used) for the duration of the COVID-19 declaration under Section 564(b)(1) of the Act, 21 U.S.C. section 360bbb-3(b)(1), unless the authorization is terminated or revoked sooner. Performed at [facility], [address] [place], [place] [address]   Blood culture (routine x 2)     Status: None (Preliminary result)   Collection Time: 04/22/19 10:05 PM  Result Value Ref Range Status   Specimen Description BLOOD RIGHT ARM  Final   Special Requests   Final    BOTTLES DRAWN AEROBIC AND ANAEROBIC Blood Culture adequate volume   Culture   Final    NO GROWTH 4 DAYS Performed at [facility], [address] [place], [place] [address]    Report Status PENDING  Incomplete  Blood culture (routine x 2)     Status: None (Preliminary result)   Collection Time: 04/22/19 10:25 PM  Result Value Ref Range Status   Specimen Description BLOOD LEFT WRIST  Final   Special Requests   Final    BOTTLES DRAWN AEROBIC ONLY Blood Culture results may not be optimal due to an inadequate volume of blood received in culture bottles   Culture   Final    NO  GROWTH 4 DAYS Performed at [facility], [address] [place], [place] [address]    Report Status PENDING  Incomplete  Urine culture     Status: None   Collection Time: 04/22/19 11:29 PM  Result Value Ref Range Status   Specimen Description URINE, CATHETERIZED  Final   Special Requests NONE  Final  Culture   Final    NO GROWTH Performed at [facility], [address], [place], [place] [address]    Report Status 04/24/2019 FINAL  Final         Radiology Studies: No results found.      Scheduled Meds: . aspirin EC  81 mg Oral Daily  . atorvastatin  20 mg Oral QHS  . divalproex  500 mg Oral Q12H  . [START ON 04/27/2019] DULoxetine  60 mg Oral Daily  . ferrous sulfate  325 mg Oral Q M,W,F  . mouth rinse  15 mL Mouth Rinse BID  . multivitamin with minerals  1 tablet Oral Daily  . pantoprazole  40 mg Oral Daily  . propranolol ER  60 mg Oral Daily  . Ensure Max Protein  11 oz Oral Daily  . tamsulosin  0.4 mg Oral QHS  . umeclidinium bromide  1 puff Inhalation Daily   Continuous Infusions:    LOS: 2 days    Time spent: 32  minutes.     Kathlen Mody, MD [facility] Pager [phone]  If 7PM-7AM, please contact night-coverage [email or web address] Password [facility] 04/26/2019, 12:09 PM

## 2019-04-26 NOTE — Evaluation (Signed)
Physical Therapy Evaluation Patient Details Name: Alan Brown MRN: [id number] DOB: 06/01/1951 Today's Date: 04/26/2019   History of Present Illness  Alan Brown is a 68 y.o. male with history of chronic pain, depression and tremors was brought to the ER the patient's family noticed that patient was increasingly altered in mental status. PMH: bipolar 1 disorder, polysubstance dependence/daily use, acute resp failure with hypoxia.  Clinical Impression  Pt admitted with above. Pt was amb with cane PTA but now requires RW and assist for safe ambulation. Pt with decreased insight to deficits and safety saying, "I will not use a RW." Pt also states that his wife will be taking over his medications. Pt's wife home to provide 24/7. Pt to benefit from HHPT to address impaired balance and decreased activity tolerance. Acute PT to cont to follow.    Follow Up Recommendations Home health PT;Supervision/Assistance - 24 hour    Equipment Recommendations  Rolling walker with 5" wheels(despite patient saying "i will not use it")    Recommendations for Other Services       Precautions / Restrictions Precautions Precautions: Fall Precaution Comments: confusion Restrictions Weight Bearing Restrictions: No      Mobility  Bed Mobility               General bed mobility comments: pt standing at sink with OT  Transfers Overall transfer level: Needs assistance Equipment used: Rolling walker (2 wheeled) Transfers: Sit to/from Stand Sit to Stand: Min assist         General transfer comment: max directional verbal cues for safe backing up to chair and reaching back for arm rest of chair and to control descent  Ambulation/Gait Ambulation/Gait assistance: Min assist Gait Distance (Feet): 150 Feet Assistive device: Rolling walker (2 wheeled) Gait Pattern/deviations: Step-through pattern;Decreased stride length;Wide base of support Gait velocity: slow Gait velocity  interpretation: 1.31 - 2.62 ft/sec, indicative of limited community ambulator General Gait Details: pt with short shufffled steps and vearing to the R. Pt requiring verbal cues to amb in straight line  Stairs            Wheelchair Mobility    Modified Rankin (Stroke Patients Only)       Balance Overall balance assessment: Needs assistance         Standing balance support: Bilateral upper extremity supported Standing balance-Leahy Scale: Poor Standing balance comment: dependent on RW                             Pertinent Vitals/Pain Pain Assessment: No/denies pain    Home Living Family/patient expects to be discharged to:: Private residence Living Arrangements: Spouse/significant other Available Help at Discharge: Family;Available 24 hours/day Type of Home: House Home Access: Stairs to enter Entrance Stairs-Rails: (+ rail) Secretary/administratorntrance Stairs-Number of Steps: 2-3 Home Layout: One level Home Equipment: Cane - single point      Prior Function Level of Independence: Independent with assistive device(s)         Comments: uses cane     Hand Dominance   Dominant Hand: Right    Extremity/Trunk Assessment   Upper Extremity Assessment Upper Extremity Assessment: Defer to OT evaluation    Lower Extremity Assessment Lower Extremity Assessment: Generalized weakness       Communication   Communication: Expressive difficulties(word finding difficulty)  Cognition Arousal/Alertness: Awake/alert Behavior During Therapy: Impulsive Overall Cognitive Status: No family/caregiver present to determine baseline cognitive functioning  General Comments: pt with word finding difficulties, pt with decreased insight to deficits and safety, pt with decreased attn span, easily distracted, and poor short term memory      General Comments General comments (skin integrity, edema, etc.): vss    Exercises      Assessment/Plan    PT Assessment Patient needs continued PT services  PT Problem List Decreased strength;Decreased range of motion;Decreased activity tolerance;Decreased balance;Decreased mobility;Decreased coordination;Decreased cognition;Decreased knowledge of use of DME;Decreased safety awareness       PT Treatment Interventions DME instruction;Gait training;Stair training;Functional mobility training;Therapeutic activities;Therapeutic exercise;Balance training    PT Goals (Current goals can be found in the Care Plan section)  Acute Rehab PT Goals Patient Stated Goal: home PT Goal Formulation: With patient Time For Goal Achievement: 05/10/19 Potential to Achieve Goals: Good    Frequency Min 4X/week   Barriers to discharge        Co-evaluation               AM-PAC PT "6 Clicks" Mobility  Outcome Measure Help needed turning from your back to your side while in a flat bed without using bedrails?: A Little Help needed moving from lying on your back to sitting on the side of a flat bed without using bedrails?: A Little Help needed moving to and from a bed to a chair (including a wheelchair)?: A Little Help needed standing up from a chair using your arms (e.g., wheelchair or bedside chair)?: A Little Help needed to walk in hospital room?: A Little Help needed climbing 3-5 steps with a railing? : A Lot 6 Click Score: 17    End of Session Equipment Utilized During Treatment: Gait belt Activity Tolerance: Patient tolerated treatment well Patient left: in chair;with call bell/phone within reach;with chair alarm set Nurse Communication: Mobility status PT Visit Diagnosis: Unsteadiness on feet (R26.81);Difficulty in walking, not elsewhere classified (R26.2);Muscle weakness (generalized) (M62.81)    Time: 6834-1962 PT Time Calculation (min) (ACUTE ONLY): 13 min   Charges:   PT Evaluation $PT Eval Moderate Complexity: 1 Mod          Lewis Shock, PT, DPT Acute  Rehabilitation Services Pager #: [phone] Office #: [phone]   Iona Hansen 04/26/2019, 1:09 PM

## 2019-04-26 NOTE — Evaluation (Signed)
Occupational Therapy Evaluation Patient Details Name: Alan Brown MRN: [id number] DOB: [birth year] Today's Date: 04/26/2019    History of Present Illness Alan Brown is a 68 y.o. male with history of chronic pain, depression and tremors was brought to the ER the patient's family noticed that patient was increasingly altered in mental status. PMH: bipolar 1 disorder, polysubstance dependence/daily use, acute resp failure with hypoxia.   Clinical Impression   PTA patient reports independent with mobility and self care, limited participation in IADLs but was taking completing med mgmt.  Patient admitted for above and limited by problem list below, including cognition, impaired balance, decreased activity tolerance, and generalized weakness.  Patient able to complete UB ADLs seated supervision, LB ADLs with min assist, transfers with min assist and grooming standing at sink with min guard assist.  Presents with decreased awareness to deficits, not oriented to time (date) with poor recall after reoriented, has difficulty with word finding/perseverates on topics and requires constant redirection to tasks. Pt is aware of difficulty with med mgmt and reports his spouse will be taking over the manage of medication.  Pt will benefit from continued OT services while admitted and after dc at [facility] level in order to maximize safety and independence with ADls. Will follow.     Follow Up Recommendations  Home health OT;Supervision/Assistance - 24 hour    Equipment Recommendations  3 in 1 bedside commode    Recommendations for Other Services       Precautions / Restrictions Precautions Precautions: Fall Precaution Comments: confusion Restrictions Weight Bearing Restrictions: No      Mobility Bed Mobility               General bed mobility comments: OOB in chair upon entry   Transfers Overall transfer level: Needs assistance Equipment used: Rolling walker (2  wheeled) Transfers: Sit to/from Stand Sit to Stand: Min assist         General transfer comment: min assist to power up into standing, for safety and balance; cueing for hand placement    Balance Overall balance assessment: Needs assistance Sitting-balance support: No upper extremity supported;Feet supported Sitting balance-Leahy Scale: Fair     Standing balance support: No upper extremity supported;During functional activity Standing balance-Leahy Scale: Poor Standing balance comment: standing at sink with 0 hand support with min guard, during mobility reliant on RW                           ADL either performed or assessed with clinical judgement   ADL Overall ADL's : Needs assistance/impaired     Grooming: Min guard;Standing   Upper Body Bathing: Set up;Sitting   Lower Body Bathing: Minimal assistance;Sit to/from stand   Upper Body Dressing : Sitting;Set up   Lower Body Dressing: Minimal assistance;Sit to/from stand   Toilet Transfer: Minimal assistance;Ambulation;RW   Toileting- Clothing Manipulation and Hygiene: Minimal assistance;Sit to/from stand       Functional mobility during ADLs: Min guard;Rolling walker General ADL Comments: limited by generalized weakness and impaired cognition      Vision   Additional Comments: appears [facility]     Perception     Praxis      Pertinent Vitals/Pain Pain Assessment: No/denies pain     Hand Dominance Right   Extremity/Trunk Assessment Upper Extremity Assessment Upper Extremity Assessment: Generalized weakness;RUE deficits/detail RUE Deficits / Details: reports R shoulder pain and plan for surgery at some point-- limited by pain  Lower Extremity Assessment Lower Extremity Assessment: Defer to PT evaluation       Communication Communication Communication: Expressive difficulties(word finding difficulty)   Cognition Arousal/Alertness: Awake/alert Behavior During Therapy: Impulsive Overall  Cognitive Status: Impaired/Different from baseline Area of Impairment: Orientation;Attention;Memory;Following commands;Safety/judgement;Awareness;Problem solving                 Orientation Level: Disoriented to;Time Current Attention Level: Sustained Memory: Decreased recall of precautions;Decreased short-term memory Following Commands: Follows one step commands consistently;Follows multi-step commands inconsistently;Follows one step commands with increased time Safety/Judgement: Decreased awareness of safety;Decreased awareness of deficits Awareness: Emergent Problem Solving: Slow processing;Requires verbal cues General Comments: pt with word finding difficulties, pt with decreased insight to deficits and safety, pt with decreased attn span, easily distracted, and poor short term memory   General Comments  VSS    Exercises     Shoulder Instructions      Home Living Family/patient expects to be discharged to:: Private residence Living Arrangements: Spouse/significant other Available Help at Discharge: Family;Available 24 hours/day Type of Home: House Home Access: Stairs to enter Entergy CorporationEntrance Stairs-Number of Steps: 2-3 Entrance Stairs-Rails: (+ rail) Home Layout: One level     Bathroom Shower/Tub: Chief Strategy OfficerTub/shower unit   Bathroom Toilet: Standard     Home Equipment: The ServiceMaster CompanyCane - single point          Prior Functioning/Environment Level of Independence: Independent with assistive device(s)        Comments: uses cane        OT Problem List:        OT Treatment/Interventions:      OT Goals(Current goals can be found in the care plan section) Acute Rehab OT Goals Patient Stated Goal: to get home   OT Frequency: Min 2X/week   Barriers to D/C:            Co-evaluation              AM-PAC OT "6 Clicks" Daily Activity     Outcome Measure Help from another person eating meals?: None Help from another person taking care of personal grooming?: A Little Help  from another person toileting, which includes using toliet, bedpan, or urinal?: A Little Help from another person bathing (including washing, rinsing, drying)?: A Little Help from another person to put on and taking off regular upper body clothing?: A Little Help from another person to put on and taking off regular lower body clothing?: A Little 6 Click Score: 19   End of Session Equipment Utilized During Treatment: Gait belt;Rolling walker  Activity Tolerance: Patient tolerated treatment well Patient left: Other (comment)(handoff to PT )  OT Visit Diagnosis: Muscle weakness (generalized) (M62.81);Unsteadiness on feet (R26.81)                Time: 1113-1140 OT Time Calculation (min): 27 min Charges:  OT General Charges $OT Visit: 1 Visit OT Evaluation $OT Eval Moderate Complexity: 1 Mod OT Treatments $Self Care/Home Management : 8-22 mins  Chancy Milroyhristie S Aniesa Boback, OT Acute Rehabilitation Services Pager [phone] Office [phone]   Chancy MilroyChristie S Keyry Iracheta 04/26/2019, 2:49 PM

## 2019-04-26 NOTE — Progress Notes (Signed)
Medications stored in pharmacy  Duloxetine HCL 60 mg 85 count, medicine and papertaken done to pharmacy by Charge Nurse Eugenie Norrie

## 2019-04-27 DIAGNOSIS — F329 Major depressive disorder, single episode, unspecified: Secondary | ICD-10-CM

## 2019-04-27 LAB — BASIC METABOLIC PANEL
Anion gap: 10 (ref 5–15)
BUN: 18 mg/dL (ref 8–23)
CO2: 22 mmol/L (ref 22–32)
Calcium: 9.2 mg/dL (ref 8.9–10.3)
Chloride: 104 mmol/L (ref 98–111)
Creatinine, Ser: 1.37 mg/dL — ABNORMAL HIGH (ref 0.61–1.24)
GFR calc Af Amer: >60 mL/min (ref >60)
GFR calc non Af Amer: 52 mL/min — ABNORMAL LOW (ref >60)
Glucose, Bld: 110 mg/dL — ABNORMAL HIGH (ref 70–99)
Potassium: 3.7 mmol/L (ref 3.5–5.1)
Sodium: 136 mmol/L (ref 135–145)

## 2019-04-27 LAB — CULTURE, BLOOD (ROUTINE X 2)
Culture: NO GROWTH
Culture: NO GROWTH
Special Requests: ADEQUATE

## 2019-04-27 LAB — GLUCOSE, CAPILLARY
Glucose-Capillary: 106 mg/dL — ABNORMAL HIGH (ref 70–99)
Glucose-Capillary: 111 mg/dL — ABNORMAL HIGH (ref 70–99)

## 2019-04-27 MED ORDER — DIVALPROEX SODIUM 500 MG PO DR TAB: 500.0000 mg | DELAYED_RELEASE_TABLET | Freq: Two times a day (BID) | ORAL | 0 refills | Status: DC

## 2019-04-27 MED ORDER — RISPERIDONE 0.5 MG PO TABS: 0.5000 mg | ORAL_TABLET | Freq: Every evening | ORAL | 0 refills | Status: DC | PRN

## 2019-04-27 MED FILL — DIVALPROEX SOD DR 500 MG TA: 500 | 30 days supply | Qty: 60 | Fill #0

## 2019-04-27 MED FILL — risperiDONE 0.5 MG TABS: 0.5 | 30 days supply | Qty: 30 | Fill #0

## 2019-04-27 NOTE — Discharge Summary (Signed)
Physician Discharge Summary  Ikenna Ohms AOZ:[id number] DOB: 1951-07-03 DOA: 04/22/2019  PCP: Clovis Riley, L.August Saucer, MD  Admit date: 04/22/2019 Discharge date: 04/27/2019  Admitted From: Home Disposition: Home  Recommendations for Outpatient Follow-up:  1. Follow up with PCP in 1-2 weeks 2. Please obtain BMP/CBC in one week your next doctors visit.  3. Decrease Depakote from 1500 mg at bedtime to 500 mg twice daily, continue Cymbalta 60 mg daily 4. Discontinue BuSpar and Atarax 5. Change Risperdal 0.5 mg daily to 0.5 mg daily as needed for anger and irritability 6. Follow-up outpatient with psychiatry in about 3-4 weeks  Home Health: None Equipment/Devices: None Discharge Condition: Stable CODE STATUS: Full code Diet recommendation: Regular  Brief/Interim Summary: 68 year old with history of chronic pain, depression and tremors came to the hospital for change in mental status.  Initially due to hypercarbia and encephalopathy he required BiPAP but over several hours he was transitioned to nasal cannula and weaned off oxygen.  Extensive work-up performed in the hospital was negative including EEG, MRI of the brain, CTA of the chest.  It was suspected secondary to polypharmacy, medication was allowed to be washed out.  Psychiatry was consulted who recommended decreasing Depakote to 5 mg twice daily, discontinuing BuSpar and Atarax.  Changing Risperdal from daily to as needed.  Continuing Cymbalta. PT OT recommended home health therefore arrangements were made. He is stable for discharge with outpatient follow-up recommendations as stated above   Discharge Diagnoses:  Principal Problem:   Altered mental status Active Problems:   Acute encephalopathy   Bipolar 1 disorder (HCC)   Depression   Polysubstance (including opioids) dependence, daily use (HCC)   Chronic pain syndrome   Acute respiratory failure with hypoxia (HCC)  Acute metabolic encephalopathy secondary to hypercarbic  respiratory failure from polypharmacy -This is resolved.  Initially required BiPAP but eventually transitioned to nasal cannula and off oxygen on room air.  Extensive work-up including EEG and MRI of the brain was negative.  CTA of the chest was also negative.  Medications were allowed to be washed out and his mentation significantly improved and returned to baseline. - 12, TSH, RPR, ammonia level-negative  History of bipolar disorder with chronic pain -Psychiatry consulted who recommended changing Depakote from 1500 mg at bedtime to 500 mg twice daily, change Risperdal from bedtime daily to as needed for irritability and anger, continue Cymbalta 60 mg daily.  Discontinue BuSpar and Atarax and follow-up outpatient  Acute kidney injury, resolved Essential hypertension-resume home medication History of COPD-resume home inhaler, appears to be stable  On SCDs for DVT prophylaxis while here.  Patient is a full code I spoke with the patient's daughter [name] over the phone on the day of discharge and answered all the questions  Consultations:  Neurology  Psychiatry  Subjective: Feels better, mentation appears to be back to his baseline and able to carry on conversation.  No complaints.  Denies any suicidal or homicidal ideation.  Discharge Exam: Vitals:   04/26/19 2344 04/27/19 0338  BP: (!) 145/83 (!) 142/85  Pulse: (!) 59 60  Resp: 19 17  Temp: 98.9 F (37.2 C) 98.7 F (37.1 C)  SpO2: 94% 96%   Vitals:   04/26/19 1938 04/26/19 2020 04/26/19 2344 04/27/19 0338  BP: 131/84  (!) 145/83 (!) 142/85  Pulse: 62  (!) 59 60  Resp: 18  19 17   Temp: 98 F (36.7 C)  98.9 F (37.2 C) 98.7 F (37.1 C)  TempSrc: Oral  Oral Oral  SpO2: 96%  94% 96%  Weight:  100 kg  93.6 kg  Height:  5\' 10"  (1.778 m)      General: Pt is alert, awake, not in acute distress Cardiovascular: RRR, S1/S2 +, no rubs, no gallops Respiratory: CTA bilaterally, no wheezing, no rhonchi Abdominal: Soft, NT, ND,  bowel sounds + Extremities: no edema, no cyanosis  Discharge Instructions  Discharge Instructions    Diet - low sodium heart healthy   Complete by:  As directed    Increase activity slowly   Complete by:  As directed      Allergies as of 04/27/2019      Reactions   Naproxen Other (See Comments)   Increases lithium toxicity.    Gabapentin Other (See Comments)   Unknown reaction   Morphine And Related Other (See Comments)   Inability to function/ disorientation      Medication List    STOP taking these medications   busPIRone 15 MG tablet Commonly known as:  BUSPAR   divalproex 500 MG 24 hr tablet Commonly known as:  DEPAKOTE ER Replaced by:  divalproex 500 MG DR tablet   hydrOXYzine 25 MG capsule Commonly known as:  VISTARIL     TAKE these medications   albuterol 108 (90 Base) MCG/ACT inhaler Commonly known as:  VENTOLIN HFA Inhale 2 puffs into the lungs 4 (four) times daily as needed for wheezing or shortness of breath.   aspirin EC 81 MG tablet Take 81 mg by mouth daily.   atorvastatin 20 MG tablet Commonly known as:  LIPITOR Take 20 mg by mouth at bedtime.   Calcium 600+D 600-400 MG-UNIT tablet Generic drug:  Calcium Carbonate-Vitamin D Take 1 tablet by mouth 2 (two) times daily.   diclofenac sodium 1 % Gel Commonly known as:  VOLTAREN Apply 1 application topically 3 (three) times daily as needed (pain).   divalproex 500 MG DR tablet Commonly known as:  DEPAKOTE Take 1 tablet (500 mg total) by mouth every 12 (twelve) hours for 30 days. Replaces:  divalproex 500 MG 24 hr tablet   DULoxetine 60 MG capsule Commonly known as:  CYMBALTA Take 60 mg by mouth daily. For mood and pain   ferrous sulfate 325 (65 FE) MG tablet Take 325 mg by mouth every Monday, Wednesday, and Friday.   HYDROcodone-acetaminophen 5-325 MG tablet Commonly known as:  NORCO/VICODIN Take 1 tablet by mouth 3 (three) times daily as needed (pain).   hydrocortisone 2.5 %  cream Apply 1 application topically 2 (two) times daily.   methocarbamol 500 MG tablet Commonly known as:  ROBAXIN Take 1,000 mg by mouth 2 (two) times daily.   multivitamin with minerals Tabs tablet Take 1 tablet by mouth daily.   Nucynta 100 MG Tabs Generic drug:  Tapentadol HCl Take 100 mg by mouth 4 (four) times daily as needed (PAIN).   omeprazole 40 MG capsule Commonly known as:  PRILOSEC Take 40 mg by mouth 2 (two) times daily before a meal.   oxyCODONE-acetaminophen 10-325 MG tablet Commonly known as:  PERCOCET Take 1 tablet by mouth 4 (four) times daily as needed for pain.   pregabalin 300 MG capsule Commonly known as:  LYRICA Take 300 mg by mouth at bedtime.   primidone 50 MG tablet Commonly known as:  MYSOLINE Take 50 mg by mouth 3 (three) times daily. For tremors   propranolol ER 60 MG 24 hr capsule Commonly known as:  INDERAL LA Take 60 mg by mouth daily.  risperiDONE 0.5 MG tablet Commonly known as:  RISPERDAL Take 1 tablet (0.5 mg total) by mouth at bedtime as needed for up to 30 days (for agitation). What changed:    when to take this  reasons to take this  additional instructions   Spiriva Respimat 2.5 MCG/ACT Aers Generic drug:  Tiotropium Bromide Monohydrate Inhale 2 puffs into the lungs daily.   tamsulosin 0.4 MG Caps capsule Commonly known as:  FLOMAX Take 0.4 mg by mouth at bedtime.   traMADol 50 MG tablet Commonly known as:  ULTRAM Take 1 tablet by mouth 4 (four) times daily as needed (pain).   traZODone 100 MG tablet Commonly known as:  DESYREL Take 100 mg by mouth at bedtime as needed for sleep.   triamcinolone cream 0.5 % Commonly known as:  KENALOG Apply 1 application topically 2 (two) times daily as needed (RASH).      Follow-up Information    Clovis Riley, L.August Saucer, MD. Schedule an appointment as soon as possible for a visit in 1 week(s).   Specialty:  Family Medicine Contact information: [address] [place] [address] [phone]          Allergies  Allergen Reactions  . Naproxen Other (See Comments)    Increases lithium toxicity.   . Gabapentin Other (See Comments)    Unknown reaction  . Morphine And Related Other (See Comments)    Inability to function/ disorientation    You were cared for by a hospitalist during your hospital stay. If you have any questions about your discharge medications or the care you received while you were in the hospital after you are discharged, you can call the unit and asked to speak with the hospitalist on call if the hospitalist that took care of you is not available. Once you are discharged, your primary care physician will handle any further medical issues. Please note that no refills for any discharge medications will be authorized once you are discharged, as it is imperative that you return to your primary care physician (or establish a relationship with a primary care physician if you do not have one) for your aftercare needs so that they can reassess your need for medications and monitor your lab values.   Procedures/Studies: Ct Head Wo Contrast  Result Date: 04/22/2019 CLINICAL DATA:  Altered level of consciousness EXAM: CT HEAD WITHOUT CONTRAST TECHNIQUE: Contiguous axial images were obtained from the base of the skull through the vertex without intravenous contrast. COMPARISON:  01/23/2014 FINDINGS: Brain: No evidence of acute infarction, hemorrhage, hydrocephalus, extra-axial collection or mass lesion/mass effect. Vascular: No hyperdense vessel or unexpected calcification. Skull: Normal. Negative for fracture or focal lesion. Sinuses/Orbits: No acute finding. Other: None. IMPRESSION: No acute intracranial abnormality noted. Electronically Signed   By: Alcide Clever M.D.   On: 04/22/2019 22:09   Ct Angio Chest Pe W And/or Wo Contrast  Result Date: 04/23/2019 CLINICAL DATA:  68 year old male with liver G. Concern for pulmonary embolism.  EXAM: CT ANGIOGRAPHY CHEST WITH CONTRAST TECHNIQUE: Multidetector CT imaging of the chest was performed using the standard protocol during bolus administration of intravenous contrast. Multiplanar CT image reconstructions and MIPs were obtained to evaluate the vascular anatomy. CONTRAST:  OMNIPAQUE IOHEXOL 350 MG/ML SOLN COMPARISON:  Chest radiograph dated 04/22/2019 FINDINGS: Cardiovascular: Mild distention of the right atrium. There is no pericardial effusion. Coronary vascular calcification involving the LAD. There is mild atherosclerotic calcification of the aortic arch. No CT evidence of pulmonary embolism. Mediastinum/Nodes:  Mildly enlarged right hilar lymph node measures 16 mm. No mediastinal adenopathy. The esophagus and the thyroid gland are grossly unremarkable. No mediastinal fluid collection. Lungs/Pleura: There are bilateral lower lobe linear atelectasis/scarring. Mild lower lobe bronchiectatic changes noted. There is no focal consolidation, pleural effusion, or pneumothorax. The central airways are patent. Mucus secretions noted within the trachea. Upper Abdomen: No acute abnormality. Musculoskeletal: No chest wall abnormality. No acute or significant osseous findings. Review of the MIP images confirms the above findings. IMPRESSION: No acute intrathoracic pathology. No CT evidence of pulmonary embolism. Electronically Signed   By: Elgie CollardArash  Radparvar M.D.   On: 04/23/2019 00:22   Mr Brain Wo Contrast  Result Date: 04/24/2019 CLINICAL DATA:  68 y/o M; altered mental status, lethargy, difficulty ambulating. EXAM: MRI HEAD WITHOUT CONTRAST TECHNIQUE: Multiplanar, multiecho pulse sequences of the brain and surrounding structures were obtained without intravenous contrast. COMPARISON:  04/22/2019 CT head.  01/23/2014 MRI head. FINDINGS: Brain: No acute infarction, hemorrhage, hydrocephalus, extra-axial collection or mass lesion. Stable mild chronic microvascular ischemic changes and volume loss of  the brain. Vascular: Normal flow voids. Skull and upper cervical spine: Normal marrow signal. Sinuses/Orbits: Negative. Other: None. IMPRESSION: 1. No acute intracranial abnormality identified. 2. Stable mild chronic microvascular ischemic changes and volume loss of the brain. Electronically Signed   By: Mitzi HansenLance  Furusawa-Stratton M.D.   On: 04/24/2019 01:30   Dg Chest Portable 1 View  Result Date: 04/22/2019 CLINICAL DATA:  68 year old male with altered mental status. EXAM: PORTABLE CHEST 1 VIEW COMPARISON:  Chest radiograph dated 03/30/2019 FINDINGS: There is shallow inspiration with bibasilar atelectasis. Mild chronic bronchitic changes. Mild eventration of the right hemidiaphragm. No focal consolidation, large pleural effusion, or pneumothorax. The cardiac silhouette is within normal limits. No acute osseous pathology. IMPRESSION: No active disease. Electronically Signed   By: Elgie CollardArash  Radparvar M.D.   On: 04/22/2019 21:32   Dg Chest Port 1 View  Result Date: 03/31/2019 CLINICAL DATA:  Low-grade fever and shortness of breath for several hours EXAM: PORTABLE CHEST 1 VIEW COMPARISON:  10/27/2018 FINDINGS: Cardiac shadow is stable. Aortic calcifications are again seen. Lungs are well aerated bilaterally without focal infiltrate or sizable effusion. No acute bony abnormality is noted. IMPRESSION: No acute abnormality noted. Electronically Signed   By: Alcide CleverMark  Lukens M.D.   On: 03/31/2019 00:27      The results of significant diagnostics from this hospitalization (including imaging, microbiology, ancillary and laboratory) are listed below for reference.     Microbiology: Recent Results (from the past 240 hour(s))  SARS Coronavirus 2 (CEPHEID- Performed [facility]), Hosp Order     Status: None   Collection Time: 04/22/19  9:31 PM  Result Value Ref Range Status   SARS Coronavirus 2 NEGATIVE NEGATIVE Final    Comment: (NOTE) If result is NEGATIVE SARS-CoV-2 target nucleic acids are NOT  DETECTED. The SARS-CoV-2 RNA is generally detectable in upper and lower  respiratory specimens during the acute phase of infection. The lowest  concentration of SARS-CoV-2 viral copies this assay can detect is 250  copies / mL. A negative result does not preclude SARS-CoV-2 infection  and should not be used as the sole basis for treatment or other  patient management decisions.  A negative result may occur with  improper specimen collection / handling, submission of specimen other  than nasopharyngeal swab, presence of viral mutation(s) within the  areas targeted by this assay, and inadequate number of viral copies  (<250 copies / mL).  A negative result must be combined with clinical  observations, patient history, and epidemiological information. If result is POSITIVE SARS-CoV-2 target nucleic acids are DETECTED. The SARS-CoV-2 RNA is generally detectable in upper and lower  respiratory specimens dur ing the acute phase of infection.  Positive  results are indicative of active infection with SARS-CoV-2.  Clinical  correlation with patient history and other diagnostic information is  necessary to determine patient infection status.  Positive results do  not rule out bacterial infection or co-infection with other viruses. If result is PRESUMPTIVE POSTIVE SARS-CoV-2 nucleic acids MAY BE PRESENT.   A presumptive positive result was obtained on the submitted specimen  and confirmed on repeat testing.  While 2019 novel coronavirus  (SARS-CoV-2) nucleic acids may be present in the submitted sample  additional confirmatory testing may be necessary for epidemiological  and / or clinical management purposes  to differentiate between  SARS-CoV-2 and other Sarbecovirus currently known to infect humans.  If clinically indicated additional testing with an alternate test  methodology [phone]) is advised. The SARS-CoV-2 RNA is generally  detectable in upper and lower respiratory sp ecimens during  the acute  phase of infection. The expected result is Negative. Fact Sheet for Patients:  BoilerBrush.com.cy Fact Sheet for Healthcare Providers: https://pope.com/ This test is not yet approved or cleared by the [place] FDA and has been authorized for detection and/or diagnosis of SARS-CoV-2 by FDA under an Emergency Use Authorization (EUA).  This EUA will remain in effect (meaning this test can be used) for the duration of the COVID-19 declaration under Section 564(b)(1) of the Act, 21 U.S.C. section 360bbb-3(b)(1), unless the authorization is terminated or revoked sooner. Performed at [facility], [address] [place], [place] [address]   Blood culture (routine x 2)     Status: None (Preliminary result)   Collection Time: 04/22/19 10:05 PM  Result Value Ref Range Status   Specimen Description BLOOD RIGHT ARM  Final   Special Requests   Final    BOTTLES DRAWN AEROBIC AND ANAEROBIC Blood Culture adequate volume   Culture   Final    NO GROWTH 4 DAYS Performed at [facility], [address], [place], [place] [address]    Report Status PENDING  Incomplete  Blood culture (routine x 2)     Status: None (Preliminary result)   Collection Time: 04/22/19 10:25 PM  Result Value Ref Range Status   Specimen Description BLOOD LEFT WRIST  Final   Special Requests   Final    BOTTLES DRAWN AEROBIC ONLY Blood Culture results may not be optimal due to an inadequate volume of blood received in culture bottles   Culture   Final    NO GROWTH 4 DAYS Performed at [facility], [address] [place], [place] [address]    Report Status PENDING  Incomplete  Urine culture     Status: None   Collection Time: 04/22/19 11:29 PM  Result Value Ref Range Status   Specimen Description URINE, CATHETERIZED  Final   Special Requests NONE  Final   Culture   Final    NO GROWTH Performed at [facility], [address],  [place], [place] [address]    Report Status 04/24/2019 FINAL  Final     Labs: BNP (last 3 results) Recent Labs    04/23/19 0103  BNP 34.2   Basic Metabolic Panel: Recent Labs  Lab 04/22/19 2114 04/22/19 2211 04/23/19 0121 04/23/19 2054  04/25/19 1107 04/27/19 0349  NA 140 140 139 137 140 136  K 4.4 3.8 4.0 4.2 3.2* 3.7  CL 100  --   --  103 107 104  CO2 29  --   --  24 21* 22  GLUCOSE 134*  --   --  128* 157* 110*  BUN 15  --   --  CREATININE 1.43*  --   --  1.11 1.10 1.37*  CALCIUM 8.7*  --   --  8.7* 9.1 9.2   Liver Function Tests: Recent Labs  Lab 04/22/19 2114 04/23/19 1015  AST 37 30  ALT 14 13  ALKPHOS 39 39  BILITOT 0.4 0.5  PROT 6.0* 6.0*  ALBUMIN 3.2* 3.0*   Recent Labs  Lab 04/22/19 2114  LIPASE 22   Recent Labs  Lab 04/22/19 2125  AMMONIA 20   CBC: Recent Labs  Lab 04/22/19 2114 04/22/19 2211 04/23/19 0121 04/23/19 1015 04/25/19 1107  WBC 7.4  --   --  6.0 7.4  NEUTROABS 4.1  --   --  3.4  --   HGB 12.4* 11.2* 12.2* 12.3* 14.0  HCT 38.8* 33.0* 36.0* 37.9* 41.3  MCV 95.1  --   --  95.0 89.6  PLT 148*  --   --  153 162   Cardiac Enzymes: Recent Labs  Lab 04/22/19 2114 04/23/19 0103 04/23/19 1015 04/23/19 2054  TROPONINI <0.03 <0.03 <0.03 <0.03   BNP: Invalid input(s): POCBNP CBG: Recent Labs  Lab 04/26/19 1538 04/26/19 1944 04/26/19 2342 04/27/19 0335 04/27/19 0739  GLUCAP 129* 126* 106* 106* 111*   D-Dimer No results for input(s): DDIMER in the last 72 hours. Hgb A1c No results for input(s): HGBA1C in the last 72 hours. Lipid Profile No results for input(s): CHOL, HDL, LDLCALC, TRIG, CHOLHDL, LDLDIRECT in the last 72 hours. Thyroid function studies No results for input(s): TSH, T4TOTAL, T3FREE, THYROIDAB in the last 72 hours.  Invalid input(s): FREET3 Anemia work up No results for input(s): VITAMINB12, FOLATE, FERRITIN, TIBC, IRON, RETICCTPCT in the last 72 hours. Urinalysis    Component Value Date/Time    COLORURINE YELLOW 04/23/2019 0610   APPEARANCEUR CLEAR 04/23/2019 0610   LABSPEC 1.040 (H) 04/23/2019 0610   PHURINE 6.0 04/23/2019 0610   GLUCOSEU NEGATIVE 04/23/2019 0610   HGBUR NEGATIVE 04/23/2019 0610   BILIRUBINUR NEGATIVE 04/23/2019 0610   KETONESUR 5 (A) 04/23/2019 0610   PROTEINUR NEGATIVE 04/23/2019 0610   UROBILINOGEN 0.2 01/23/2014 1505   NITRITE NEGATIVE 04/23/2019 0610   LEUKOCYTESUR NEGATIVE 04/23/2019 0610   Sepsis Labs Invalid input(s): PROCALCITONIN,  WBC,  LACTICIDVEN Microbiology Recent Results (from the past 240 hour(s))  SARS Coronavirus 2 (CEPHEID- Performed in [facility] hospital lab), Hosp Order     Status: None   Collection Time: 04/22/19  9:31 PM  Result Value Ref Range Status   SARS Coronavirus 2 NEGATIVE NEGATIVE Final    Comment: (NOTE) If result is NEGATIVE SARS-CoV-2 target nucleic acids are NOT DETECTED. The SARS-CoV-2 RNA is generally detectable in upper and lower  respiratory specimens during the acute phase of infection. The lowest  concentration of SARS-CoV-2 viral copies this assay can detect is 250  copies / mL. A negative result does not preclude SARS-CoV-2 infection  and should not be used as the sole basis for treatment or other  patient management decisions.  A negative result may occur with  improper specimen collection / handling, submission of specimen other  than nasopharyngeal swab,  presence of viral mutation(s) within the  areas targeted by this assay, and inadequate number of viral copies  (<250 copies / mL). A negative result must be combined with clinical  observations, patient history, and epidemiological information. If result is POSITIVE SARS-CoV-2 target nucleic acids are DETECTED. The SARS-CoV-2 RNA is generally detectable in upper and lower  respiratory specimens dur ing the acute phase of infection.  Positive  results are indicative of active infection with SARS-CoV-2.  Clinical  correlation with patient history  and other diagnostic information is  necessary to determine patient infection status.  Positive results do  not rule out bacterial infection or co-infection with other viruses. If result is PRESUMPTIVE POSTIVE SARS-CoV-2 nucleic acids MAY BE PRESENT.   A presumptive positive result was obtained on the submitted specimen  and confirmed on repeat testing.  While 2019 novel coronavirus  (SARS-CoV-2) nucleic acids may be present in the submitted sample  additional confirmatory testing may be necessary for epidemiological  and / or clinical management purposes  to differentiate between  SARS-CoV-2 and other Sarbecovirus currently known to infect humans.  If clinically indicated additional testing with an alternate test  methodology [phone]) is advised. The SARS-CoV-2 RNA is generally  detectable in upper and lower respiratory sp ecimens during the acute  phase of infection. The expected result is Negative. Fact Sheet for Patients:  BoilerBrush.com.cy Fact Sheet for Healthcare Providers: https://pope.com/ This test is not yet approved or cleared by the [place] FDA and has been authorized for detection and/or diagnosis of SARS-CoV-2 by FDA under an Emergency Use Authorization (EUA).  This EUA will remain in effect (meaning this test can be used) for the duration of the COVID-19 declaration under Section 564(b)(1) of the Act, 21 U.S.C. section 360bbb-3(b)(1), unless the authorization is terminated or revoked sooner. Performed at [facility], [address], [place], [place] [address]   Blood culture (routine x 2)     Status: None (Preliminary result)   Collection Time: 04/22/19 10:05 PM  Result Value Ref Range Status   Specimen Description BLOOD RIGHT ARM  Final   Special Requests   Final    BOTTLES DRAWN AEROBIC AND ANAEROBIC Blood Culture adequate volume   Culture   Final    NO GROWTH 4 DAYS Performed at [facility]  Lab, [address] [place], [place] [address]    Report Status PENDING  Incomplete  Blood culture (routine x 2)     Status: None (Preliminary result)   Collection Time: 04/22/19 10:25 PM  Result Value Ref Range Status   Specimen Description BLOOD LEFT WRIST  Final   Special Requests   Final    BOTTLES DRAWN AEROBIC ONLY Blood Culture results may not be optimal due to an inadequate volume of blood received in culture bottles   Culture   Final    NO GROWTH 4 DAYS Performed at [facility], [address], [place], [place] [address]    Report Status PENDING  Incomplete  Urine culture     Status: None   Collection Time: 04/22/19 11:29 PM  Result Value Ref Range Status   Specimen Description URINE, CATHETERIZED  Final   Special Requests NONE  Final   Culture   Final    NO GROWTH Performed at [facility], [address] [address], [place], [place] [address]    Report Status 04/24/2019 FINAL  Final     Time coordinating discharge:  I have spent 35 minutes  face to face with the patient and on the ward discussing the patients care, assessment, plan and disposition with other care givers. >50% of the time was devoted counseling the patient about the risks and benefits of treatment/Discharge disposition and coordinating care.   SIGNED:   Dimple Nanas, MD  [facility] 04/27/2019, 10:15 AM   If 7PM-7AM, please contact night-coverage [email or web address]

## 2019-04-27 NOTE — Care Management Important Message (Signed)
Important Message  Patient Details  Name: Alan Brown MRN: [id number] Date of Birth: 1951-08-18   Medicare Important Message Given:  Yes    Dorena Bodo 04/27/2019, 2:29 PM

## 2019-04-27 NOTE — Progress Notes (Signed)
RN went to pick medications from pharmacy, pt received Duloxetine 60 MG. Medication was given to pt and his wife.

## 2019-04-27 NOTE — TOC Transition Note (Signed)
Transition of Care [facility]) - CM/SW Discharge Note   Patient Details  Name: Mavryck Cogburn MRN: [id number] Date of Birth: [birth year]  [facility] CM/SW Contact:  Kermit Balo, RN Phone Number: 04/27/2019, 11:34 AM   Clinical Narrative:    Pt discharging home with resumption of HH services. CM notified Lupita Leash with [facility] of d/c.  CM spoke to pts wife. She will provide transport home. She is also going to get his shower seat from his apartment and bring to the house.  Wife states she will be handling his medications at home.    Final next level of care: Home w Home Health Services Barriers to Discharge: Barriers Resolved   Patient Goals and CMS Choice Patient states their goals for this hospitalization and ongoing recovery are:: to return home CMS Medicare.gov Compare Post Acute Care list provided to:: Patient Choice offered to / list presented to : Patient  Discharge Placement                       Discharge Plan and Services   Discharge Planning Services: CM Consult Post Acute Care Choice: Durable Medical Equipment          DME Arranged: Dan Humphreys rolling DME Agency: [employer] Date DME Agency Contacted: 04/27/19 Time DME Agency Contacted: 1114 Representative spoke with at DME Agency: Zack HH Arranged: PT, OT HH Agency: [facility] Date HH Agency Contacted: 04/27/19 Time HH Agency Contacted: 1018 Representative spoke with at [facility] Agency: Lupita Leash  Social Determinants of Health (SDOH) Interventions     Readmission Risk Interventions No flowsheet data found.

## 2019-04-27 NOTE — Progress Notes (Signed)
Physical Therapy Treatment Patient Details Name: Alan Brown MRN: [id number] DOB: 29-May-1951 Today's Date: 04/27/2019    History of Present Illness Alan Brown is a 68 y.o. male with history of chronic pain, depression and tremors was brought to the ER the patient's family noticed that patient was increasingly altered in mental status. PMH: bipolar 1 disorder, polysubstance dependence/daily use, acute resp failure with hypoxia.    PT Comments    Session today focused on therapeutic activity of gait, and transfers to chair as well as sit to stands without using RW. He was able to perform standing balance activities today without RW using intermittent UE support of counter however for safety RW is still recommended at this time for standing activities. He is progressing well and he may be discharged today. If he is not PT will continue to follow up with him.   Follow Up Recommendations  Home health PT;Supervision/Assistance - 24 hour     Equipment Recommendations  Rolling walker with 5" wheels(despite patient saying "i will not use it")    Recommendations for Other Services       Precautions / Restrictions Precautions Precautions: Fall Precaution Comments: confusion Restrictions Weight Bearing Restrictions: No    Mobility  Bed Mobility                  Transfers Overall transfer level: Modified independent Equipment used: Rolling walker (2 wheeled) Transfers: Sit to/from Stand Sit to Stand: Supervision            Ambulation/Gait Ambulation/Gait assistance: Min guard Gait Distance (Feet): 150 Feet Assistive device: Rolling walker (2 wheeled) Gait Pattern/deviations: Step-through pattern;Decreased stride length;Wide base of support Gait velocity: slow   General Gait Details: pt with short shufffled steps and vearing to the R. Pt requiring verbal cues to amb in straight line        Balance Overall balance assessment: Needs  assistance   Sitting balance-Leahy Scale: Good     Standing balance support: Bilateral upper extremity supported Standing balance-Leahy Scale: Poor Standing balance comment: dependent on RW for ambulation but could do some balance activities below with intermittent UE support needed      High Level Balance Comments: performed mod tandem stance, NBOS, and standing marches with the tandem and marching requiring intermittent UE support all were 30 sec X 2 standing at sink            Cognition Arousal/Alertness: Awake/alert Behavior During Therapy: Impulsive Overall Cognitive Status: No family/caregiver present to determine baseline cognitive functioning            General Comments: pt with word finding difficulties, pt with decreased insight to deficits and safety, pt with decreased attn span, easily distracted, and poor short term memory        PT Goals (current goals can now be found in the care plan section) Acute Rehab PT Goals Patient Stated Goal: home PT Goal Formulation: With patient Time For Goal Achievement: 05/10/19 Potential to Achieve Goals: Good Progress towards PT goals: Progressing toward goals    Frequency    Min 4X/week      PT Plan Current plan remains appropriate(appears to discharging home  today)       AM-PAC PT "6 Clicks" Mobility   Outcome Measure  Help needed turning from your back to your side while in a flat bed without using bedrails?: None Help needed moving from lying on your back to sitting on the side of a flat bed without using bedrails?:  None Help needed moving to and from a bed to a chair (including a wheelchair)?: A Little Help needed standing up from a chair using your arms (e.g., wheelchair or bedside chair)?: A Little Help needed to walk in hospital room?: A Little Help needed climbing 3-5 steps with a railing? : A Lot 6 Click Score: 19    End of Session Equipment Utilized During Treatment: Gait belt Activity  Tolerance: Patient tolerated treatment well Patient left: in chair;with call bell/phone within reach;with chair alarm set Nurse Communication: Mobility status PT Visit Diagnosis: Unsteadiness on feet (R26.81);Difficulty in walking, not elsewhere classified (R26.2);Muscle weakness (generalized) (M62.81)     Time: [id number] PT Time Calculation (min) (ACUTE ONLY): 23 min  Charges:  $Therapeutic Activity: 8-22 mins         $Neuromuscular Re-Ed 8-22 min                    Ivery QualeBrian Brody Kump, PT,DPT Acute Rehabilitation Services Office [phone]    April MansonBrian R Elber Galyean 04/27/2019, 10:34 AM

## 2019-04-27 NOTE — Plan of Care (Signed)
Adequate for discharge.

## 2019-04-27 NOTE — Progress Notes (Signed)
Occupational Therapy Treatment Patient Details Name: Alan Brown MRN: [id number] DOB: [birth year] Today's Date: 04/27/2019    History of present illness Alan Brown is a 68 y.o. male with history of chronic pain, depression and tremors was brought to the ER the patient's family noticed that patient was increasingly altered in mental status. PMH: bipolar 1 disorder, polysubstance dependence/daily use, acute resp failure with hypoxia.   OT comments  Patient progressing well.  Continues to require cueing to recall, safety and problem solving during functional tasks.  Used RW with min guard for safety during transfers, simulated tub transfers with min guard- reports has a shower chair at his apartment he will bring to his spouses home.  Short blessed cognitive test reveals scoring 10/28, significant impairments in memory and sequencing.  Continue to recommend HHOT services.    Follow Up Recommendations  Home health OT;Supervision/Assistance - 24 hour    Equipment Recommendations  3 in 1 bedside commode    Recommendations for Other Services      Precautions / Restrictions Precautions Precautions: Fall Precaution Comments: confusion Restrictions Weight Bearing Restrictions: No       Mobility Bed Mobility               General bed mobility comments: OOB in chair upon entry   Transfers Overall transfer level: Needs assistance Equipment used: Rolling walker (2 wheeled) Transfers: Sit to/from Stand Sit to Stand: Min guard         General transfer comment: min guard for safety, cueing for hand placement     Balance Overall balance assessment: Needs assistance Sitting-balance support: No upper extremity supported;Feet supported Sitting balance-Leahy Scale: Good     Standing balance support: During functional activity;No upper extremity supported Standing balance-Leahy Scale: Poor Standing balance comment: dependent on UE support dynamically, but able  to complete grooming tasks without UE support               High Level Balance Comments: performed mod tandem stance, NBOS, and standing marches with the tandem and marching requiring intermittent UE support all were 30 sec X 2 standing at sink           ADL either performed or assessed with clinical judgement   ADL Overall ADL's : Needs assistance/impaired     Grooming: Supervision/safety;Standing               Lower Body Dressing: Min guard;Sit to/from stand   Toilet Transfer: Min guard;Ambulation;RW Toilet Transfer Details (indicate cue type and reason): simulated in room     Tub/ Shower Transfer: Tub transfer;Min guard;Ambulation;3 in 1;Rolling walker Tub/Shower Transfer Details (indicate cue type and reason): reviewed technique, reports he will have his spouses support; used RW with reverse step  Functional mobility during ADLs: Min Tax inspectorguard;Rolling walker       Vision       Perception     Praxis      Cognition Arousal/Alertness: Awake/alert Behavior During Therapy: Impulsive Overall Cognitive Status: No family/caregiver present to determine baseline cognitive functioning Area of Impairment: Attention;Memory;Following commands;Safety/judgement;Awareness;Problem solving                   Current Attention Level: Sustained Memory: Decreased recall of precautions;Decreased short-term memory Following Commands: Follows one step commands consistently;Follows one step commands with increased time Safety/Judgement: Decreased awareness of safety;Decreased awareness of deficits Awareness: Emergent Problem Solving: Slow processing;Requires verbal cues General Comments: short blessed test completed: scoring 10/28 revealing impairments in short term memory, sequencing; pt  continues to demonstrate poor safety awareness and decresased attention         Exercises     Shoulder Instructions       General Comments      Pertinent Vitals/ Pain       Pain  Assessment: No/denies pain  Home Living                                          Prior Functioning/Environment              Frequency  Min 2X/week        Progress Toward Goals  OT Goals(current goals can now be found in the care plan section)  Progress towards OT goals: Progressing toward goals  Acute Rehab OT Goals Patient Stated Goal: home  Plan Discharge plan remains appropriate;Frequency remains appropriate    Co-evaluation                 AM-PAC OT "6 Clicks" Daily Activity     Outcome Measure   Help from another person eating meals?: None Help from another person taking care of personal grooming?: None Help from another person toileting, which includes using toliet, bedpan, or urinal?: A Little Help from another person bathing (including washing, rinsing, drying)?: A Little Help from another person to put on and taking off regular upper body clothing?: None Help from another person to put on and taking off regular lower body clothing?: A Little 6 Click Score: 21    End of Session Equipment Utilized During Treatment: Gait belt;Rolling walker  OT Visit Diagnosis: Muscle weakness (generalized) (M62.81);Unsteadiness on feet (R26.81)   Activity Tolerance Patient tolerated treatment well   Patient Left Other (comment)(handoff to PT )   Nurse Communication Mobility status        Time: 9767-3419 OT Time Calculation (min): 25 min  Charges: OT General Charges $OT Visit: 1 Visit OT Treatments $Self Care/Home Management : 23-37 mins  Chancy Milroy, OT Acute Rehabilitation Services Pager [phone] Office [phone]     Chancy Milroy 04/27/2019, 1:14 PM

## 2019-04-29 ENCOUNTER — Other Ambulatory Visit (HOSPITAL_COMMUNITY): Payer: Medicare Other

## 2019-05-20 ENCOUNTER — Telehealth: Payer: Self-pay | Admitting: Orthopaedic Surgery

## 2019-05-20 NOTE — Telephone Encounter (Signed)
See message below °

## 2019-05-20 NOTE — Telephone Encounter (Signed)
Patient called stating he is still having pain in his right shoulder and is requesting a return call to discuss surgery options.

## 2019-05-23 NOTE — Telephone Encounter (Signed)
Left voice mail

## 2019-05-24 NOTE — Telephone Encounter (Signed)
Patient called back returning Dr. Erlinda Hong call. Said that he only wanted to speak with Erlinda Hong

## 2019-05-24 NOTE — Telephone Encounter (Signed)
Called patient.  He wants to schedule surgery asap.  He says that the [place] has already authorized the surgery.  Thanks.

## 2019-05-24 NOTE — Telephone Encounter (Signed)
SEE MESSAGE

## 2019-05-25 ENCOUNTER — Telehealth: Payer: Self-pay | Admitting: Radiology

## 2019-05-25 NOTE — Telephone Encounter (Signed)
Shaunda with [facility] called and requests that [facility] are able to continue going out to see the patient since Dr. Erlinda Hong is scheduling surgery for him. She states that they have been seeing patient since 03/13/2019 for medication management and wound care, as he has a wound on his neck that they are taking care of. Their last visit to patient was on 05/11/2019 due to them awaiting orders to continue from [place].  Shelby Dubin also states that they would be happy to resume PT if needed post operatively.  CB for Shaunda is [phone]

## 2019-05-25 NOTE — Telephone Encounter (Signed)
He needs to come in for [place] to evaluate wound before we can proceed with surgery.

## 2019-05-25 NOTE — Telephone Encounter (Signed)
Holding for you. Unsure if patient has been scheduled for surgery. Per note from Dr. Erlinda Hong, he wants patient to come in for appt to look at wound on neck before he proceeds with surgery.

## 2019-05-26 NOTE — Telephone Encounter (Signed)
Appt made he is aware.

## 2019-05-31 ENCOUNTER — Ambulatory Visit (INDEPENDENT_AMBULATORY_CARE_PROVIDER_SITE_OTHER): Payer: No Typology Code available for payment source | Admitting: Orthopaedic Surgery

## 2019-05-31 ENCOUNTER — Other Ambulatory Visit: Payer: Self-pay

## 2019-05-31 ENCOUNTER — Encounter: Payer: Self-pay | Admitting: Orthopaedic Surgery

## 2019-05-31 DIAGNOSIS — M19011 Primary osteoarthritis, right shoulder: Secondary | ICD-10-CM

## 2019-05-31 DIAGNOSIS — M7541 Impingement syndrome of right shoulder: Secondary | ICD-10-CM | POA: Diagnosis not present

## 2019-05-31 DIAGNOSIS — M75101 Unspecified rotator cuff tear or rupture of right shoulder, not specified as traumatic: Secondary | ICD-10-CM | POA: Diagnosis not present

## 2019-05-31 NOTE — Progress Notes (Addendum)
Office Visit Note   Patient: Alan Brown           Date of Birth: 02-Dec-1951           MRN: [id number] Visit Date: 05/31/2019              Requested by: Alan Brown, L.Alan Brown, [place] [employer] [place],  [place] [address] PCP: Alan Brown, L.Alan Sa, MD   Assessment & Plan: Visit Diagnoses:  1. Tear of right supraspinatus tendon   2. Impingement syndrome of right shoulder   3. Arthrosis of right acromioclavicular joint     Plan: Previous MRI of the right shoulder shows a full-thickness supraspinatus tear with AC joint arthrosis and impingement.  Patient has failed conservative treatment.  We have obtained PCP clearance for surgery.  We will plan to move forward with arthroscopic right shoulder surgery and rotator cuff repair along with debridements as indicated in the near future.  VA authorization has already been obtained for the surgery.  He will also need referral to the wound center for management of his chronic posterior neck wound.    Follow-Up Instructions: Return if symptoms worsen or fail to improve.   Orders:  No orders of the defined types were placed in this encounter.  No orders of the defined types were placed in this encounter.     Procedures: No procedures performed   Clinical Data: No additional findings.   Subjective: Chief Complaint  Patient presents with  . Right Shoulder - Pain    Alan Brown is a 68 year old gentleman comes in for discussion is right shoulder pain.  He has questions about his upcoming surgery.  He has been treated.  He is a patient from the [place].   Review of Systems  Constitutional: Negative.   All other systems reviewed and are negative.    Objective: Vital Signs: There were no vitals taken for this visit.  Physical Exam Vitals signs and nursing note reviewed.  Constitutional:      Appearance: He is well-developed.  Pulmonary:     Effort: Pulmonary effort is normal.  Abdominal:     Palpations: Abdomen is  soft.  Skin:    General: Skin is warm.  Neurological:     Mental Status: He is alert and oriented to person, place, and time.  Psychiatric:        Behavior: Behavior normal.        Thought Content: Thought content normal.        Judgment: Judgment normal.     Ortho Exam Right shoulder exam is unchanged. Specialty Comments:  No specialty comments available.  Imaging: No results found.   PMFS History: Patient Active Problem List   Diagnosis Date Noted  . Acute respiratory failure with hypoxia ([place]) 04/23/2019  . Tear of right supraspinatus tendon 03/23/2019  . Arthrosis of right acromioclavicular joint 03/23/2019  . Depression 01/24/2014  . Polysubstance (including opioids) dependence, daily use ([place]) 01/24/2014  . Chronic pain syndrome 01/24/2014  . Sepsis ([place]) 01/23/2014  . Altered mental status 01/23/2014  . Acute encephalopathy 11/19/2013  . SIRS (systemic inflammatory response syndrome) ([place]) 11/19/2013  . Community acquired pneumonia 11/19/2013  . GERD (gastroesophageal reflux disease) 11/19/2013  . Bipolar 1 disorder ([place]) 11/19/2013  . Lactic acidosis 11/19/2013  . Pneumonia 11/19/2013   Past Medical History:  Diagnosis Date  . Anxiety   . Arthritis    "all through my body" (01/26/2014)  . Bipolar 1 disorder ([place])   . Chronic lower  back pain   . Depression   . Depression   . GERD (gastroesophageal reflux disease)   . Pneumonia 1952; 11/2013  . Prolapse, disk    lower back    Family History  Problem Relation Age of Onset  . Hypertension Mother   . Hypertension Father   . Aneurysm Father   . Other Sister        killed  . Diabetes Sister   . Heart disease Sister     Past Surgical History:  Procedure Laterality Date  . APPENDECTOMY  1961  . INGUINAL HERNIA REPAIR Left 1998  . TONSILLECTOMY  1976   Social History   Occupational History  . Occupation: disabled veteran  Tobacco Use  . Smoking status: Former Smoker    Packs/day: 1.00     Years: 25.00    Pack years: 25.00    Types: Cigarettes  . Smokeless tobacco: Never Used  . Tobacco comment: 01/26/2014 "quit smoking 15-16 yr ago"  Substance and Sexual Activity  . Alcohol use: No    Frequency: Never    Comment:  "quit drinking 25 yr ago; I'm a recovering alcoholic"  . Drug use: No  . Sexual activity: Not Currently

## 2019-06-07 NOTE — Telephone Encounter (Signed)
I called patient and left voice mail for return call. °

## 2019-06-21 ENCOUNTER — Telehealth: Payer: Self-pay | Admitting: Orthopaedic Surgery

## 2019-06-21 NOTE — Telephone Encounter (Signed)
Called and she said she was returning a call to [employer]. Advised did not have a Heather here by that name.

## 2019-06-21 NOTE — Telephone Encounter (Signed)
call

## 2019-06-21 NOTE — Telephone Encounter (Signed)
Alan Brown left a voicemail stating she was returning a call to our office.

## 2019-06-23 ENCOUNTER — Encounter (HOSPITAL_BASED_OUTPATIENT_CLINIC_OR_DEPARTMENT_OTHER): Payer: Self-pay | Admitting: *Deleted

## 2019-06-23 ENCOUNTER — Other Ambulatory Visit: Payer: Self-pay

## 2019-06-25 ENCOUNTER — Other Ambulatory Visit (HOSPITAL_COMMUNITY)
Admission: RE | Admit: 2019-06-25 | Discharge: 2019-06-25 | Disposition: A | Discharge status: Home / Self Care | Payer: No Typology Code available for payment source | Source: Ambulatory Visit | Attending: Orthopaedic Surgery | Admitting: Orthopaedic Surgery

## 2019-06-25 DIAGNOSIS — Z1159 Encounter for screening for other viral diseases: Secondary | ICD-10-CM | POA: Insufficient documentation

## 2019-06-25 DIAGNOSIS — Z01812 Encounter for preprocedural laboratory examination: Secondary | ICD-10-CM | POA: Insufficient documentation

## 2019-06-25 LAB — SARS CORONAVIRUS 2 (TAT 6-24 HRS): SARS Coronavirus 2: NEGATIVE

## 2019-06-27 ENCOUNTER — Encounter (HOSPITAL_BASED_OUTPATIENT_CLINIC_OR_DEPARTMENT_OTHER)
Admission: RE | Admit: 2019-06-27 | Discharge: 2019-06-27 | Disposition: A | Discharge status: Home / Self Care | Payer: No Typology Code available for payment source | Source: Ambulatory Visit | Attending: Orthopaedic Surgery | Admitting: Orthopaedic Surgery

## 2019-06-27 ENCOUNTER — Other Ambulatory Visit: Payer: Self-pay

## 2019-06-27 DIAGNOSIS — Z87891 Personal history of nicotine dependence: Secondary | ICD-10-CM | POA: Diagnosis not present

## 2019-06-27 DIAGNOSIS — M19011 Primary osteoarthritis, right shoulder: Secondary | ICD-10-CM | POA: Diagnosis not present

## 2019-06-27 DIAGNOSIS — M65811 Other synovitis and tenosynovitis, right shoulder: Secondary | ICD-10-CM | POA: Diagnosis not present

## 2019-06-27 DIAGNOSIS — M94211 Chondromalacia, right shoulder: Secondary | ICD-10-CM | POA: Diagnosis not present

## 2019-06-27 DIAGNOSIS — I10 Essential (primary) hypertension: Secondary | ICD-10-CM | POA: Diagnosis not present

## 2019-06-27 DIAGNOSIS — M7521 Bicipital tendinitis, right shoulder: Secondary | ICD-10-CM | POA: Diagnosis not present

## 2019-06-27 DIAGNOSIS — Z79899 Other long term (current) drug therapy: Secondary | ICD-10-CM | POA: Diagnosis not present

## 2019-06-27 DIAGNOSIS — M545 Low back pain: Secondary | ICD-10-CM | POA: Diagnosis not present

## 2019-06-27 DIAGNOSIS — M7541 Impingement syndrome of right shoulder: Secondary | ICD-10-CM | POA: Diagnosis not present

## 2019-06-27 DIAGNOSIS — F419 Anxiety disorder, unspecified: Secondary | ICD-10-CM | POA: Diagnosis not present

## 2019-06-27 DIAGNOSIS — Z7982 Long term (current) use of aspirin: Secondary | ICD-10-CM | POA: Diagnosis not present

## 2019-06-27 DIAGNOSIS — M75121 Complete rotator cuff tear or rupture of right shoulder, not specified as traumatic: Secondary | ICD-10-CM | POA: Diagnosis not present

## 2019-06-27 DIAGNOSIS — F319 Bipolar disorder, unspecified: Secondary | ICD-10-CM | POA: Diagnosis not present

## 2019-06-27 DIAGNOSIS — J449 Chronic obstructive pulmonary disease, unspecified: Secondary | ICD-10-CM | POA: Diagnosis not present

## 2019-06-27 DIAGNOSIS — K219 Gastro-esophageal reflux disease without esophagitis: Secondary | ICD-10-CM | POA: Diagnosis not present

## 2019-06-27 LAB — BASIC METABOLIC PANEL
Anion gap: 8 (ref 5–15)
BUN: 11 mg/dL (ref 8–23)
CO2: 31 mmol/L (ref 22–32)
Calcium: 9.2 mg/dL (ref 8.9–10.3)
Chloride: 101 mmol/L (ref 98–111)
Creatinine, Ser: 1.1 mg/dL (ref 0.61–1.24)
GFR calc Af Amer: >60 mL/min (ref >60)
GFR calc non Af Amer: >60 mL/min (ref >60)
Glucose, Bld: 119 mg/dL — ABNORMAL HIGH (ref 70–99)
Potassium: 4.3 mmol/L (ref 3.5–5.1)
Sodium: 140 mmol/L (ref 135–145)

## 2019-06-27 NOTE — Progress Notes (Signed)
Ensure pre surgery drink given with instructions, pt verbalized understanding. 

## 2019-06-28 ENCOUNTER — Telehealth: Payer: Self-pay | Admitting: Radiology

## 2019-06-28 NOTE — Telephone Encounter (Signed)
Patient called triage, states he has not received call back. He is concerned about his pain medication for after surgery. Scheduled to tomorrow 06/29/19 right shoulder surgery.  Currently taking oxycodone 10mg , patient, states that he will possibly need something stronger since this doesn't fully help his current pain. I advised patient his pain medication may not be addressed until after surgery.  Call back # [phone]

## 2019-06-28 NOTE — Telephone Encounter (Signed)
We will give him something to supplement for one to two weeks post-op

## 2019-06-28 NOTE — Telephone Encounter (Signed)
Left message on voicemail.

## 2019-06-29 ENCOUNTER — Encounter (HOSPITAL_BASED_OUTPATIENT_CLINIC_OR_DEPARTMENT_OTHER): Payer: Self-pay | Admitting: *Deleted

## 2019-06-29 ENCOUNTER — Encounter: Payer: Self-pay | Admitting: Orthopaedic Surgery

## 2019-06-29 ENCOUNTER — Ambulatory Visit (HOSPITAL_BASED_OUTPATIENT_CLINIC_OR_DEPARTMENT_OTHER): Payer: No Typology Code available for payment source | Admitting: Certified Registered"

## 2019-06-29 ENCOUNTER — Other Ambulatory Visit: Payer: Self-pay

## 2019-06-29 ENCOUNTER — Encounter (HOSPITAL_BASED_OUTPATIENT_CLINIC_OR_DEPARTMENT_OTHER)
Admission: RE | Disposition: A | Discharge status: Home / Self Care | Payer: Self-pay | Source: Home / Self Care | Attending: Orthopaedic Surgery

## 2019-06-29 ENCOUNTER — Ambulatory Visit (HOSPITAL_BASED_OUTPATIENT_CLINIC_OR_DEPARTMENT_OTHER)
Admission: RE | Admit: 2019-06-29 | Discharge: 2019-06-29 | Disposition: A | Discharge status: Home / Self Care | Payer: No Typology Code available for payment source | Attending: Orthopaedic Surgery | Admitting: Orthopaedic Surgery

## 2019-06-29 DIAGNOSIS — Z7982 Long term (current) use of aspirin: Secondary | ICD-10-CM | POA: Insufficient documentation

## 2019-06-29 DIAGNOSIS — M7541 Impingement syndrome of right shoulder: Secondary | ICD-10-CM

## 2019-06-29 DIAGNOSIS — Z87891 Personal history of nicotine dependence: Secondary | ICD-10-CM | POA: Insufficient documentation

## 2019-06-29 DIAGNOSIS — M7521 Bicipital tendinitis, right shoulder: Secondary | ICD-10-CM | POA: Insufficient documentation

## 2019-06-29 DIAGNOSIS — Z79899 Other long term (current) drug therapy: Secondary | ICD-10-CM | POA: Insufficient documentation

## 2019-06-29 DIAGNOSIS — F319 Bipolar disorder, unspecified: Secondary | ICD-10-CM | POA: Insufficient documentation

## 2019-06-29 DIAGNOSIS — M75121 Complete rotator cuff tear or rupture of right shoulder, not specified as traumatic: Secondary | ICD-10-CM | POA: Insufficient documentation

## 2019-06-29 DIAGNOSIS — M19011 Primary osteoarthritis, right shoulder: Secondary | ICD-10-CM | POA: Diagnosis not present

## 2019-06-29 DIAGNOSIS — M65811 Other synovitis and tenosynovitis, right shoulder: Secondary | ICD-10-CM | POA: Insufficient documentation

## 2019-06-29 DIAGNOSIS — J449 Chronic obstructive pulmonary disease, unspecified: Secondary | ICD-10-CM | POA: Insufficient documentation

## 2019-06-29 DIAGNOSIS — I10 Essential (primary) hypertension: Secondary | ICD-10-CM | POA: Insufficient documentation

## 2019-06-29 DIAGNOSIS — M75101 Unspecified rotator cuff tear or rupture of right shoulder, not specified as traumatic: Secondary | ICD-10-CM

## 2019-06-29 DIAGNOSIS — M94211 Chondromalacia, right shoulder: Secondary | ICD-10-CM | POA: Insufficient documentation

## 2019-06-29 DIAGNOSIS — F419 Anxiety disorder, unspecified: Secondary | ICD-10-CM | POA: Insufficient documentation

## 2019-06-29 DIAGNOSIS — K219 Gastro-esophageal reflux disease without esophagitis: Secondary | ICD-10-CM | POA: Insufficient documentation

## 2019-06-29 DIAGNOSIS — M545 Low back pain: Secondary | ICD-10-CM | POA: Insufficient documentation

## 2019-06-29 HISTORY — PX: SHOULDER ARTHROSCOPY WITH ROTATOR CUFF REPAIR AND SUBACROMIAL DECOMPRESSION: SHX5686

## 2019-06-29 SURGERY — SHOULDER ARTHROSCOPY WITH ROTATOR CUFF REPAIR AND SUBACROMIAL DECOMPRESSION
Anesthesia: General | Site: Shoulder | Laterality: Right

## 2019-06-29 MED ORDER — DEXMEDETOMIDINE HCL IN NACL 200 MCG/50ML IV SOLN
INTRAVENOUS | Status: DC | PRN
  Administered 2019-06-29 (×2): 12 ug via INTRAVENOUS

## 2019-06-29 MED ORDER — FENTANYL CITRATE (PF) 100 MCG/2ML IJ SOLN
INTRAMUSCULAR | Status: AC
  Filled 2019-06-29: qty 2

## 2019-06-29 MED ORDER — CHLORHEXIDINE GLUCONATE 4 % EX LIQD: 60.0000 mL | Freq: Once | CUTANEOUS | Status: DC

## 2019-06-29 MED ORDER — SCOPOLAMINE 1 MG/3DAYS TD PT72: 1.0000 | MEDICATED_PATCH | Freq: Once | TRANSDERMAL | Status: DC

## 2019-06-29 MED ORDER — CEFAZOLIN SODIUM-DEXTROSE 2-4 GM/100ML-% IV SOLN: 2.0000 g | INTRAVENOUS | Status: DC

## 2019-06-29 MED ORDER — OXYCODONE HCL 5 MG/5ML PO SOLN: 5.0000 mg | Freq: Once | ORAL | Status: DC | PRN

## 2019-06-29 MED ORDER — SUCCINYLCHOLINE CHLORIDE 20 MG/ML IJ SOLN
INTRAMUSCULAR | Status: DC | PRN
  Administered 2019-06-29: 140 mg via INTRAVENOUS

## 2019-06-29 MED ORDER — MIDAZOLAM HCL 2 MG/2ML IJ SOLN
INTRAMUSCULAR | Status: AC
  Filled 2019-06-29: qty 2

## 2019-06-29 MED ORDER — PROPOFOL 10 MG/ML IV BOLUS
INTRAVENOUS | Status: DC | PRN
  Administered 2019-06-29: 200 mg via INTRAVENOUS

## 2019-06-29 MED ORDER — OXYCODONE HCL 5 MG PO TABS: 5.0000 mg | ORAL_TABLET | Freq: Once | ORAL | Status: DC | PRN

## 2019-06-29 MED ORDER — CEFAZOLIN SODIUM-DEXTROSE 2-4 GM/100ML-% IV SOLN
INTRAVENOUS | Status: AC
  Filled 2019-06-29: qty 100

## 2019-06-29 MED ORDER — FENTANYL CITRATE (PF) 100 MCG/2ML IJ SOLN: 25.0000 ug | INTRAMUSCULAR | Status: DC | PRN

## 2019-06-29 MED ORDER — FENTANYL CITRATE (PF) 100 MCG/2ML IJ SOLN
50.0000 ug | INTRAMUSCULAR | Status: DC | PRN
  Administered 2019-06-29: 100 ug via INTRAVENOUS

## 2019-06-29 MED ORDER — SUGAMMADEX SODIUM 500 MG/5ML IV SOLN
INTRAVENOUS | Status: DC | PRN
  Administered 2019-06-29: 500 mg via INTRAVENOUS

## 2019-06-29 MED ORDER — PROPOFOL 10 MG/ML IV BOLUS
INTRAVENOUS | Status: AC
  Filled 2019-06-29: qty 40

## 2019-06-29 MED ORDER — ROCURONIUM BROMIDE 10 MG/ML (PF) SYRINGE
PREFILLED_SYRINGE | INTRAVENOUS | Status: AC
  Filled 2019-06-29: qty 10

## 2019-06-29 MED ORDER — DEXAMETHASONE SODIUM PHOSPHATE 10 MG/ML IJ SOLN
INTRAMUSCULAR | Status: AC
  Filled 2019-06-29: qty 1

## 2019-06-29 MED ORDER — LACTATED RINGERS IV SOLN
INTRAVENOUS | Status: DC
  Administered 2019-06-29: 12:00:00 via INTRAVENOUS

## 2019-06-29 MED ORDER — BUPIVACAINE HCL (PF) 0.5 % IJ SOLN
INTRAMUSCULAR | Status: DC | PRN
  Administered 2019-06-29: 15 mL via PERINEURAL

## 2019-06-29 MED ORDER — EPHEDRINE SULFATE 50 MG/ML IJ SOLN
INTRAMUSCULAR | Status: DC | PRN
  Administered 2019-06-29: 10 mg via INTRAVENOUS

## 2019-06-29 MED ORDER — ROCURONIUM BROMIDE 100 MG/10ML IV SOLN
INTRAVENOUS | Status: DC | PRN
  Administered 2019-06-29: 50 mg via INTRAVENOUS

## 2019-06-29 MED ORDER — HYDROMORPHONE HCL 2 MG PO TABS: 2.0000 mg | ORAL_TABLET | Freq: Three times a day (TID) | ORAL | 0 refills | Status: DC | PRN

## 2019-06-29 MED ORDER — ONDANSETRON HCL 4 MG/2ML IJ SOLN
INTRAMUSCULAR | Status: DC | PRN
  Administered 2019-06-29: 4 mg via INTRAVENOUS

## 2019-06-29 MED ORDER — MIDAZOLAM HCL 2 MG/2ML IJ SOLN
1.0000 mg | INTRAMUSCULAR | Status: DC | PRN
  Administered 2019-06-29: 2 mg via INTRAVENOUS

## 2019-06-29 MED ORDER — DEXAMETHASONE SODIUM PHOSPHATE 4 MG/ML IJ SOLN
INTRAMUSCULAR | Status: DC | PRN
  Administered 2019-06-29: 10 mg via INTRAVENOUS

## 2019-06-29 MED ORDER — SUGAMMADEX SODIUM 500 MG/5ML IV SOLN
INTRAVENOUS | Status: AC
  Filled 2019-06-29: qty 5

## 2019-06-29 MED ORDER — BUPIVACAINE LIPOSOME 1.3 % IJ SUSP
INTRAMUSCULAR | Status: DC | PRN
  Administered 2019-06-29: 10 mL

## 2019-06-29 MED ORDER — LIDOCAINE HCL (CARDIAC) PF 100 MG/5ML IV SOSY
PREFILLED_SYRINGE | INTRAVENOUS | Status: DC | PRN
  Administered 2019-06-29: 100 mg via INTRATRACHEAL

## 2019-06-29 MED ORDER — LACTATED RINGERS IV SOLN
INTRAVENOUS | Status: DC
  Administered 2019-06-29 (×2): via INTRAVENOUS

## 2019-06-29 MED ORDER — ONDANSETRON HCL 4 MG/2ML IJ SOLN: 4.0000 mg | Freq: Once | INTRAMUSCULAR | Status: DC | PRN

## 2019-06-29 MED ORDER — PROMETHAZINE HCL 25 MG PO TABS: 25.0000 mg | ORAL_TABLET | Freq: Four times a day (QID) | ORAL | 1 refills | Status: DC | PRN

## 2019-06-29 MED ORDER — ONDANSETRON HCL 4 MG/2ML IJ SOLN
INTRAMUSCULAR | Status: AC
  Filled 2019-06-29: qty 2

## 2019-06-29 MED ORDER — BUPIVACAINE-EPINEPHRINE 0.25% -1:200000 IJ SOLN
INTRAMUSCULAR | Status: DC | PRN
  Administered 2019-06-29: 20 mL

## 2019-06-29 SURGICAL SUPPLY — 72 items
ANCH SUT 4.75 2 THRD PEEK (Anchor) ×1 IMPLANT
ANCHOR SUT CROSSFT KNTLS 4.75 (Anchor) ×2 IMPLANT
APL SKNCLS STERI-STRIP NONHPOA (GAUZE/BANDAGES/DRESSINGS)
BENZOIN TINCTURE PRP APPL 2/3 (GAUZE/BANDAGES/DRESSINGS) IMPLANT
BLADE CUTTER GATOR 3.5 (BLADE) ×3 IMPLANT
BLADE GREAT WHITE 4.2 (BLADE) ×1 IMPLANT
BLADE GREAT WHITE 4.2MM (BLADE) ×1
BLADE SURG 15 STRL LF DISP TIS (BLADE) IMPLANT
BLADE SURG 15 STRL SS (BLADE)
BUR OVAL 4.0 (BURR) ×3 IMPLANT
CANNULA 5.75X71 LONG (CANNULA) ×3 IMPLANT
CANNULA CANNULOC THREADED (CANNULA) ×2 IMPLANT
CANNULA SHOULDER 7CM (CANNULA) ×3 IMPLANT
CANNULA TWIST IN 8.25X7CM (CANNULA) IMPLANT
CANNULA TWIST IN 8.25X9CM (CANNULA) IMPLANT
CLOSURE STERI-STRIP 1/2X4 (GAUZE/BANDAGES/DRESSINGS)
CLSR STERI-STRIP ANTIMIC 1/2X4 (GAUZE/BANDAGES/DRESSINGS) IMPLANT
COVER WAND RF STERILE (DRAPES) IMPLANT
DECANTER SPIKE VIAL GLASS SM (MISCELLANEOUS) IMPLANT
DRAPE IMP U-DRAPE 54X76 (DRAPES) ×3 IMPLANT
DRAPE INCISE IOBAN 66X45 STRL (DRAPES) ×3 IMPLANT
DRAPE SPLIT 6X30 W/TAPE (DRAPES) ×6 IMPLANT
DRAPE STERI 35X30 U-POUCH (DRAPES) ×3 IMPLANT
DRAPE SURG 17X23 STRL (DRAPES) ×3 IMPLANT
DRAPE U-SHAPE 47X51 STRL (DRAPES) ×3 IMPLANT
DRSG PAD ABDOMINAL 8X10 ST (GAUZE/BANDAGES/DRESSINGS) ×6 IMPLANT
DURAPREP 26ML APPLICATOR (WOUND CARE) ×6 IMPLANT
ELECT REM PT RETURN 9FT ADLT (ELECTROSURGICAL)
ELECTRODE REM PT RTRN 9FT ADLT (ELECTROSURGICAL) IMPLANT
GAUZE SPONGE 4X4 12PLY STRL (GAUZE/BANDAGES/DRESSINGS) ×6 IMPLANT
GAUZE XEROFORM 1X8 LF (GAUZE/BANDAGES/DRESSINGS) ×3 IMPLANT
GLOVE BIO SURGEON STRL SZ 6.5 (GLOVE) ×1 IMPLANT
GLOVE BIO SURGEONS STRL SZ 6.5 (GLOVE) ×1
GLOVE BIOGEL PI IND STRL 7.0 (GLOVE) ×1 IMPLANT
GLOVE BIOGEL PI INDICATOR 7.0 (GLOVE) ×4
GLOVE ECLIPSE 7.0 STRL STRAW (GLOVE) ×3 IMPLANT
GLOVE SKINSENSE NS SZ7.5 (GLOVE) ×2
GLOVE SKINSENSE STRL SZ7.5 (GLOVE) ×1 IMPLANT
GLOVE SURG SYN 7.5  E (GLOVE) ×2
GLOVE SURG SYN 7.5 E (GLOVE) ×1 IMPLANT
GLOVE SURG SYN 7.5 PF PI (GLOVE) ×1 IMPLANT
GOWN STRL REIN XL XLG (GOWN DISPOSABLE) ×3 IMPLANT
GOWN STRL REUS W/ TWL LRG LVL3 (GOWN DISPOSABLE) ×1 IMPLANT
GOWN STRL REUS W/ TWL XL LVL3 (GOWN DISPOSABLE) ×1 IMPLANT
GOWN STRL REUS W/TWL LRG LVL3 (GOWN DISPOSABLE) ×3
GOWN STRL REUS W/TWL XL LVL3 (GOWN DISPOSABLE) ×3
IMMOBILIZER SHOULDER FOAM XLGE (SOFTGOODS) IMPLANT
KIT SHOULDER TRACTION (DRAPES) ×3 IMPLANT
MANIFOLD NEPTUNE II (INSTRUMENTS) ×3 IMPLANT
NDL SCORPION MULTI FIRE (NEEDLE) IMPLANT
NEEDLE SCORPION MULTI FIRE (NEEDLE) ×3 IMPLANT
PACK ARTHROSCOPY DSU (CUSTOM PROCEDURE TRAY) ×3 IMPLANT
PACK BASIN DAY SURGERY FS (CUSTOM PROCEDURE TRAY) ×3 IMPLANT
PROBE BIPOLAR ATHRO 135MM 90D (MISCELLANEOUS) ×3 IMPLANT
SLEEVE SCD COMPRESS KNEE MED (MISCELLANEOUS) ×3 IMPLANT
SLING ARM FOAM STRAP LRG (SOFTGOODS) IMPLANT
SLING ARM IMMOBILIZER LRG (SOFTGOODS) IMPLANT
SLING ARM IMMOBILIZER MED (SOFTGOODS) IMPLANT
SLING ARM MED ADULT FOAM STRAP (SOFTGOODS) IMPLANT
SLING ARM XL FOAM STRAP (SOFTGOODS) IMPLANT
SUT ETHILON 3 0 PS 1 (SUTURE) ×3 IMPLANT
SUT FIBERWIRE #2 38 T-5 BLUE (SUTURE)
SUT HI-FI 2 STRAND C-2 40 (SUTURE) ×2 IMPLANT
SUT TIGER TAPE 7 IN WHITE (SUTURE) IMPLANT
SUTURE FIBERWR #2 38 T-5 BLUE (SUTURE) IMPLANT
SYR 50ML LL SCALE MARK (SYRINGE) IMPLANT
TAPE FIBER 2MM 7IN #2 BLUE (SUTURE) IMPLANT
TOWEL GREEN STERILE FF (TOWEL DISPOSABLE) ×3 IMPLANT
TUBE CONNECTING 20'X1/4 (TUBING)
TUBE CONNECTING 20X1/4 (TUBING) IMPLANT
TUBING ARTHRO INFLOW-ONLY STRL (TUBING) ×3 IMPLANT
WATER STERILE IRR 1000ML POUR (IV SOLUTION) ×3 IMPLANT

## 2019-06-29 NOTE — Anesthesia Postprocedure Evaluation (Signed)
Anesthesia Post Note  Patient: Alan Brown  Procedure(s) Performed: RIGHT SHOULDER ARTHROSCOPY WITH ROTATOR CUFF REPAIR, BICEPS TENO SUBACROMIAL DECOMPRESSION, DISTAL CLAVICLE EXCISION, EXTENSIVE DEBRIDEMENT (Right Shoulder)     Patient location during evaluation: PACU Anesthesia Type: General Level of consciousness: awake and alert Pain management: pain level controlled Vital Signs Assessment: post-procedure vital signs reviewed and stable Respiratory status: spontaneous breathing, nonlabored ventilation and respiratory function stable Cardiovascular status: blood pressure returned to baseline and stable Postop Assessment: no apparent nausea or vomiting Anesthetic complications: no    Last Vitals:  Vitals:   06/29/19 1215 06/29/19 1230  BP: 135/75   Pulse: (!) 51   Resp: 16   Temp:  (!) 35.7 C  SpO2: 99%     Last Pain:  Vitals:   06/29/19 1143  TempSrc:   PainSc: 0-No pain                 Lidia Collum

## 2019-06-29 NOTE — H&P (Signed)
PREOPERATIVE H&P  Chief Complaint: right shoulder rotator cuff tear, impingement  HPI: Alan Brown is a 68 y.o. male who presents for surgical treatment of right shoulder rotator cuff tear, impingement.  He denies any changes in medical history.  Past Medical History:  Diagnosis Date  . Anxiety   . Arthritis    "all through my body" (01/26/2014)  . Bipolar 1 disorder (HCC)   . Chronic lower back pain   . Depression   . Depression   . GERD (gastroesophageal reflux disease)   . Pneumonia 1952; 11/2013  . Prolapse, disk    lower back   Past Surgical History:  Procedure Laterality Date  . APPENDECTOMY  1961  . INGUINAL HERNIA REPAIR Left 1998  . TONSILLECTOMY  1976   Social History   Socioeconomic History  . Marital status: Married    Spouse name: Not on file  . Number of children: Not on file  . Years of education: Not on file  . Highest education level: Not on file  Occupational History  . Occupation: disabled veteran  Social Needs  . Financial resource strain: Not on file  . Food insecurity    Worry: Not on file    Inability: Not on file  . Transportation needs    Medical: Not on file    Non-medical: Not on file  Tobacco Use  . Smoking status: Former Smoker    Packs/day: 1.00    Years: 25.00    Pack years: 25.00    Types: Cigarettes  . Smokeless tobacco: Never Used  . Tobacco comment: 01/26/2014 "quit smoking 15-16 yr ago"  Substance and Sexual Activity  . Alcohol use: No    Frequency: Never    Comment:  "quit drinking 25 yr ago; I'm a recovering alcoholic"  . Drug use: No  . Sexual activity: Not Currently  Lifestyle  . Physical activity    Days per week: Not on file    Minutes per session: Not on file  . Stress: Not on file  Relationships  . Social Musicianconnections    Talks on phone: Not on file    Gets together: Not on file    Attends religious service: Not on file    Active member of club or organization: Not on file    Attends  meetings of clubs or organizations: Not on file    Relationship status: Not on file  Other Topics Concern  . Not on file  Social History Narrative  . Not on file   Family History  Problem Relation Age of Onset  . Hypertension Mother   . Hypertension Father   . Aneurysm Father   . Other Sister        killed  . Diabetes Sister   . Heart disease Sister    Allergies  Allergen Reactions  . Naproxen Other (See Comments)    Increases lithium toxicity.   . Gabapentin Other (See Comments)    Unknown reaction  . Morphine And Related Other (See Comments)    Inability to function/ disorientation   Prior to Admission medications   Medication Sig Start Date End Date Taking? Authorizing Provider  albuterol (VENTOLIN HFA) 108 (90 Base) MCG/ACT inhaler Inhale 2 puffs into the lungs 4 (four) times daily as needed for wheezing or shortness of breath.    Yes [provider]  aspirin EC 81 MG tablet Take 81 mg by mouth daily.   Yes [provider]  divalproex (DEPAKOTE) 500 MG  DR tablet Take 1 tablet (500 mg total) by mouth every 12 (twelve) hours for 30 days. 04/27/19 06/23/19 Yes Amin, Ankit Chirag, MD  DULoxetine (CYMBALTA) 60 MG capsule Take 60 mg by mouth daily. For mood and pain   Yes [provider]  atorvastatin (LIPITOR) 20 MG tablet Take 20 mg by mouth at bedtime.    [provider]  Calcium Carbonate-Vitamin D (CALCIUM 600+D) 600-400 MG-UNIT tablet Take 1 tablet by mouth 2 (two) times daily.    [provider]  diclofenac sodium (VOLTAREN) 1 % GEL Apply 1 application topically 3 (three) times daily as needed (pain).     [provider]  ferrous sulfate 325 (65 FE) MG tablet Take 325 mg by mouth every Monday, Wednesday, and Friday.    [provider]  HYDROcodone-acetaminophen (NORCO/VICODIN) 5-325 MG tablet Take 1 tablet by mouth 3 (three) times daily as needed (pain).     [provider]  hydrocortisone 2.5 % cream Apply  1 application topically 2 (two) times daily.    [provider]  methocarbamol (ROBAXIN) 500 MG tablet Take 1,000 mg by mouth 2 (two) times daily.    [provider]  Multiple Vitamin (MULTIVITAMIN WITH MINERALS) TABS tablet Take 1 tablet by mouth daily.    [provider]  omeprazole (PRILOSEC) 40 MG capsule Take 40 mg by mouth 2 (two) times daily before a meal.    [provider]  oxyCODONE-acetaminophen (PERCOCET) 10-325 MG tablet Take 1 tablet by mouth 4 (four) times daily as needed for pain.    [provider]  pregabalin (LYRICA) 300 MG capsule Take 300 mg by mouth at bedtime.    [provider]  primidone (MYSOLINE) 50 MG tablet Take 50 mg by mouth 3 (three) times daily. For tremors    [provider]  propranolol ER (INDERAL LA) 60 MG 24 hr capsule Take 60 mg by mouth daily.    [provider]  risperiDONE (RISPERDAL) 0.5 MG tablet Take 1 tablet (0.5 mg total) by mouth at bedtime as needed for up to 30 days (for agitation). 04/27/19 05/27/19  Amin, Jeanella Flattery, MD  tamsulosin (FLOMAX) 0.4 MG CAPS capsule Take 0.4 mg by mouth at bedtime.     [provider]  Tapentadol HCl (NUCYNTA) 100 MG TABS Take 100 mg by mouth 4 (four) times daily as needed (PAIN).    [provider]  Tiotropium Bromide Monohydrate (SPIRIVA RESPIMAT) 2.5 MCG/ACT AERS Inhale 2 puffs into the lungs daily.    [provider]  traMADol (ULTRAM) 50 MG tablet Take 1 tablet by mouth 4 (four) times daily as needed (pain).  09/22/17   [provider]  traZODone (DESYREL) 100 MG tablet Take 100 mg by mouth at bedtime as needed for sleep.     [provider]  triamcinolone cream (KENALOG) 0.5 % Apply 1 application topically 2 (two) times daily as needed (RASH).    [provider]     Positive ROS: All other systems have been reviewed and were otherwise negative with the exception of those mentioned in the HPI  and as above.  Physical Exam: General: Alert, no acute distress Cardiovascular: No pedal edema Respiratory: No cyanosis, no use of accessory musculature GI: abdomen soft Skin: No lesions in the area of chief complaint Neurologic: Sensation intact distally Psychiatric: Patient is competent for consent with normal mood and affect Lymphatic: no lymphedema  MUSCULOSKELETAL: exam stable  Assessment: right shoulder rotator cuff tear, impingement  Plan: Plan for Procedure(s): RIGHT SHOULDER ARTHROSCOPY WITH ROTATOR CUFF REPAIR, BICEPS TENODESIS, SUBACROMIAL DECOMPRESSION, DISTAL CLAVICLE EXCISION, EXTENSIVE DEBRIDEMENT  The risks benefits and alternatives were discussed with the patient including but not limited to the risks of nonoperative treatment, versus surgical intervention including infection, bleeding, nerve injury,  blood clots, cardiopulmonary complications, morbidity, mortality, among others, and they were willing to proceed.   Glee ArvinMichael Xu, MD   06/29/2019 7:21 AM

## 2019-06-29 NOTE — Anesthesia Preprocedure Evaluation (Addendum)
Anesthesia Evaluation  Patient identified by MRN, date of birth, ID band Patient awake    Reviewed: Allergy & Precautions, NPO status , Patient's Chart, lab work & pertinent test results, reviewed documented beta blocker date and time   History of Anesthesia Complications Negative for: history of anesthetic complications  Airway Mallampati: II  TM Distance: >3 FB Neck ROM: Full    Dental  (+) Teeth Intact   Pulmonary COPD, former smoker,    Pulmonary exam normal        Cardiovascular hypertension, Pt. on medications and Pt. on home beta blockers Normal cardiovascular exam     Neuro/Psych PSYCHIATRIC DISORDERS Anxiety Depression Bipolar Disorder negative neurological ROS     GI/Hepatic GERD  ,(+)     substance abuse  ,   Endo/Other  negative endocrine ROS  Renal/GU negative Renal ROS  negative genitourinary   Musculoskeletal  (+) Arthritis , narcotic dependent  Abdominal   Peds  Hematology negative hematology ROS (+)   Anesthesia Other Findings   Reproductive/Obstetrics                            Anesthesia Physical Anesthesia Plan  ASA: III  Anesthesia Plan: General   Post-op Pain Management: GA combined w/ Regional for post-op pain   Induction: Intravenous  PONV Risk Score and Plan: 2 and Ondansetron, Dexamethasone, Midazolam and Treatment may vary due to age or medical condition  Airway Management Planned: Oral ETT  Additional Equipment: None  Intra-op Plan:   Post-operative Plan: Extubation in OR  Informed Consent: I have reviewed the patients History and Physical, chart, labs and discussed the procedure including the risks, benefits and alternatives for the proposed anesthesia with the patient or authorized representative who has indicated his/her understanding and acceptance.     Dental advisory given  Plan Discussed with:   Anesthesia Plan Comments:         Anesthesia Quick Evaluation

## 2019-06-29 NOTE — Anesthesia Procedure Notes (Signed)
Anesthesia Regional Block: Interscalene brachial plexus block   Pre-Anesthetic Checklist: ,, timeout performed, Correct Patient, Correct Site, Correct Laterality, Correct Procedure, Correct Position, site marked, Risks and benefits discussed,  Surgical consent,  Pre-op evaluation,  At surgeon's request and post-op pain management  Laterality: Right  Prep: chloraprep       Needles:  Injection technique: Single-shot  Needle Type: Echogenic Stimulator Needle     Needle Length: 9cm  Needle Gauge: 21     Additional Needles:   Procedures:,,,, ultrasound used (permanent image in chart),,,,  Narrative:  Start time: 06/29/2019 9:10 AM End time: 06/29/2019 9:15 AM Injection made incrementally with aspirations every 5 mL.  Performed by: Personally  Anesthesiologist: Lidia Collum, MD  Additional Notes: Monitors applied. Injection made in 5cc increments. No resistance to injection. Good needle visualization. Patient tolerated procedure well.

## 2019-06-29 NOTE — Op Note (Signed)
Date of Surgery: 06/29/2019  INDICATIONS: The patient is a 68 year old male with right shoulder pain that has failed conservative treatment;  The patient did consent to the procedure after discussion of the risks and benefits.  PREOPERATIVE DIAGNOSIS:  1.  Right shoulder full-thickness and retracted tear of supraspinatus 2.  Right shoulder impingement syndrome 3.  Right shoulder biceps tendinopathy 4.  Right shoulder AC joint arthrosis 5.  Right shoulder degenerative labrum 6.  Right shoulder glenohumeral chondromalacia 7.  Right shoulder synovitis  POSTOPERATIVE DIAGNOSIS: Same.  PROCEDURE:  1.  Arthroscopic right shoulder rotator cuff repair of supraspinatus tendon 2.  Arthroscopic right shoulder distal clavicle excision 3.  Arthroscopic right shoulder extensive debridement of labrum, chondral surface, synovitis, rotator cuff, biceps tendon including tenotomy 4.  Arthroscopic right shoulder subacromial decompression and acromioplasty  SURGEON: N. Eduard Roux, M.D.  ASSIST: Ciro Backer [place], [place]; necessary for the timely completion of procedure and due to complexity of procedure..  ANESTHESIA:  general, regional  IV FLUIDS AND URINE: See anesthesia.  ESTIMATED BLOOD LOSS: minimal mL.  IMPLANTS: ConMed 4.75 mm bio composite anchor  COMPLICATIONS: None.  DESCRIPTION OF PROCEDURE: The patient was brought to the operating room and placed supine on the operating table.  The patient had been signed prior to the procedure and this was documented. The patient had the anesthesia placed by the anesthesiologist.  A time-out was performed to confirm that this was the correct patient, site, side and location. The patient did receive antibiotics prior to the incision and was re-dosed during the procedure as needed at indicated intervals.  The patient was then moved into the lateral position with the operative extremity suspended in the fishing pole mechanism. The patient had the  operative extremity prepped and draped in the standard surgical fashion.    Incisions were made for shoulder arthroscopy portals.  Diagnostic shoulder arthroscopy was first completed which revealed glenohumeral chondromalacia, degenerative labral tearing, biceps tendinopathy, rotator interval synovitis, full-thickness retracted tear of the supraspinatus.  An extensive debridement of all of the above mentioned conditions was performed using oscillating shaver.  Biceps tenotomy was performed using a Bovie wand.  The arthroscope was then repositioned in the subacromial space.  Subacromial decompression was performed including acromioplasty and CA ligament release as well as excision of the bursa.  The CA ligament was calcified and this had to be burred down with a high-speed bur.  Acromioplasty was also performed using a high-speed bur.  I then identified the [facility] joint and a distal clavicle excision was performed taking approximately 5 mm of the distal clavicle.  I then continue my debridement of the supraspinatus tendon.  The scar tissue was debrided in order to mobilize the tendon.  Unfortunately the tendon could not be mobilized all the way back to the greater tuberosity.  The greater tuberosity bone was then prepared using a high-speed bur.  A single row 4 strand repair of the supraspinatus was performed using a 4.75 mm bio composite anchor.  I was able to pull the edge of the supraspinatus back to the articular margin.  The posterior aspect of the tear could not be all the way pulled over and there was a small dog ear.  Unfortunately I was not able to achieve a watertight closure.  Excess fluid was then removed from the shoulder joint.  Incisions were closed with interrupted nylon sutures.  Sterile dressings were applied.  Shoulder immobilizer was placed.  Patient tolerated the procedure well had no  immediate complications.  POSTOPERATIVE PLAN: Discharge home.  Follow-up in 1 week for suture removal.  Mayra ReelN.  Michael Demarion Pondexter, MD [facility] [phone] 11:20 AM

## 2019-06-29 NOTE — Transfer of Care (Signed)
Immediate Anesthesia Transfer of Care Note  Patient: Alan Brown  Procedure(s) Performed: RIGHT SHOULDER ARTHROSCOPY WITH ROTATOR CUFF REPAIR, BICEPS TENODESIS, SUBACROMIAL DECOMPRESSION, DISTAL CLAVICLE EXCISION, EXTENSIVE DEBRIDEMENT (Right Shoulder)  Patient Location: PACU  Anesthesia Type:General and regional  Level of Consciousness: awake, sedated and patient cooperative  Airway & Oxygen Therapy: Patient Spontanous Breathing and Patient connected to face mask oxygen  Post-op Assessment: Report given to RN and Post -op Vital signs reviewed and stable  Post vital signs: Reviewed and stable  Last Vitals:  Vitals Value Taken Time  BP    Temp    Pulse 27 06/29/19 1144  Resp 17 06/29/19 1144  SpO2 100 % 06/29/19 1144  Vitals shown include unvalidated device data.  Last Pain:  Vitals:   06/29/19 0810  TempSrc: Oral  PainSc: 7          Complications: No apparent anesthesia complications

## 2019-06-29 NOTE — Discharge Instructions (Signed)
Post-operative patient instructions  Shoulder Arthroscopy    Ice:  Place intermittent ice or cooler pack over your shoulder, 30 minutes on and 30 minutes off.  Continue this for the first 72 hours after surgery, then save ice for use after therapy sessions or on more active days.    Weight:  You may NOT bear weight on your arm as your symptoms allow.  Motion:  Do NOT perform gentle shoulder motion as tolerated  Dressing:  Perform 1st dressing change at 2 days postoperative. A moderate amount of blood tinged drainage is to be expected.  So if you bleed through the dressing on the first or second day or if you have fevers, it is fine to change the dressing/check the wounds early and redress wound.  If it bleeds through again, or if the incisions are leaking frank blood, please call the office. May change dressing every 1-2 days thereafter to help watch wounds. Can purchase Tegaderm (or 59M Nexcare) water resistant dressings at local pharmacy / [facility].  Shower:  Light shower is ok after 2 days.  Please take shower, NO bath. Recover with gauze and ace wrap to help keep wounds protected.    Pain medication:  A narcotic pain medication has been prescribed.  Take as directed.  Typically you need narcotic pain medication more regularly during the first 3 to 5 days after surgery.  Decrease your use of the medication as the pain improves.  Narcotics can sometimes cause constipation, even after a few doses.  If you have problems with constipation, you can take an over the counter stool softener or light laxative.  If you have persistent problems, please notify your physicians office.  Physical therapy: Additional activity guidelines to be provided by your physician or physical therapist at follow-up visits.   Driving: Do not recommend driving x 2 weeks post surgical, especially if surgery performed on right side. Should not drive while taking narcotic pain medications. It typically takes at least 2  weeks to restore sufficient neuromuscular function for normal reaction times for driving safety.   Call [phone] for questions or problems. Evenings you will be forwarded to the hospital operator.  Ask for the orthopaedic physician on call. Please call if you experience:    o Redness, foul smelling, or persistent drainage from the surgical site  o worsening shoulder pain and swelling not responsive to medication  o any calf pain and or swelling of the lower leg  o temperatures greater than 101.5 F o other questions or concerns   Thank you for allowing [place] to be a part of your care.        Regional Anesthesia Blocks  1. Numbness or the inability to move the "blocked" extremity may last from 3-48 hours after placement. The length of time depends on the medication injected and your individual response to the medication. If the numbness is not going away after 48 hours, call your surgeon.  2. The extremity that is blocked will need to be protected until the numbness is gone and the  Strength has returned. Because you cannot feel it, you will need to take extra care to avoid injury. Because it may be weak, you may have difficulty moving it or using it. You may not know what position it is in without looking at it while the block is in effect.  3. For blocks in the legs and feet, returning to weight bearing and walking needs to be done carefully. You will need  to wait until the numbness is entirely gone and the strength has returned. You should be able to move your leg and foot normally before you try and bear weight or walk. You will need someone to be with you when you first try to ensure you do not fall and possibly risk injury.  4. Bruising and tenderness at the needle site are common side effects and will resolve in a few days.  5. Persistent numbness or new problems with movement should be communicated to the surgeon or the [facility] [phone])/ [facility] [phone]).     Information for Discharge Teaching: EXPAREL (bupivacaine liposome injectable suspension)   Your surgeon or anesthesiologist gave you EXPAREL(bupivacaine) to help control your pain after surgery.   EXPAREL is a local anesthetic that provides pain relief by numbing the tissue around the surgical site.  EXPAREL is designed to release pain medication over time and can control pain for up to 72 hours.  Depending on how you respond to EXPAREL, you may require less pain medication during your recovery.  Possible side effects:  Temporary loss of sensation or ability to move in the area where bupivacaine was injected.  Nausea, vomiting, constipation  Rarely, numbness and tingling in your mouth or lips, lightheadedness, or anxiety may occur.  Call your doctor right away if you think you may be experiencing any of these sensations, or if you have other questions regarding possible side effects.  Follow all other discharge instructions given to you by your surgeon or nurse. Eat a healthy diet and drink plenty of water or other fluids.  If you return to the hospital for any reason within 96 hours following the administration of EXPAREL, it is important for health care providers to know that you have received this anesthetic. A teal colored band has been placed on your arm with the date, time and amount of EXPAREL you have received in order to alert and inform your health care providers. Please leave this armband in place for the full 96 hours following administration, and then you may remove the band.       Post Anesthesia Home Care Instructions  Activity: Get plenty of rest for the remainder of the day. A responsible individual must stay with you for 24 hours following the procedure.  For the next 24 hours, DO NOT: -Drive a car -Advertising copywriterperate machinery -Drink alcoholic beverages -Take any medication unless instructed by your physician -Make any legal decisions or sign  important papers.  Meals: Start with liquid foods such as gelatin or soup. Progress to regular foods as tolerated. Avoid greasy, spicy, heavy foods. If nausea and/or vomiting occur, drink only clear liquids until the nausea and/or vomiting subsides. Call your physician if vomiting continues.  Special Instructions/Symptoms: Your throat may feel dry or sore from the anesthesia or the breathing tube placed in your throat during surgery. If this causes discomfort, gargle with warm salt water. The discomfort should disappear within 24 hours.  If you had a scopolamine patch placed behind your ear for the management of post- operative nausea and/or vomiting:  1. The medication in the patch is effective for 72 hours, after which it should be removed.  Wrap patch in a tissue and discard in the trash. Wash hands thoroughly with soap and water. 2. You may remove the patch earlier than 72 hours if you experience unpleasant side effects which may include dry mouth, dizziness or visual disturbances. 3. Avoid touching the patch. Wash  your hands with soap and water after contact with the patch.

## 2019-06-29 NOTE — Anesthesia Procedure Notes (Signed)
Procedure Name: Intubation Performed by: Verita Lamb, CRNA Pre-anesthesia Checklist: Patient identified, Emergency Drugs available, Suction available, Patient being monitored and Timeout performed Patient Re-evaluated:Patient Re-evaluated prior to induction Oxygen Delivery Method: Circle system utilized Preoxygenation: Pre-oxygenation with 100% oxygen Induction Type: IV induction Ventilation: Mask ventilation without difficulty Laryngoscope Size: Mac and 4 Grade View: Grade III Tube type: Oral Tube size: 7.0 mm Number of attempts: 1 Airway Equipment and Method: Stylet Placement Confirmation: ETT inserted through vocal cords under direct vision,  positive ETCO2,  CO2 detector and breath sounds checked- equal and bilateral Secured at: 24 cm Tube secured with: Tape Dental Injury: Teeth and Oropharynx as per pre-operative assessment  Comments: Easy mask vent, smooth iv induction, grade III view without cricoid pressure, with cricoid grade II view, atraumatic intubation.[email or web address]

## 2019-06-29 NOTE — Progress Notes (Signed)
Assisted Dr. Witman with right, ultrasound guided, interscalene  block. Side rails up, monitors on throughout procedure. See vital signs in flow sheet. Tolerated Procedure well. 

## 2019-06-30 ENCOUNTER — Encounter (HOSPITAL_BASED_OUTPATIENT_CLINIC_OR_DEPARTMENT_OTHER): Payer: Self-pay | Admitting: Orthopaedic Surgery

## 2019-06-30 NOTE — Addendum Note (Signed)
Addendum  created 06/30/19 0946 by Tawni Millers, CRNA   Charge Capture section accepted

## 2019-07-06 ENCOUNTER — Ambulatory Visit (INDEPENDENT_AMBULATORY_CARE_PROVIDER_SITE_OTHER): Payer: No Typology Code available for payment source | Admitting: Physician Assistant

## 2019-07-06 ENCOUNTER — Encounter: Payer: Self-pay | Admitting: Physician Assistant

## 2019-07-06 ENCOUNTER — Other Ambulatory Visit: Payer: Self-pay

## 2019-07-06 DIAGNOSIS — Z9889 Other specified postprocedural states: Secondary | ICD-10-CM

## 2019-07-06 NOTE — Progress Notes (Signed)
Post-Op Visit Note   Patient: Alan Brown           Date of Birth: 1950-12-19           MRN: [id number] Visit Date: 07/06/2019 PCP: Alroy Dust, L.Marlou Sa, MD   Assessment & Plan:  Chief Complaint:  Chief Complaint  Patient presents with  . Right Shoulder - Routine Post Op   Visit Diagnoses:  1. S/P right rotator cuff repair     Plan: Patient is a pleasant 68 year old gentleman who presents our clinic today 7 days status post right shoulder arthroscopic debridement, decompression rotator cuff repair, date of surgery 06/29/2019.  He has been doing well since surgery.  He is back to his baseline chronic pain medication.  No fevers or chills.  He has been compliant wearing his sling at all times.  Examination of the right shoulder reveals well-healed surgical portals with nylon sutures in place.  No evidence of infection.  He is neurovascular intact distally.  Today, nylon sutures were removed.  We will start the patient in formal physical therapy per rotator cuff repair protocol.  Prescription was provided to the patient today for this.  He will follow-up with [place] in 5 weeks time for repeat evaluation.  Call with concerns or questions in the meantime.  Follow-Up Instructions: Return in about 5 weeks (around 08/10/2019).   Orders:  No orders of the defined types were placed in this encounter.  No orders of the defined types were placed in this encounter.   Imaging: No new imaging  PMFS History: Patient Active Problem List   Diagnosis Date Noted  . Impingement syndrome of right shoulder   . Acute respiratory failure with hypoxia ([place]) 04/23/2019  . Tear of right supraspinatus tendon 03/23/2019  . Arthrosis of right acromioclavicular joint 03/23/2019  . Depression 01/24/2014  . Polysubstance (including opioids) dependence, daily use ([place]) 01/24/2014  . Chronic pain syndrome 01/24/2014  . Sepsis ([place]) 01/23/2014  . Altered mental status 01/23/2014  . Acute encephalopathy  11/19/2013  . SIRS (systemic inflammatory response syndrome) ([place]) 11/19/2013  . Community acquired pneumonia 11/19/2013  . GERD (gastroesophageal reflux disease) 11/19/2013  . Bipolar 1 disorder ([place]) 11/19/2013  . Lactic acidosis 11/19/2013  . Pneumonia 11/19/2013   Past Medical History:  Diagnosis Date  . Anxiety   . Arthritis    "all through my body" (01/26/2014)  . Bipolar 1 disorder ([place])   . Chronic lower back pain   . Depression   . Depression   . GERD (gastroesophageal reflux disease)   . Pneumonia 1952; 11/2013  . Prolapse, disk    lower back    Family History  Problem Relation Age of Onset  . Hypertension Mother   . Hypertension Father   . Aneurysm Father   . Other Sister        killed  . Diabetes Sister   . Heart disease Sister     Past Surgical History:  Procedure Laterality Date  . APPENDECTOMY  1961  . INGUINAL HERNIA REPAIR Left 1998  . SHOULDER ARTHROSCOPY WITH ROTATOR CUFF REPAIR AND SUBACROMIAL DECOMPRESSION Right 06/29/2019   Procedure: RIGHT SHOULDER ARTHROSCOPY WITH ROTATOR CUFF REPAIR, BICEPS TENO SUBACROMIAL DECOMPRESSION, DISTAL CLAVICLE EXCISION, EXTENSIVE DEBRIDEMENT;  Surgeon: Leandrew Koyanagi, MD;  Location: [facility];  Service: Orthopedics;  Laterality: Right;  . TONSILLECTOMY  1976   Social History   Occupational History  . Occupation: disabled veteran  Tobacco Use  . Smoking status: Former Smoker  Packs/day: 1.00    Years: 25.00    Pack years: 25.00    Types: Cigarettes  . Smokeless tobacco: Never Used  . Tobacco comment: 01/26/2014 "quit smoking 15-16 yr ago"  Substance and Sexual Activity  . Alcohol use: No    Frequency: Never    Comment:  "quit drinking 25 yr ago; I'm a recovering alcoholic"  . Drug use: No  . Sexual activity: Not Currently

## 2019-07-12 ENCOUNTER — Telehealth: Payer: Self-pay | Admitting: Orthopaedic Surgery

## 2019-07-12 NOTE — Telephone Encounter (Signed)
call

## 2019-07-12 NOTE — Telephone Encounter (Signed)
Tried calling patient to schedule nurse only appt to be fitted for sling He refused did not know when he could get a ride here.

## 2019-07-12 NOTE — Telephone Encounter (Signed)
Patient had surgery and wanted to call and request a new sling because it continues to come undone. Do,t need the pillow just the sling.  Please call patient @ [phone]

## 2019-07-13 ENCOUNTER — Telehealth: Payer: Self-pay | Admitting: Orthopaedic Surgery

## 2019-07-13 NOTE — Telephone Encounter (Signed)
Patient's wife called stating that the patient had surgery on July 15th but has not started PT.  She is needing you to request authorization for the PT from the [place].  CB#[phone].  She stated that it needs to be URGENT.

## 2019-07-14 NOTE — Telephone Encounter (Signed)
IC advised order faxed

## 2019-07-20 ENCOUNTER — Telehealth: Payer: Self-pay | Admitting: Orthopaedic Surgery

## 2019-07-20 NOTE — Telephone Encounter (Signed)
refaxed form request for additional services to the [place] along w/ ov notes [phone]

## 2019-07-22 ENCOUNTER — Telehealth: Payer: Self-pay

## 2019-07-22 NOTE — Telephone Encounter (Signed)
Patient called concerning his sling being broken and wanted to know if he could get another sling.  Patient is S/P rotator cuff repair.  Advised patient that he could receive another sling and that he may be charged.  Patient voiced that he understands and that he wife would be by to pick up the sling.

## 2019-08-01 ENCOUNTER — Telehealth: Payer: Self-pay | Admitting: Radiology

## 2019-08-01 NOTE — Telephone Encounter (Signed)
Patient called and states that he just received a call from the [place] in regards to PT and they are not going to start it until September 18. He would like to be sure that this will be ok.  Please advise. CB for patient is [phone]

## 2019-08-02 ENCOUNTER — Telehealth: Payer: Self-pay

## 2019-08-02 NOTE — Telephone Encounter (Signed)
April has left message for patient to return call in regards to another PT facility.  Per [facility] authorization, April states patient should be able to go anywhere.

## 2019-08-02 NOTE — Telephone Encounter (Signed)
Called and left a VM advising patient to CB concerning physical therapy facility.

## 2019-08-02 NOTE — Telephone Encounter (Signed)
The [facility] needs to find a way to start PT sooner either through the [place] or outside the [place] but he needs to start PT much sooner than that or else he's going to have a bad outcome.  Thanks.

## 2019-08-03 ENCOUNTER — Telehealth: Payer: Self-pay | Admitting: Radiology

## 2019-08-03 ENCOUNTER — Telehealth: Payer: Self-pay

## 2019-08-03 DIAGNOSIS — Z9889 Other specified postprocedural states: Secondary | ICD-10-CM

## 2019-08-03 NOTE — Telephone Encounter (Signed)
Talked with patient and advised him that a referral will be sent to  [facility] on [address].  Patient states that he understands.  Can you please put in referral for PT?  Thank you. Alan Brown patient.

## 2019-08-03 NOTE — Telephone Encounter (Signed)
Talked with patient concerning PT referral

## 2019-08-03 NOTE — Telephone Encounter (Signed)
April is aware patient has returned call and will call him back.

## 2019-08-03 NOTE — Addendum Note (Signed)
Addended by: Meyer Cory on: 08/03/2019 02:41 PM   Modules accepted: Orders

## 2019-08-03 NOTE — Telephone Encounter (Signed)
Patient was returning April's call about physical therapy Please call back at [phone]

## 2019-08-03 NOTE — Telephone Encounter (Signed)
Sent referral to OP PT at [facility].

## 2019-08-08 ENCOUNTER — Ambulatory Visit: Payer: No Typology Code available for payment source | Attending: Orthopaedic Surgery | Admitting: Physical Therapy

## 2019-08-08 ENCOUNTER — Other Ambulatory Visit: Payer: Self-pay

## 2019-08-08 ENCOUNTER — Encounter: Payer: Self-pay | Admitting: Physical Therapy

## 2019-08-08 DIAGNOSIS — R600 Localized edema: Secondary | ICD-10-CM | POA: Diagnosis present

## 2019-08-08 DIAGNOSIS — M25611 Stiffness of right shoulder, not elsewhere classified: Secondary | ICD-10-CM | POA: Diagnosis present

## 2019-08-08 DIAGNOSIS — G8929 Other chronic pain: Secondary | ICD-10-CM | POA: Diagnosis present

## 2019-08-08 DIAGNOSIS — M25511 Pain in right shoulder: Secondary | ICD-10-CM | POA: Diagnosis not present

## 2019-08-08 DIAGNOSIS — M6281 Muscle weakness (generalized): Secondary | ICD-10-CM | POA: Diagnosis present

## 2019-08-08 NOTE — Therapy (Signed)
[facility] [address] [place], [place], [address] Phone: [phone]   Fax:  [phone]  Physical Therapy Evaluation  Patient Details  Name: Alan MauJames Richard Berres MRN: [id number] Date of Birth: 1951-05-09 Referring Provider (PT): Tarry KosXu, Naiping M, MD   Encounter Date: 08/08/2019  PT End of Session - 08/08/19 1512    Visit Number  1    Number of Visits  16    Date for PT Re-Evaluation  10/03/19    Authorization Type  [facility]    Authorization Time Period  1 evaluation and 15 visits    Authorization - Number of Visits  15    PT Start Time  1500    PT Stop Time  1544    PT Time Calculation (min)  44 min    Activity Tolerance  Patient tolerated treatment well    Behavior During Therapy  [facility] for tasks assessed/performed       Past Medical History:  Diagnosis Date  . Anxiety   . Arthritis    "all through my body" (01/26/2014)  . Bipolar 1 disorder (H[facility])   . Chronic lower back pain   . Depression   . Depression   . GERD (gastroesophageal reflux disease)   . Pneumonia 1952; 11/2013  . Prolapse, disk    lower back  . Stroke [facility](HCC)    pt reported TIA    Past Surgical History:  Procedure Laterality Date  . APPENDECTOMY  1961  . INGUINAL HERNIA REPAIR Left 1998  . SHOULDER ARTHROSCOPY WITH ROTATOR CUFF REPAIR AND SUBACROMIAL DECOMPRESSION Right 06/29/2019   Procedure: RIGHT SHOULDER ARTHROSCOPY WITH ROTATOR CUFF REPAIR, BICEPS TENO SUBACROMIAL DECOMPRESSION, DISTAL CLAVICLE EXCISION, EXTENSIVE DEBRIDEMENT;  Surgeon: Tarry KosXu, Naiping M, MD;  Location: [facility];  Service: Orthopedics;  Laterality: Right;  . TONSILLECTOMY  1976    There were no vitals filed for this visit.   Subjective Assessment - 08/08/19 1509    Subjective  pt is a 68 y.o M with s/p R RCR, bicep tenodesis and SAD/DCR on 06/29/2019. pt is currently still using the sling PRN. He reports conintinued pain inthe shoulder limited ROM and strength. pain  stays mostly in the shoulder. since the surgery he reports improvement    Patient Stated Goals  return to lifting weights/ lifting    Currently in Pain?  Yes    Pain Score  3    last took medication 1 hour before treatment   Pain Location  Shoulder    Pain Orientation  Right    Pain Descriptors / Indicators  Aching    Pain Type  Surgical pain    Pain Onset  More than a month ago    Pain Frequency  Intermittent    Aggravating Factors   shoulder movement/ ROM    Pain Relieving Factors  medication    Effect of Pain on Daily Activities  limited RUE         [facility] PT Assessment - 08/08/19 1500      Assessment   Medical Diagnosis  R shoulder RCR, SAD bicep tenodesis    Referring Provider (PT)  Tarry KosXu, Naiping M, MD    Onset Date/Surgical Date  06/29/19    Hand Dominance  Right    Next MD Visit  08/10/2019    Prior Therapy  yes      Precautions   Precaution Comments  no lifting or carrying      Restrictions   Weight Bearing Restrictions  No  Balance Screen   Has the patient fallen in the past 6 months  Yes    How many times?  4    Has the patient had a decrease in activity level because of a fear of falling?   No    Is the patient reluctant to leave their home because of a fear of falling?   No      Home Environment   Living Environment  Private residence    Living Arrangements  Alone    Type of Home  Apartment    Home Access  Level entry    Home Layout  One level    Home Equipment  Crutches   sling     Prior Function   Level of Independence  Independent with basic ADLs    Vocation  On disability    Leisure  weight lifting      Cognition   Overall Cognitive Status  Within Functional Limits for tasks assessed      Observation/Other Assessments   Focus on Therapeutic Outcomes (FOTO)   48% limited   predicted 40% limited     Posture/Postural Control   Posture/Postural Control  Postural limitations      ROM / Strength   AROM / PROM / Strength  AROM;Strength;PROM       AROM   AROM Assessment Site  Shoulder    Right/Left Shoulder  Right;Left    Right Shoulder Extension  55 Degrees    Right Shoulder Flexion  110 Degrees    Right Shoulder ABduction  85 Degrees    Left Shoulder Extension  74 Degrees    Left Shoulder Flexion  145 Degrees    Left Shoulder ABduction  128 Degrees    Left Shoulder Internal Rotation  --   T8   Left Shoulder External Rotation  --   T5     PROM   PROM Assessment Site  Shoulder    Right/Left Shoulder  Right    Right Shoulder Flexion  110 Degrees    Right Shoulder ABduction  90 Degrees    Right Shoulder Internal Rotation  74 Degrees   with shoulder 45 degrees of abduction    Right Shoulder External Rotation  40 Degrees   with shoulder 45 degrees of abduction      Strength   Overall Strength Comments  did not assess R shoulder strength    Strength Assessment Site  Shoulder;Hand    Right/Left Shoulder  Right;Left    Left Shoulder Flexion  5/5    Left Shoulder Extension  5/5    Left Shoulder ABduction  5/5    Left Shoulder Internal Rotation  5/5    Left Shoulder External Rotation  5/5    Right/Left hand  Right;Left    Right Hand Grip (lbs)  64.6   64, 67, 63   Left Hand Grip (lbs)  79.6   79, 80,80     Palpation   Palpation comment   TTP noted along the sub-acromial space ,and tightness along the L upper trap                Objective measurements completed on examination: See above findings.      [facility]/Exercise - 08/08/19 1500      Exercises   Exercises  Shoulder      Shoulder Exercises: Seated   Retraction  Strengthening;10 reps;Both      Shoulder Exercises: ROM/Strengthening   Other ROM/Strengthening Exercises  wand flexion/abduction 2  x 10      Shoulder Exercises: Stretch   Other Shoulder Stretches  upper trap stretch 2 x 30 sec             PT Education - 08/08/19 1549    Education Details  evaluation findings, POC, goals, HEP with proper form/ rationale,     Person(s) Educated  Patient    Methods  Explanation;Verbal cues    Comprehension  Verbalized understanding       PT Short Term Goals - 08/08/19 1555      PT SHORT TERM GOAL #1   Title  pt to be I with inital HEP    Time  4    Period  Weeks    Target Date  09/05/19      PT SHORT TERM GOAL #2   Title  pt to verbalize and demo techniques to reduce pain and inflammation via RICE and HEP    Time  4    Period  Weeks    Status  New    Target Date  09/05/19      PT SHORT TERM GOAL #3   Title  increase grip strength by >/= 10# to demo improving shoulder function    Time  4    Period  Weeks    Status  New    Target Date  09/05/19        PT Long Term Goals - 08/08/19 1608      PT LONG TERM GOAL #1   Title  increase R shoulder ROM to [facility] compared bil with </= 1/10 pain for funcional mobility required for ADLs    Time  8    Period  Weeks    Status  New    Target Date  10/03/19      PT LONG TERM GOAL #2   Title  increase R shoulder strength to >/= 4+/5 grossly to promote shoulder stability with lifting and carrying activities    Time  8    Period  Weeks    Status  New    Target Date  10/03/19      PT LONG TERM GOAL #3   Title  pt to be able to return to home exercise routine per pt's personal goal with no pain    Time  8    Period  Weeks    Status  New    Target Date  10/03/19      PT LONG TERM GOAL #4   Title  increase FOTO score to </=40% limited to demo improvmeent in function    Time  8    Period  Weeks    Status  New    Target Date  10/03/19      PT LONG TERM GOAL #5   Title  pt to be I with all HEP given as of lsat visit to maintain and progress current level of function    Time  8    Period  Weeks    Status  New    Target Date  10/03/19             Plan - 08/08/19 1550    Clinical Impression Statement  pt presents to OPPT S/p R shoulder RCR, SAD, DCR and bicep tenodesis. He demonstrates limited AROM/PROm of R shoulder comped bil and associated  weakness. TTP noted along the sub-acromial space ,and tightness along the L upper trap. pt would benefit from physical therapy to decrease R shoulder pain, improve  ROM/ strength and maximize his function by addressing the deficits listed.    Personal Factors and Comorbidities  Comorbidity 3+    Comorbidities  hx or ptsd, anxiety, hx or TIA mulitple falls    Examination-Activity Limitations  Lift    Stability/Clinical Decision Making  Evolving/Moderate complexity    Clinical Decision Making  Moderate    Rehab Potential  Good    PT Frequency  2x / week    PT Duration  8 weeks    PT Treatment/Interventions  ADLs/Self Care Home Management;Cryotherapy;Electrical Stimulation;Iontophoresis 4mg /ml Dexamethasone;Moist Heat;Ultrasound;Therapeutic activities;Therapeutic exercise;Patient/family education;Neuromuscular re-education;Manual techniques;Taping;Vasopneumatic Device    PT Next Visit Plan  review/ update HEP, shoulder PROM > AAROM, scapular setting, gentle unweighted bicep curl , modalities PRN give scapular retraction    PT Home Exercise Plan  wand flexion/ abduction, upper trap stretch, gripping    Consulted and Agree with Plan of Care  Patient       Patient will benefit from skilled therapeutic intervention in order to improve the following deficits and impairments:  Improper body mechanics, Postural dysfunction, Pain, Decreased range of motion, Decreased activity tolerance, Decreased endurance, Decreased strength, Decreased knowledge of use of DME, Abnormal gait, Increased muscle spasms, Increased edema  Visit Diagnosis: Chronic right shoulder pain  Stiffness of right shoulder, not elsewhere classified  Muscle weakness (generalized)  Localized edema     Problem List Patient Active Problem List   Diagnosis Date Noted  . Impingement syndrome of right shoulder   . Acute respiratory failure with hypoxia ([place]) 04/23/2019  . Tear of right supraspinatus tendon 03/23/2019  . Arthrosis of  right acromioclavicular joint 03/23/2019  . Depression 01/24/2014  . Polysubstance (including opioids) dependence, daily use ([place]) 01/24/2014  . Chronic pain syndrome 01/24/2014  . Sepsis ([place]) 01/23/2014  . Altered mental status 01/23/2014  . Acute encephalopathy 11/19/2013  . SIRS (systemic inflammatory response syndrome) ([place]) 11/19/2013  . Community acquired pneumonia 11/19/2013  . GERD (gastroesophageal reflux disease) 11/19/2013  . Bipolar 1 disorder ([place]) 11/19/2013  . Lactic acidosis 11/19/2013  . Pneumonia 11/19/2013   Starr [place] PT, DPT, LAT, ATC  08/08/19  4:14 PM      [facility] [address] [place], [place], [address] Phone: [phone]   Fax:  [phone]  Name: Adarryl Goldammer MRN: [id number] Date of Birth: 22-Apr-1951

## 2019-08-10 ENCOUNTER — Ambulatory Visit (INDEPENDENT_AMBULATORY_CARE_PROVIDER_SITE_OTHER): Payer: No Typology Code available for payment source | Admitting: Orthopaedic Surgery

## 2019-08-10 ENCOUNTER — Encounter: Payer: Self-pay | Admitting: Orthopaedic Surgery

## 2019-08-10 ENCOUNTER — Telehealth: Payer: Self-pay

## 2019-08-10 DIAGNOSIS — Z9889 Other specified postprocedural states: Secondary | ICD-10-CM | POA: Insufficient documentation

## 2019-08-10 NOTE — Progress Notes (Signed)
Patient ID: Alan Brown, male   DOB: Oct 22, 1951, 68 y.o.   MRN: [id number]  Mr. Vonada is 6-week status post right rotator cuff repair.  He is doing well overall.  Reports minimal pain related to the shoulder.  Surgical scars are fully healed.  He has just started physical therapy recently.  Examination of the right shoulder shows forward flexion to 95 degrees with minimal discomfort.  Abduction to 100 degrees with minimal discomfort.  External rotation to 45 degrees without pain.  Overall I am happy with how he is moving his shoulder.  He is not compensating with a shrug.  At this point he can discontinue the sling and begin strengthening with PT.  We will continue to limit his lifting.  I would like to recheck him in 6 weeks.  Questions encouraged and answered.

## 2019-08-10 NOTE — Telephone Encounter (Signed)
Faxed Rx for PT to [facility] on [address] at [phone].

## 2019-08-11 ENCOUNTER — Other Ambulatory Visit: Payer: Self-pay

## 2019-08-11 ENCOUNTER — Ambulatory Visit: Payer: No Typology Code available for payment source | Admitting: Physical Therapy

## 2019-08-11 ENCOUNTER — Encounter: Payer: Self-pay | Admitting: Physical Therapy

## 2019-08-11 VITALS — BP 126/60 | HR 57

## 2019-08-11 DIAGNOSIS — G8929 Other chronic pain: Secondary | ICD-10-CM

## 2019-08-11 DIAGNOSIS — M25611 Stiffness of right shoulder, not elsewhere classified: Secondary | ICD-10-CM

## 2019-08-11 DIAGNOSIS — M6281 Muscle weakness (generalized): Secondary | ICD-10-CM

## 2019-08-11 DIAGNOSIS — R600 Localized edema: Secondary | ICD-10-CM

## 2019-08-11 DIAGNOSIS — M25511 Pain in right shoulder: Secondary | ICD-10-CM | POA: Diagnosis not present

## 2019-08-11 NOTE — Therapy (Signed)
[facility] [address] [place], [place], [address] Phone: [phone]   Fax:  [phone]  Physical Therapy Treatment  Patient Details  Name: Albino Mcclaran MRN: [id number] Date of Birth: Jul 15, 1951 Referring Provider (PT): Tarry Kos, MD   Encounter Date: 08/11/2019  PT End of Session - 08/11/19 1118    Visit Number  2    Number of Visits  16    Date for PT Re-Evaluation  10/03/19    Authorization Type  VA    Authorization Time Period  1 evaluation and 15 visits    Authorization - Visit Number  1    Authorization - Number of Visits  15    PT Start Time  1100    PT Stop Time  1142    PT Time Calculation (min)  42 min    Activity Tolerance  Patient tolerated treatment well    Behavior During Therapy  [facility] for tasks assessed/performed       Past Medical History:  Diagnosis Date  . Anxiety   . Arthritis    "all through my body" (01/26/2014)  . Bipolar 1 disorder (HCC)   . Chronic lower back pain   . Depression   . Depression   . GERD (gastroesophageal reflux disease)   . Pneumonia 1952; 11/2013  . Prolapse, disk    lower back  . Stroke [facility])    pt reported TIA    Past Surgical History:  Procedure Laterality Date  . APPENDECTOMY  1961  . INGUINAL HERNIA REPAIR Left 1998  . SHOULDER ARTHROSCOPY WITH ROTATOR CUFF REPAIR AND SUBACROMIAL DECOMPRESSION Right 06/29/2019   Procedure: RIGHT SHOULDER ARTHROSCOPY WITH ROTATOR CUFF REPAIR, BICEPS TENO SUBACROMIAL DECOMPRESSION, DISTAL CLAVICLE EXCISION, EXTENSIVE DEBRIDEMENT;  Surgeon: Tarry Kos, MD;  Location: [facility];  Service: Orthopedics;  Laterality: Right;  . TONSILLECTOMY  1976    Vitals:   08/11/19 1057  BP: 126/60  Pulse: (!) 57  SpO2: 93%    Subjective Assessment - 08/11/19 1057    Subjective  "I forgot to to do the exercises yesterday, I had some family conflict and I was stressed"    Patient Stated Goals  return to lifting  weights/ lifting    Currently in Pain?  Yes    Pain Score  0-No pain   last took medication for pain 60 ming ago   Pain Orientation  Right    Pain Descriptors / Indicators  Aching    Pain Onset  More than a month ago    Pain Frequency  Intermittent                       [facility] Adult PT Treatment/Exercise - 08/11/19 0001      Shoulder Exercises: Pulleys   Flexion  2 minutes    Scaption  2 minutes      Shoulder Exercises: ROM/Strengthening   Other ROM/Strengthening Exercises  wand flexion/abduction 2 x 10      Shoulder Exercises: Isometric Strengthening   Flexion  5X10"    Extension  5X10"    External Rotation  5X10"    Internal Rotation  5X10"      Shoulder Exercises: Stretch   Other Shoulder Stretches  upper trap stretch 2 x 30 sec      Manual Therapy   Manual Therapy  Joint mobilization;Passive ROM    Joint Mobilization  GHJ mobs grade II - inferior Ap/ PA  Passive ROM  working into end of availableROM flexion/ aduction with gentel distraction oscillations for pain       Prosthetics   Prosthetic Care Comments                 PT Education - 08/11/19 1118    Education Details  reviewed previously provided HEP and updated for isometrics    Person(s) Educated  Patient    Methods  Explanation;Verbal cues;Handout    Comprehension  Verbalized understanding;Verbal cues required       PT Short Term Goals - 08/08/19 1555      PT SHORT TERM GOAL #1   Title  pt to be I with inital HEP    Time  4    Period  Weeks    Target Date  09/05/19      PT SHORT TERM GOAL #2   Title  pt to verbalize and demo techniques to reduce pain and inflammation via RICE and HEP    Time  4    Period  Weeks    Status  New    Target Date  09/05/19      PT SHORT TERM GOAL #3   Title  increase grip strength by >/= 10# to demo improving shoulder function    Time  4    Period  Weeks    Status  New    Target Date  09/05/19        PT Long Term Goals - 08/08/19 1608       PT LONG TERM GOAL #1   Title  increase R shoulder ROM to [facility] compared bil with </= 1/10 pain for funcional mobility required for ADLs    Time  8    Period  Weeks    Status  New    Target Date  10/03/19      PT LONG TERM GOAL #2   Title  increase R shoulder strength to >/= 4+/5 grossly to promote shoulder stability with lifting and carrying activities    Time  8    Period  Weeks    Status  New    Target Date  10/03/19      PT LONG TERM GOAL #3   Title  pt to be able to return to home exercise routine per pt's personal goal with no pain    Time  8    Period  Weeks    Status  New    Target Date  10/03/19      PT LONG TERM GOAL #4   Title  increase FOTO score to </=40% limited to demo improvmeent in function    Time  8    Period  Weeks    Status  New    Target Date  10/03/19      PT LONG TERM GOAL #5   Title  pt to be I with all HEP given as of lsat visit to maintain and progress current level of function    Time  8    Period  Weeks    Status  New    Target Date  10/03/19            Plan - 08/11/19 1120    Clinical Impression Statement  pt returns for is follow up visit noting some consistency with HEP but dicussed having some family issues that limited his participation. reviewed HEp and updated for shoulder isometrics. worked on mobs and PROM to promote shoulder mobility. pt noted feeling off  and dizzy near the end of the session, [facility]   pt was given a butterscotch candy due to noting hx of hypoglycemia   PT Treatment/Interventions  ADLs/Self Care Home Management;Cryotherapy;Electrical Stimulation;Iontophoresis 4mg /ml Dexamethasone;Moist Heat;Ultrasound;Therapeutic activities;Therapeutic exercise;Patient/family education;Neuromuscular re-education;Manual techniques;Taping;Vasopneumatic Device    PT Next Visit Plan  review/ update HEP, shoulder PROM > AAROM, scapular setting, gentle unweighted bicep curl , modalities PRN give scapular retraction    PT  Home Exercise Plan  wand flexion/ abduction, upper trap stretch, gripping, flexion / extension, IR/ ER isometrics    Consulted and Agree with Plan of Care  Patient       Patient will benefit from skilled therapeutic intervention in order to improve the following deficits and impairments:  Improper body mechanics, Postural dysfunction, Pain, Decreased range of motion, Decreased activity tolerance, Decreased endurance, Decreased strength, Decreased knowledge of use of DME, Abnormal gait, Increased muscle spasms, Increased edema  Visit Diagnosis: Chronic right shoulder pain  Stiffness of right shoulder, not elsewhere classified  Muscle weakness (generalized)  Localized edema     Problem List Patient Active Problem List   Diagnosis Date Noted  . S/P right rotator cuff repair 08/10/2019  . Impingement syndrome of right shoulder   . Acute respiratory failure with hypoxia (HCC) 04/23/2019  . Tear of right supraspinatus tendon 03/23/2019  . Arthrosis of right acromioclavicular joint 03/23/2019  . Depression 01/24/2014  . Polysubstance (including opioids) dependence, daily use (HCC) 01/24/2014  . Chronic pain syndrome 01/24/2014  . Sepsis (HCC) 01/23/2014  . Altered mental status 01/23/2014  . Acute encephalopathy 11/19/2013  . SIRS (systemic inflammatory response syndrome) (HCC) 11/19/2013  . Community acquired pneumonia 11/19/2013  . GERD (gastroesophageal reflux disease) 11/19/2013  . Bipolar 1 disorder (HCC) 11/19/2013  . Lactic acidosis 11/19/2013  . Pneumonia 11/19/2013   Lulu RidingKristoffer Zyren Sevigny PT, DPT, LAT, ATC  08/11/19  11:38 AM      [facility] [address] [place], [place], [address] Phone: [phone]   Fax:  [phone]  Name: Jacqualine MauJames Richard Amor MRN: [id number] Date of Birth: Feb 16, 1951

## 2019-08-17 ENCOUNTER — Other Ambulatory Visit: Payer: Self-pay

## 2019-08-17 ENCOUNTER — Ambulatory Visit: Payer: No Typology Code available for payment source | Attending: Orthopaedic Surgery | Admitting: Physical Therapy

## 2019-08-17 ENCOUNTER — Encounter: Payer: Self-pay | Admitting: Physical Therapy

## 2019-08-17 DIAGNOSIS — M25611 Stiffness of right shoulder, not elsewhere classified: Secondary | ICD-10-CM | POA: Insufficient documentation

## 2019-08-17 DIAGNOSIS — R600 Localized edema: Secondary | ICD-10-CM | POA: Diagnosis present

## 2019-08-17 DIAGNOSIS — G8929 Other chronic pain: Secondary | ICD-10-CM | POA: Insufficient documentation

## 2019-08-17 DIAGNOSIS — M6281 Muscle weakness (generalized): Secondary | ICD-10-CM | POA: Diagnosis present

## 2019-08-17 DIAGNOSIS — M25511 Pain in right shoulder: Secondary | ICD-10-CM | POA: Diagnosis not present

## 2019-08-17 NOTE — Therapy (Signed)
[facility] [address] [place], [place], [address] Phone: [phone]   Fax:  [phone]  Physical Therapy Treatment  Patient Details  Name: Alan Brown MRN: [id number] Date of Birth: [birth year] Referring Provider (PT): Tarry Kos, MD   Encounter Date: 08/17/2019  PT End of Session - 08/17/19 1450    Visit Number  3    Number of Visits  16    Date for PT Re-Evaluation  10/03/19    Authorization Type  VA    Authorization Time Period  1 evaluation and 15 visits    Authorization - Visit Number  3    Authorization - Number of Visits  15    PT Start Time  1100    PT Stop Time  1140    PT Time Calculation (min)  40 min    Activity Tolerance  Patient tolerated treatment well    Behavior During Therapy  [facility] for tasks assessed/performed       Past Medical History:  Diagnosis Date  . Anxiety   . Arthritis    "all through my body" (01/26/2014)  . Bipolar 1 disorder (HCC)   . Chronic lower back pain   . Depression   . Depression   . GERD (gastroesophageal reflux disease)   . Pneumonia 1952; 11/2013  . Prolapse, disk    lower back  . Stroke [facility])    pt reported TIA    Past Surgical History:  Procedure Laterality Date  . APPENDECTOMY  1961  . INGUINAL HERNIA REPAIR Left 1998  . SHOULDER ARTHROSCOPY WITH ROTATOR CUFF REPAIR AND SUBACROMIAL DECOMPRESSION Right 06/29/2019   Procedure: RIGHT SHOULDER ARTHROSCOPY WITH ROTATOR CUFF REPAIR, BICEPS TENO SUBACROMIAL DECOMPRESSION, DISTAL CLAVICLE EXCISION, EXTENSIVE DEBRIDEMENT;  Surgeon: Tarry Kos, MD;  Location: [facility];  Service: Orthopedics;  Laterality: Right;  . TONSILLECTOMY  1976    There were no vitals filed for this visit.  Subjective Assessment - 08/17/19 1106    Subjective  Patient reports he has been having issues at home so he hasnt been able to do any exercises at home.    Patient Stated Goals  return to lifting weights/ lifting     Currently in Pain?  Yes    Pain Score  3     Pain Location  Shoulder    Pain Orientation  Right    Pain Descriptors / Indicators  Aching    Pain Type  Chronic pain    Pain Onset  More than a month ago    Pain Frequency  Intermittent    Aggravating Factors   shoulder movement    Pain Relieving Factors  medication    Effect of Pain on Daily Activities  not able to use right UE.                       [facility] Adult PT Treatment/Exercise - 08/17/19 0001      Shoulder Exercises: Supine   Other Supine Exercises  wand flexion with reverse chest press on the decline 2nd to pain; 3x5       Shoulder Exercises: Standing   Retraction  10 reps    Theraband Level (Shoulder Retraction)  Level 1 (Yellow)    Retraction Limitations  2x10 mod cuing for posture and techniuqe. Patient shown how to put the band in the door       Shoulder Exercises: Pulleys   Flexion  2 minutes  Flexion Limitations  mod cuing for technique     Scaption  --      Shoulder Exercises: Isometric Strengthening   Flexion  5X10"    Extension  --    External Rotation  5X10"    Internal Rotation  5X10"      Shoulder Exercises: Stretch   Other Shoulder Stretches  upper trap stretch 2 x 30 sec      Manual Therapy   Manual Therapy  Joint mobilization;Passive ROM    Joint Mobilization  GHJ mobs grade II - inferior Ap/ PA    Passive ROM  working into end of availableROM flexion/ aduction with gentel distraction oscillations for pain                PT Short Term Goals - 08/17/19 1452      PT SHORT TERM GOAL #1   Title  pt to be I with inital HEP    Time  4    Period  Weeks    Status  On-going      PT SHORT TERM GOAL #2   Title  pt to verbalize and demo techniques to reduce pain and inflammation via RICE and HEP    Time  4    Period  Weeks    Status  On-going      PT SHORT TERM GOAL #3   Title  increase grip strength by >/= 10# to demo improving shoulder function    Time  4    Period   Weeks    Status  On-going        PT Long Term Goals - 08/08/19 1608      PT LONG TERM GOAL #1   Title  increase R shoulder ROM to [facility] compared bil with </= 1/10 pain for funcional mobility required for ADLs    Time  8    Period  Weeks    Status  New    Target Date  10/03/19      PT LONG TERM GOAL #2   Title  increase R shoulder strength to >/= 4+/5 grossly to promote shoulder stability with lifting and carrying activities    Time  8    Period  Weeks    Status  New    Target Date  10/03/19      PT LONG TERM GOAL #3   Title  pt to be able to return to home exercise routine per pt's personal goal with no pain    Time  8    Period  Weeks    Status  New    Target Date  10/03/19      PT LONG TERM GOAL #4   Title  increase FOTO score to </=40% limited to demo improvmeent in function    Time  8    Period  Weeks    Status  New    Target Date  10/03/19      PT LONG TERM GOAL #5   Title  pt to be I with all HEP given as of lsat visit to maintain and progress current level of function    Time  8    Period  Weeks    Status  New    Target Date  10/03/19            Plan - 08/17/19 1450    Clinical Impression Statement  Patient requires cuing for his ther-ex. He has done some of the exercises before but reports he  has a congative deficit. He was storngly advised if he does not feel like he is doing the exercises correctly at home to coem talk to PT about them. Per visual inspection his ER is excellent. His IR is limited.    Personal Factors and Comorbidities  Comorbidity 3+    Comorbidities  hx or ptsd, anxiety, hx or TIA mulitple falls    Examination-Activity Limitations  Lift    Stability/Clinical Decision Making  Evolving/Moderate complexity    Clinical Decision Making  Moderate    Rehab Potential  Good    PT Frequency  2x / week    PT Duration  8 weeks    PT Treatment/Interventions  ADLs/Self Care Home Management;Cryotherapy;Electrical Stimulation;Iontophoresis 4mg /ml  Dexamethasone;Moist Heat;Ultrasound;Therapeutic activities;Therapeutic exercise;Patient/family education;Neuromuscular re-education;Manual techniques;Taping;Vasopneumatic Device    PT Next Visit Plan  review/ update HEP, shoulder PROM > AAROM, scapular setting, gentle unweighted bicep curl , modalities PRN give scapular retraction    PT Home Exercise Plan  wand flexion/ abduction, upper trap stretch, gripping, flexion / extension, IR/ ER isometrics    Consulted and Agree with Plan of Care  Patient       Patient will benefit from skilled therapeutic intervention in order to improve the following deficits and impairments:  Improper body mechanics, Postural dysfunction, Pain, Decreased range of motion, Decreased activity tolerance, Decreased endurance, Decreased strength, Decreased knowledge of use of DME, Abnormal gait, Increased muscle spasms, Increased edema  Visit Diagnosis: Chronic right shoulder pain  Stiffness of right shoulder, not elsewhere classified  Muscle weakness (generalized)  Localized edema     Problem List Patient Active Problem List   Diagnosis Date Noted  . S/P right rotator cuff repair 08/10/2019  . Impingement syndrome of right shoulder   . Acute respiratory failure with hypoxia (HCC) 04/23/2019  . Tear of right supraspinatus tendon 03/23/2019  . Arthrosis of right acromioclavicular joint 03/23/2019  . Depression 01/24/2014  . Polysubstance (including opioids) dependence, daily use (HCC) 01/24/2014  . Chronic pain syndrome 01/24/2014  . Sepsis (HCC) 01/23/2014  . Altered mental status 01/23/2014  . Acute encephalopathy 11/19/2013  . SIRS (systemic inflammatory response syndrome) (HCC) 11/19/2013  . Community acquired pneumonia 11/19/2013  . GERD (gastroesophageal reflux disease) 11/19/2013  . Bipolar 1 disorder (HCC) 11/19/2013  . Lactic acidosis 11/19/2013  . Pneumonia 11/19/2013    Dessie Comaavid J Marcene Laskowski PT DPT  08/17/2019, 2:54 PM  [facility] [address] [place], [place], [address] Phone: [phone]   Fax:  [phone]  Name: Alan Brown MRN: [id number] Date of Birth: 1950/12/31

## 2019-08-19 ENCOUNTER — Encounter

## 2019-08-26 ENCOUNTER — Encounter

## 2019-08-29 ENCOUNTER — Encounter: Payer: Self-pay | Admitting: Physical Therapy

## 2019-08-29 ENCOUNTER — Ambulatory Visit: Payer: No Typology Code available for payment source | Admitting: Physical Therapy

## 2019-08-29 ENCOUNTER — Telehealth: Payer: Self-pay

## 2019-08-29 ENCOUNTER — Other Ambulatory Visit: Payer: Self-pay

## 2019-08-29 DIAGNOSIS — M25511 Pain in right shoulder: Secondary | ICD-10-CM | POA: Diagnosis not present

## 2019-08-29 DIAGNOSIS — M6281 Muscle weakness (generalized): Secondary | ICD-10-CM

## 2019-08-29 DIAGNOSIS — R600 Localized edema: Secondary | ICD-10-CM

## 2019-08-29 DIAGNOSIS — M25611 Stiffness of right shoulder, not elsewhere classified: Secondary | ICD-10-CM

## 2019-08-29 DIAGNOSIS — G8929 Other chronic pain: Secondary | ICD-10-CM

## 2019-08-29 NOTE — Telephone Encounter (Signed)
FYI

## 2019-08-29 NOTE — Telephone Encounter (Signed)
Patient wanted to let Dr. Erlinda Hong know that he hurt his right shoulder while doing PT exercises 12 days ago.  Stated that he has some bruising and pain in right shoulder blade when moving.  Returned to PT today and did some light exercises.  Cb# is [phone]. Thank you.

## 2019-08-29 NOTE — Therapy (Signed)
[facility] [address] [place], [place], [address] Phone: [phone]   Fax:  [phone]  Physical Therapy Treatment  Patient Details  Name: Alan Brown MRN: [id number] Date of Birth: 09-07-1951 Referring Provider (PT): Tarry KosXu, Naiping M, MD   Encounter Date: 08/29/2019  PT End of Session - 08/29/19 1352    Visit Number  4    Number of Visits  16    Date for PT Re-Evaluation  10/03/19    Authorization Type  VA    Authorization Time Period  1 evaluation and 15 visits    Authorization - Visit Number  4    Authorization - Number of Visits  15    PT Start Time  1330    PT Stop Time  1408    PT Time Calculation (min)  38 min    Activity Tolerance  Patient tolerated treatment well    Behavior During Therapy  [facility] for tasks assessed/performed       Past Medical History:  Diagnosis Date  . Anxiety   . Arthritis    "all through my body" (01/26/2014)  . Bipolar 1 disorder (HCC)   . Chronic lower back pain   . Depression   . Depression   . GERD (gastroesophageal reflux disease)   . Pneumonia 1952; 11/2013  . Prolapse, disk    lower back  . Stroke [facility](HCC)    pt reported TIA    Past Surgical History:  Procedure Laterality Date  . APPENDECTOMY  1961  . INGUINAL HERNIA REPAIR Left 1998  . SHOULDER ARTHROSCOPY WITH ROTATOR CUFF REPAIR AND SUBACROMIAL DECOMPRESSION Right 06/29/2019   Procedure: RIGHT SHOULDER ARTHROSCOPY WITH ROTATOR CUFF REPAIR, BICEPS TENO SUBACROMIAL DECOMPRESSION, DISTAL CLAVICLE EXCISION, EXTENSIVE DEBRIDEMENT;  Surgeon: Tarry KosXu, Naiping M, MD;  Location: [facility];  Service: Orthopedics;  Laterality: Right;  . TONSILLECTOMY  1976    There were no vitals filed for this visit.  Subjective Assessment - 08/29/19 1335    Subjective  "I was doing the exericse where I was laying on my back and using the [facility] raising above my head and felt a pop and it started hurting and hasn't stopped"     Patient Stated Goals  return to lifting weights/ lifting    Currently in Pain?  Yes    Pain Score  5     Pain Location  Shoulder    Pain Orientation  Right         [facility] PT Assessment - 08/29/19 0001      Assessment   Medical Diagnosis  R shoulder RCR, SAD bicep tenodesis    Referring Provider (PT)  Tarry KosXu, Naiping M, MD    Hand Dominance  Right    Next MD Visit  08/10/2019      Observation/Other Assessments   Skin Integrity   an open wound located on the Lside of the occipital bone which appears wet and formation of slough and visually measures approximately 1/2 inch long and 1/4 inch wide.   he reports its from OCD and is unable to stop aggrivating it     AROM   Left Shoulder Flexion  15 Degrees   93 degrees at end of session     Palpation   Palpation comment  significant tenderness at the infraspinatus and teres minor                   [facility] Adult PT Treatment/Exercise - 08/29/19 0001  Shoulder Exercises: Seated   Retraction  12 reps;Strengthening;Both    Other Seated Exercises  table slides flexion/ abduction 1 x 12 ea.   updated as HEP     Shoulder Exercises: Pulleys   Flexion  2 minutes    Scaption  2 minutes      Shoulder Exercises: Isometric Strengthening   External Rotation  5X10"    Internal Rotation  5X10"      Manual Therapy   Manual Therapy  Soft tissue mobilization    Joint Mobilization  GHJ mobs grade II - inferior Ap/ PA    Soft tissue mobilization  iASTM along the infraspinatus, and teres minor             PT Education - 08/29/19 1352    Education Details  reviewed previous HEP an dupdated for table slides to work on gentle ROM. discussed potential benefit to calling his MD for to assess his R shoulder    Person(s) Educated  Patient    Methods  Explanation;Verbal cues;Handout    Comprehension  Verbalized understanding;Verbal cues required       PT Short Term Goals - 08/17/19 1452      PT SHORT TERM GOAL #1   Title  pt to  be I with inital HEP    Time  4    Period  Weeks    Status  On-going      PT SHORT TERM GOAL #2   Title  pt to verbalize and demo techniques to reduce pain and inflammation via RICE and HEP    Time  4    Period  Weeks    Status  On-going      PT SHORT TERM GOAL #3   Title  increase grip strength by >/= 10# to demo improving shoulder function    Time  4    Period  Weeks    Status  On-going        PT Long Term Goals - 08/08/19 1608      PT LONG TERM GOAL #1   Title  increase R shoulder ROM to [facility] compared bil with </= 1/10 pain for funcional mobility required for ADLs    Time  8    Period  Weeks    Status  New    Target Date  10/03/19      PT LONG TERM GOAL #2   Title  increase R shoulder strength to >/= 4+/5 grossly to promote shoulder stability with lifting and carrying activities    Time  8    Period  Weeks    Status  New    Target Date  10/03/19      PT LONG TERM GOAL #3   Title  pt to be able to return to home exercise routine per pt's personal goal with no pain    Time  8    Period  Weeks    Status  New    Target Date  10/03/19      PT LONG TERM GOAL #4   Title  increase FOTO score to </=40% limited to demo improvmeent in function    Time  8    Period  Weeks    Status  New    Target Date  10/03/19      PT LONG TERM GOAL #5   Title  pt to be I with all HEP given as of lsat visit to maintain and progress current level of function    Time  8    Period  Weeks    Status  New    Target Date  10/03/19            Plan - 08/29/19 1354    Clinical Impression Statement  pt presents to PT reporting increased pain and limited ROM secodnary to feeling a pop and having significant pain and limited ROM that occured during supine wand flexion while at home. He reports he hasn't done anything since this occurred including modalites for pain or HEP. following session he was able to actively flex the shoulder to 93 degrees with no visual signs of distress. He declined  modalities end of session.   on a seperate note he does also have an open wound located on the Lside of the occipital bone which appears wet and formation of slough and visually measures approximately 1/2 inch long and 1/4 inch wide.      Patient will benefit from skilled therapeutic intervention in order to improve the following deficits and impairments:  Improper body mechanics, Postural dysfunction, Pain, Decreased range of motion, Decreased activity tolerance, Decreased endurance, Decreased strength, Decreased knowledge of use of DME, Abnormal gait, Increased muscle spasms, Increased edema  Visit Diagnosis: Chronic right shoulder pain  Stiffness of right shoulder, not elsewhere classified  Muscle weakness (generalized)  Localized edema     Problem List Patient Active Problem List   Diagnosis Date Noted  . S/P right rotator cuff repair 08/10/2019  . Impingement syndrome of right shoulder   . Acute respiratory failure with hypoxia ([place]) 04/23/2019  . Tear of right supraspinatus tendon 03/23/2019  . Arthrosis of right acromioclavicular joint 03/23/2019  . Depression 01/24/2014  . Polysubstance (including opioids) dependence, daily use ([place]) 01/24/2014  . Chronic pain syndrome 01/24/2014  . Sepsis ([place]) 01/23/2014  . Altered mental status 01/23/2014  . Acute encephalopathy 11/19/2013  . SIRS (systemic inflammatory response syndrome) ([place]) 11/19/2013  . Community acquired pneumonia 11/19/2013  . GERD (gastroesophageal reflux disease) 11/19/2013  . Bipolar 1 disorder ([place]) 11/19/2013  . Lactic acidosis 11/19/2013  . Pneumonia 11/19/2013   Starr Lake PT, DPT, LAT, ATC  08/29/19  2:12 PM      [facility] [address] [place], [place], [address] Phone: [phone]   Fax:  [phone]  Name: Alan Brown MRN: [id number] Date of Birth: 12-26-1950

## 2019-08-31 ENCOUNTER — Telehealth: Payer: Self-pay | Admitting: Physical Therapy

## 2019-08-31 ENCOUNTER — Ambulatory Visit: Payer: No Typology Code available for payment source | Admitting: Physical Therapy

## 2019-08-31 NOTE — Telephone Encounter (Signed)
LVM regarding missed appointment today. Noted when his next scheduled appointment is and if he is unable to attend that visit to call us and we can cancel or reschedule that appointment.

## 2019-09-05 ENCOUNTER — Ambulatory Visit: Payer: No Typology Code available for payment source | Admitting: Physical Therapy

## 2019-09-05 ENCOUNTER — Telehealth: Payer: Self-pay

## 2019-09-05 ENCOUNTER — Other Ambulatory Visit: Payer: Self-pay

## 2019-09-05 DIAGNOSIS — R600 Localized edema: Secondary | ICD-10-CM

## 2019-09-05 DIAGNOSIS — M25511 Pain in right shoulder: Secondary | ICD-10-CM

## 2019-09-05 DIAGNOSIS — G8929 Other chronic pain: Secondary | ICD-10-CM

## 2019-09-05 DIAGNOSIS — M25611 Stiffness of right shoulder, not elsewhere classified: Secondary | ICD-10-CM

## 2019-09-05 DIAGNOSIS — M6281 Muscle weakness (generalized): Secondary | ICD-10-CM

## 2019-09-05 NOTE — Telephone Encounter (Signed)
Patient called stating that he had just left PT and was advised to call us due to having a lot of pain in the right shoulder during PT today.  Would like to Know what Dr. Erlinda Hong suggest?  Stated that something is not right with the right shoulder.  Cb# is [phone].  Please advise.  Thank you.

## 2019-09-05 NOTE — Telephone Encounter (Signed)
Should come back in

## 2019-09-05 NOTE — Therapy (Addendum)
[place], [place], [address] Phone: [phone]   Fax:  [phone]  Physical Therapy Treatment / Discharge  Patient Details  Name: Alan Brown MRN: [id number] Date of Birth: 10/23/1951 Referring Provider (PT): Leandrew Koyanagi, MD   Encounter Date: 09/05/2019  PT End of Session - 09/05/19 1328    Visit Number  5    Number of Visits  16    Date for PT Re-Evaluation  10/03/19    Authorization Type  [facility]    Authorization Time Period  1 evaluation and 15 visits    Authorization - Visit Number  5    Authorization - Number of Visits  15    PT Start Time  4270    PT Stop Time  1413    PT Time Calculation (min)  45 min    Activity Tolerance  Patient tolerated treatment well    Behavior During Therapy  [facility] for tasks assessed/performed       Past Medical History:  Diagnosis Date  . Anxiety   . Arthritis    "all through my body" (01/26/2014)  . Bipolar 1 disorder ([place])   . Chronic lower back pain   . Depression   . Depression   . GERD (gastroesophageal reflux disease)   . Pneumonia 1952; 11/2013  . Prolapse, disk    lower back  . Stroke [facility])    pt reported TIA    Past Surgical History:  Procedure Laterality Date  . APPENDECTOMY  1961  . INGUINAL HERNIA REPAIR Left 1998  . SHOULDER ARTHROSCOPY WITH ROTATOR CUFF REPAIR AND SUBACROMIAL DECOMPRESSION Right 06/29/2019   Procedure: RIGHT SHOULDER ARTHROSCOPY WITH ROTATOR CUFF REPAIR, BICEPS TENO SUBACROMIAL DECOMPRESSION, DISTAL CLAVICLE EXCISION, EXTENSIVE DEBRIDEMENT;  Surgeon: Leandrew Koyanagi, MD;  Location: [place];  Service: Orthopedics;  Laterality: Right;  . TONSILLECTOMY  1976    There were no vitals filed for this visit.  Subjective Assessment - 09/05/19 1329    Subjective  " I am still having soreness in the shoulder and I am still doing the exercise which don't seem to be helping"    Currently in Pain?  Yes    Pain  Score  7     Pain Orientation  Right         [facility] PT Assessment - 09/05/19 0001      Assessment   Medical Diagnosis  R shoulder RCR, SAD bicep tenodesis    Referring Provider (PT)  Leandrew Koyanagi, MD    Onset Date/Surgical Date  06/29/19    Hand Dominance  Right      AROM   Right Shoulder Extension  50 Degrees    Right Shoulder Flexion  58 Degrees    Right Shoulder ABduction  70 Degrees      PROM   Right Shoulder Flexion  98 Degrees    Right Shoulder ABduction  75 Degrees      Palpation   Palpation comment  significant tenderness at the infraspinatus and teres minor, and along the long head of the bicep tendon                   [facility] Adult PT Treatment/Exercise - 09/05/19 0001      Modalities   Modalities  Electrical Stimulation;Moist Heat      Moist Heat Therapy   Number Minutes Moist Heat  15 Minutes    Moist Heat Location  Shoulder   R  shoulder     Electrical Stimulation   Electrical Stimulation Location  R shoulder    Electrical Stimulation Action  IFC    Electrical Stimulation Parameters  L7 x 15 min 100% scan    Electrical Stimulation Goals  Pain      Manual Therapy   Joint Mobilization  GHJ mobs grade II - inferior Ap/ PA combined with Grade 1 distraction.    Passive ROM  working into end of available ROM flexion/ abduction              PT Education - 09/05/19 1404    Education Details  discussed with pt to avoid going to extreme end ranges of motion to gradually work ROM with table slides.    Person(s) Educated  Patient    Methods  Explanation;Verbal cues;Handout    Comprehension  Verbalized understanding;Verbal cues required       PT Short Term Goals - 08/17/19 1452      PT SHORT TERM GOAL #1   Title  pt to be I with inital HEP    Time  4    Period  Weeks    Status  On-going      PT SHORT TERM GOAL #2   Title  pt to verbalize and demo techniques to reduce pain and inflammation via RICE and HEP    Time  4    Period  Weeks     Status  On-going      PT SHORT TERM GOAL #3   Title  increase grip strength by >/= 10# to demo improving shoulder function    Time  4    Period  Weeks    Status  On-going        PT Long Term Goals - 08/08/19 1608      PT LONG TERM GOAL #1   Title  increase R shoulder ROM to [facility] compared bil with </= 1/10 pain for funcional mobility required for ADLs    Time  8    Period  Weeks    Status  New    Target Date  10/03/19      PT LONG TERM GOAL #2   Title  increase R shoulder strength to >/= 4+/5 grossly to promote shoulder stability with lifting and carrying activities    Time  8    Period  Weeks    Status  New    Target Date  10/03/19      PT LONG TERM GOAL #3   Title  pt to be able to return to home exercise routine per pt's personal goal with no pain    Time  8    Period  Weeks    Status  New    Target Date  10/03/19      PT LONG TERM GOAL #4   Title  increase FOTO score to </=40% limited to demo improvmeent in function    Time  8    Period  Weeks    Status  New    Target Date  10/03/19      PT LONG TERM GOAL #5   Title  pt to be I with all HEP given as of lsat visit to maintain and progress current level of function    Time  8    Period  Weeks    Status  New    Target Date  10/03/19            Plan - 09/05/19 1405  Clinical Impression Statement  pt reports continued pain in the R shoulder that is worse with activity and exericses and reports pain located at the infraspinatus and along the long head bicep tendon. Focused on pain relief techniques with grade I-II mobs and e-stim / MHP. Called his MD and left a message regarding pt's current complaints. no changes in pain at the end of the session.    PT Treatment/Interventions  ADLs/Self Care Home Management;Cryotherapy;Electrical Stimulation;Iontophoresis 87m/ml Dexamethasone;Moist Heat;Ultrasound;Therapeutic activities;Therapeutic exercise;Patient/family education;Neuromuscular re-education;Manual  techniques;Taping;Vasopneumatic Device    PT Next Visit Plan  review/ update HEP, shoulder PROM > AAROM, scapular setting, gentle unweighted bicep curl , modalities PRN give scapular retraction did he get in with his MD    PT Home Exercise Plan  wand flexion/ abduction, upper trap stretch, gripping, flexion / extension, IR/ ER isometrics       Patient will benefit from skilled therapeutic intervention in order to improve the following deficits and impairments:  Improper body mechanics, Postural dysfunction, Pain, Decreased range of motion, Decreased activity tolerance, Decreased endurance, Decreased strength, Decreased knowledge of use of DME, Abnormal gait, Increased muscle spasms, Increased edema  Visit Diagnosis: Chronic right shoulder pain  Stiffness of right shoulder, not elsewhere classified  Muscle weakness (generalized)  Localized edema     Problem List Patient Active Problem List   Diagnosis Date Noted  . S/P right rotator cuff repair 08/10/2019  . Impingement syndrome of right shoulder   . Acute respiratory failure with hypoxia ([place] 04/23/2019  . Tear of right supraspinatus tendon 03/23/2019  . Arthrosis of right acromioclavicular joint 03/23/2019  . Depression 01/24/2014  . Polysubstance (including opioids) dependence, daily use ([facility] 01/24/2014  . Chronic pain syndrome 01/24/2014  . Sepsis ([place] 01/23/2014  . Altered mental status 01/23/2014  . Acute encephalopathy 11/19/2013  . SIRS (systemic inflammatory response syndrome) ([facility] 11/19/2013  . Community acquired pneumonia 11/19/2013  . GERD (gastroesophageal reflux disease) 11/19/2013  . Bipolar 1 disorder ([place] 11/19/2013  . Lactic acidosis 11/19/2013  . Pneumonia 11/19/2013   KStarr LakePT, DPT, LAT, ATC  09/05/19  2:09 PM      [facility] [place] [place] [address]Phone: [phone]  Fax:  [phone] Name: JMonti JilekMRN: [id number] of Birth: [birth year]     PHYSICAL THERAPY DISCHARGE SUMMARY  Visits from Start of Care: 5  Current functional level related to goals / functional outcomes: See goals   Remaining deficits: unknown   Education / Equipment: HEP  Plan: Patient agrees to discharge.  Patient goals were not met. Patient is being discharged due to not returning since the last visit.  ?????          Anglia Blakley PT, DPT, LAT, ATC  10/19/19  1:29 PM

## 2019-09-05 NOTE — Telephone Encounter (Signed)
Noted. Patient has appointment, Tuesday, 09/06/2019

## 2019-09-05 NOTE — Telephone Encounter (Signed)
Received VM from HHPT stating patient having severe shoulder pain. I called patient to schedule OV with Dr Erlinda Hong for evaluation, no answer. LMVM for patient to call office to schedule appt.

## 2019-09-06 ENCOUNTER — Ambulatory Visit (INDEPENDENT_AMBULATORY_CARE_PROVIDER_SITE_OTHER): Payer: No Typology Code available for payment source | Admitting: Physician Assistant

## 2019-09-06 ENCOUNTER — Encounter: Payer: Self-pay | Admitting: Orthopaedic Surgery

## 2019-09-06 DIAGNOSIS — Z9889 Other specified postprocedural states: Secondary | ICD-10-CM

## 2019-09-06 DIAGNOSIS — M25511 Pain in right shoulder: Secondary | ICD-10-CM

## 2019-09-06 NOTE — Progress Notes (Signed)
Post-Op Visit Note   Patient: Alan Brown           Date of Birth: 07-May-1951           MRN: [id number] Visit Date: 09/06/2019 PCP: Clovis Riley, L.August Saucer, MD   Assessment & Plan:  Chief Complaint:  Chief Complaint  Patient presents with  . Right Shoulder - Follow-up   Visit Diagnoses:  1. S/P right rotator cuff repair     Plan: Patient is a pleasant 68 year old gentleman who presents our clinic today approximately 2-1/2 months status post right shoulder arthroscopic debridement, decompression, rotator cuff repair (supraspinatus) and biceps tenotomy, date of surgery 06/29/2019.  He is doing well in regards to pain and progressing with physical therapy until about 1 to 2 weeks ago.  He is on his bed lying supine when he had his hands holding a bar over his shoulder but about 180 degrees.  This was a new exercise provided by physical therapy.  He all of the sudden felt to give way sensation and immediate pain to the right shoulder.  He has had significant pain and weakness since.  He denied any increased range of motion following the incident.  Examination of the right shoulder reveals forward flexion to about 45 degrees.  Positive drop arm.  He is neurovascularly intact distally.  At this point, I am concerned for a re-tear of his rotator cuff.  We will order an MRI arthrogram to further assess this.  He will follow-up with [place] once that has been completed.  Follow-Up Instructions: Return for after MRI.   Orders:  No orders of the defined types were placed in this encounter.  No orders of the defined types were placed in this encounter.   Imaging: No new imaging  PMFS History: Patient Active Problem List   Diagnosis Date Noted  . S/P right rotator cuff repair 08/10/2019  . Impingement syndrome of right shoulder   . Acute respiratory failure with hypoxia ([facility]) 04/23/2019  . Tear of right supraspinatus tendon 03/23/2019  . Arthrosis of right acromioclavicular joint  03/23/2019  . Depression 01/24/2014  . Polysubstance (including opioids) dependence, daily use ([facility]) 01/24/2014  . Chronic pain syndrome 01/24/2014  . Sepsis ([facility]) 01/23/2014  . Altered mental status 01/23/2014  . Acute encephalopathy 11/19/2013  . SIRS (systemic inflammatory response syndrome) ([facility]) 11/19/2013  . Community acquired pneumonia 11/19/2013  . GERD (gastroesophageal reflux disease) 11/19/2013  . Bipolar 1 disorder ([facility]) 11/19/2013  . Lactic acidosis 11/19/2013  . Pneumonia 11/19/2013   Past Medical History:  Diagnosis Date  . Anxiety   . Arthritis    "all through my body" (01/26/2014)  . Bipolar 1 disorder ([facility])   . Chronic lower back pain   . Depression   . Depression   . GERD (gastroesophageal reflux disease)   . Pneumonia 1952; 11/2013  . Prolapse, disk    lower back  . Stroke [facility])    pt reported TIA    Family History  Problem Relation Age of Onset  . Hypertension Mother   . Hypertension Father   . Aneurysm Father   . Other Sister        killed  . Diabetes Sister   . Heart disease Sister     Past Surgical History:  Procedure Laterality Date  . APPENDECTOMY  1961  . INGUINAL HERNIA REPAIR Left 1998  . SHOULDER ARTHROSCOPY WITH ROTATOR CUFF REPAIR AND SUBACROMIAL DECOMPRESSION Right 06/29/2019   Procedure: RIGHT SHOULDER ARTHROSCOPY WITH ROTATOR CUFF  REPAIR, BICEPS TENO SUBACROMIAL DECOMPRESSION, DISTAL CLAVICLE EXCISION, EXTENSIVE DEBRIDEMENT;  Surgeon: Leandrew Koyanagi, MD;  Location: [place];  Service: Orthopedics;  Laterality: Right;  . TONSILLECTOMY  1976   Social History   Occupational History  . Occupation: disabled veteran  Tobacco Use  . Smoking status: Former Smoker    Packs/day: 1.00    Years: 25.00    Pack years: 25.00    Types: Cigarettes  . Smokeless tobacco: Never Used  . Tobacco comment: 01/26/2014 "quit smoking 15-16 yr ago"  Substance and Sexual Activity  . Alcohol use: No    Frequency: Never    Comment:   "quit drinking 25 yr ago; I'm a recovering alcoholic"  . Drug use: No  . Sexual activity: Not Currently

## 2019-09-06 NOTE — Addendum Note (Signed)
Addended byLaurann Montana on: 09/06/2019 11:15 AM   Modules accepted: Orders

## 2019-09-07 ENCOUNTER — Telehealth: Payer: Self-pay

## 2019-09-07 NOTE — Telephone Encounter (Signed)
Pt called and states that he was supposed to have an MRI of his shoulder and that his VA benefit is only until 09/12/19. His appointment for the MRI is not unitl 09/27/19. Is there anything that we can do to help him?

## 2019-09-07 NOTE — Telephone Encounter (Signed)
I sent email to Alan Brown with [facility] asking if can get pt in before 09/12/19

## 2019-09-08 ENCOUNTER — Telehealth: Payer: Self-pay | Admitting: Physical Therapy

## 2019-09-08 NOTE — Telephone Encounter (Signed)
Returned pt's call. He stated he saw his MD and they they are planning to get imaging for the shoulder. Today he reported feeling good so he was confused as to what he should do. I recommended moving forward with the imaging to rule out chance of a re-tear. We will hold off on formal PT visits to avoid using too many approved visits from the [place] until he has been reassessed, but to continue with his current HEP to maintain his current ROM and strengthening in the meantime.

## 2019-09-12 NOTE — Telephone Encounter (Signed)
Per Alan Brown with [facility]: I will place this patient on a cancellation list but at this time we do not have any other openings to accommodate. Thank you!

## 2019-09-21 ENCOUNTER — Ambulatory Visit: Payer: No Typology Code available for payment source | Admitting: Orthopaedic Surgery

## 2019-09-27 ENCOUNTER — Ambulatory Visit
Admission: RE | Admit: 2019-09-27 | Discharge: 2019-09-27 | Disposition: A | Discharge status: Home / Self Care | Payer: Medicare Other | Source: Ambulatory Visit | Attending: Physician Assistant | Admitting: Physician Assistant

## 2019-09-27 ENCOUNTER — Other Ambulatory Visit: Payer: Self-pay

## 2019-09-27 DIAGNOSIS — M25511 Pain in right shoulder: Secondary | ICD-10-CM

## 2019-09-27 MED ORDER — IOPAMIDOL (ISOVUE-M 200) INJECTION 41%
15.0000 mL | Freq: Once | INTRAMUSCULAR | Status: AC
  Administered 2019-09-27: 15:00:00 15 mL via INTRA_ARTICULAR

## 2019-09-28 ENCOUNTER — Ambulatory Visit: Payer: Medicare Other | Admitting: Orthopaedic Surgery

## 2019-09-28 ENCOUNTER — Encounter (HOSPITAL_BASED_OUTPATIENT_CLINIC_OR_DEPARTMENT_OTHER): Payer: Medicare Other | Attending: Physician Assistant | Admitting: Physician Assistant

## 2019-09-28 DIAGNOSIS — R4681 Obsessive-compulsive behavior: Secondary | ICD-10-CM | POA: Diagnosis not present

## 2019-09-28 DIAGNOSIS — L98492 Non-pressure chronic ulcer of skin of other sites with fat layer exposed: Secondary | ICD-10-CM | POA: Insufficient documentation

## 2019-09-28 DIAGNOSIS — F319 Bipolar disorder, unspecified: Secondary | ICD-10-CM | POA: Insufficient documentation

## 2019-09-28 DIAGNOSIS — Z87891 Personal history of nicotine dependence: Secondary | ICD-10-CM | POA: Diagnosis not present

## 2019-09-28 NOTE — Progress Notes (Signed)
F/u to discuss

## 2019-10-06 ENCOUNTER — Telehealth: Payer: Self-pay | Admitting: Orthopedic Surgery

## 2019-10-06 NOTE — Telephone Encounter (Signed)
Will call to check the status of additional visits

## 2019-10-06 NOTE — Telephone Encounter (Signed)
I just left him a vm to call us back

## 2019-10-06 NOTE — Telephone Encounter (Signed)
Can you please call Alan Brown to discuss his MRI results?  He had a follow up visit scheduled, but it was cancelled because he ran out of referrals on his [place] authorization.  I believe [name] submitted a request for additional visits, but he would like to go ahead and know the results of his study.

## 2019-10-06 NOTE — Telephone Encounter (Signed)
I called patient and discussed MRI.  He has more detailed questions that he would like to discuss with you

## 2019-10-09 NOTE — Telephone Encounter (Signed)
Spoke with him

## 2019-10-10 ENCOUNTER — Encounter (HOSPITAL_BASED_OUTPATIENT_CLINIC_OR_DEPARTMENT_OTHER): Payer: Medicare Other | Admitting: Internal Medicine

## 2019-10-10 NOTE — Telephone Encounter (Signed)
Ok thanks 

## 2019-10-12 NOTE — Telephone Encounter (Signed)
Tried calling [facility] to get status for approving additional visits with [place]. No answer LMOM to return call.

## 2019-10-28 ENCOUNTER — Other Ambulatory Visit (HOSPITAL_BASED_OUTPATIENT_CLINIC_OR_DEPARTMENT_OTHER): Payer: Self-pay | Admitting: Internal Medicine

## 2019-10-28 ENCOUNTER — Encounter (HOSPITAL_BASED_OUTPATIENT_CLINIC_OR_DEPARTMENT_OTHER): Payer: No Typology Code available for payment source | Attending: Internal Medicine | Admitting: Internal Medicine

## 2019-10-28 DIAGNOSIS — F319 Bipolar disorder, unspecified: Secondary | ICD-10-CM | POA: Insufficient documentation

## 2019-10-28 DIAGNOSIS — L98492 Non-pressure chronic ulcer of skin of other sites with fat layer exposed: Secondary | ICD-10-CM | POA: Insufficient documentation

## 2019-10-28 NOTE — Progress Notes (Signed)
Alan, Brown ([id number]) Visit Report for 10/28/2019 Biopsy Details Patient Name: Date of Service: Alan Brown, Alan Brown 10/28/2019 2:45 PM Medical Record [id number] Patient Account Number: [id number] Date of Birth/Sex: December 26, 1950 (68 y.o. M) Treating RN: Cherylin Mylar Primary Care Provider: Elsworth Soho Other Clinician: Referring Provider: Treating Provider/Extender:Robson, Driscilla Grammes, L.DEAN Weeks in Treatment: 4 Biopsy Performed for: Wound #1 Neck Location(s): Wound Bed Performed By: Physician Maxwell Caul., MD Tissue Punch: No Number of Specimens Taken: 2 Specimen Sent To Pathology: Yes Level of Consciousness (Pre-procedure): Awake and Alert Pre-procedure Verification/Time-Out Yes - 15:28 Taken: Pain Control: Lidocaine Injectable Lidocaine Percent: 1% Instrument: Blade, Forceps Bleeding: Minimum Hemostasis Achieved: Silver Nitrate Procedural Pain: 2 Post Procedural Pain: 0 Response to Treatment: Procedure was tolerated well Level of Consciousness (Post-procedure): Awake and Alert Post Procedure Diagnosis Same as Pre-procedure Electronic Signature(s) Signed: 10/28/2019 5:33:43 PM By: Baltazar Najjar MD Entered By: Baltazar Najjar on 10/28/2019 15:38:30 -------------------------------------------------------------------------------- HPI Details Patient Name: Date of Service: Alan Brown 10/28/2019 2:45 PM Medical Record [id number] Patient Account Number: [id number] Date of Birth/Sex: Treating RN: November 24, 1951 (68 y.o. Katherina Right Primary Care Provider: Elsworth Soho Other Clinician: Referring Provider: Treating Provider/Extender:Robson, Driscilla Grammes, L.DEAN Weeks in Treatment: 4 History of Present Illness HPI Description: 09/28/2019 on evaluation today patient presents for initial evaluation here in our clinic concerning issues that he has been having for roughly 5 years. He has a referral from the [place] clinic he is  apparently been seeing orthopedics they were unable to get things healed as far as his wound is concerned. He believes this began as a result of initially having shingles a number of years ago. With that being said he tells me that the biggest issue that he finds right now is that he is constantly is picking and messing with the area. He states that he is really not able to keep a bandage on because whenever he puts the bandage on he will subsequently take it off and just short order and then begin picking at the area yet again. He has been trying to put some antibiotic ointment on it is been using Neosporin with pain relief that is lidocaine and subsequently he has really done the application of this even up to 8-10 times a day. Again he is trying to tell me by telling me this that that is when he gets some relief as far as the pain is concerned. With that being said I think the biggest issue with his discomfort is that the wound is drying out. I think the wound is drying out because he is leaving it open to air all the time for the most part and really is not keeping a dressing on this. He apparently does have a psychiatrist although I think he may need to see psychology as well since there does not appear to be any medication that can help him based on what he tells me the psychiatrist told me. Therefore he may need to have some work or help with behaviors in general in order to be able to keep her dressing in place and allow this area to actually heal. He does have an issue apparently with chronic pain he has had ongoing issues in that regard. He also has bipolar disorder and apparently based on what I am hearing today excessive compulsive behaviors although I am not sure he is actually been diagnosed as obsessive- compulsive disorder. No fevers, chills, nausea, vomiting, or diarrhea. 11/13; patient was admitted to the clinic a  month ago. He has an area on the back of his neck. This has been  there chronically. He shows me an area that is hypopigmented next to it and points out that this is previously healed although he seems to have a large area of hypopigmentation in his neck that goes down to the C-spine level. He says this is secondary to shingles. In any case he has been applying silver collagen to this wound. He complains predominantly of pruritus and that he cannot stop picking at it. He feels he is OCD because he continuously picks at the wound. Electronic Signature(s) Signed: 10/28/2019 5:33:43 PM By: Baltazar Najjarobson, Michael MD Entered By: Baltazar Najjarobson, Michael on 10/28/2019 15:40:09 -------------------------------------------------------------------------------- Physical Exam Details Patient Name: Date of Service: Alan BlitzNDERSON, Alan R. 10/28/2019 2:45 PM Medical Record [id number] Patient Account Number: [id number] Date of Birth/Sex: Treating RN: [birth year] (68 y.o. Katherina RightM) Dwiggins, Shannon Primary Care Provider: Elsworth SohoMITCHELL, L.DEAN Other Clinician: Referring Provider: Treating Provider/Extender:Robson, Driscilla GrammesMichael MITCHELL, L.DEAN Weeks in Treatment: 4 Constitutional Sitting or standing Blood Pressure is within target range for patient.. Pulse regular and within target range for patient.Marland Kitchen. Respirations regular, non-labored and within target range.. Temperature is normal and within the target range for the patient.Marland Kitchen. Appears in no distress. Notes Wound exam; the patient's area is on the mid part of neck. This is just to the left of midline. Small punched out area with rolled senescent edges. Surface is nonviable. At the left side of this wound superiorly the skin above this almost looks like a tinea infection. To the right of the wound is a scarred area that he said was a similar area that healed. As opposed to last time there is not good granulation on this. Nonviable surface. Electronic Signature(s) Signed: 10/28/2019 5:33:43 PM By: Baltazar Najjarobson, Michael MD Entered By: Baltazar Najjarobson, Michael on  10/28/2019 15:41:50 -------------------------------------------------------------------------------- Physician Orders Details Patient Name: Date of Service: Alan BlitzNDERSON, Croix R. 10/28/2019 2:45 PM Medical Record [id number] Patient Account Number: [id number] Date of Birth/Sex: Treating RN: [birth year] (68 y.o. Katherina RightM) Dwiggins, Shannon Primary Care Provider: Elsworth SohoMITCHELL, L.DEAN Other Clinician: Referring Provider: Treating Provider/Extender:Robson, Driscilla GrammesMichael MITCHELL, L.DEAN Weeks in Treatment: 4 Verbal / Phone Orders: No Diagnosis Coding ICD-10 Coding Code Description S11.80XA Unspecified open wound of other specified part of neck, initial encounter L98.492 Non-pressure chronic ulcer of skin of other sites with fat layer exposed R46.81 Obsessive-compulsive behavior F31.9 Bipolar disorder, unspecified Follow-up Appointments Return Appointment in 1 week. Dressing Change Frequency Change Dressing every other day. Skin Barriers/Peri-Wound Care Skin Prep - apply around the wound to aid in keeping dressing in place. Wound Cleansing May shower and wash wound with soap and water. Primary Wound Dressing Wound #1 Neck Silver Collagen - moisten with saline then apply directly to wound bed. Secondary Dressing Wound #1 Neck Foam Border - or bordered gauze. Apply bordered gauze over the moisten silver collagen. Laboratory Bacteria identified in Tissue by Biopsy culture (MICRO) - posterior neck LOINC Code: [id number] Convenience Name: Biopsy specimen culture Electronic Signature(s) Signed: 10/28/2019 5:20:59 PM By: Cherylin Mylarwiggins, Shannon Signed: 10/28/2019 5:33:43 PM By: Baltazar Najjarobson, Michael MD Entered By: Cherylin Mylarwiggins, Shannon on 10/28/2019 15:33:15 -------------------------------------------------------------------------------- Problem List Details Patient Name: Date of Service: Alan BlitzNDERSON, Jamus R. 10/28/2019 2:45 PM Medical Record [id number] Patient Account Number: [id number] Date of  Birth/Sex: Treating RN: [birth year] (68 y.o. Katherina RightM) Dwiggins, Shannon Primary Care Provider: Elsworth SohoMITCHELL, L.DEAN Other Clinician: Referring Provider: Treating Provider/Extender:Robson, Driscilla GrammesMichael MITCHELL, L.DEAN Weeks in Treatment: 4 Active Problems ICD-10 Evaluated Encounter Code Description Active Date Today Diagnosis S11.80XA Unspecified open wound of other specified  part of 09/28/2019 No Yes neck, initial encounter L98.492 Non-pressure chronic ulcer of skin of other sites with 09/28/2019 No Yes fat layer exposed R46.81 Obsessive-compulsive behavior 09/28/2019 No Yes F31.9 Bipolar disorder, unspecified 09/28/2019 No Yes Inactive Problems Resolved Problems Electronic Signature(s) Signed: 10/28/2019 5:33:43 PM By: Baltazar Najjar MD Entered By: Baltazar Najjar on 10/28/2019 15:38:14 -------------------------------------------------------------------------------- Progress Note Details Patient Name: Date of Service: Alan Brown 10/28/2019 2:45 PM Medical Record [id number] Patient Account Number: [id number] Date of Birth/Sex: Treating RN: [birth year] (68 y.o. Katherina Right Primary Care Provider: Other Clinician: Elsworth Soho Referring Provider: Treating Provider/Extender:Robson, Driscilla Grammes, L.DEAN Weeks in Treatment: 4 Subjective History of Present Illness (HPI) 09/28/2019 on evaluation today patient presents for initial evaluation here in our clinic concerning issues that he has been having for roughly 5 years. He has a referral from the [place] clinic he is apparently been seeing orthopedics they were unable to get things healed as far as his wound is concerned. He believes this began as a result of initially having shingles a number of years ago. With that being said he tells me that the biggest issue that he finds right now is that he is constantly is picking and messing with the area. He states that he is really not able to keep a bandage on because  whenever he puts the bandage on he will subsequently take it off and just short order and then begin picking at the area yet again. He has been trying to put some antibiotic ointment on it is been using Neosporin with pain relief that is lidocaine and subsequently he has really done the application of this even up to 8- 10 times a day. Again he is trying to tell me by telling me this that that is when he gets some relief as far as the pain is concerned. With that being said I think the biggest issue with his discomfort is that the wound is drying out. I think the wound is drying out because he is leaving it open to air all the time for the most part and really is not keeping a dressing on this. He apparently does have a psychiatrist although I think he may need to see psychology as well since there does not appear to be any medication that can help him based on what he tells me the psychiatrist told me. Therefore he may need to have some work or help with behaviors in general in order to be able to keep her dressing in place and allow this area to actually heal. He does have an issue apparently with chronic pain he has had ongoing issues in that regard. He also has bipolar disorder and apparently based on what I am hearing today excessive compulsive behaviors although I am not sure he is actually been diagnosed as obsessive- compulsive disorder. No fevers, chills, nausea, vomiting, or diarrhea. 11/13; patient was admitted to the clinic a month ago. He has an area on the back of his neck. This has been there chronically. He shows me an area that is hypopigmented next to it and points out that this is previously healed although he seems to have a large area of hypopigmentation in his neck that goes down to the C-spine level. He says this is secondary to shingles. In any case he has been applying silver collagen to this wound. He complains predominantly of pruritus and that he cannot stop picking at  it. He feels he is OCD because he continuously picks at  the wound. Objective Constitutional Sitting or standing Blood Pressure is within target range for patient.. Pulse regular and within target range for patient.Marland Kitchen Respirations regular, non-labored and within target range.. Temperature is normal and within the target range for the patient.Marland Kitchen Appears in no distress. Vitals Time Taken: 2:50 PM, Height: 70 in, Weight: 210 lbs, BMI: 30.1, Temperature: 98.2 F, Pulse: 81 bpm, Respiratory Rate: 18 breaths/min, Blood Pressure: 130/88 mmHg. General Notes: Wound exam; the patient's area is on the mid part of neck. This is just to the left of midline. Small punched out area with rolled senescent edges. Surface is nonviable. At the left side of this wound superiorly the skin above this almost looks like a tinea infection. To the right of the wound is a scarred area that he said was a similar area that healed. As opposed to last time there is not good granulation on this. Nonviable surface. Integumentary (Hair, Skin) Wound #1 status is Open. Original cause of wound was Bump. The wound is located on the Neck. The wound measures 1.7cm length x 2.5cm width x 0.1cm depth; 3.338cm^2 area and 0.334cm^3 volume. There is Fat Layer (Subcutaneous Tissue) Exposed exposed. There is no tunneling or undermining noted. There is a medium amount of serous drainage noted. The wound margin is distinct with the outline attached to the wound base. There is medium (34-66%) pink, pale granulation within the wound bed. There is a medium (34-66%) amount of necrotic tissue within the wound bed including Adherent Slough. Assessment Active Problems ICD-10 Unspecified open wound of other specified part of neck, initial encounter Non-pressure chronic ulcer of skin of other sites with fat layer exposed Obsessive-compulsive behavior Bipolar disorder, unspecified Procedures Wound #1 Pre-procedure diagnosis of Wound #1 is an  Atypical located on the Neck . There was a biopsy performed by Ricard Dillon., MD. There was a biopsy performed on Wound Bed. The skin was cleansed and prepped with anti-septic followed by pain control using Lidocaine Injectable: 1%. Tissue was removed at its base with the following instrument(s): Blade and Forceps and sent to pathology. A Minimum amount of bleeding was controlled with Silver Nitrate. A time out was conducted at 15:28, prior to the start of the procedure. The procedure was tolerated well with a pain level of 2 throughout and a pain level of 0 following the procedure. Post procedure Diagnosis Wound #1: Same as Pre-Procedure Plan Follow-up Appointments: Return Appointment in 1 week. Dressing Change Frequency: Change Dressing every other day. Skin Barriers/Peri-Wound Care: Skin Prep - apply around the wound to aid in keeping dressing in place. Wound Cleansing: May shower and wash wound with soap and water. Primary Wound Dressing: Wound #1 Neck: Silver Collagen - moisten with saline then apply directly to wound bed. Secondary Dressing: Wound #1 Neck: Foam Border - or bordered gauze. Apply bordered gauze over the moisten silver collagen. Laboratory ordered were: Biopsy specimen - posterior neck 1. I chose the far right of this oval-shaped wound clean the area with Betadine. Anesthetized with injectable lidocaine 1% and obtain 2 biopsy specimens for pathology. I wonder whether this is a malignant area or possibly fungal. 2. He has a very odd hypopigmented vertical strip from the level of the wound down to his upper C-spine. He says this is secondary to healed shingles. That is definitely not the case. Aside from this I do not see any other wound issues Electronic Signature(s) Signed: 10/28/2019 5:33:43 PM By: Linton Ham MD Entered By: Linton Ham on 10/28/2019 15:43:54 --------------------------------------------------------------------------------  SuperBill  Details Patient Name: Date of Service: JERRICK, FARVE 10/28/2019 Medical Record [id number] Patient Account Number: [id number] Date of Birth/Sex: Treating RN: [birth year] (68 y.o. Katherina Right Primary Care Provider: Elsworth Soho Other Clinician: Referring Provider: Treating Provider/Extender:Robson, Driscilla Grammes, L.DEAN Weeks in Treatment: 4 Diagnosis Coding ICD-10 Codes Code Description S11.80XA Unspecified open wound of other specified part of neck, initial encounter L98.492 Non-pressure chronic ulcer of skin of other sites with fat layer exposed R46.81 Obsessive-compulsive behavior F31.9 Bipolar disorder, unspecified Facility Procedures The patient participates with Medicare or their insurance follows the Medicare Facility Guidelines: CPT4 Description Modifier Quantity Code [id number] 11102-Tangential biopsy of skin (e.g., shave, scoop, saucerize, curette) single 1 lesion ICD-10 Diagnosis  Description L98.492 Non-pressure chronic ulcer of skin of other sites with fat layer exposed Physician Procedures CPT4 Code Description: 11102 Tangential biopsy of skin (e.g., shave, scoop, saucerize, curette) single lesion ICD-10 Diagnosis Description L98.492 Non-pressure chronic ulcer of skin of other sites with fat la Modifier: yer exposed Quantity: 1 Electronic Signature(s) Signed: 10/28/2019 5:33:43 PM By: Baltazar Najjar MD Entered By: Baltazar Najjar on 10/28/2019 15:44:08

## 2019-11-04 ENCOUNTER — Encounter (HOSPITAL_BASED_OUTPATIENT_CLINIC_OR_DEPARTMENT_OTHER): Payer: No Typology Code available for payment source | Admitting: Internal Medicine

## 2019-11-04 ENCOUNTER — Other Ambulatory Visit: Payer: Self-pay

## 2019-11-04 DIAGNOSIS — L98492 Non-pressure chronic ulcer of skin of other sites with fat layer exposed: Secondary | ICD-10-CM | POA: Diagnosis not present

## 2019-11-04 NOTE — Progress Notes (Signed)
LOIS, OSTROM ([id number]) Visit Report for 11/04/2019 Fall Risk Assessment Details Patient Name: Date of Service: Alan Brown, Alan Brown 11/04/2019 3:15 PM Medical Record [id number] Patient Account Number: [id number] Date of Birth/Sex: Treating RN: November 01, 1951 (68 y.o. Hessie Diener Primary Care Tevan Marian: Donavan Burnet Other Clinician: Referring Otoniel Myhand: Treating Findley Vi/Extender:Robson, Diana Eves, L.DEAN Weeks in Treatment: 5 Fall Risk Assessment Items Have you had 2 or more falls in the last 12 monthso 0 Yes Have you had any fall that resulted in injury in the last 12 monthso 0 No FALLS RISK SCREEN History of falling - immediate or within 3 months 25 Yes Secondary diagnosis (Do you have 2 or more medical diagnoseso) 0 No Ambulatory aid None/bed rest/wheelchair/nurse 0 No Crutches/cane/walker 15 Yes Furniture 0 No Intravenous therapy Access/Saline/Heparin Lock 0 No Weak (short steps with or without shuffle, stooped but able to lift head 10 Yes while walking, may seek support from furniture) Impaired (short steps with shuffle, may have difficulty arising from chair, 0 No head down, impaired balance) Mental Status Oriented to own ability 0 Yes Overestimates or forgets limitations 0 No Risk Level: Medium Risk Score: 50 Electronic Signature(s) Signed: 11/04/2019 6:01:02 PM By: Deon Pilling Entered By: Deon Pilling on 11/04/2019 16:02:18

## 2019-11-07 NOTE — Progress Notes (Signed)
Monico BlitzNDERSON, Bocephus R. ([id number]) Visit Report for 11/04/2019 HPI Details Patient Name: Date of Service: Monico BlitzNDERSON, Armonie R. 11/04/2019 3:15 PM Medical Record [id number] Patient Account Number: [id number] Date of Birth/Sex: Treating RN: 22-Aug-1951 (68 y.o. Katherina RightM) Dwiggins, Shannon Primary Care Provider: Elsworth SohoMITCHELL, L.DEAN Other Clinician: Referring Provider: Treating Provider/Extender:, Driscilla GrammesMichael MITCHELL, L.DEAN Weeks in Treatment: 5 History of Present Illness HPI Description: 09/28/2019 on evaluation today patient presents for initial evaluation here in our clinic concerning issues that he has been having for roughly 5 years. He has a referral from the [facility] he is apparently been seeing orthopedics they were unable to get things healed as far as his wound is concerned. He believes this began as a result of initially having shingles a number of years ago. With that being said he tells me that the biggest issue that he finds right now is that he is constantly is picking and messing with the area. He states that he is really not able to keep a bandage on because whenever he puts the bandage on he will subsequently take it off and just short order and then begin picking at the area yet again. He has been trying to put some antibiotic ointment on it is been using Neosporin with pain relief that is lidocaine and subsequently he has really done the application of this even up to 8-10 times a day. Again he is trying to tell me by telling me this that that is when he gets some relief as far as the pain is concerned. With that being said I think the biggest issue with his discomfort is that the wound is drying out. I think the wound is drying out because he is leaving it open to air all the time for the most part and really is not keeping a dressing on this. He apparently does have a psychiatrist although I think he may need to see psychology as well since there does not appear to be any  medication that can help him based on what he tells me the psychiatrist told me. Therefore he may need to have some work or help with behaviors in general in order to be able to keep her dressing in place and allow this area to actually heal. He does have an issue apparently with chronic pain he has had ongoing issues in that regard. He also has bipolar disorder and apparently based on what I am hearing today excessive compulsive behaviors although I am not sure he is actually been diagnosed as obsessive- compulsive disorder. No fevers, chills, nausea, vomiting, or diarrhea. 11/13; patient was admitted to the clinic a month ago. He has an area on the back of his neck. This has been there chronically. He shows me an area that is hypopigmented next to it and points out that this is previously healed although he seems to have a large area of hypopigmentation in his neck that goes down to the C-spine level. He says this is secondary to shingles. In any case he has been applying silver collagen to this wound. He complains predominantly of pruritus and that he cannot stop picking at it. He feels he is OCD because he continuously picks at the wound. 11/20; this is a patient with a very atypical wound on the back of his neck. I thought with some confidence that the patient probably had a skin cancer. I did a shave biopsy of this last week that only showed slight squamous atypia. 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  I have never seen a wart look like this. The patient states this is painful indeed even beyond the open area there seems to be tissue involvement. I am still very suspicious that this is something beyond a viral wart Electronic Signature(s) Signed: 11/04/2019 6:05:38 PM By: Baltazar Najjar MD Entered By: Baltazar Najjar on 11/04/2019 17:47:00 -------------------------------------------------------------------------------- Physical Exam Details Patient Name: Date of Service: ATUL, DELUCIA 11/04/2019 3:15 PM Medical Record [id number] Patient Account Number: [id number] Date of Birth/Sex: Treating RN: 08-24-1951 (68 y.o. Katherina Right Primary Care Provider: Elsworth Soho Other Clinician: Referring Provider: Treating Provider/Extender:, Driscilla Grammes, L.DEAN Weeks in Treatment: 5 Constitutional Patient is hypertensive.. Pulse regular and within target range for patient.Marland Kitchen Respirations regular, non-labored and within target range.Marland Kitchen Appears in no distress. Notes Wound exam; the patient's areas on the mid part of his neck. This is just the left of midline. Fairly large area with rolled senescent edges. Surface is nonviable. There is subcutaneous involvement beyond the wound edge which is tender. Electronic Signature(s) Signed: 11/04/2019 6:05:38 PM By: Baltazar Najjar MD Entered By: Baltazar Najjar on 11/04/2019 17:47:55 -------------------------------------------------------------------------------- Physician Orders Details Patient Name: Date of Service: EPHRAM, KORNEGAY 11/04/2019 3:15 PM Medical Record [id number] Patient Account Number: [id number] Date of Birth/Sex: Treating RN: 01-Oct-1951 (68 y.o. Katherina Right Primary Care Provider: Elsworth Soho Other Clinician: Referring Provider: Treating Provider/Extender:, Driscilla Grammes, L.DEAN Weeks in Treatment: 5 Verbal / Phone Orders: No Diagnosis Coding [facility]charge From [facility] - Needs to Follow up with primary MD Anson Fret, MD) to get appointment with Dermatology Dressing Change Frequency Change dressing every day. Wound Cleansing May shower and wash wound with soap and water. Primary Wound Dressing Wound #1 Neck Other: - antibiotics ointment (neosporin) Secondary Dressing Wound #1 Neck Other: - cover with bandaid Consults Dermatology - Biopsy showing Verruca Vulgaris to neck Electronic Signature(s) Signed:  11/04/2019 6:05:38 PM By: Baltazar Najjar MD Signed: 11/07/2019 2:17:56 PM By: Cherylin Mylar Entered By: Cherylin Mylar on 11/04/2019 17:05:28 -------------------------------------------------------------------------------- Prescription 11/04/2019 Patient Name: [name]. Provider: Baltazar Najjar MD Date of Birth: 1951-10-27 NPI#: [id number] Sex: Judie Petit DEA#: [id number] Phone #: [phone] License #: [id number] Patient Address: Eligha Bridegroom [facility] [address] [place], [place] [address] [address] [place], [place] [address] [phone] Allergies morphine Reaction: seeing things Severity: Mild Provider's Orders Dermatology - Biopsy showing Verruca Vulgaris to neck Signature(s): Date(s): Electronic Signature(s) Signed: 11/04/2019 6:05:38 PM By: Baltazar Najjar MD Signed: 11/07/2019 2:17:56 PM By: Cherylin Mylar Entered By: Cherylin Mylar on 11/04/2019 17:05:29 --------------------------------------------------------------------------------  Progress Note Details Patient Name: Date of Service: DUANNE, DUCHESNE 11/04/2019 3:15 PM Medical Record [id number] Patient Account Number: [id number] Date of Birth/Sex: Treating RN: 1951/09/23 (68 y.o. Katherina Right Primary Care Provider: Elsworth Soho Other Clinician: Referring Provider: Treating Provider/Extender:, Driscilla Grammes, L.DEAN Weeks in Treatment: 5 Subjective History of Present Illness (HPI) 09/28/2019 on evaluation today patient presents for initial evaluation here in our clinic concerning issues that he has been having for roughly 5 years. He has a referral from the [place] [facility] he is apparently been seeing orthopedics they were unable to get things healed as far as his wound is concerned. He believes this began as a result of initially having shingles a number of years ago. With that being said he tells me that the biggest issue  that he finds right now is that he is constantly is picking and messing with the area.  He states that he is really not able to keep a bandage on because whenever he puts the bandage on he will subsequently take it off and just short order and then begin picking at the area yet again. He has been trying to put some antibiotic ointment on it is been using Neosporin with pain relief that is lidocaine and subsequently he has really done the application of this even up to 8- 10 times a day. Again he is trying to tell me by telling me this that that is when he gets some relief as far as the pain is concerned. With that being said I think the biggest issue with his discomfort is that the wound is drying out. I think the wound is drying out because he is leaving it open to air all the time for the most part and really is not keeping a dressing on this. He apparently does have a psychiatrist although I think he may need to see psychology as well since there does not appear to be any medication that can help him based on what he tells me the psychiatrist told me. Therefore he may need to have some work or help with behaviors in general in order to be able to keep her dressing in place and allow this area to actually heal. He does have an issue apparently with chronic pain he has had ongoing issues in that regard. He also has bipolar disorder and apparently based on what I am hearing today excessive compulsive behaviors although I am not sure he is actually been diagnosed as obsessive- compulsive disorder. No fevers, chills, nausea, vomiting, or diarrhea. 11/13; patient was admitted to the clinic a month ago. He has an area on the back of his neck. This has been there chronically. He shows me an area that is hypopigmented next to it and points out that this is previously healed although he seems to have a large area of hypopigmentation in his neck that goes down to the C-spine level. He says this is secondary  to shingles. In any case he has been applying silver collagen to this wound. He complains predominantly of pruritus and that he cannot stop picking at it. He feels he is OCD because he continuously picks at the wound. 11/20; this is a patient with a very atypical wound on the back of his neck. I thought with some confidence that the patient probably had a skin cancer. I did a shave biopsy of this last week that only showed slight squamous atypia. 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 I have never seen a wart look like this. The patient states this is painful indeed even beyond the open area there seems to be tissue involvement. I am still very suspicious that this is something beyond a viral wart Objective Constitutional Patient is hypertensive.. Pulse regular and within target range for patient.Marland Kitchen Respirations regular, non-labored and within target range.Marland Kitchen Appears in no distress. Vitals Time Taken: 4:00 PM, Height: 70 in, Weight: 210 lbs, BMI: 30.1, Temperature: 97.8 F, Pulse: 66 bpm, Respiratory Rate: 20 breaths/min, Blood Pressure: 118/103 mmHg. General Notes: Wound exam; the patient's areas on the mid part of his neck. This is just the left of midline. Fairly large area with rolled senescent edges. Surface is nonviable. There is subcutaneous involvement beyond the wound edge which is tender. Integumentary (Hair, Skin) Wound #1 status is Open. Original cause of wound was Bump. The wound is located on the Neck. The wound measures 3cm  length x 5.8cm width x 0.2cm depth; 13.666cm^2 area and 2.733cm^3 volume. There is Fat Layer (Subcutaneous Tissue) Exposed exposed. There is no tunneling or undermining noted. There is a medium amount of purulent drainage noted. The wound margin is distinct with the outline attached to the wound base. There is small (1-33%) pink, pale granulation within the wound bed. There is a large (67-100%) amount of necrotic tissue within the wound bed including  Eschar and Adherent Slough. General Notes: periwound redden, edema, and patient c/o of tenderness when touch. [facility]: Discharge from [place] - Needs to Follow up with primary MD Landry Mellow, MD) to get appointment with Dermatology Dressing Change Frequency: Change dressing every day. Wound Cleansing: May shower and wash wound with soap and water. Primary Wound Dressing: Wound #1 Neck: Other: - antibiotics ointment (neosporin) Secondary Dressing: Wound #1 Neck: Other: - cover with bandaid Consults ordered were: Dermatology - Biopsy showing Verruca Vulgaris to neck #1 I do not think this is verruca vulgaris I have never seen anything viral look like this 2. I think the patient probably needs a dermatologic review of this. I tried to expedient things last week by biopsying this but my confidence in the biopsy is just not there 3. I am still very concerned that this represents a malignancy. 4. I am going to try to arrange for a dermatology appointment through the [place]. My understanding is this will have to go through his primary doctor in [place] Dr. Landry Mellow Electronic Signature(s) Signed: 11/04/2019 6:05:38 PM By: Linton Ham MD Entered By: Linton Ham on 11/04/2019 17:49:47 -------------------------------------------------------------------------------- SuperBill Details Patient Name: Date of Service: GEORGIOS, KINA 11/04/2019 Medical Record [id number] Patient Account Number: [id number] Date of Birth/Sex: Treating RN: [birth year] (68 y.o. [name] Primary Care Provider: Donavan Burnet Other Clinician: Referring Provider: Treating Provider/Extender:, Diana Eves, L.DEAN Weeks in Treatment: 5 Diagnosis Coding ICD-10 Codes Code Description S11.80XA Unspecified open wound of other specified part of neck, initial encounter L98.492 Non-pressure chronic ulcer of skin of other sites with  fat layer exposed R46.81 Obsessive-compulsive behavior F31.9 Bipolar disorder, unspecified Facility Procedures The patient participates with Medicare or their insurance follows the Medicare Facility Guidelines: CPT4 Code Description Modifier Quantity [id number] [place] VISIT-LEV 3 EST PT 1 Physician Procedures CPT4 Code Description: [id number] - WC PHYS LEVEL 2 - EST PT ICD-10 Diagnosis Description S11.80XA Unspecified open wound of other specified part of neck, i L98.492 Non-pressure chronic ulcer of skin of other sites with fa Modifier: nitial encoun t layer expos Quantity: 1 ter ed Electronic Signature(s) Signed: 11/04/2019 6:05:38 PM By: Linton Ham MD Entered By: Linton Ham on 11/04/2019 17:50:03

## 2019-11-07 NOTE — Progress Notes (Addendum)
Alan Brown, Alan R. ([id number]) Visit Report for 11/04/2019 Arrival Information Details Patient Name: Date of Service: Alan Brown, Alan R. 11/04/2019 3:15 PM Medical Record [id number] Patient Account Number: [id number] Date of Birth/Sex: Treating RN: 12/25/1950 (68 y.o. Alan Brown) Deaton, Millard.LoaBobbi Primary Care Sholonda Jobst: Elsworth SohoMITCHELL, L.DEAN Other Clinician: Referring Jenaveve Fenstermaker: Treating Bryahna Lesko/Extender:Robson, Driscilla GrammesMichael MITCHELL, L.DEAN Weeks in Treatment: 5 Visit Information History Since Last Visit Cane Added or deleted any medications: No Patient Arrived: 16:00 Any new allergies or adverse reactions: No Arrival Time: Had a fall or experienced change in Yes Accompanied By: self None activities of daily living that may affect Transfer Assistance: risk of falls: Patient Identification Verified: Yes Signs or symptoms of abuse/neglect since last No Secondary Verification Process Completed: Yes visito Patient Requires Transmission-Based No Hospitalized since last visit: No Precautions: Implantable device outside of the clinic excluding No Patient Has Alerts: No cellular tissue based products placed in the center since last visit: Has Dressing in Place as Prescribed: No Pain Present Now: Yes Electronic Signature(s) Signed: 11/04/2019 6:01:02 PM By: Shawn Stalleaton, Bobbi Entered By: Shawn Stalleaton, Bobbi on 11/04/2019 16:01:58 -------------------------------------------------------------------------------- Clinic Level of Care Assessment Details Patient Name: Date of Service: Alan Brown, Alan R. 11/04/2019 3:15 PM Medical Record [id number] Patient Account Number: [id number] Date of Birth/Sex: Treating RN: 12/25/1950 (68 y.o. Katherina RightM) Dwiggins, Shannon Primary Care Aleph Nickson: Elsworth SohoMITCHELL, L.DEAN Other Clinician: Referring Tameah Mihalko: Treating Zarek Relph/Extender:Robson, Driscilla GrammesMichael MITCHELL, L.DEAN Weeks in Treatment: 5 Clinic Level of Care Assessment Items TOOL 4 Quantity Score X - Use when only an EandM is  performed on FOLLOW-UP visit 1 0 ASSESSMENTS - Nursing Assessment / Reassessment X - Reassessment of Co-morbidities (includes updates in patient status) 1 10 X - Reassessment of Adherence to Treatment Plan 1 5 ASSESSMENTS - Wound and Skin Assessment / Reassessment X - Simple Wound Assessment / Reassessment - one wound 1 5 []  - Complex Wound Assessment / Reassessment - multiple wounds 0 []  - Dermatologic / Skin Assessment (not related to wound area) 0 ASSESSMENTS - Focused Assessment []  - Circumferential Edema Measurements - multi extremities 0 []  - Nutritional Assessment / Counseling / Intervention 0 []  - Lower Extremity Assessment (monofilament, tuning fork, pulses) 0 []  - Peripheral Arterial Disease Assessment (using hand held doppler) 0 ASSESSMENTS - Ostomy and/or Continence Assessment and Care []  - Incontinence Assessment and Management 0 []  - Ostomy Care Assessment and Management (repouching, etc.) 0 PROCESS - Coordination of Care X - Simple Patient / Family Education for ongoing care 1 15 []  - Complex (extensive) Patient / Family Education for ongoing care 0 X - Staff obtains ChiropractorConsents, Records, Test Results / Process Orders 1 10 []  - Staff telephones HHA, Nursing Homes / Clarify orders / etc 0 []  - Routine Transfer to another Facility (non-emergent condition) 0 []  - Routine Hospital Admission (non-emergent condition) 0 []  - New Admissions / Manufacturing engineernsurance Authorizations / Ordering NPWT, Apligraf, etc. 0 []  - Emergency Hospital Admission (emergent condition) 0 X - Simple Discharge Coordination 1 10 []  - Complex (extensive) Discharge Coordination 0 PROCESS - Special Needs []  - Pediatric / Minor Patient Management 0 []  - Isolation Patient Management 0 []  - Hearing / Language / Visual special needs 0 []  - Assessment of Community assistance (transportation, D/C planning, etc.) 0 []  - Additional assistance / Altered mentation 0 []  - Support Surface(s) Assessment (bed, cushion, seat, etc.)  0 INTERVENTIONS - Wound Cleansing / Measurement X - Simple Wound Cleansing - one wound 1 5 []  - Complex Wound Cleansing - multiple wounds 0 X - Wound Imaging (photographs -  any number of wounds) 1 5 []  - Wound Tracing (instead of photographs) 0 X - Simple Wound Measurement - one wound 1 5 []  - Complex Wound Measurement - multiple wounds 0 INTERVENTIONS - Wound Dressings X - Small Wound Dressing one or multiple wounds 1 10 []  - Medium Wound Dressing one or multiple wounds 0 []  - Large Wound Dressing one or multiple wounds 0 []  - Application of Medications - topical 0 []  - Application of Medications - injection 0 INTERVENTIONS - Miscellaneous []  - External ear exam 0 []  - Specimen Collection (cultures, biopsies, blood, body fluids, etc.) 0 []  - Specimen(s) / Culture(s) sent or taken to Lab for analysis 0 []  - Patient Transfer (multiple staff / / Similar devices) 0 []  - Simple Staple / Suture removal (25 or less) 0 []  - Complex Staple / Suture removal (26 or more) 0 []  - Hypo / Hyperglycemic Management (close monitor of Blood Glucose) 0 []  - Ankle / Brachial Index (ABI) - do not check if billed separately 0 X - Vital Signs 1 5 Has the patient been seen at the hospital within the last three years: Yes Total Score: 85 Level Of Care: New/Established - Level 3 Electronic Signature(s) Signed: 11/07/2019 2:17:56 PM By: Entered By: on 11/04/2019 17:07:05 -------------------------------------------------------------------------------- Encounter Discharge Information Details Patient Name: Date of Service: . 11/04/2019 3:15 PM Medical Record Patient Account Number: Date of Birth/Sex: Treating RN: 10-Nov-1951 (68 y.o. Primary Care Mckenna Gamm: Other Clinician: Referring Havyn Ramo: Treating Jadie Allington/Extender:Robson, , L.DEAN Weeks in Treatment:  5 Encounter Discharge Information Items Discharge Condition: Stable Ambulatory Status: Cane Discharge Destination: Home Transportation: Private Auto Accompanied By: self Schedule Follow-up Appointment: No Clinical Summary of Care: Electronic Signature(s) Signed: 11/04/2019 6:01:02 PM By: 11/09/2019 Entered By: Cherylin Mylar on 11/04/2019 17:09:03 -------------------------------------------------------------------------------- Lower Extremity Assessment Details Patient Name: Date of Service: Alan Brown, Alan Brown 11/04/2019 3:15 PM Medical Record 11/06/2019 Patient Account Number: [id number] Date of Birth/Sex: Treating RN: 10-Oct-1951 (69 y.o. 78 Primary Care Keona Sheffler: [name] Other Clinician: Referring Jhamari Markowicz: Treating Anginette Espejo/Extender:Robson, Elsworth Soho, L.DEAN Weeks in Treatment: 5 Electronic Signature(s) Signed: 11/04/2019 6:01:02 PM By: 11/06/2019 Entered By: Shawn Stall on 11/04/2019 16:02:52 -------------------------------------------------------------------------------- Multi-Disciplinary Care Plan Details Patient Name: Date of Service: Alan Brown, Alan Brown 11/04/2019 3:15 PM Medical Record 11/06/2019 Patient Account Number: [id number] Date of Birth/Sex: Treating RN: [birth year] (68 y.o. 78 Primary Care Laterrance Nauta: [name] Other Clinician: Referring Lanasia Porras: Treating Kristion Holifield/Extender:Robson, Elsworth Soho, L.DEAN Weeks in Treatment: 5 Active Inactive Electronic Signature(s) Signed: 11/07/2019 2:17:56 PM By: 11/06/2019 Entered By: Shawn Stall on 11/04/2019 17:06:20 -------------------------------------------------------------------------------- Pain Assessment Details Patient Name: Date of Service: Alan Brown, Alan Brown 11/04/2019 3:15 PM Medical Record 11/06/2019 Patient Account Number: [id number] Date of Birth/Sex: Treating RN: Aug 02, 1951 (68 y.o. 78 Primary  Care Ayshia Gramlich: Katherina Right Other Clinician: Referring Amaranta Mehl: Treating Nola Botkins/Extender:Robson, Elsworth Soho, L.DEAN Weeks in Treatment: 5 Active Problems Location of Pain Severity and Description of Pain Patient Has Paino Yes Site Locations Pain Location: Generalized Pain, Pain in Ulcers Rate the pain. Current Pain Level: 9 Worst Pain Level: 10 Least Pain Level: 0 Tolerable Pain Level: 9 Character of Pain Describe the Pain: Heavy, Sharp, Shooting Pain Management and Medication Current Pain Management: Medication: No Cold Application: No Rest: No Massage: No Activity: No T.E.N.S.: No Heat Application: No Leg drop or elevation: No Is the Current Pain Management Adequate: Adequate How does your  wound impact your activities of daily livingo Sleep: No Bathing: No Appetite: No Relationship With Others: No Bladder Continence: No Emotions: No Bowel Continence: No Work: No Toileting: No Drive: No Dressing: No Hobbies: No Electronic Signature(s) Signed: 11/04/2019 6:01:02 PM By: Deon Pilling Entered By: Deon Pilling on 11/04/2019 16:02:48 -------------------------------------------------------------------------------- Patient/Caregiver Education Details Patient Name: Alan Brown 11/20/2020andnbsp3:15 Date of Service: PM Medical Record [id number] Number: Patient Account Number: [id number] Treating RN: 1951-06-17 (68 y.o. Kela Millin Date of Birth/Gender: M) Other Clinician: Primary Care Physician: Donavan Burnet Treating Linton Ham Referring Physician: Physician/Extender: Santiago Bumpers in Treatment: 5 Education Assessment Education Provided To: Patient Education Topics Provided Wound/Skin Impairment: Methods: Explain/Verbal Responses: State content correctly Electronic Signature(s) Signed: 11/07/2019 2:17:56 PM By: Kela Millin Entered By: Kela Millin on 11/04/2019  17:32:30 -------------------------------------------------------------------------------- Wound Assessment Details Patient Name: Date of Service: Alan Brown, Alan Brown 11/04/2019 3:15 PM Medical Record [id number] Patient Account Number: [id number] Date of Birth/Sex: Treating RN: 1951/09/25 (68 y.o. Hessie Diener Primary Care Nelline Lio: Donavan Burnet Other Clinician: Referring Latesia Norrington: Treating Roiza Wiedel/Extender:Robson, Diana Eves, L.DEAN Weeks in Treatment: 5 Wound Status Wound Number: 1 Primary Etiology: Atypical Wound Location: Neck Wound Status: Open Wounding Event: Bump Comorbid History: Rheumatoid Arthritis, Neuropathy Date Acquired: 06/14/2014 Weeks Of Treatment: 5 Clustered Wound: No Photos Wound Measurements Length: (cm) 3 % Reduct Width: (cm) 5.8 % Reduct Depth: (cm) 0.2 Epitheli Area: (cm) 13.666 Tunneli Volume: (cm) 2.733 Undermi Wound Description Classification: Full Thickness Without Exposed Support Foul Odo Structures Slough/F Wound Distinct, outline attached Margin: Exudate Medium Amount: Exudate Purulent Type: Exudate yellow, brown, green Color: Wound Bed Granulation Amount: Small (1-33%) Granulation Quality: Pink, Pale Fascia E Necrotic Amount: Large (67-100%) Fat Laye Necrotic Quality: Eschar, Adherent Slough Tendon E Muscle E Joint Ex Bone Exp r After Cleansing: No ibrino Yes Exposed Structure xposed: No r (Subcutaneous Tissue) Exposed: Yes xposed: No xposed: No posed: No osed: No ion in Area: -437% ion in Volume: -258.2% alization: None ng: No ning: No Assessment Notes periwound redden, edema, and patient c/o of tenderness when touch. Treatment Notes Wound #1 (Neck) 1. Cleanse With Wound Cleanser 3. Primary Dressing Applied Other primary dressing (specifiy in notes) 4. Secondary Dressing Foam Border Dressing 5. Secured With Self Adhesive Bandage Notes primary dressing triple abx ointment. Electronic  Signature(s) Signed: 11/08/2019 7:37:04 AM By: Mikeal Hawthorne EMT/HBOT Signed: 11/08/2019 2:36:36 PM By: Deon Pilling Previous Signature: 11/04/2019 6:01:02 PM Version By: Deon Pilling Entered By: Mikeal Hawthorne on 11/08/2019 07:26:18 -------------------------------------------------------------------------------- Vitals Details Patient Name: Date of Service: Alan Brown, Alan Brown 11/04/2019 3:15 PM Medical Record [id number] Patient Account Number: [id number] Date of Birth/Sex: Treating RN: [birth year] (69 y.o. Hessie Diener Primary Care Arnulfo Batson: Donavan Burnet Other Clinician: Referring Cecelia Graciano: Treating Ayah Cozzolino/Extender:Robson, Diana Eves, L.DEAN Weeks in Treatment: 5 Vital Signs Time Taken: 16:00 Temperature (F): 97.8 Height (in): 70 Pulse (bpm): 66 Weight (lbs): 210 Respiratory Rate (breaths/min): 20 Body Mass Index (BMI): 30.1 Blood Pressure (mmHg): 118/103 Reference Range: 80 - 120 mg / dl Electronic Signature(s) Signed: 11/04/2019 6:01:02 PM By: Deon Pilling Entered By: Deon Pilling on 11/04/2019 16:01:50

## 2019-11-22 NOTE — Progress Notes (Signed)
Alan Brown, Alan R. ([id number]) Visit Report for 09/28/2019 Allergy List Details Patient Name: Date of Service: Alan Brown, Alan R. 09/28/2019 10:30 AM Medical Record [id number] Patient Account Number: [id number] Date of Birth/Sex: Treating Brown: October 16, 1951 (68 y.o. Alan Brown) Alan Brown, Alan Primary Care Conny Situ: Alan Brown, Brown Other Clinician: Referring Alan Brown: Treating Alan Brown/Extender:Stone III, Alan Brown Weeks in Treatment: 0 Allergies Active Allergies morphine Reaction: seeing things Severity: Mild Allergy Notes Electronic Signature(s) Signed: 11/22/2019 3:01:15 PM By: Alan Brown Entered By: Alan Brown, Alan on 09/28/2019 10:50:00 -------------------------------------------------------------------------------- Arrival Information Details Patient Name: Date of Service: Alan Brown, Alan R. 09/28/2019 10:30 AM Medical Record [id number] Patient Account Number: [id number] Date of Birth/Sex: Treating Brown: October 16, 1951 (68 y.o. Alan Brown) Alan Brown, Alan Primary Care Norval Slaven: Alan Brown, Brown Other Clinician: Referring Norleen Xie: Treating Maclovia Uher/Extender:Stone III, Alan Brown Weeks in Treatment: 0 Visit Information Patient Arrived: Cane Arrival Time: 10:47 Accompanied By: self Transfer Assistance: None Patient Identification Verified: Yes Secondary Verification Process Completed: Yes Patient Requires Transmission-Based No Precautions: Patient Has Alerts: No Electronic Signature(s) Signed: 11/22/2019 3:01:15 PM By: Alan Brown Entered By: Alan Brown, Alan on 09/28/2019 10:47:50 -------------------------------------------------------------------------------- Clinic Level of Care Assessment Details Patient Name: Date of Service: Alan Brown, Alan R. 09/28/2019 10:30 AM Medical Record [id number] Patient Account Number: [id number] Date of Birth/Sex: Treating Brown: October 16, 1951 (68 y.o. [name]) Deaton, Bobbi Primary Care Orlin Kann: Alan Brown, Brown Other  Clinician: Referring  Cohick: Treating Jerrye Seebeck/Extender:Stone III, Alan Brown Weeks in Treatment: 0 Clinic Level of Care Assessment Items TOOL 2 Quantity Score X - Use when only an EandM is performed on the INITIAL visit 1 0 ASSESSMENTS - Nursing Assessment / Reassessment X - General Physical Exam (combine w/ comprehensive assessment (listed just below) 1 20 when performed on new pt. evals) X - Comprehensive Assessment (HX, ROS, Risk Assessments, Wounds Hx, etc.) 1 25 ASSESSMENTS - Wound and Skin Assessment / Reassessment X - Simple Wound Assessment / Reassessment - one wound 1 5 []  - Complex Wound Assessment / Reassessment - multiple wounds 0 X - Dermatologic / Skin Assessment (not related to wound area) 1 10 ASSESSMENTS - Ostomy and/or Continence Assessment and Care []  - Incontinence Assessment and Management 0 []  - Ostomy Care Assessment and Management (repouching, etc.) 0 PROCESS - Coordination of Care X - Simple Patient / Family Education for ongoing care 1 15 []  - Complex (extensive) Patient / Family Education for ongoing care 0 X - Staff obtains Consents, Records, Test Results / Process Orders 1 10 []  - Staff telephones HHA, Nursing Homes / Clarify orders / etc 0 []  - Routine Transfer to another Facility (non-emergent condition) 0 []  - Routine Hospital Admission (non-emergent condition) 0 []  - New Admissions / [employer] Authorizations / Ordering NPWT, Apligraf, etc. 0 []  - Emergency Hospital Admission (emergent condition) 0 X - Simple Discharge Coordination 1 10 []  - Complex (extensive) Discharge Coordination 0 PROCESS - Special Needs []  - Pediatric / Minor Patient Management 0 []  - Isolation Patient Management 0 []  - Hearing / Language / Visual special needs 0 []  - Assessment of Community assistance (transportation, D/C planning, etc.) 0 []  - Additional assistance / Altered mentation 0 []  - Support Surface(s) Assessment (bed, cushion, seat, etc.)  0 INTERVENTIONS - Wound Cleansing / Measurement X - Wound Imaging (photographs - any number of wounds) 1 5 []  - Wound Tracing (instead of photographs) 0 X - Simple Wound Measurement - one wound 1 5 []  - Complex Wound Measurement - multiple wounds 0 X - Simple Wound Cleansing - one wound 1 5 []  -  Complex Wound Cleansing - multiple wounds 0 INTERVENTIONS - Wound Dressings X - Small Wound Dressing one or multiple wounds 1 10 []  - Medium Wound Dressing one or multiple wounds 0 []  - Large Wound Dressing one or multiple wounds 0 []  - Application of Medications - injection 0 INTERVENTIONS - Miscellaneous []  - External ear exam 0 []  - Specimen Collection (cultures, biopsies, blood, body fluids, etc.) 0 []  - Specimen(s) / Culture(s) sent or taken to Lab for analysis 0 []  - Patient Transfer (multiple staff / Harrel Lemon Lift / Similar devices) 0 []  - Simple Staple / Suture removal (25 or less) 0 []  - Complex Staple / Suture removal (26 or more) 0 []  - Hypo / Hyperglycemic Management (close monitor of Blood Glucose) 0 []  - Ankle / Brachial Index (ABI) - do not check if billed separately 0 Has the patient been seen at the hospital within the last three years: Yes Total Score: 120 Level Of Care: New/Established - Level 4 Electronic Signature(s) Signed: 09/28/2019 5:58:48 PM By: Deon Pilling Entered By: Deon Pilling on 09/28/2019 11:48:44 -------------------------------------------------------------------------------- Encounter Discharge Information Details Patient Name: Date of Service: Alan Muster. 09/28/2019 10:30 AM Medical Record [id number] Patient Account Number: [id number] Date of Birth/Sex: Treating Brown: September 01, 1951 (68 y.o. [name] Primary Care Cyla Haluska: Donavan Burnet Other Clinician: Referring Angelica Wix: Treating Chrisa Hassan/Extender:Stone III, Oneita Kras, Brown Weeks in Treatment: 0 Encounter Discharge Information Items Discharge Condition:  Stable Ambulatory Status: Cane Discharge Destination: Home Transportation: Private Auto Accompanied By: self Schedule Follow-up Appointment: Yes Clinical Summary of Care: Patient Declined Electronic Signature(s) Signed: 09/28/2019 5:37:20 PM By: Kela Millin Entered By: Kela Millin on 09/28/2019 11:46:17 -------------------------------------------------------------------------------- [place] Details Patient Name: Date of Service: Alan Muster. 09/28/2019 10:30 AM Medical Record [id number] Patient Account Number: [id number] Date of Birth/Sex: Treating Brown: December 18, 1950 (68 y.o. Hessie Diener Primary Care Nikhita Mentzel: Donavan Burnet Other Clinician: Referring Iriana Artley: Treating Mahum Betten/Extender:Stone III, Oneita Kras, Brown Weeks in Treatment: 0 Active Inactive Nutrition Nursing Diagnoses: Potential for alteratiion in Nutrition/Potential for imbalanced nutrition Goals: Patient/caregiver agrees to and verbalizes understanding of need to obtain nutritional consultation Date Initiated: 09/28/2019 Target Resolution Date: 10/21/2019 Goal Status: Active Interventions: Provide education on nutrition Treatment Activities: Patient referred to Primary Care Physician for further nutritional evaluation : 09/28/2019 Notes: Orientation to the Wound Care Program Nursing Diagnoses: Knowledge deficit related to the wound healing center program Goals: Patient/caregiver will verbalize understanding of the [place] Date Initiated: 09/28/2019 Target Resolution Date: 10/05/2019 Goal Status: Active Interventions: Provide education on orientation to the wound center Notes: Pain, Acute or Chronic Nursing Diagnoses: Pain, acute or chronic: actual or potential Potential alteration in comfort, pain Goals: Patient will verbalize adequate pain control and receive pain control interventions during procedures as needed Date  Initiated: 09/28/2019 Target Resolution Date: 10/21/2019 Goal Status: Active Patient/caregiver will verbalize adequate pain control between visits Date Initiated: 09/28/2019 Target Resolution Date: 10/21/2019 Goal Status: Active Interventions: Complete pain assessment as per visit requirements Provide education on pain management Treatment Activities: Administer pain control measures as ordered : 09/28/2019 Notes: Wound/Skin Impairment Nursing Diagnoses: Knowledge deficit related to ulceration/compromised skin integrity Goals: Patient/caregiver will verbalize understanding of skin care regimen Date Initiated: 09/28/2019 Target Resolution Date: 10/21/2019 Goal Status: Active Ulcer/skin breakdown will heal within 14 weeks Date Initiated: 09/28/2019 Target Resolution Date: 12/22/2019 Goal Status: Active Interventions: Assess patient/caregiver ability to perform ulcer/skin care regimen upon admission and as needed Assess ulceration(s) every visit Provide education on ulcer and skin care  Treatment Activities: Skin care regimen initiated : 09/28/2019 Topical wound management initiated : 09/28/2019 Notes: Electronic Signature(s) Signed: 09/28/2019 5:58:48 PM By: Shawn Stall Entered By: Shawn Stall on 09/28/2019 11:10:44 -------------------------------------------------------------------------------- Pain Assessment Details Patient Name: Date of Service: Alan Brown, Alan Brown 09/28/2019 10:30 AM Medical Record [id number] Patient Account Number: [id number] Date of Birth/Sex: Treating Brown: 07-Oct-1951 (68 y.o. Alan Petit) Alan Pax Primary Care Arina Torry: Alan Soho Other Clinician: Referring Sircharles Holzheimer: Treating Kinga Cassar/Extender:Stone III, Alan Mires, Brown Weeks in Treatment: 0 Active Problems Location of Pain Severity and Description of Pain Patient Has Paino Yes Site Locations With Dressing Change: Yes Duration of the Pain. Constant / Intermittento Intermittent How  Long Does it Lasto Hours: Minutes: 15 Rate the pain. Current Pain Level: 1 Worst Pain Level: 6 Least Pain Level: 0 Tolerable Pain Level: 5 Character of Pain Describe the Pain: Burning, Other: solid Pain Management and Medication Current Pain Management: Medication: Yes Cold Application: No Rest: Yes Massage: No Activity: No T.E.N.S.: No Heat Application: No Leg drop or elevation: No Is the Current Pain Management Adequate: Inadequate How does your wound impact your activities of daily livingo Sleep: No Bathing: No Appetite: No Relationship With Others: No Bladder Continence: No Emotions: No Bowel Continence: No Work: No Toileting: No Drive: No Dressing: No Hobbies: No Electronic Signature(s) Signed: 11/22/2019 3:01:15 PM By: Alan Pax Brown Entered By: Alan Pax on 09/28/2019 11:12:34 -------------------------------------------------------------------------------- Patient/Caregiver Education Details Patient Name: Alan Blitz 10/14/2020andnbsp10:30 Date of Service: AM Medical Record [id number] Number: Patient Account Number: [id number] Treating Brown: Date of Birth/Gender: 01-22-1951 (68 y.o. [name]) Other Clinician: Primary Care Treating Brown, Brown Lenda Kelp Physician: Physician/Extender: Referring Physician: Meliton Rattan in Treatment: 0 Education Assessment Education Provided To: Patient Education Topics Provided Nutrition: Handouts: Nutrition Methods: Explain/Verbal, Printed Responses: Reinforcements needed Welcome To The Wound Care Center: Handouts: Welcome To The Wound [facility] Methods: Explain/Verbal, Printed Responses: Reinforcements needed Wound/Skin Impairment: Handouts: Caring for Your Ulcer, Skin Care Do's and Dont's Methods: Explain/Verbal, Printed Responses: Reinforcements needed Electronic Signature(s) Signed: 09/28/2019 5:58:48 PM By: Shawn Stall Entered By: Shawn Stall on 09/28/2019  11:11:36 -------------------------------------------------------------------------------- Wound Assessment Details Patient Name: Date of Service: Alan Brown, Alan Brown 09/28/2019 10:30 AM Medical Record [id number] Patient Account Number: [id number] Date of Birth/Sex: Treating Brown: October 02, 1951 (68 y.o. Alan Petit) Alan Pax Primary Care Kristel Durkee: Alan Soho Other Clinician: Referring Laquanta Hummel: Treating Aylissa Heinemann/Extender:Stone III, Alan Mires, Brown Weeks in Treatment: 0 Wound Status Wound Number: 1 Primary Etiology: Atypical Wound Location: Neck Wound Status: Open Wounding Event: Bump Comorbid History: Rheumatoid Arthritis, Neuropathy Date Acquired: 06/14/2014 Weeks Of Treatment: 0 Clustered Wound: No Photos Wound Measurements Length: (cm) 1.2 % Reduc Width: (cm) 2.7 % Reduc Depth: (cm) 0.3 Epithel Area: (cm) 2.545 Tunnel Volume: (cm) 0.763 Underm Wound Description Full Thickness Without Exposed Support Foul Od Classification: Structures Slough/ Wound Distinct, outline attached Margin: Exudate Medium Amount: Exudate Serous Type: Exudate amber Color: Wound Bed Granulation Amount: Medium (34-66%) Granulation Quality: Pink, Pale Fascia Exp Necrotic Amount: Medium (34-66%) Fat Layer Necrotic Quality: Adherent Slough Tendon Exp Muscle Exp Joint Expo Bone Expos or After Cleansing: No Fibrino Yes Exposed Structure osed: No (Subcutaneous Tissue) Exposed: Yes osed: No osed: No sed: No ed: No tion in Area: 0% tion in Volume: 0% ialization: None ing: No ining: No Electronic Signature(s) Signed: 10/04/2019 1:32:19 PM By: Benjaman Kindler EMT/HBOT Signed: 11/22/2019 3:01:15 PM By: Alan Pax Brown Previous Signature: 09/28/2019 5:58:48 PM Version By: Shawn Stall Entered By: Benjaman Kindler on 09/29/2019 09:52:23 -------------------------------------------------------------------------------- Vitals  Details Patient Name: Date of Service: Alan Brown, Alan Brown  09/28/2019 10:30 AM Medical Record [id number] Patient Account Number: [id number] Date of Birth/Sex: Treating Brown: 01-Oct-1951 (68 y.o. Alan Petit) Alan Pax Primary Care Yuvia Plant: Alan Soho Other Clinician: Referring Angas Isabell: Treating Giovoni Bunch/Extender:Stone III, Alan Mires, Brown Weeks in Treatment: 0 Vital Signs Time Taken: 10:47 Temperature (F): 98.2 Height (in): 70 Pulse (bpm): 63 Source: Stated Respiratory Rate (breaths/min): 16 Weight (lbs): 210 Blood Pressure (mmHg): 117/59 Source: Stated Reference Range: 80 - 120 mg / dl Body Mass Index (BMI): 30.1 Electronic Signature(s) Signed: 11/22/2019 3:01:15 PM By: Alan Pax Brown Entered By: Alan Pax on 09/28/2019 10:48:34

## 2019-11-22 NOTE — Progress Notes (Signed)
Alan Brown, Alan Brown ([id number]) Visit Report for 10/28/2019 Arrival Information Details Patient Name: Date of Service: Alan Brown, Alan Brown 10/28/2019 2:45 PM Medical Record [id number] Patient Account Number: [id number] Date of Birth/Sex: Treating Brown: [birth year] (68 y.o. Jerilynn Mages) Carlene Coria Primary Care Alan Brown: Alan Brown Other Clinician: Referring Alan Brown Kidney: Treating Parminder Trapani/Extender:Robson, Diana Eves, Alan Brown: 4 Visit Information History Since Last Visit All ordered tests and consults were completed: No Patient Arrived: Ambulatory Added or deleted any medications: No Arrival Time: 14:45 Any new allergies or adverse reactions: No Accompanied By: self Had a fall or experienced change in No Transfer Assistance: None activities of daily living that may affect Patient Identification Verified: Yes risk of falls: Secondary Verification Process Completed: Yes Signs or symptoms of abuse/neglect since last No Patient Requires Transmission-Based No visito Precautions: Hospitalized since last visit: No Patient Has Alerts: No Implantable device outside of the clinic excluding No cellular tissue based products placed in the center since last visit: Pain Present Now: No Electronic Signature(s) Signed: 11/22/2019 2:49:42 PM By: Carlene Coria Brown Entered By: Carlene Coria on 10/28/2019 14:50:37 -------------------------------------------------------------------------------- Multi Wound Chart Details Patient Name: Date of Service: Alan Brown. 10/28/2019 2:45 PM Medical Record [id number] Patient Account Number: [id number] Date of Birth/Sex: Treating Brown: 05/02/1951 (68 y.o. Alan Brown Primary Care Nardos Putnam: Alan Brown Other Clinician: Referring Deshawna Mcneece: Treating Melquiades Kovar/Extender:Robson, Diana Eves, Alan Brown: 4 Vital Signs Height(in): 70 Pulse(bpm): 81 Weight(lbs): 210 Blood Pressure(mmHg):  130/88 Body Mass Index(BMI): 30 Temperature(F): 98.2 Respiratory 18 Rate(breaths/min): Photos: [1:No Photos] [N/A:N/A] Wound Location: [1:Neck] [N/A:N/A] Wounding Event: [1:Bump] [N/A:N/A] Primary Etiology: [1:Atypical] [N/A:N/A] Comorbid History: [1:Rheumatoid Arthritis, Neuropathy] [N/A:N/A] Date Acquired: [1:06/14/2014] [N/A:N/A] Weeks of Brown: [1:4] [N/A:N/A] Wound Status: [1:Open] [N/A:N/A] Measurements Alan Brown x W x D 1.7x2.5x0.1 [N/A:N/A] (cm) Area (cm) : [1:3.338] [N/A:N/A] Volume (cm) : [1:0.334] [N/A:N/A] % Reduction in Area: [1:-31.20%] [N/A:N/A] % Reduction in Volume: 56.20% [N/A:N/A] Classification: [1:Full Thickness Without Exposed Support Structures] [N/A:N/A] Exudate Amount: [1:Medium] [N/A:N/A] Exudate Type: [1:Serous] [N/A:N/A] Exudate Color: [1:amber] [N/A:N/A] Wound Margin: [1:Distinct, outline attached N/A] Granulation Amount: [1:Medium (34-66%)] [N/A:N/A] Granulation Quality: [1:Pink, Pale] [N/A:N/A] Necrotic Amount: [1:Medium (34-66%)] [N/A:N/A] Exposed Structures: [1:Fat Layer (Subcutaneous N/A Tissue) Exposed: Yes Fascia: No Tendon: No Muscle: No Joint: No Bone: No] Epithelialization: [1:None] [N/A:N/A N/A] Brown Notes Electronic Signature(s) Signed: 10/28/2019 5:20:59 PM By: Kela Millin Signed: 10/28/2019 5:33:43 PM By: Linton Ham MD Entered By: Linton Ham on 10/28/2019 15:38:21 -------------------------------------------------------------------------------- Multi-Disciplinary Care Plan Details Patient Name: Date of Service: Alan Brown. 10/28/2019 2:45 PM Medical Record [id number] Patient Account Number: [id number] Date of Birth/Sex: Treating Brown: 23-Dec-1950 (68 y.o. Alan Brown Primary Care Kaylin Schellenberg: Alan Brown Other Clinician: Referring Marquin Patino: Treating Newt Levingston/Extender:Robson, Diana Eves, Alan Brown: 4 Active Inactive Nutrition Nursing Diagnoses: Potential for  alteratiion in Nutrition/Potential for imbalanced nutrition Goals: Patient/caregiver agrees to and verbalizes understanding of need to obtain nutritional consultation Date Initiated: 09/28/2019 Target Resolution Date: 11/25/2019 Goal Status: Active Interventions: Provide education on nutrition Brown Activities: Education provided on Nutrition : 09/28/2019 Patient referred to Primary Care Physician for further nutritional evaluation : 09/28/2019 Notes: Pain, Acute or Chronic Nursing Diagnoses: Pain, acute or chronic: actual or potential Potential alteration in comfort, pain Goals: Patient will verbalize adequate pain control and receive pain control interventions during procedures as needed Date Initiated: 09/28/2019 Target Resolution Date: 11/25/2019 Goal Status: Active Patient/caregiver will verbalize adequate pain control between visits Date Initiated: 09/28/2019 Target Resolution Date: 11/25/2019 Goal Status: Active Interventions: Complete pain assessment as per visit  requirements Provide education on pain management Brown Activities: Administer pain control measures as ordered : 09/28/2019 Notes: Wound/Skin Impairment Nursing Diagnoses: Knowledge deficit related to ulceration/compromised skin integrity Goals: Patient/caregiver will verbalize understanding of skin care regimen Date Initiated: 09/28/2019 Target Resolution Date: 11/25/2019 Goal Status: Active Ulcer/skin breakdown will heal within 14 weeks Date Initiated: 09/28/2019 Target Resolution Date: 11/25/2019 Goal Status: Active Interventions: Assess patient/caregiver ability to perform ulcer/skin care regimen upon admission and as needed Assess ulceration(s) every visit Provide education on ulcer and skin care Brown Activities: Skin care regimen initiated : 09/28/2019 Topical wound management initiated : 09/28/2019 Notes: Electronic Signature(s) Signed: 10/28/2019 5:20:59 PM By: Cherylin Mylar Entered By: Cherylin Mylar on 10/28/2019 15:18:12 -------------------------------------------------------------------------------- Pain Assessment Details Patient Name: Date of Service: Alan Brown, Alan Brown 10/28/2019 2:45 PM Medical Record [id number] Patient Account Number: [id number] Date of Birth/Sex: Treating Brown: 08-17-1951 (68 y.o. Alan Brown) Yevonne Pax Primary Care Mithra Spano: Elsworth Soho Other Clinician: Referring Antwanette Wesche: Treating Zabdi Mis/Extender:Robson, Alan Brown, Alan Brown: 4 Active Problems Location of Pain Severity and Description of Pain Patient Has Paino Yes Site Locations With Dressing Change: Yes Duration of the Pain. Constant / Intermittento Intermittent How Long Does it Lasto Hours: Minutes: 15 Rate the pain. Current Pain Level: 5 Worst Pain Level: 10 Least Pain Level: 0 Tolerable Pain Level: 5 Character of Pain Describe the Pain: Aching, Burning Pain Management and Medication Current Pain Management: Medication: Yes Cold Application: No Rest: Yes Massage: No Activity: No T.E.N.S.: No Heat Application: No Leg drop or elevation: No Is the Current Pain Management Adequate: Inadequate How does your wound impact your activities of daily livingo Sleep: Yes Bathing: No Appetite: No Relationship With Others: No Bladder Continence: No Emotions: No Bowel Continence: No Work: No Toileting: No Drive: No Dressing: No Hobbies: No Electronic Signature(s) Signed: 11/22/2019 2:49:42 PM By: Yevonne Pax Brown Entered By: Yevonne Pax on 10/28/2019 14:52:06 -------------------------------------------------------------------------------- Patient/Caregiver Education Details Patient Name: Date of Service: Alan Brown 11/13/2020andnbsp2:45 PM Medical Record [id number] Patient Account Number: [id number] Date of Birth/Gender: Treating Brown: 05/26/1951 (68 y.o. Katherina Right Primary Care Physician: Elsworth Soho Other Clinician: Referring Physician: Treating Physician/Extender:Robson, Alan Brown, Alan Brown: 4 Education Assessment Education Provided To: Patient Education Topics Provided Nutrition: Methods: Explain/Verbal Responses: State content correctly Pain: Methods: Explain/Verbal Responses: State content correctly Wound/Skin Impairment: Methods: Explain/Verbal Responses: State content correctly Electronic Signature(s) Signed: 10/28/2019 5:20:59 PM By: Cherylin Mylar Entered By: Cherylin Mylar on 10/28/2019 15:18:33 -------------------------------------------------------------------------------- Wound Assessment Details Patient Name: Date of Service: Alan Brown, Alan Brown 10/28/2019 2:45 PM Medical Record [id number] Patient Account Number: [id number] Date of Birth/Sex: Treating Brown: Sep 15, 1951 (68 y.o. Alan Brown) Yevonne Pax Primary Care Lanitra Battaglini: Elsworth Soho Other Clinician: Referring Lacrecia Delval: Treating Jamaurie Bernier/Extender:Robson, Alan Brown, Alan Brown: 4 Wound Status Wound Number: 1 Primary Etiology: Atypical Wound Location: Neck Wound Status: Open Wounding Event: Bump Comorbid History: Rheumatoid Arthritis, Neuropathy Date Acquired: 06/14/2014 Weeks Of Brown: 4 Clustered Wound: No Photos Wound Measurements Length: (cm) 1.7 % Reduct Width: (cm) 2.5 % Reduct Depth: (cm) 0.1 Epitheli Area: (cm) 3.338 Tunneli Volume: (cm) 0.334 Undermi Wound Description Classification: Full Thickness Without Exposed Support Foul Od Structures Slough/ Wound Distinct, outline attached Margin: Exudate Medium Amount: Exudate Serous Type: Exudate amber Color: Wound Bed Granulation Amount: Medium (34-66%) Granulation Quality: Pink, Pale Fascia Necrotic Amount: Medium (34-66%) Fat Lay Necrotic Quality: Adherent Slough Tendon Muscle Joint E Bone Ex Electronic Signature(s) Signed: 11/01/2019 4:00:22 PM By: Benjaman Kindler EMT/HBOT Signed: 11/22/2019 2:49:42 PM By:  Yevonne PaxEpps, Alan Brown Entered By: Benjaman KindlerJones, Dedrick on 11/17/ or After Cleansing: No Fibrino Yes Exposed Structure Exposed: No er (Subcutaneous Tissue) Exposed: Yes Exposed: No Exposed: No xposed: No posed: No 2020 09:21:37 ion in Area: -31.2% ion in Volume: 56.2% alization: None ng: No ning: No -------------------------------------------------------------------------------- Vitals Details Patient Name: Date of Service: Alan BlitzNDERSON, Alan R. 10/28/2019 2:45 PM Medical Record [id number] Patient Account Number: [id number] Date of Birth/Sex: Treating Brown: 07-22-1951 (68 y.o. Alan Brown) Yevonne PaxEpps, Alan Primary Care Earnie Bechard: Elsworth SohoMITCHELL, Alan Other Clinician: Referring Amyri Frenz: Treating Irbin Fines/Extender:Robson, Alan GrammesMichael MITCHELL, Alan Brown: 4 Vital Signs Time Taken: 14:50 Temperature (F): 98.2 Height (in): 70 Pulse (bpm): 81 Weight (lbs): 210 Respiratory Rate (breaths/min): 18 Body Mass Index (BMI): 30.1 Blood Pressure (mmHg): 130/88 Reference Range: 80 - 120 mg / dl Electronic Signature(s) Signed: 11/22/2019 2:49:42 PM By: Yevonne PaxEpps, Alan Brown Entered By: Yevonne PaxEpps, Alan on 10/28/2019 14:51:27

## 2019-11-22 NOTE — Progress Notes (Signed)
JARVIN, OGREN ([id number]) Visit Report for 09/28/2019 Abuse/Suicide Risk Screen Details Patient Name: Date of Service: Alan Brown, Alan Brown 09/28/2019 10:30 AM Medical Record [id number] Patient Account Number: [id number] Date of Birth/Sex: Treating RN: [birth year] (68 y.o. Judie Petit) Yevonne Pax Primary Care Sierra Spargo: Elsworth Soho Other Clinician: Referring Lanier Felty: Treating Erabella Kuipers/Extender:Stone III, Alene Mires, L.DEAN Weeks in Treatment: 0 Abuse/Suicide Risk Screen Items Answer ABUSE RISK SCREEN: Has anyone close to you tried to hurt or harm you recentlyo No Do you feel uncomfortable with anyone in your familyo No Has anyone forced you do things that you didnt want to doo No Electronic Signature(s) Signed: 11/22/2019 3:01:15 PM By: Yevonne Pax RN Entered By: Yevonne Pax on 09/28/2019 10:56:10 -------------------------------------------------------------------------------- Activities of Daily Living Details Patient Name: Date of Service: Alan Brown, Alan Brown 09/28/2019 10:30 AM Medical Record [id number] Patient Account Number: [id number] Date of Birth/Sex: Treating RN: Oct 14, 1951 (68 y.o. Judie Petit) Yevonne Pax Primary Care Madex Seals: Elsworth Soho Other Clinician: Referring Shaley Leavens: Treating Avanni Turnbaugh/Extender:Stone III, Alene Mires, L.DEAN Weeks in Treatment: 0 Activities of Daily Living Items Answer Activities of Daily Living (Please select one for each item) Drive Automobile Completely Able Take Medications Completely Able Use Telephone Completely Able Care for Appearance Completely Able Use Toilet Completely Able Bath / Shower Completely Able Dress Self Completely Able Feed Self Completely Able Walk Completely Able Get In / Out Bed Completely Able Housework Need Assistance Prepare Meals Need Assistance Handle Money Completely Able Shop for Self Need Assistance Electronic Signature(s) Signed: 11/22/2019 3:01:15 PM By: Yevonne Pax RN Entered By:  Yevonne Pax on 09/28/2019 10:56:42 -------------------------------------------------------------------------------- Education Screening Details Patient Name: Date of Service: Alan Blitz. 09/28/2019 10:30 AM Medical Record [phone] Patient Account Number: [id number] Date of Birth/Sex: Treating RN: 12/14/1951 (68 y.o. Judie Petit) Yevonne Pax Primary Care Jeanelle Dake: Elsworth Soho Other Clinician: Referring Veona Bittman: Treating Janos Shampine/Extender:Stone III, Alene Mires, L.DEAN Weeks in Treatment: 0 Primary Learner Assessed: Patient Learning Preferences/Education Level/Primary Language Learning Preference: Explanation Highest Education Level: College or Above Preferred Language: English Cognitive Barrier Language Barrier: No Translator Needed: No Memory Deficit: No Emotional Barrier: No Cultural/Religious Beliefs Affecting Medical Care: No Physical Barrier Impaired Vision: No Impaired Hearing: No Decreased Hand dexterity: No Knowledge/Comprehension Knowledge Level: High Comprehension Level: High Ability to understand written High instructions: Ability to understand verbal High instructions: Motivation Anxiety Level: Calm Cooperation: Cooperative Education Importance: Acknowledges Need Interest in Health Problems: Asks Questions Perception: Coherent Willingness to Engage in Self- High Management Activities: Readiness to Engage in Self- High Management Activities: Electronic Signature(s) Signed: 11/22/2019 3:01:15 PM By: Yevonne Pax RN Entered By: Yevonne Pax on 09/28/2019 10:57:16 -------------------------------------------------------------------------------- Fall Risk Assessment Details Patient Name: Date of Service: Alan Blitz. 09/28/2019 10:30 AM Medical Record [id number] Patient Account Number: [id number] Date of Birth/Sex: Treating RN: [birth year] (68 y.o. Judie Petit) Yevonne Pax Primary Care Yolunda Kloos: Elsworth Soho Other  Clinician: Referring Merinda Victorino: Treating Ansar Skoda/Extender:Stone III, Alene Mires, L.DEAN Weeks in Treatment: 0 Fall Risk Assessment Items Have you had 2 or more falls in the last 12 monthso 0 No Have you had any fall that resulted in injury in the last 12 monthso 0 No FALLS RISK SCREEN History of falling - immediate or within 3 months 0 No Secondary diagnosis (Do you have 2 or more medical diagnoseso) 0 No Ambulatory aid None/bed rest/wheelchair/nurse 0 No Crutches/cane/walker 0 No Furniture 0 No Intravenous therapy Access/Saline/Heparin Lock 0 No Weak (short steps with or without shuffle, stooped but able to lift head 0 No while walking, may seek support from furniture) Impaired (short steps  with shuffle, may have difficulty arising from chair, 0 No head down, impaired balance) Mental Status Oriented to own ability 0 No Overestimates or forgets limitations 0 No Risk Level: Low Risk Score: 0 Electronic Signature(s) Signed: 11/22/2019 3:01:15 PM By: Carlene Coria RN Entered By: Carlene Coria on 09/28/2019 10:58:04 -------------------------------------------------------------------------------- Nutrition Risk Screening Details Patient Name: Date of Service: Alan Brown, Alan Brown 09/28/2019 10:30 AM Medical Record [id number] Patient Account Number: [id number] Date of Birth/Sex: Treating RN: 04/18/1951 (68 y.o. Jerilynn Mages) Carlene Coria Primary Care Ghadeer Kastelic: Donavan Burnet Other Clinician: Referring Suzzane Quilter: Treating Thos Matsumoto/Extender:Stone III, Oneita Kras, L.DEAN Weeks in Treatment: 0 Height (in): 70 Weight (lbs): 210 Body Mass Index (BMI): 30.1 Nutrition Risk Screening Items Score Screening NUTRITION RISK SCREEN: I have an illness or condition that made me change the kind and/or 0 No amount of food I eat I eat fewer than two meals per day 0 No I eat few fruits and vegetables, or milk products 0 No I have three or more drinks of beer, liquor or wine almost every day 0  No I have tooth or mouth problems that make it hard for me to eat 0 No I don't always have enough money to buy the food I need 0 No I eat alone most of the time 1 Yes I take three or more different prescribed or over-the-counter drugs a day 1 Yes 0 No Without wanting to, I have lost or gained 10 pounds in the last six months I am not always physically able to shop, cook and/or feed myself 2 Yes Nutrition Protocols Good Risk Protocol Provide education on Moderate Risk Protocol 0 nutrition High Risk Proctocol Risk Level: Moderate Risk Score: 4 Electronic Signature(s) Signed: 11/22/2019 3:01:15 PM By: Carlene Coria RN Entered By: Carlene Coria on 09/28/2019 10:58:26

## 2019-11-22 NOTE — Progress Notes (Signed)
Alan Brown, Alan Brown ([id number]) Visit Report for 09/28/2019 Chief Complaint Document Details Patient Name: Date of Service: Alan Brown, Alan Brown 09/28/2019 10:30 AM Medical Record [id number] Patient Account Number: [id number] Date of Birth/Sex: Treating RN: 1950/12/29 (68 y.o. Alan Brown Primary Care Provider: Elsworth Soho Other Clinician: Referring Provider: Treating Provider/Extender:Stone III, Alene Mires, L.DEAN Weeks in Treatment: 0 Information Obtained from: Patient Chief Complaint Neck ulcer Electronic Signature(s) Signed: 09/28/2019 6:05:26 PM By: Lenda Kelp PA-C Entered By: Lenda Kelp on 09/28/2019 11:18:34 -------------------------------------------------------------------------------- HPI Details Patient Name: Date of Service: Alan Brown, Alan Brown 09/28/2019 10:30 AM Medical Record [id number] Patient Account Number: [id number] Date of Birth/Sex: Treating RN: 08/24/1951 (68 y.o. Alan Brown Primary Care Provider: Elsworth Soho Other Clinician: Referring Provider: Treating Provider/Extender:Stone III, Alene Mires, L.DEAN Weeks in Treatment: 0 History of Present Illness HPI Description: 09/28/2019 on evaluation today patient presents for initial evaluation here in our clinic concerning issues that he has been having for roughly 5 years. He has a referral from the [place] clinic he is apparently been seeing orthopedics they were unable to get things healed as far as his wound is concerned. He believes this began as a result of initially having shingles a number of years ago. With that being said he tells me that the biggest issue that he finds right now is that he is constantly is picking and messing with the area. He states that he is really not able to keep a bandage on because whenever he puts the bandage on he will subsequently take it off and just short order and then begin picking at the area yet again. He has been trying to put  some antibiotic ointment on it is been using Neosporin with pain relief that is lidocaine and subsequently he has really done the application of this even up to 8-10 times a day. Again he is trying to tell me by telling me this that that is when he gets some relief as far as the pain is concerned. With that being said I think the biggest issue with his discomfort is that the wound is drying out. I think the wound is drying out because he is leaving it open to air all the time for the most part and really is not keeping a dressing on this. He apparently does have a psychiatrist although I think he may need to see psychology as well since there does not appear to be any medication that can help him based on what he tells me the psychiatrist told me. Therefore he may need to have some work or help with behaviors in general in order to be able to keep her dressing in place and allow this area to actually heal. He does have an issue apparently with chronic pain he has had ongoing issues in that regard. He also has bipolar disorder and apparently based on what I am hearing today excessive compulsive behaviors although I am not sure he is actually been diagnosed as obsessive- compulsive disorder. No fevers, chills, nausea, vomiting, or diarrhea. Electronic Signature(s) Signed: 09/28/2019 6:05:26 PM By: Lenda Kelp PA-C Entered By: Lenda Kelp on 09/28/2019 11:52:44 -------------------------------------------------------------------------------- Physical Exam Details Patient Name: Date of Service: Alan Brown, Alan Brown 09/28/2019 10:30 AM Medical Record [id number] Patient Account Number: [id number] Date of Birth/Sex: Treating RN: 08-20-1951 (68 y.o. Alan Brown Primary Care Provider: Elsworth Soho Other Clinician: Referring Provider: Treating Provider/Extender:Stone III, Alene Mires, L.DEAN Weeks in Treatment: 0 Constitutional sitting or standing blood pressure is within  target range for patient.. pulse regular and within target range for patient.Marland Kitchen respirations regular, non-labored and within target range for patient.Marland Kitchen temperature within target range for patient.. Well-nourished and well-hydrated in no acute distress. Eyes conjunctiva clear no eyelid edema noted. pupils equal round and reactive to light and accommodation. Ears, Nose, Mouth, and Throat no gross abnormality of ear auricles or external auditory canals. normal hearing noted during conversation. mucus membranes moist. Respiratory normal breathing without difficulty. clear to auscultation bilaterally. Cardiovascular regular rate and rhythm with normal S1, S2. no clubbing, cyanosis, significant edema, <3 sec cap refill. Gastrointestinal (GI) soft, non-tender, non-distended, +BS. no ventral hernia noted. Musculoskeletal normal gait and posture. no significant deformity or arthritic changes, no loss or range of motion, no clubbing. Psychiatric this patient is able to make decisions and demonstrates good insight into disease process. Alert and Oriented x 3. pleasant and cooperative. Notes Upon inspection today patient's wound bed actually appears to be somewhat dry and it does not show any signs of active infection at this time that is good news. With that being said I believe is dry because he is not really keeping a dressing on this area I think he would actually do quite well with collagen as far as a dressing of choice to try to get this area to heal more effectively. He seems to have some good granulation and there is even still here in some areas attempting to grow through the base of the wound. Obviously I think there is good blood supply as well and I do not see any obvious signs of a cyst at this point. And again there is no signs of infection. Electronic Signature(s) Signed: 09/28/2019 6:05:26 PM By: Lenda Kelp PA-C Entered By: Lenda Kelp on 09/28/2019  11:53:27 -------------------------------------------------------------------------------- Physician Orders Details Patient Name: Date of Service: Alan Brown, Alan Brown 09/28/2019 10:30 AM Medical Record [id number] Patient Account Number: [id number] Date of Birth/Sex: Treating RN: [birth year] (68 y.o. Alan Brown Primary Care Provider: Elsworth Soho Other Clinician: Referring Provider: Treating Provider/Extender:Stone III, Alene Mires, L.DEAN Weeks in Treatment: 0 Verbal / Phone Orders: No Diagnosis Coding ICD-10 Coding Code Description S11.80XA Unspecified open wound of other specified part of neck, initial encounter L98.492 Non-pressure chronic ulcer of skin of other sites with fat layer exposed R46.81 Obsessive-compulsive behavior F31.9 Bipolar disorder, unspecified Follow-up Appointments Return Appointment in 1 week. Dressing Change Frequency Change Dressing every other day. Skin Barriers/Peri-Wound Care Skin Prep - apply around the wound to aid in keeping dressing in place. Wound Cleansing May shower and wash wound with soap and water. Primary Wound Dressing Wound #1 Neck Silver Collagen - moisten with saline then apply directly to wound bed. Secondary Dressing Wound #1 Neck Foam Border - or bordered gauze. Apply bordered gauze over the moisten silver collagen. Additional Orders / Instructions Other: [facility] psychologist - Refer patient to [facility] psychologist Dr. Jilda Panda related to tendency constantly touching of wound and frequently removing dressing. - (ICD10 S11.80XA - Unspecified open wound of other specified part of neck, initial encounter) Patient Medications Allergies: morphine Notifications Medication Indication Start End lidocaine DOSE topical 4 % gel - gel topical applied prior to debridement by PA. Electronic Signature(s) Signed: 09/28/2019 5:58:48 PM By: Shawn Stall Signed:  09/28/2019 6:05:26 PM By: Lenda Kelp PA-C Entered By: Shawn Stall on 09/28/2019 11:34:11 -------------------------------------------------------------------------------- Prescription 09/28/2019 Patient Name: Alan Blitz. Provider: Lenda Kelp PA Date of Birth: [birth year] NPI#: [id number]  Sex: Jenetta Downer: [id number] Phone #: [phone] License #: Patient Address: Eligha Bridegroom [facility] [place], [place] [address] [address] [place], [place] [address] [phone] Allergies morphine Reaction: seeing things Severity: Mild Provider's Orders psychologist - ICD10: S11.80XA - [facility]er patient to [facility] psychologist Dr. Jilda Panda related to tendency constantly touching of wound and frequently removing dressing. Signature(s): Date(s): Prescription 09/28/2019 Patient Name: Alan Brown, TREECE. Provider: Lenda Kelp PA Date of Birth: [birth year] NPI#: [id number] Sex: Jenetta Downer: [id number] Phone #: [phone] License #: Patient Address: Eligha Bridegroom [facility] [place], [place] [address] [address] [place], [place] [address] [phone] Allergies morphine Reaction: seeing things Severity: Mild Medication Medication: Route: Strength: Form: lidocaine 4 % topical gel topical 4% gel Class: TOPICAL LOCAL ANESTHETICS Dose: Frequency / Time: Indication: gel topical applied prior to debridement by PA. Number of Refills: Number of Units: 0 Generic Substitution: Start Date: End Date: One Time Use: Substitution Permitted No Note to Pharmacy: Signature(s): Date(s): Electronic Signature(s) Signed: 09/28/2019 5:58:48 PM By: Shawn Stall Signed: 09/28/2019 6:05:26 PM By: Lenda Kelp PA-C Entered By: Shawn Stall on 09/28/2019 11:34:11 --------------------------------------------------------------------------------  Problem List Details Patient  Name: Date of Service: Alan Blitz. 09/28/2019 10:30 AM Medical Record [id number] Patient Account Number: [id number] Date of Birth/Sex: Treating RN: 10-05-1951 (69 y.o. Alan Brown Primary Care Provider: Elsworth Soho Other Clinician: Referring Provider: Treating Provider/Extender:Stone III, Alene Mires, L.DEAN Weeks in Treatment: 0 Active Problems ICD-10 Evaluated Encounter Code Description Active Date Today Diagnosis S11.80XA Unspecified open wound of other specified part of 09/28/2019 No Yes neck, initial encounter L98.492 Non-pressure chronic ulcer of skin of other sites with 09/28/2019 No Yes fat layer exposed R46.81 Obsessive-compulsive behavior 09/28/2019 No Yes F31.9 Bipolar disorder, unspecified 09/28/2019 No Yes Inactive Problems Resolved Problems Electronic Signature(s) Signed: 09/28/2019 6:05:26 PM By: Lenda Kelp PA-C Entered By: Lenda Kelp on 09/28/2019 11:18:26 -------------------------------------------------------------------------------- Progress Note Details Patient Name: Date of Service: Alan Blitz. 09/28/2019 10:30 AM Medical Record [id number] Patient Account Number: [id number] Date of Birth/Sex: Treating RN: 1951/03/01 (68 y.o. Alan Brown Primary Care Provider: Elsworth Soho Other Clinician: Referring Provider: Treating Provider/Extender:Stone III, Alene Mires, L.DEAN Weeks in Treatment: 0 Subjective Chief Complaint Information obtained from Patient Neck ulcer History of Present Illness (HPI) 09/28/2019 on evaluation today patient presents for initial evaluation here in our clinic concerning issues that he has been having for roughly 5 years. He has a referral from the [place] clinic he is apparently been seeing orthopedics they were unable to get things healed as far as his wound is concerned. He believes this began as a result of initially having shingles a number of years ago. With that being said  he tells me that the biggest issue that he finds right now is that he is constantly is picking and messing with the area. He states that he is really not able to keep a bandage on because whenever he puts the bandage on he will subsequently take it off and just short order and then begin picking at the area yet again. He has been trying to put some antibiotic ointment on it is been using Neosporin with pain relief that is lidocaine and subsequently he has really done the application of this even up to 8- 10 times a day. Again he is trying to tell me by telling me this that that is when  he gets some relief as far as the pain is concerned. With that being said I think the biggest issue with his discomfort is that the wound is drying out. I think the wound is drying out because he is leaving it open to air all the time for the most part and really is not keeping a dressing on this. He apparently does have a psychiatrist although I think he may need to see psychology as well since there does not appear to be any medication that can help him based on what he tells me the psychiatrist told me. Therefore he may need to have some work or help with behaviors in general in order to be able to keep her dressing in place and allow this area to actually heal. He does have an issue apparently with chronic pain he has had ongoing issues in that regard. He also has bipolar disorder and apparently based on what I am hearing today excessive compulsive behaviors although I am not sure he is actually been diagnosed as obsessive- compulsive disorder. No fevers, chills, nausea, vomiting, or diarrhea. Patient History Information obtained from Patient. Allergies morphine (Severity: Mild, Reaction: seeing things) Family History Diabetes - Siblings, No family history of Cancer, Heart Disease, Hereditary Spherocytosis, Hypertension, Kidney Disease, Lung Disease, Seizures, Stroke, Thyroid Problems, Tuberculosis. Social  History Former smoker, Marital Status - Separated, Alcohol Use - Never, Drug Use - Prior History, Caffeine Use - Moderate. Medical History Eyes Denies history of Cataracts, Glaucoma, Optic Neuritis Ear/Nose/Mouth/Throat Denies history of Chronic sinus problems/congestion, Middle ear problems Hematologic/Lymphatic Denies history of Anemia, Hemophilia, Human Immunodeficiency Virus, Lymphedema, Sickle Cell Disease Respiratory Denies history of Aspiration, Asthma, Chronic Obstructive Pulmonary Disease (COPD), Pneumothorax, Sleep Apnea, Tuberculosis Cardiovascular Denies history of Angina, Arrhythmia, Congestive Heart Failure, Coronary Artery Disease, Deep Vein Thrombosis, Hypertension, Hypotension, Myocardial Infarction, Peripheral Arterial Disease, Peripheral Venous Disease, Phlebitis, Vasculitis Gastrointestinal Denies history of Cirrhosis , Colitis, Crohnoos, Hepatitis A, Hepatitis B, Hepatitis C Endocrine Denies history of Type I Diabetes, Type II Diabetes Genitourinary Denies history of End Stage Renal Disease Immunological Denies history of Lupus Erythematosus, Raynaudoos, Scleroderma Musculoskeletal Patient has history of Rheumatoid Arthritis Denies history of Gout, Osteoarthritis, Osteomyelitis Neurologic Patient has history of Neuropathy Denies history of Dementia, Quadriplegia, Paraplegia, Seizure Disorder Oncologic Denies history of Received Chemotherapy, Received Radiation Psychiatric Denies history of Anorexia/bulimia, Confinement Anxiety Review of Systems (ROS) Constitutional Symptoms (General Health) Denies complaints or symptoms of Fatigue, Fever, Chills, Marked Weight Change. Eyes Denies complaints or symptoms of Dry Eyes, Vision Changes, Glasses / Contacts. Ear/Nose/Mouth/Throat Denies complaints or symptoms of Chronic sinus problems or rhinitis. Respiratory Denies complaints or symptoms of Chronic or frequent coughs, Shortness of  Breath. Cardiovascular Denies complaints or symptoms of Chest pain. Gastrointestinal Denies complaints or symptoms of Frequent diarrhea, Nausea, Vomiting. Endocrine Denies complaints or symptoms of Heat/cold intolerance. Genitourinary Denies complaints or symptoms of Frequent urination. Integumentary (Skin) Complains or has symptoms of Wounds. Musculoskeletal Denies complaints or symptoms of Muscle Pain, Muscle Weakness. Neurologic Denies complaints or symptoms of Numbness/parasthesias. Psychiatric Denies complaints or symptoms of Claustrophobia, Suicidal. Objective Constitutional sitting or standing blood pressure is within target range for patient.. pulse regular and within target range for patient.Marland Kitchen respirations regular, non-labored and within target range for patient.Marland Kitchen temperature within target range for patient.. Well-nourished and well-hydrated in no acute distress. Vitals Time Taken: 10:47 AM, Height: 70 in, Source: Stated, Weight: 210 lbs, Source: Stated, BMI: 30.1, Temperature: 98.2 F, Pulse: 63 bpm, Respiratory Rate: 16 breaths/min, Blood Pressure:  117/59 mmHg. Eyes conjunctiva clear no eyelid edema noted. pupils equal round and reactive to light and accommodation. Ears, Nose, Mouth, and Throat no gross abnormality of ear auricles or external auditory canals. normal hearing noted during conversation. mucus membranes moist. Respiratory normal breathing without difficulty. clear to auscultation bilaterally. Cardiovascular regular rate and rhythm with normal S1, S2. no clubbing, cyanosis, significant edema, Gastrointestinal (GI) soft, non-tender, non-distended, +BS. no ventral hernia noted. Musculoskeletal normal gait and posture. no significant deformity or arthritic changes, no loss or range of motion, no clubbing. Psychiatric this patient is able to make decisions and demonstrates good insight into disease process. Alert and Oriented x 3. pleasant and  cooperative. General Notes: Upon inspection today patient's wound bed actually appears to be somewhat dry and it does not show any signs of active infection at this time that is good news. With that being said I believe is dry because he is not really keeping a dressing on this area I think he would actually do quite well with collagen as far as a dressing of choice to try to get this area to heal more effectively. He seems to have some good granulation and there is even still here in some areas attempting to grow through the base of the wound. Obviously I think there is good blood supply as well and I do not see any obvious signs of a cyst at this point. And again there is no signs of infection. Integumentary (Hair, Skin) Wound #1 status is Open. Original cause of wound was Bump. The wound is located on the Neck. The wound measures 1.2cm length x 2.7cm width x 0.3cm depth; 2.545cm^2 area and 0.763cm^3 volume. There is Fat Layer (Subcutaneous Tissue) Exposed exposed. There is no tunneling or undermining noted. There is a medium amount of serous drainage noted. The wound margin is distinct with the outline attached to the wound base. There is medium (34-66%) pink, pale granulation within the wound bed. There is a medium (34-66%) amount of necrotic tissue within the wound bed including Adherent Slough. Assessment Active Problems ICD-10 Unspecified open wound of other specified part of neck, initial encounter Non-pressure chronic ulcer of skin of other sites with fat layer exposed Obsessive-compulsive behavior Bipolar disorder, unspecified Plan Follow-up Appointments: Return Appointment in 1 week. Dressing Change Frequency: Change Dressing every other day. Skin Barriers/Peri-Wound Care: Skin Prep - apply around the wound to aid in keeping dressing in place. Wound Cleansing: May shower and wash wound with soap and water. Primary Wound Dressing: Wound #1 Neck: Silver Collagen - moisten  with saline then apply directly to wound bed. Secondary Dressing: Wound #1 Neck: Foam Border - or bordered gauze. Apply bordered gauze over the moisten silver collagen. Additional Orders / Instructions: Other: - [facility] ordered were: psychologist - Refer patient to [facility] psychologist Dr. Jilda Panda related to tendency constantly touching of wound and frequently removing dressing. The following medication(s) was prescribed: lidocaine topical 4 % gel gel topical applied prior to debridement by PA. was prescribed at facility 1. The patient has a psychiatrist currently but has been told there is no medication that will help with his current obsessive-compulsive behavior. With that being said and for that reason I would like to see about the patient seeing a psychologist at the [facility] and will get a put in that referral today back to the [place]. 2. I am going to suggest a border gauze dressing along with using silver collagen to try  to help with getting the area to heal I believe this could be beneficial if he can keep the dressing in place. 3. I do think it is good to be of utmost importance for the patient to leave this area alone change and there is at most once a day although really every other day would be ideal. I discussed this with the patient we will see how he does over the next week. We will see patient back for reevaluation in 1 week here in the clinic. If anything worsens or changes patient will contact our office for additional recommendations. Electronic Signature(s) Signed: 09/28/2019 6:05:26 PM By: Worthy Keeler PA-C Entered By: Worthy Keeler on 09/28/2019 11:54:46 -------------------------------------------------------------------------------- HxROS Details Patient Name: Date of Service: Alan Brown, Alan Brown 09/28/2019 10:30 AM Medical Record [id number] Patient Account Number: [id number] Date of Birth/Sex: Treating  RN: 1951/10/08 (68 y.o. Jerilynn Mages) Carlene Coria Primary Care Provider: Donavan Burnet Other Clinician: Referring Provider: Treating Provider/Extender:Stone III, Oneita Kras, L.DEAN Weeks in Treatment: 0 Information Obtained From Patient Constitutional Symptoms (General Health) Complaints and Symptoms: Negative for: Fatigue; Fever; Chills; Marked Weight Change Eyes Complaints and Symptoms: Negative for: Dry Eyes; Vision Changes; Glasses / Contacts Medical History: Negative for: Cataracts; Glaucoma; Optic Neuritis Ear/Nose/Mouth/Throat Complaints and Symptoms: Negative for: Chronic sinus problems or rhinitis Medical History: Negative for: Chronic sinus problems/congestion; Middle ear problems Respiratory Complaints and Symptoms: Negative for: Chronic or frequent coughs; Shortness of Breath Medical History: Negative for: Aspiration; Asthma; Chronic Obstructive Pulmonary Disease (COPD); Pneumothorax; Sleep Apnea; Tuberculosis Cardiovascular Complaints and Symptoms: Negative for: Chest pain Medical History: Negative for: Angina; Arrhythmia; Congestive Heart Failure; Coronary Artery Disease; Deep Vein Thrombosis; Hypertension; Hypotension; Myocardial Infarction; Peripheral Arterial Disease; Peripheral Venous Disease; Phlebitis; Vasculitis Gastrointestinal Complaints and Symptoms: Negative for: Frequent diarrhea; Nausea; Vomiting Medical History: Negative for: Cirrhosis ; Colitis; Crohns; Hepatitis A; Hepatitis B; Hepatitis C Endocrine Complaints and Symptoms: Negative for: Heat/cold intolerance Medical History: Negative for: Type I Diabetes; Type II Diabetes Genitourinary Complaints and Symptoms: Negative for: Frequent urination Medical History: Negative for: End Stage Renal Disease Integumentary (Skin) Complaints and Symptoms: Positive for: Wounds Musculoskeletal Complaints and Symptoms: Negative for: Muscle Pain; Muscle Weakness Medical History: Positive for:  Rheumatoid Arthritis Negative for: Gout; Osteoarthritis; Osteomyelitis Neurologic Complaints and Symptoms: Negative for: Numbness/parasthesias Medical History: Positive for: Neuropathy Negative for: Dementia; Quadriplegia; Paraplegia; Seizure Disorder Psychiatric Complaints and Symptoms: Negative for: Claustrophobia; Suicidal Medical History: Negative for: Anorexia/bulimia; Confinement Anxiety Hematologic/Lymphatic Medical History: Negative for: Anemia; Hemophilia; Human Immunodeficiency Virus; Lymphedema; Sickle Cell Disease Immunological Medical History: Negative for: Lupus Erythematosus; Raynauds; Scleroderma Oncologic Medical History: Negative for: Received Chemotherapy; Received Radiation Immunizations Pneumococcal Vaccine: Received Pneumococcal Vaccination: No Implantable Devices None Family and Social History Cancer: No; Diabetes: Yes - Siblings; Heart Disease: No; Hereditary Spherocytosis: No; Hypertension: No; Kidney Disease: No; Lung Disease: No; Seizures: No; Stroke: No; Thyroid Problems: No; Tuberculosis: No; Former smoker; Marital Status - Separated; Alcohol Use: Never; Drug Use: Prior History; Caffeine Use: Moderate; Financial Concerns: No; Food, Clothing or Shelter Needs: No; Support System Lacking: No; Transportation Concerns: No Electronic Signature(s) Signed: 09/28/2019 6:05:26 PM By: Worthy Keeler PA-C Signed: 11/22/2019 3:01:15 PM By: Carlene Coria RN Entered By: Carlene Coria on 09/28/2019 10:56:03 -------------------------------------------------------------------------------- SuperBill Details Patient Name: Date of Service: Alan Brown 09/28/2019 Medical Record [id number] Patient Account Number: [id number] Date of Birth/Sex: Treating RN: 26-Apr-1951 (68 y.o. Hessie Diener Primary Care Provider: Donavan Burnet Other Clinician: Referring Provider: Treating Provider/Extender:Stone III, Oneita Kras, L.DEAN Weeks in Treatment:  0 Diagnosis Coding ICD-10 Codes Code Description S11.80XA Unspecified open wound of other specified part of neck, initial encounter L98.492 Non-pressure chronic ulcer of skin of other sites with fat layer exposed R46.81 Obsessive-compulsive behavior F31.9 Bipolar disorder, unspecified Facility Procedures The patient participates with Medicare or their insurance follows the Medicare Facility Guidelines: CPT4 Code Description Modifier Quantity [id number] 99214 - WOUND CARE VISIT-LEV 4 EST PT 1 Physician Procedures CPT4 Code Description: 5409811 [id number] - WC PHYS LEVEL 4 - NEW PT ICD-10 Diagnosis Description S11.80XA Unspecified open wound of other specified part of neck L98.492 Non-pressure chronic ulcer of skin of other sites with R46.81 Obsessive-compulsive  behavior F31.9 Bipolar disorder, unspecified Modifier: , initial encou fat layer expo Quantity: 1 nter sed Electronic Signature(s) Signed: 09/28/2019 6:05:26 PM By: Lenda KelpStone III, Hoyt PA-C Entered By: Lenda KelpStone III, Hoyt on 09/28/2019 12:41:57

## 2020-03-06 ENCOUNTER — Inpatient Hospital Stay (HOSPITAL_COMMUNITY)
Admission: EM | Admit: 2020-03-06 | Discharge: 2020-03-07 | DRG: 072 | Discharge status: Against Medical Advice | Payer: No Typology Code available for payment source | Attending: Internal Medicine | Admitting: Internal Medicine

## 2020-03-06 ENCOUNTER — Emergency Department (HOSPITAL_COMMUNITY): Payer: No Typology Code available for payment source

## 2020-03-06 ENCOUNTER — Encounter (HOSPITAL_COMMUNITY): Payer: Self-pay | Admitting: *Deleted

## 2020-03-06 ENCOUNTER — Other Ambulatory Visit: Payer: Self-pay

## 2020-03-06 DIAGNOSIS — F419 Anxiety disorder, unspecified: Secondary | ICD-10-CM | POA: Diagnosis present

## 2020-03-06 DIAGNOSIS — Z79899 Other long term (current) drug therapy: Secondary | ICD-10-CM

## 2020-03-06 DIAGNOSIS — Z7982 Long term (current) use of aspirin: Secondary | ICD-10-CM

## 2020-03-06 DIAGNOSIS — E869 Volume depletion, unspecified: Secondary | ICD-10-CM | POA: Diagnosis present

## 2020-03-06 DIAGNOSIS — R824 Acetonuria: Secondary | ICD-10-CM | POA: Diagnosis present

## 2020-03-06 DIAGNOSIS — G8929 Other chronic pain: Secondary | ICD-10-CM | POA: Diagnosis present

## 2020-03-06 DIAGNOSIS — Z8249 Family history of ischemic heart disease and other diseases of the circulatory system: Secondary | ICD-10-CM

## 2020-03-06 DIAGNOSIS — Z87891 Personal history of nicotine dependence: Secondary | ICD-10-CM

## 2020-03-06 DIAGNOSIS — G9340 Encephalopathy, unspecified: Principal | ICD-10-CM | POA: Diagnosis present

## 2020-03-06 DIAGNOSIS — R251 Tremor, unspecified: Secondary | ICD-10-CM | POA: Diagnosis not present

## 2020-03-06 DIAGNOSIS — Z8701 Personal history of pneumonia (recurrent): Secondary | ICD-10-CM

## 2020-03-06 DIAGNOSIS — M545 Low back pain: Secondary | ICD-10-CM | POA: Diagnosis present

## 2020-03-06 DIAGNOSIS — K219 Gastro-esophageal reflux disease without esophagitis: Secondary | ICD-10-CM | POA: Diagnosis present

## 2020-03-06 DIAGNOSIS — Z20822 Contact with and (suspected) exposure to covid-19: Secondary | ICD-10-CM | POA: Diagnosis present

## 2020-03-06 DIAGNOSIS — Z8673 Personal history of transient ischemic attack (TIA), and cerebral infarction without residual deficits: Secondary | ICD-10-CM

## 2020-03-06 DIAGNOSIS — R4182 Altered mental status, unspecified: Secondary | ICD-10-CM

## 2020-03-06 DIAGNOSIS — F319 Bipolar disorder, unspecified: Secondary | ICD-10-CM | POA: Diagnosis present

## 2020-03-06 DIAGNOSIS — M1990 Unspecified osteoarthritis, unspecified site: Secondary | ICD-10-CM | POA: Diagnosis present

## 2020-03-06 DIAGNOSIS — Z833 Family history of diabetes mellitus: Secondary | ICD-10-CM

## 2020-03-06 LAB — CBC
HCT: 44.1 % (ref 39.0–52.0)
Hemoglobin: 14.4 g/dL (ref 13.0–17.0)
MCH: 31.4 pg (ref 26.0–34.0)
MCHC: 32.7 g/dL (ref 30.0–36.0)
MCV: 96.3 fL (ref 80.0–100.0)
Platelets: 228 10*3/uL (ref 150–400)
RBC: 4.58 MIL/uL (ref 4.22–5.81)
RDW: 13.8 % (ref 11.5–15.5)
WBC: 13.5 10*3/uL — ABNORMAL HIGH (ref 4.0–10.5)
nRBC: 0 % (ref 0.0–0.2)

## 2020-03-06 LAB — COMPREHENSIVE METABOLIC PANEL
ALT: 13 U/L (ref 0–44)
AST: 22 U/L (ref 15–41)
Albumin: 4 g/dL (ref 3.5–5.0)
Alkaline Phosphatase: 56 U/L (ref 38–126)
Anion gap: 11 (ref 5–15)
BUN: 22 mg/dL (ref 8–23)
CO2: 27 mmol/L (ref 22–32)
Calcium: 9.4 mg/dL (ref 8.9–10.3)
Chloride: 105 mmol/L (ref 98–111)
Creatinine, Ser: 1.17 mg/dL (ref 0.61–1.24)
GFR calc Af Amer: >60 mL/min (ref >60)
GFR calc non Af Amer: >60 mL/min (ref >60)
Glucose, Bld: 135 mg/dL — ABNORMAL HIGH (ref 70–99)
Potassium: 3.9 mmol/L (ref 3.5–5.1)
Sodium: 143 mmol/L (ref 135–145)
Total Bilirubin: 0.7 mg/dL (ref 0.3–1.2)
Total Protein: 8 g/dL (ref 6.5–8.1)

## 2020-03-06 LAB — URINALYSIS, ROUTINE W REFLEX MICROSCOPIC
Bilirubin Urine: NEGATIVE
Glucose, UA: NEGATIVE mg/dL
Hgb urine dipstick: NEGATIVE
Ketones, ur: 5 mg/dL — AB
Leukocytes,Ua: NEGATIVE
Nitrite: NEGATIVE
Protein, ur: NEGATIVE mg/dL
Specific Gravity, Urine: 1.015 (ref 1.005–1.030)
pH: 7 (ref 5.0–8.0)

## 2020-03-06 LAB — VALPROIC ACID LEVEL: Valproic Acid Lvl: 37 ug/mL — ABNORMAL LOW (ref 50.0–100.0)

## 2020-03-06 LAB — AMMONIA: Ammonia: 18 umol/L (ref 9–35)

## 2020-03-06 LAB — CBG MONITORING, ED: Glucose-Capillary: 121 mg/dL — ABNORMAL HIGH (ref 70–99)

## 2020-03-06 MED ORDER — SODIUM CHLORIDE 0.9% FLUSH: 3.0000 mL | Freq: Once | INTRAVENOUS | Status: DC

## 2020-03-06 NOTE — ED Provider Notes (Signed)
[facility]-EMERGENCY DEPT Provider Note   CSN: [id number] Arrival date & time: 03/06/20  1952     History Chief Complaint  Patient presents with  . Altered Mental Status  . Tremors    Alan Brown is a 69 y.o. male. Level 5 caveat due to some confusion. HPI Patient presents because reportedly he is confused and shaky.  Patient thinks he may have had too much medicine.  States recently started on Nucynta and thinks he may have had too much of it.  Has had previous admissions to the hospital with polypharmacy.  Denies fevers but states he is chilled.  States he is shaky and very cold.  No cough.  No dysuria.  No nausea vomiting or diarrhea.  Reportedly got 50 mg of Benadryl IM by EMS.    Past Medical History:  Diagnosis Date  . Anxiety   . Arthritis    "all through my body" (01/26/2014)  . Bipolar 1 disorder (HCC)   . Chronic lower back pain   . Depression   . Depression   . GERD (gastroesophageal reflux disease)   . Pneumonia 1952; 11/2013  . Prolapse, disk    lower back  . Stroke [facility])    pt reported TIA    Patient Active Problem List   Diagnosis Date Noted  . S/P right rotator cuff repair 08/10/2019  . Impingement syndrome of right shoulder   . Acute respiratory failure with hypoxia (HCC) 04/23/2019  . Tear of right supraspinatus tendon 03/23/2019  . Arthrosis of right acromioclavicular joint 03/23/2019  . Depression 01/24/2014  . Polysubstance (including opioids) dependence, daily use (HCC) 01/24/2014  . Chronic pain syndrome 01/24/2014  . Sepsis (HCC) 01/23/2014  . Altered mental status 01/23/2014  . Acute encephalopathy 11/19/2013  . SIRS (systemic inflammatory response syndrome) (HCC) 11/19/2013  . Community acquired pneumonia 11/19/2013  . GERD (gastroesophageal reflux disease) 11/19/2013  . Bipolar 1 disorder (HCC) 11/19/2013  . Lactic acidosis 11/19/2013  . Pneumonia 11/19/2013    Past Surgical History:  Procedure  Laterality Date  . APPENDECTOMY  1961  . INGUINAL HERNIA REPAIR Left 1998  . SHOULDER ARTHROSCOPY WITH ROTATOR CUFF REPAIR AND SUBACROMIAL DECOMPRESSION Right 06/29/2019   Procedure: RIGHT SHOULDER ARTHROSCOPY WITH ROTATOR CUFF REPAIR, BICEPS TENO SUBACROMIAL DECOMPRESSION, DISTAL CLAVICLE EXCISION, EXTENSIVE DEBRIDEMENT;  Surgeon: Tarry Kos, MD;  Location: [facility];  Service: Orthopedics;  Laterality: Right;  . TONSILLECTOMY  1976       Family History  Problem Relation Age of Onset  . Hypertension Mother   . Hypertension Father   . Aneurysm Father   . Other Sister        killed  . Diabetes Sister   . Heart disease Sister     Social History   Tobacco Use  . Smoking status: Former Smoker    Packs/day: 1.00    Years: 25.00    Pack years: 25.00    Types: Cigarettes  . Smokeless tobacco: Never Used  . Tobacco comment: 01/26/2014 "quit smoking 15-16 yr ago"  Substance Use Topics  . Alcohol use: No    Comment:  "quit drinking 25 yr ago; I'm a recovering alcoholic"  . Drug use: No    Home Medications Prior to Admission medications   Medication Sig Start Date End Date Taking? Authorizing Provider  albuterol (VENTOLIN HFA) 108 (90 Base) MCG/ACT inhaler Inhale 2 puffs into the lungs 4 (four) times daily as needed for wheezing or shortness of breath.  Yes [provider]  aspirin EC 81 MG tablet Take 81 mg by mouth daily.   Yes [provider]  atorvastatin (LIPITOR) 20 MG tablet Take 20 mg by mouth at bedtime.   Yes [provider]  busPIRone (BUSPAR) 15 MG tablet Take 15 mg by mouth 2 (two) times daily.   Yes [provider]  Calcium Carbonate-Vitamin D (CALCIUM 600+D) 600-400 MG-UNIT tablet Take 1 tablet by mouth 2 (two) times daily.   Yes [provider]  divalproex (DEPAKOTE) 500 MG DR tablet Take 1 tablet (500 mg total) by mouth every 12 (twelve) hours for 30 days. Patient taking differently: Take 500 mg by  mouth See admin instructions. 500 AM and 1000 MG at night 04/27/19 03/06/20 Yes Amin, Ankit Chirag, MD  DULoxetine (CYMBALTA) 60 MG capsule Take 60 mg by mouth daily. For mood and pain   Yes [provider]  ferrous sulfate 325 (65 FE) MG tablet Take 325 mg by mouth every Monday, Wednesday, and Friday.   Yes [provider]  methocarbamol (ROBAXIN) 500 MG tablet Take 1,000 mg by mouth 2 (two) times daily.   Yes [provider]  Multiple Vitamin (MULTIVITAMIN WITH MINERALS) TABS tablet Take 1 tablet by mouth daily.   Yes [provider]  omeprazole (PRILOSEC) 40 MG capsule Take 40 mg by mouth 2 (two) times daily before a meal.   Yes [provider]  pregabalin (LYRICA) 300 MG capsule Take 300 mg by mouth at bedtime.   Yes [provider]  primidone (MYSOLINE) 50 MG tablet Take 50 mg by mouth See admin instructions. 50 MG in AM and 100 MG at night For tremors   Yes [provider]  propranolol ER (INDERAL LA) 60 MG 24 hr capsule Take 60 mg by mouth daily.   Yes [provider]  risperiDONE (RISPERDAL) 0.5 MG tablet Take 1 tablet (0.5 mg total) by mouth at bedtime as needed for up to 30 days (for agitation). Patient taking differently: Take 0.5 mg by mouth daily.  04/27/19 03/06/20 Yes Amin, Jeanella Flattery, MD  tamsulosin (FLOMAX) 0.4 MG CAPS capsule Take 0.4 mg by mouth at bedtime.    Yes [provider]  Tapentadol HCl (NUCYNTA) 100 MG TABS Take 150 mg by mouth 4 (four) times daily as needed (PAIN).    Yes [provider]  HYDROmorphone (DILAUDID) 2 MG tablet Take 1 tablet (2 mg total) by mouth 3 (three) times daily as needed for severe pain. Patient not taking: Reported on 08/08/2019 06/29/19   Leandrew Koyanagi, MD  promethazine (PHENERGAN) 25 MG tablet Take 1 tablet (25 mg total) by mouth every 6 (six) hours as needed for nausea. Patient not taking: Reported on 03/06/2020 06/29/19   Leandrew Koyanagi, MD  Tiotropium Bromide  Monohydrate (SPIRIVA RESPIMAT) 2.5 MCG/ACT AERS Inhale 2 puffs into the lungs daily.    [provider]    Allergies    Patient has no known allergies.  Review of Systems   Review of Systems  Unable to perform ROS: Mental status change    Physical Exam Updated Vital Signs BP (!) 142/82   Pulse 71   Temp 98.6 F (37 C) (Rectal)   Resp 19   SpO2 91%   Physical Exam Vitals reviewed.  HENT:     Head: Normocephalic.  Eyes:     Extraocular Movements: Extraocular movements intact.  Cardiovascular:     Rate and Rhythm: Regular rhythm.  Pulmonary:  Breath sounds: No wheezing, rhonchi or rales.  Abdominal:     Tenderness: There is no abdominal tenderness.  Musculoskeletal:        General: No deformity.     Cervical back: Neck supple.  Skin:    General: Skin is warm.  Neurological:     Mental Status: He is alert.     Comments: Somewhat tremulous.  Mild confusion.  Moves all extremities.     ED Results / Procedures / Treatments   Labs (all labs ordered are listed, but only abnormal results are displayed) Labs Reviewed  COMPREHENSIVE METABOLIC PANEL - Abnormal; Notable for the following components:      Result Value   Glucose, Bld 135 (*)    All other components within normal limits  CBC - Abnormal; Notable for the following components:   WBC 13.5 (*)    All other components within normal limits  VALPROIC ACID LEVEL - Abnormal; Notable for the following components:   Valproic Acid Lvl 37 (*)    All other components within normal limits  URINALYSIS, ROUTINE W REFLEX MICROSCOPIC - Abnormal; Notable for the following components:   Ketones, ur 5 (*)    All other components within normal limits  CBG MONITORING, ED - Abnormal; Notable for the following components:   Glucose-Capillary 121 (*)    All other components within normal limits  AMMONIA    EKG None  Radiology DG Chest Portable 1 View  Result Date: 03/06/2020 CLINICAL DATA:  69 year old male  with altered mental status, unresponsive. EXAM: PORTABLE CHEST 1 VIEW COMPARISON:  Chest CTA 04/22/2019 and earlier. FINDINGS: Portable AP semi upright view at 2154 hours. Rotated to the right. Chronic tortuosity of the thoracic aorta. Other mediastinal contours are within normal limits. Visualized tracheal air column is within normal limits. Stable lung volumes. Allowing for portable technique the lungs are clear. No pneumothorax or pleural effusion. No acute osseous abnormality identified. Negative visible bowel gas pattern. IMPRESSION: No acute cardiopulmonary abnormality. Electronically Signed   By: Odessa Fleming M.D.   On: 03/06/2020 22:11    Procedures Procedures (including critical care time)  Medications Ordered in ED Medications  sodium chloride flush (NS) 0.9 % injection 3 mL (has no administration in time range)    ED Course  I have reviewed the triage vital signs and the nursing notes.  Pertinent labs & imaging results that were available during my care of the patient were reviewed by me and considered in my medical decision making (see chart for details).    MDM Rules/Calculators/A&P                      Patient with altered mental status.  Potentially due to medicine.  Patient was some confusion.  Discussed with patient's wife later states that he had been so confused that he squatted and had a bowel movement in his truck.  More sleepy now.  Lab work reassuring.  Depakote level fine.  I think that now with more noticed from the wife will watch in the hospital. Final Clinical Impression(s) / ED Diagnoses Final diagnoses:  Tremor  Altered mental status, unspecified altered mental status type    Rx / DC Orders ED Discharge Orders    None       Benjiman Core, MD 03/07/20 0005

## 2020-03-06 NOTE — ED Triage Notes (Signed)
BIB EMS, Yesterday woke altered, ? Drug ingestion by accident by sister's opinion. Pt lethargic, some disorientation.  More confused this afternoon. 50mg  Benadryl IM by EMS 142/86-80-100% CBG 148

## 2020-03-07 ENCOUNTER — Inpatient Hospital Stay (HOSPITAL_COMMUNITY): Payer: No Typology Code available for payment source

## 2020-03-07 DIAGNOSIS — R824 Acetonuria: Secondary | ICD-10-CM | POA: Diagnosis present

## 2020-03-07 DIAGNOSIS — Z79899 Other long term (current) drug therapy: Secondary | ICD-10-CM

## 2020-03-07 DIAGNOSIS — R251 Tremor, unspecified: Secondary | ICD-10-CM | POA: Diagnosis present

## 2020-03-07 DIAGNOSIS — R6510 Systemic inflammatory response syndrome (SIRS) of non-infectious origin without acute organ dysfunction: Secondary | ICD-10-CM

## 2020-03-07 DIAGNOSIS — G9340 Encephalopathy, unspecified: Secondary | ICD-10-CM | POA: Diagnosis not present

## 2020-03-07 DIAGNOSIS — G8929 Other chronic pain: Secondary | ICD-10-CM | POA: Diagnosis present

## 2020-03-07 DIAGNOSIS — F419 Anxiety disorder, unspecified: Secondary | ICD-10-CM | POA: Diagnosis present

## 2020-03-07 DIAGNOSIS — Z8701 Personal history of pneumonia (recurrent): Secondary | ICD-10-CM | POA: Diagnosis not present

## 2020-03-07 DIAGNOSIS — M545 Low back pain: Secondary | ICD-10-CM | POA: Diagnosis present

## 2020-03-07 DIAGNOSIS — Z87891 Personal history of nicotine dependence: Secondary | ICD-10-CM | POA: Diagnosis not present

## 2020-03-07 DIAGNOSIS — M1990 Unspecified osteoarthritis, unspecified site: Secondary | ICD-10-CM | POA: Diagnosis present

## 2020-03-07 DIAGNOSIS — E869 Volume depletion, unspecified: Secondary | ICD-10-CM

## 2020-03-07 DIAGNOSIS — Z7982 Long term (current) use of aspirin: Secondary | ICD-10-CM | POA: Diagnosis not present

## 2020-03-07 DIAGNOSIS — K219 Gastro-esophageal reflux disease without esophagitis: Secondary | ICD-10-CM | POA: Diagnosis present

## 2020-03-07 DIAGNOSIS — F319 Bipolar disorder, unspecified: Secondary | ICD-10-CM | POA: Diagnosis present

## 2020-03-07 DIAGNOSIS — Z8673 Personal history of transient ischemic attack (TIA), and cerebral infarction without residual deficits: Secondary | ICD-10-CM | POA: Diagnosis not present

## 2020-03-07 DIAGNOSIS — Z833 Family history of diabetes mellitus: Secondary | ICD-10-CM | POA: Diagnosis not present

## 2020-03-07 DIAGNOSIS — Z20822 Contact with and (suspected) exposure to covid-19: Secondary | ICD-10-CM | POA: Diagnosis present

## 2020-03-07 DIAGNOSIS — Z8249 Family history of ischemic heart disease and other diseases of the circulatory system: Secondary | ICD-10-CM | POA: Diagnosis not present

## 2020-03-07 LAB — CBC WITH DIFFERENTIAL/PLATELET
Abs Immature Granulocytes: 0.17 10*3/uL — ABNORMAL HIGH (ref 0.00–0.07)
Basophils Absolute: 0.1 10*3/uL (ref 0.0–0.1)
Basophils Relative: 0 %
Eosinophils Absolute: 0 10*3/uL (ref 0.0–0.5)
Eosinophils Relative: 0 %
Immature Granulocytes: 1 %
Lymphocytes Relative: 19 %
Lymphs Abs: 2.3 10*3/uL (ref 0.7–4.0)
Monocytes Absolute: 1 10*3/uL (ref 0.1–1.0)
Monocytes Relative: 8 %
Neutro Abs: 9.5 10*3/uL — ABNORMAL HIGH (ref 1.7–7.7)
Neutrophils Relative %: 72 %
nRBC: 0 /100 WBC

## 2020-03-07 LAB — GLUCOSE, CAPILLARY: Glucose-Capillary: 111 mg/dL — ABNORMAL HIGH (ref 70–99)

## 2020-03-07 LAB — BASIC METABOLIC PANEL
Anion gap: 9 (ref 5–15)
BUN: 23 mg/dL (ref 8–23)
CO2: 23 mmol/L (ref 22–32)
Calcium: 8.3 mg/dL — ABNORMAL LOW (ref 8.9–10.3)
Chloride: 110 mmol/L (ref 98–111)
Creatinine, Ser: 0.93 mg/dL (ref 0.61–1.24)
GFR calc Af Amer: >60 mL/min (ref >60)
GFR calc non Af Amer: >60 mL/min (ref >60)
Glucose, Bld: 117 mg/dL — ABNORMAL HIGH (ref 70–99)
Potassium: 3.6 mmol/L (ref 3.5–5.1)
Sodium: 142 mmol/L (ref 135–145)

## 2020-03-07 LAB — HEPATIC FUNCTION PANEL
ALT: 13 U/L (ref 0–44)
AST: 43 U/L — ABNORMAL HIGH (ref 15–41)
Albumin: 3.2 g/dL — ABNORMAL LOW (ref 3.5–5.0)
Alkaline Phosphatase: 42 U/L (ref 38–126)
Bilirubin, Direct: 0.5 mg/dL — ABNORMAL HIGH (ref 0.0–0.2)
Indirect Bilirubin: 0.8 mg/dL (ref 0.3–0.9)
Total Bilirubin: 1.3 mg/dL — ABNORMAL HIGH (ref 0.3–1.2)
Total Protein: 6.5 g/dL (ref 6.5–8.1)

## 2020-03-07 LAB — CBC
HCT: 44.4 % (ref 39.0–52.0)
Hemoglobin: 14.2 g/dL (ref 13.0–17.0)
MCH: 30.6 pg (ref 26.0–34.0)
MCHC: 32 g/dL (ref 30.0–36.0)
MCV: 95.7 fL (ref 80.0–100.0)
Platelets: 203 10*3/uL (ref 150–400)
RBC: 4.64 MIL/uL (ref 4.22–5.81)
RDW: 14 % (ref 11.5–15.5)
WBC: 11.4 10*3/uL — ABNORMAL HIGH (ref 4.0–10.5)
nRBC: 0 % (ref 0.0–0.2)

## 2020-03-07 LAB — LACTIC ACID, PLASMA
Lactic Acid, Venous: 1.6 mmol/L (ref 0.5–1.9)
Lactic Acid, Venous: 1.3 mmol/L (ref 0.5–1.9)

## 2020-03-07 LAB — SARS CORONAVIRUS 2 (TAT 6-24 HRS): SARS Coronavirus 2: NEGATIVE

## 2020-03-07 LAB — CBG MONITORING, ED: Glucose-Capillary: 129 mg/dL — ABNORMAL HIGH (ref 70–99)

## 2020-03-07 LAB — PROCALCITONIN: Procalcitonin: <0.1 ng/mL

## 2020-03-07 LAB — MAGNESIUM: Magnesium: 2.1 mg/dL (ref 1.7–2.4)

## 2020-03-07 LAB — TSH: TSH: 0.121 u[IU]/mL — ABNORMAL LOW (ref 0.350–4.500)

## 2020-03-07 LAB — PHOSPHORUS: Phosphorus: 3.4 mg/dL (ref 2.5–4.6)

## 2020-03-07 MED ORDER — VANCOMYCIN HCL IN DEXTROSE 1-5 GM/200ML-% IV SOLN
1000.0000 mg | Freq: Two times a day (BID) | INTRAVENOUS | Status: DC
  Administered 2020-03-07: 1000 mg via INTRAVENOUS
  Filled 2020-03-07: qty 200

## 2020-03-07 MED ORDER — FERROUS SULFATE 325 (65 FE) MG PO TABS
325.0000 mg | ORAL_TABLET | ORAL | Status: DC
  Administered 2020-03-07: 10:00:00 325 mg via ORAL
  Filled 2020-03-07: qty 1

## 2020-03-07 MED ORDER — ALBUTEROL SULFATE (2.5 MG/3ML) 0.083% IN NEBU: 2.5000 mg | INHALATION_SOLUTION | RESPIRATORY_TRACT | Status: DC | PRN

## 2020-03-07 MED ORDER — ALBUTEROL SULFATE HFA 108 (90 BASE) MCG/ACT IN AERS
2.0000 | INHALATION_SPRAY | Freq: Four times a day (QID) | RESPIRATORY_TRACT | Status: DC | PRN
  Filled 2020-03-07: qty 6.7

## 2020-03-07 MED ORDER — SODIUM CHLORIDE 0.9 % IV SOLN: INTRAVENOUS | Status: DC

## 2020-03-07 MED ORDER — PROPRANOLOL HCL ER 60 MG PO CP24
60.0000 mg | ORAL_CAPSULE | Freq: Every day | ORAL | Status: DC
  Administered 2020-03-07: 60 mg via ORAL
  Filled 2020-03-07 (×2): qty 1

## 2020-03-07 MED ORDER — ENOXAPARIN SODIUM 40 MG/0.4ML ~~LOC~~ SOLN
40.0000 mg | SUBCUTANEOUS | Status: DC
  Administered 2020-03-07: 40 mg via SUBCUTANEOUS
  Filled 2020-03-07: qty 0.4

## 2020-03-07 MED ORDER — ASPIRIN EC 81 MG PO TBEC
81.0000 mg | DELAYED_RELEASE_TABLET | Freq: Every day | ORAL | Status: DC
  Administered 2020-03-07: 10:00:00 81 mg via ORAL
  Filled 2020-03-07: qty 1

## 2020-03-07 MED ORDER — VANCOMYCIN HCL IN DEXTROSE 1-5 GM/200ML-% IV SOLN
1000.0000 mg | Freq: Once | INTRAVENOUS | Status: AC
  Administered 2020-03-07: 1000 mg via INTRAVENOUS
  Filled 2020-03-07: qty 200

## 2020-03-07 MED ORDER — SODIUM CHLORIDE 0.9 % IV SOLN
2.0000 g | Freq: Three times a day (TID) | INTRAVENOUS | Status: DC
  Administered 2020-03-07: 08:00:00 2 g via INTRAVENOUS
  Filled 2020-03-07 (×4): qty 2

## 2020-03-07 MED ORDER — HYDROMORPHONE HCL 1 MG/ML IJ SOLN
1.0000 mg | Freq: Once | INTRAMUSCULAR | Status: AC
  Administered 2020-03-07: 06:00:00 1 mg via INTRAVENOUS
  Filled 2020-03-07: qty 1

## 2020-03-07 MED ORDER — TAMSULOSIN HCL 0.4 MG PO CAPS: 0.4000 mg | ORAL_CAPSULE | Freq: Every day | ORAL | Status: DC

## 2020-03-07 MED ORDER — CALCIUM CARBONATE-VITAMIN D 500-200 MG-UNIT PO TABS
1.0000 | ORAL_TABLET | Freq: Two times a day (BID) | ORAL | Status: DC
  Administered 2020-03-07: 1 via ORAL
  Filled 2020-03-07 (×2): qty 1

## 2020-03-07 MED ORDER — SODIUM CHLORIDE 0.9 % IV SOLN
2.0000 g | Freq: Once | INTRAVENOUS | Status: AC
  Administered 2020-03-07: 01:00:00 2 g via INTRAVENOUS
  Filled 2020-03-07: qty 2

## 2020-03-07 MED ORDER — UMECLIDINIUM BROMIDE 62.5 MCG/INH IN AEPB
1.0000 | INHALATION_SPRAY | Freq: Every day | RESPIRATORY_TRACT | Status: DC
  Filled 2020-03-07: qty 7

## 2020-03-07 NOTE — Progress Notes (Signed)
Pt has been requesting to have diet advanced since arriving to unit. Reports he has been NPO for past two days. MD has been paged 2x. Awaiting further instructions.

## 2020-03-07 NOTE — H&P (Signed)
History and Physical  Alee Katen CBS:[id number] DOB: 08/28/1951 DOA: 03/06/2020  Referring physician: ER provider PCP: Asencion Gowda.August Saucer, MD  Outpatient Specialists:    Patient coming from: Home  Chief Complaint: Altered mentation  HPI:  Patient is a 69 year old Caucasian male with past medical history significant for CVA, pneumonia, chronic back pain, bipolar, depression, anxiety, arthritis and polypharmacy.  Patient is on multiple mind altering medicatIon.  Patient is on buspirone, Depakote, Cymbalta, Robaxin, pregabalin, primidone, risperidone and tapentadol.  Patient is unable to give any history due to confusion.  Patient is also shivering, reports feeling cold, but denied fever or any other constitutional symptoms.  Specifically, patient denied headache, no neck pain, no URI symptoms, no chest pain, no shortness of breath, no GI symptoms and no urinary symptoms.  CBC reveals leukocytosis but no differentials visualized.  UA is nonrevealing.  ER provider has ordered CT head.  Hospitalist team has been asked to admit patient for further assessment and management.  ED Course: On presentation to the hospital, vitals revealed temperature of 99.7, blood pressure of 145/97, heart rate of 73, respiratory rate of 19 and O2 sat of 93%.  Pertinent labs reveals WBC of 13.5, otherwise, no other significant findings.  UA revealed specific gravity of 1.015, with negative leukocyte esterase and nitrite.  Urine ketones 5.  Chest x-ray has not revealed any acute abnormality.  CT head is pending.  Pertinent labs: As above.   Imaging: independently reviewed.   Review of Systems:  Negative for fever, visual changes, sore throat, rash, new muscle aches, chest pain, SOB, dysuria, bleeding, n/v/abdominal pain.  Past Medical History:  Diagnosis Date  . Anxiety   . Arthritis    "all through my body" (01/26/2014)  . Bipolar 1 disorder (HCC)   . Chronic lower back pain   . Depression   . Depression    . GERD (gastroesophageal reflux disease)   . Pneumonia 1952; 11/2013  . Prolapse, disk    lower back  . Stroke [facility])    pt reported TIA    Past Surgical History:  Procedure Laterality Date  . APPENDECTOMY  1961  . INGUINAL HERNIA REPAIR Left 1998  . SHOULDER ARTHROSCOPY WITH ROTATOR CUFF REPAIR AND SUBACROMIAL DECOMPRESSION Right 06/29/2019   Procedure: RIGHT SHOULDER ARTHROSCOPY WITH ROTATOR CUFF REPAIR, BICEPS TENO SUBACROMIAL DECOMPRESSION, DISTAL CLAVICLE EXCISION, EXTENSIVE DEBRIDEMENT;  Surgeon: Tarry Kos, MD;  Location: [facility];  Service: Orthopedics;  Laterality: Right;  . TONSILLECTOMY  1976     reports that he has quit smoking. His smoking use included cigarettes. He has a 25.00 pack-year smoking history. He has never used smokeless tobacco. He reports that he does not drink alcohol or use drugs.  No Known Allergies  Family History  Problem Relation Age of Onset  . Hypertension Mother   . Hypertension Father   . Aneurysm Father   . Other Sister        killed  . Diabetes Sister   . Heart disease Sister      Prior to Admission medications   Medication Sig Start Date End Date Taking? Authorizing Provider  albuterol (VENTOLIN HFA) 108 (90 Base) MCG/ACT inhaler Inhale 2 puffs into the lungs 4 (four) times daily as needed for wheezing or shortness of breath.    Yes [provider]  aspirin EC 81 MG tablet Take 81 mg by mouth daily.   Yes [provider]  atorvastatin (LIPITOR) 20 MG tablet Take 20 mg by mouth  at bedtime.   Yes [provider]  busPIRone (BUSPAR) 15 MG tablet Take 15 mg by mouth 2 (two) times daily.   Yes [provider]  Calcium Carbonate-Vitamin D (CALCIUM 600+D) 600-400 MG-UNIT tablet Take 1 tablet by mouth 2 (two) times daily.   Yes [provider]  divalproex (DEPAKOTE) 500 MG DR tablet Take 1 tablet (500 mg total) by mouth every 12 (twelve) hours for 30 days. Patient taking  differently: Take 500 mg by mouth See admin instructions. 500 AM and 1000 MG at night 04/27/19 03/06/20 Yes Amin, Ankit Chirag, MD  DULoxetine (CYMBALTA) 60 MG capsule Take 60 mg by mouth daily. For mood and pain   Yes [provider]  ferrous sulfate 325 (65 FE) MG tablet Take 325 mg by mouth every Monday, Wednesday, and Friday.   Yes [provider]  methocarbamol (ROBAXIN) 500 MG tablet Take 1,000 mg by mouth 2 (two) times daily.   Yes [provider]  Multiple Vitamin (MULTIVITAMIN WITH MINERALS) TABS tablet Take 1 tablet by mouth daily.   Yes [provider]  omeprazole (PRILOSEC) 40 MG capsule Take 40 mg by mouth 2 (two) times daily before a meal.   Yes [provider]  pregabalin (LYRICA) 300 MG capsule Take 300 mg by mouth at bedtime.   Yes [provider]  primidone (MYSOLINE) 50 MG tablet Take 50 mg by mouth See admin instructions. 50 MG in AM and 100 MG at night For tremors   Yes [provider]  propranolol ER (INDERAL LA) 60 MG 24 hr capsule Take 60 mg by mouth daily.   Yes [provider]  risperiDONE (RISPERDAL) 0.5 MG tablet Take 1 tablet (0.5 mg total) by mouth at bedtime as needed for up to 30 days (for agitation). Patient taking differently: Take 0.5 mg by mouth daily.  04/27/19 03/06/20 Yes Amin, Loura Halt, MD  tamsulosin (FLOMAX) 0.4 MG CAPS capsule Take 0.4 mg by mouth at bedtime.    Yes [provider]  Tapentadol HCl (NUCYNTA) 100 MG TABS Take 150 mg by mouth 4 (four) times daily as needed (PAIN).    Yes [provider]  HYDROmorphone (DILAUDID) 2 MG tablet Take 1 tablet (2 mg total) by mouth 3 (three) times daily as needed for severe pain. Patient not taking: Reported on 08/08/2019 06/29/19   Tarry Kos, MD  promethazine (PHENERGAN) 25 MG tablet Take 1 tablet (25 mg total) by mouth every 6 (six) hours as needed for nausea. Patient not taking: Reported on 03/06/2020 06/29/19   Tarry Kos, MD  Tiotropium Bromide Monohydrate (SPIRIVA RESPIMAT) 2.5 MCG/ACT AERS Inhale 2 puffs into the lungs daily.    [provider]    Physical Exam: Vitals:   03/06/20 2230 03/06/20 2300 03/06/20 2330 03/07/20 0000  BP: 133/81 132/89 (!) 142/82 (!) 145/97  Pulse: 82 82 71 73  Resp: 20 (!) 21 19 19   Temp:      TempSrc:      SpO2: 94% 94% 91% 93%    Constitutional:  . Patient is shaky (query rigor)  Eyes:  . No pallor. No jaundice.  ENMT:  . external ears, nose appear normal.  Dry buccal mucosa. Neck:  . Neck is supple. No JVD Respiratory:  . CTA bilaterally, no w/r/r.  . Respiratory effort normal. No retractions or accessory muscle use Cardiovascular:  . S1S2 . No LE extremity edema   Abdomen:  . Abdomen is soft and non tender.  Organs are difficult to assess. Neurologic:  . Awake and alert. . Moves all limbs.  Wt Readings from Last 3 Encounters:  06/29/19 96 kg  04/27/19 93.6 kg  09/21/18 104.3 kg    I have personally reviewed following labs and imaging studies  Labs on Admission:  CBC: Recent Labs  Lab 67/89/38 1017  WBC DUPLICATE  51.0*  NEUTROABS PENDING  HGB DUPLICATE  25.8  HCT DUPLICATE  52.7  MCV DUPLICATE  78.2  PLT DUPLICATE  423   Basic Metabolic Panel: Recent Labs  Lab 03/06/20 2045  NA 143  K 3.9  CL 105  CO2 27  GLUCOSE 135*  BUN 22  CREATININE 1.17  CALCIUM 9.4   Liver Function Tests: Recent Labs  Lab 03/06/20 2045  AST 22  ALT 13  ALKPHOS 56  BILITOT 0.7  PROT 8.0  ALBUMIN 4.0   No results for input(s): LIPASE, AMYLASE in the last 168 hours. Recent Labs  Lab 03/06/20 2045  AMMONIA 18   Coagulation Profile: No results for input(s): INR, PROTIME in the last 168 hours. Cardiac Enzymes: No results for input(s): CKTOTAL, CKMB, CKMBINDEX, TROPONINI in the last 168 hours. BNP (last 3 results) No results for input(s): PROBNP in the last 8760 hours. HbA1C: No results for input(s): HGBA1C in the last 72  hours. CBG: Recent Labs  Lab 03/06/20 2038  GLUCAP 121*   Lipid Profile: No results for input(s): CHOL, HDL, LDLCALC, TRIG, CHOLHDL, LDLDIRECT in the last 72 hours. Thyroid Function Tests: No results for input(s): TSH, T4TOTAL, FREET4, T3FREE, THYROIDAB in the last 72 hours. Anemia Panel: No results for input(s): VITAMINB12, FOLATE, FERRITIN, TIBC, IRON, RETICCTPCT in the last 72 hours. Urine analysis:    Component Value Date/Time   COLORURINE YELLOW 03/06/2020 2051   APPEARANCEUR CLEAR 03/06/2020 2051   LABSPEC 1.015 03/06/2020 2051   PHURINE 7.0 03/06/2020 2051   GLUCOSEU NEGATIVE 03/06/2020 2051   HGBUR NEGATIVE 03/06/2020 2051   [place] NEGATIVE 03/06/2020 2051   KETONESUR 5 (A) 03/06/2020 2051   PROTEINUR NEGATIVE 03/06/2020 2051   UROBILINOGEN 0.2 01/23/2014 1505   NITRITE NEGATIVE 03/06/2020 2051   LEUKOCYTESUR NEGATIVE 03/06/2020 2051   Sepsis Labs: @LABRCNTIP (procalcitonin:4,lacticidven:4) )No results found for this or any previous visit (from the past 240 hour(s)).    Radiological Exams on Admission: DG Chest Portable 1 View  Result Date: 03/06/2020 CLINICAL DATA:  69 year old male with altered mental status, unresponsive. EXAM: PORTABLE CHEST 1 VIEW COMPARISON:  Chest CTA 04/22/2019 and earlier. FINDINGS: Portable AP semi upright view at 2154 hours. Rotated to the right. Chronic tortuosity of the thoracic aorta. Other mediastinal contours are within normal limits. Visualized tracheal air column is within normal limits. Stable lung volumes. Allowing for portable technique the lungs are clear. No pneumothorax or pleural effusion. No acute osseous abnormality identified. Negative visible bowel gas pattern. IMPRESSION: No acute cardiopulmonary abnormality. Electronically Signed   By: Genevie Ann M.D.   On: 03/06/2020 22:11    Active Problems:   Acute encephalopathy   Assessment/Plan Acute encephalopathy/SIRS: -Patient reports feeling cold and shaky (this could be  chills and rigors however, patient denies fever)  -Leukocytosis of 13,500 is noted.  No differentials visualized -We will admit patient for further assessment and management. -We will panculture patient -Chest x-ray is nonrevealing, but patient is volume depleted -Follow results of CT head as patient has history of stroke -We will check procalcitonin -We will repeat CBC with differential -Low threshold to start patient on Vanco and  cefepime, but will de-escalate antibiotics based on course of illness and clinical symptoms and signs.  Volume depletion: Hydrate patient  Polypharmacy: -This could also account for the altered mentation, chills and shakes. -Hopefully, this is not a form of serotonin syndrome versus opioid-induced neurotoxicity -Hold BuSpar on, Depakote, Cymbalta, Robaxin, pregabalin, primidone, risperidone and tapentadol -Further management depend on hospital course.  DVT prophylaxis: Subacute Lovenox if CT head is nonrevealing Code Status: Full code Family Communication:  Disposition Plan: This will depend on hospital course Consults called: None.   Admission status: Inpatient  Time spent: 65 minutes  Berton Mount, MD  [facility] Pager #: [phone] 7PM-7AM contact night coverage as above  03/07/2020, 12:57 AM

## 2020-03-07 NOTE — Progress Notes (Signed)
Occupational Therapy Evaluation  Clinical Impression PTA pt reports living at home with spouse and performing all self-care tasks with Modified Independence. Pt mobilizes with SPC at home. Pt presents with decreased awareness of deficits and high fall risk. Pt is alert and oriented to self and place. Pt was not able to stabilize enough to complete toileting pericare at Max A level using grab bar. Pt was able to mobilize with RW at Minimal A level for steadying with noted B UE tremors, Pt will benefit from continued skilled acute OT services.     03/07/20 1552  OT Visit Information  Assistance Needed +1  PT/OT/SLP Co-Evaluation/Treatment Yes  Reason for Co-Treatment Necessary to address cognition/behavior during functional activity;For patient/therapist safety;To address functional/ADL transfers  PT goals addressed during session Mobility/safety with mobility;Balance;Proper use of DME  OT goals addressed during session ADL's and self-care  History of Present Illness Patient is a 69 year old Caucasian male with past medical history significant for CVA, pneumonia, chronic back pain, bipolar, depression, anxiety, arthritis and polypharmacy.  Patient is on multiple mind altering medicatIon. patient  admitted 03/06/20 for altered mental status  Precautions  Precautions Fall  Restrictions  Weight Bearing Restrictions No  Home Living  Family/patient expects to be discharged to: Private residence  Living Arrangements Spouse/significant other  Available Help at Discharge Family  Type of [place] to enter  Entrance Stairs-Number of Steps 2  [place] One level  Bathroom Biomedical scientist Yes  How Accessible Accessible via walker  [place] - single point  Prior Function  Level of [place] with assistive device(s)  Comments uses cane  Communication  Communication Expressive difficulties   Pain Assessment  Pain Assessment No/denies pain  Cognition  Arousal/Alertness Awake/alert  Behavior During Therapy Restless;Anxious;Impulsive  Overall Cognitive Status No family/caregiver present to determine baseline cognitive functioning  Area of Impairment Orientation;Attention;Memory;Following commands  Orientation Level Time;Situation  Current Attention Level Focused  Memory Decreased recall of precautions;Decreased short-term memory  Following Commands Follows one step commands inconsistently  Upper Extremity Assessment  Upper Extremity Assessment LUE deficits/detail;RUE deficits/detail  RUE  (Grossly 4-/5, AROM WFL )  RUE Coordination decreased gross motor (tremors)  LUE  (Grossly 4-/5, AROM WFL )  LUE Coordination decreased gross motor (tremors)  Lower Extremity Assessment  Lower Extremity Assessment Defer to PT evaluation  ADL  Overall ADL's  Needs assistance/impaired  Eating/Feeding Set up  Grooming Set up  Upper Body Bathing Set up  Lower Body Bathing Moderate assistance  Upper Body Dressing  Set up  Lower Body Dressing Moderate assistance  Toilet Transfer Grab bars;Moderate assistance  Toileting- Clothing Manipulation and Hygiene Maximal assistance  Tub/ Shower Transfer Moderate assistance  Functional mobility during ADLs Min guard;Rolling walker  Vision- History  Baseline Vision/History No visual deficits  Bed Mobility  General bed mobility comments patient sitting on toilet upon arrival in room   Transfers  Equipment used Rolling walker (2 wheeled)  Transfers Sit to/from Stand  Sit to [employer] assist  General transfer comment steady assistance from bed, cues for safety to reach back to  recliner  Balance  Overall balance assessment Needs assistance  Sitting balance-Leahy Scale Fair  Standing balance-Leahy Scale Poor  OT - End of Session  Equipment Utilized During Treatment Rolling walker;Gait belt  Activity Tolerance Patient tolerated treatment well   Patient left in chair;with call bell/phone within reach  OT Assessment  OT Recommendation/Assessment Patient needs  continued OT Services  OT Visit Diagnosis Unsteadiness on feet (R26.81);Muscle weakness (generalized) (M62.81)  OT Problem List Decreased strength;Decreased activity tolerance;Impaired balance (sitting and/or standing);Decreased coordination;Decreased knowledge of use of DME or AE;Decreased safety awareness  OT Plan  OT Frequency (ACUTE ONLY) Min 2X/week  AM-PAC OT "6 Clicks" Daily Activity Outcome Measure (Version 2)  Help from another person eating meals? 3  Help from another person taking care of personal grooming? 3  Help from another person toileting, which includes using toliet, bedpan, or urinal? 2  Help from another person bathing (including washing, rinsing, drying)? 2  Help from another person to put on and taking off regular upper body clothing? 3  Help from another person to put on and taking off regular lower body clothing? 2  6 Click Score 15  OT Recommendation  Follow Up Recommendations Home health OT  OT Equipment 3 in 1 bedside commode  Individuals Consulted  Consulted and Agree with Results and Recommendations Patient  Acute Rehab OT Goals  Patient Stated Goal to go home, start boxing again  OT Goal Formulation With patient  Time For Goal Achievement 03/21/20  Potential to Achieve Goals Fair  OT Time Calculation  OT Start Time (ACUTE ONLY) 1110  OT Stop Time (ACUTE ONLY) 1136  OT Time Calculation (min) 26 min  OT General Charges  $OT Visit 1 Visit  OT Evaluation  $OT Eval Moderate Complexity 1 Mod  Written Expression  Dominant Hand Right   Asya Derryberry OTR/L

## 2020-03-07 NOTE — Evaluation (Signed)
Physical Therapy Evaluation Patient Details Name: Alan Brown MRN: [id number] DOB: November 28, 1951 Today's Date: 03/07/2020   History of Present Illness  Patient is a 69 year old Caucasian male with past medical history significant for CVA, pneumonia, chronic back pain, bipolar, depression, anxiety, arthritis and polypharmacy.  Patient is on multiple mind altering medicatIon. patient  admitted 03/06/20 for altered mental status  Clinical Impression  The patient presents with decreased safety awareness, imbalanced ambulation requiring a Rw and min guard. No family present to determine MS at baseline. The patient had difficulty punching in home phone number with repeated instructions.  Tremors of hands noted- patient states they are worse.  Patient currently requires assistance for ambulation safety. Pt admitted with above diagnosis.  Pt currently with functional limitations due to the deficits listed below (see PT Problem List). Pt will benefit from skilled PT to increase their independence and safety with mobility to allow discharge to the venue listed below.       Follow Up Recommendations Home health PT;Supervision/Assistance - 24 hour    Equipment Recommendations  Rolling walker with 5" wheels    Recommendations for Other Services       Precautions / Restrictions Precautions Precautions: Fall      Mobility  Bed Mobility               General bed mobility comments: in bathroom  Transfers Overall transfer level: Needs assistance Equipment used: Rolling walker (2 wheeled) Transfers: Sit to/from Stand Sit to Stand: Min assist         General transfer comment: steady assistance from bed, cues for safety to reach back to  recliner  Ambulation/Gait Ambulation/Gait assistance: Min assist Gait Distance (Feet): 200 Feet Assistive device: Rolling walker (2 wheeled) Gait Pattern/deviations: Decreased step length - right;Decreased step length - left;Drifts right/left      General Gait Details: patient keeps head and UE rigid, does not look around, gait is guarded. When encouraged to look to side, decreased balance noticed.  Stairs            Wheelchair Mobility    Modified Rankin (Stroke Patients Only)       Balance Overall balance assessment: Needs assistance Sitting-balance support: No upper extremity supported;Feet supported Sitting balance-Leahy Scale: Fair     Standing balance support: Bilateral upper extremity supported;During functional activity Standing balance-Leahy Scale: Poor Standing balance comment: noted tremors of the trunk when standing,                             Pertinent Vitals/Pain Pain Assessment: No/denies pain    Home Living Family/patient expects to be discharged to:: Private residence Living Arrangements: Spouse/significant other Available Help at Discharge: Family Type of Home: House Home Access: Stairs to enter   Technical brewer of Steps: 2-3 Home Layout: One level Home Equipment: Cane - single point      Prior Function Level of Independence: Independent with assistive device(s)         Comments: uses cane     Hand Dominance   Dominant Hand: Right    Extremity/Trunk Assessment   Upper Extremity Assessment Upper Extremity Assessment: (t)    Lower Extremity Assessment Lower Extremity Assessment: Generalized weakness    Cervical / Trunk Assessment Cervical / Trunk Assessment: Normal  Communication   Communication: Expressive difficulties  Cognition Arousal/Alertness: Awake/alert Behavior During Therapy: Restless;Anxious;Impulsive Overall Cognitive Status: No family/caregiver present to determine baseline cognitive functioning Area of Impairment: Orientation;Attention;Memory;Following commands  Orientation Level: Time;Situation Current Attention Level: Focused Memory: Decreased recall of precautions;Decreased short-term memory Following  Commands: Follows one step commands inconsistently       General Comments: quite vocal and angry when he could not make a call on phoneand  then when he got through, wife's phone mesage box  was full      General Comments      Exercises     Assessment/Plan    PT Assessment Patient needs continued PT services  PT Problem List Decreased balance;Decreased cognition;Decreased knowledge of precautions;Decreased knowledge of use of DME;Decreased activity tolerance;Decreased safety awareness;Decreased coordination;Decreased mobility       PT Treatment Interventions DME instruction;Functional mobility training;Balance training;Patient/family education;Gait training;Therapeutic activities;Therapeutic exercise;Cognitive remediation;Stair training    PT Goals (Current goals can be found in the Care Plan section)  Acute Rehab PT Goals Patient Stated Goal: to go home, start boxing again PT Goal Formulation: With patient Time For Goal Achievement: 03/21/20 Potential to Achieve Goals: Fair    Frequency Min 3X/week   Barriers to discharge        Co-evaluation               AM-PAC PT "6 Clicks" Mobility  Outcome Measure Help needed turning from your back to your side while in a flat bed without using bedrails?: A Little Help needed moving from lying on your back to sitting on the side of a flat bed without using bedrails?: A Little Help needed moving to and from a bed to a chair (including a wheelchair)?: A Little Help needed standing up from a chair using your arms (e.g., wheelchair or bedside chair)?: A Lot Help needed to walk in hospital room?: A Lot Help needed climbing 3-5 steps with a railing? : A Lot 6 Click Score: 15    End of Session Equipment Utilized During Treatment: Gait belt Activity Tolerance: Patient tolerated treatment well Patient left: in chair;with call bell/phone within reach;with chair alarm set Nurse Communication: Mobility status PT Visit Diagnosis:  Unsteadiness on feet (R26.81);Difficulty in walking, not elsewhere classified (R26.2)    Time: 1105-1130 PT Time Calculation (min) (ACUTE ONLY): 25 min   Charges:   PT Evaluation $PT Eval Low Complexity: 1 Low          Blanchard Kelch PT Acute Rehabilitation Services Pager [phone] Office [phone]   Rada Hay 03/07/2020, 2:04 PM

## 2020-03-07 NOTE — Progress Notes (Signed)
Per MD, pt requesting to go home against medical advice. Pt appears to be more coherent, a&o x3. Second RN Onalee Hua, RN) agreed pt is stable for d/c.   RN called pt's spouse [name]), spouse agreeable with plan to discharge pt home today.

## 2020-03-07 NOTE — Progress Notes (Signed)
Pharmacy Antibiotic Note  Alan Brown is a 69 y.o. male admitted on 03/06/2020 with SIRS.  Pharmacy has been consulted for Vancomycin, cefepime dosing.  Plan: Vancomycin 1gm iv x1, then Vancomycin 1000 mg IV Q 12 hrs. Goal AUC 400-550. Expected AUC: 456 (Vd=0.5) or 528 (Vd=0.7) SCr used: 0.93  Cefepime 2gm iv x1, then 2gm iv q8hr  Height: 5\' 10"  (177.8 cm) Weight: 211 lb (95.7 kg) IBW/kg (Calculated) : 73  Temp (24hrs), Avg:99.2 F (37.3 C), Min:98.6 F (37 C), Max:99.7 F (37.6 C)  Recent Labs  Lab 03/06/20 2045 03/07/20 0246 03/07/20 0500  WBC DUPLICATE  13.5*  --  11.4*  CREATININE 1.17  --  0.93  LATICACIDVEN  --  1.6 1.3    Estimated Creatinine Clearance: 87.1 mL/min (by C-G formula based on SCr of 0.93 mg/dL).    No Known Allergies  Antimicrobials this admission: Vancomycin 03/07/2020 >> Cefepime 03/07/2020 >>   Dose adjustments this admission: -  Microbiology results: -  Thank you for allowing pharmacy to be a part of this patient's care.  03/09/2020 [place] 03/07/2020 6:37 AM

## 2020-03-07 NOTE — TOC Progression Note (Signed)
Transition of Care [facility]) - Progression Note    Patient Details  Name: Breydon Senters MRN: [id number] Date of Birth: 1951/05/18  [facility] CM/SW Contact  Lennart Pall, LCSW Phone Number: 03/07/2020, 4:08 PM  Clinical Narrative:   Met with pt to review potential dc needs, however, pt declines recommendation for walker or follow up therapy.  He reports, "I just cussed out the doctor and he told me to get out."  States that his wife is coming to pick him up after work and appreciates offer of assistance but nothing needed.  Confirmed with nursing that MD has said for pt to be d/c'd AMA.  No further needs.         Expected Discharge Plan and Services                                                 Social Determinants of Health (SDOH) Interventions    Readmission Risk Interventions No flowsheet data found.

## 2020-03-08 NOTE — Discharge Summary (Signed)
Physician Discharge Summary  Alan Brown JKD:[id number] DOB: 03-01-1951 DOA: 03/06/2020  PCP: Clovis Riley, L.August Saucer, MD  Admit date: 03/06/2020 Discharge date: 03/08/2020        LEFT AMA Admitted From: home Disposition:  Home  Recommendations for Outpatient Follow-up:  1. None  Home Health:no Equipment/Devices:none  Discharge Condition:stable CODE STATUS:full Diet recommendation: Heart Healthy   Brief/Interim Summary: 69 y.o. male past medical history of CVA, pneumonia, chronic back pain, bipolar disorder and polypharmacy use comes into the hospital confused shivering and reporting it is cold.  He was unable to give a complete history due to his confusion.  Discharge Diagnoses:  Active Problems:   Acute encephalopathy He was afebrile with no leukocytosis, patient did not provide a UDS chest x-ray was unrevealing CT scan of the head showed no acute findings, his procalcitonin was low yield TSH was 1.2. Culture data has remained negative to date. The patient is a multiple medication like Robaxin, BuSpar, Depakote, Cymbalta, Lyrica, primidone and risperidone which can make him sleepy and confused. He could also been inebriated. When I went to the room the patient became belligerent and swearing, he refuses physical examination. After insulting me said he was going to leave AGAINST MEDICAL ADVICE.   Discharge Instructions   Allergies as of 03/07/2020   No Known Allergies     Medication List    ASK your doctor about these medications   albuterol 108 (90 Base) MCG/ACT inhaler Commonly known as: VENTOLIN HFA Inhale 2 puffs into the lungs 4 (four) times daily as needed for wheezing or shortness of breath.   aspirin EC 81 MG tablet Take 81 mg by mouth daily.   atorvastatin 20 MG tablet Commonly known as: LIPITOR Take 20 mg by mouth at bedtime.   busPIRone 15 MG tablet Commonly known as: BUSPAR Take 15 mg by mouth 2 (two) times daily.   Calcium 600+D 600-400  MG-UNIT tablet Generic drug: Calcium Carbonate-Vitamin D Take 1 tablet by mouth 2 (two) times daily.   divalproex 500 MG DR tablet Commonly known as: DEPAKOTE Take 1 tablet (500 mg total) by mouth every 12 (twelve) hours for 30 days.   DULoxetine 60 MG capsule Commonly known as: CYMBALTA Take 60 mg by mouth daily. For mood and pain   ferrous sulfate 325 (65 FE) MG tablet Take 325 mg by mouth every Monday, Wednesday, and Friday.   HYDROmorphone 2 MG tablet Commonly known as: Dilaudid Take 1 tablet (2 mg total) by mouth 3 (three) times daily as needed for severe pain.   methocarbamol 500 MG tablet Commonly known as: ROBAXIN Take 1,000 mg by mouth 2 (two) times daily.   multivitamin with minerals Tabs tablet Take 1 tablet by mouth daily.   Nucynta 100 MG Tabs Generic drug: Tapentadol HCl Take 150 mg by mouth 4 (four) times daily as needed (PAIN).   omeprazole 40 MG capsule Commonly known as: PRILOSEC Take 40 mg by mouth 2 (two) times daily before a meal.   pregabalin 300 MG capsule Commonly known as: LYRICA Take 300 mg by mouth at bedtime.   primidone 50 MG tablet Commonly known as: MYSOLINE Take 50 mg by mouth See admin instructions. 50 MG in AM and 100 MG at night For tremors   promethazine 25 MG tablet Commonly known as: PHENERGAN Take 1 tablet (25 mg total) by mouth every 6 (six) hours as needed for nausea.   propranolol ER 60 MG 24 hr capsule Commonly known as: INDERAL LA Take 60 mg by  mouth daily.   risperiDONE 0.5 MG tablet Commonly known as: RISPERDAL Take 1 tablet (0.5 mg total) by mouth at bedtime as needed for up to 30 days (for agitation).   Spiriva Respimat 2.5 MCG/ACT Aers Generic drug: Tiotropium Bromide Monohydrate Inhale 2 puffs into the lungs daily.   tamsulosin 0.4 MG Caps capsule Commonly known as: FLOMAX Take 0.4 mg by mouth at bedtime.      Follow-up Information    Schedule an appointment as soon as possible for a visit  with  Clovis Riley, L.August Saucer, MD.   Specialty: Family Medicine Contact information: [address] [place] [address] [phone]          No Known Allergies  Consultations:  none   Procedures/Studies: CT Head Wo Contrast  Result Date: 03/07/2020 CLINICAL DATA:  Altered mental status EXAM: CT HEAD WITHOUT CONTRAST TECHNIQUE: Contiguous axial images were obtained from the base of the skull through the vertex without intravenous contrast. COMPARISON:  04/22/2019 FINDINGS: Brain: No acute intracranial abnormality. Specifically, no hemorrhage, hydrocephalus, mass lesion, acute infarction, or significant intracranial injury. Vascular: No hyperdense vessel or unexpected calcification. Skull: No acute calvarial abnormality. Sinuses/Orbits: Visualized paranasal sinuses and mastoids clear. Orbital soft tissues unremarkable. Other: None IMPRESSION: No acute intracranial abnormality. Electronically Signed   By: Charlett Nose M.D.   On: 03/07/2020 02:18   DG Chest Portable 1 View  Result Date: 03/06/2020 CLINICAL DATA:  70 year old male with altered mental status, unresponsive. EXAM: PORTABLE CHEST 1 VIEW COMPARISON:  Chest CTA 04/22/2019 and earlier. FINDINGS: Portable AP semi upright view at 2154 hours. Rotated to the right. Chronic tortuosity of the thoracic aorta. Other mediastinal contours are within normal limits. Visualized tracheal air column is within normal limits. Stable lung volumes. Allowing for portable technique the lungs are clear. No pneumothorax or pleural effusion. No acute osseous abnormality identified. Negative visible bowel gas pattern. IMPRESSION: No acute cardiopulmonary abnormality. Electronically Signed   By: Odessa Fleming M.D.   On: 03/06/2020 22:11     Subjective: No complains  Discharge Exam: Vitals:   03/07/20 0800 03/07/20 0846  BP: (!) 144/86 (!) 150/101  Pulse: 68 79  Resp: 18 (!) 22  Temp: 98.1 F (36.7 C) 97.9 F (36.6 C)  SpO2: 92% 94%   Vitals:    03/07/20 0700 03/07/20 0730 03/07/20 0800 03/07/20 0846  BP: (!) 161/87 (!) 152/83 (!) 144/86 (!) 150/101  Pulse: 70 70 68 79  Resp: 14 19 18  (!) 22  Temp:   98.1 F (36.7 C) 97.9 F (36.6 C)  TempSrc:   Oral   SpO2: 92% 91% 92% 94%  Weight:      Height:        General: Pt is alert, awake, not in acute distress Cardiovascular: RRR, S1/S2 +, no rubs, no gallops Respiratory: CTA bilaterally, no wheezing, no rhonchi Abdominal: Soft, NT, ND, bowel sounds + Extremities: no edema, no cyanosis    The results of significant diagnostics from this hospitalization (including imaging, microbiology, ancillary and laboratory) are listed below for reference.     Microbiology: Recent Results (from the past 240 hour(s))  SARS CORONAVIRUS 2 (TAT 6-24 HRS) Nasopharyngeal Nasopharyngeal Swab     Status: None   Collection Time: 03/07/20  5:10 AM   Specimen: Nasopharyngeal Swab  Result Value Ref Range Status   SARS Coronavirus 2 NEGATIVE NEGATIVE Final    Comment: (NOTE) SARS-CoV-2 target nucleic acids are NOT DETECTED. The SARS-CoV-2 RNA is generally detectable in upper and  lower respiratory specimens during the acute phase of infection. Negative results do not preclude SARS-CoV-2 infection, do not rule out co-infections with other pathogens, and should not be used as the sole basis for treatment or other patient management decisions. Negative results must be combined with clinical observations, patient history, and epidemiological information. The expected result is Negative. Fact Sheet for Patients: SugarRoll.be Fact Sheet for Healthcare Providers: https://www.woods-mathews.com/ This test is not yet approved or cleared by the [place] FDA and  has been authorized for detection and/or diagnosis of SARS-CoV-2 by FDA under an Emergency Use Authorization (EUA). This EUA will remain  in effect (meaning this test can be used) for the duration of  the COVID-19 declaration under Section 56 4(b)(1) of the Act, 21 U.S.C. section 360bbb-3(b)(1), unless the authorization is terminated or revoked sooner. Performed at [facility], [address] [place], [place] [address]      Labs: BNP (last 3 results) Recent Labs    04/23/19 0103  BNP 71.6   Basic Metabolic Panel: Recent Labs  Lab 03/06/20 2045 03/07/20 0246 03/07/20 0500  NA 143  --  142  K 3.9  --  3.6  CL 105  --  110  CO2 27  --  23  GLUCOSE 135*  --  117*  BUN 22  --  23  CREATININE 1.17  --  0.93  CALCIUM 9.4  --  8.3*  MG  --  2.1  --   PHOS  --  3.4  --    Liver Function Tests: Recent Labs  Lab 03/06/20 2045 03/07/20 0246  AST 22 43*  ALT 13 13  ALKPHOS 56 42  BILITOT 0.7 1.3*  PROT 8.0 6.5  ALBUMIN 4.0 3.2*   No results for input(s): LIPASE, AMYLASE in the last 168 hours. Recent Labs  Lab 03/06/20 2045  AMMONIA 18   CBC: Recent Labs  Lab 03/06/20 2045 96/78/93 8101  WBC DUPLICATE  75.1* 02.5*  NEUTROABS 9.5*  --   HGB DUPLICATE  85.2 77.8  HCT DUPLICATE  24.2 35.3  MCV DUPLICATE  61.4 43.1  PLT DUPLICATE  540 086   Cardiac Enzymes: No results for input(s): CKTOTAL, CKMB, CKMBINDEX, TROPONINI in the last 168 hours. BNP: Invalid input(s): POCBNP CBG: Recent Labs  Lab 03/06/20 2038 03/07/20 0617 03/07/20 1219  GLUCAP 121* 129* 111*   D-Dimer No results for input(s): DDIMER in the last 72 hours. Hgb A1c No results for input(s): HGBA1C in the last 72 hours. Lipid Profile No results for input(s): CHOL, HDL, LDLCALC, TRIG, CHOLHDL, LDLDIRECT in the last 72 hours. Thyroid function studies Recent Labs    03/07/20 0246  TSH 0.121*   Anemia work up No results for input(s): VITAMINB12, FOLATE, FERRITIN, TIBC, IRON, RETICCTPCT in the last 72 hours. Urinalysis    Component Value Date/Time   COLORURINE YELLOW 03/06/2020 2051   APPEARANCEUR CLEAR 03/06/2020 2051   LABSPEC 1.015 03/06/2020 2051   PHURINE 7.0  03/06/2020 2051   GLUCOSEU NEGATIVE 03/06/2020 2051   HGBUR NEGATIVE 03/06/2020 2051   BILIRUBINUR NEGATIVE 03/06/2020 2051   KETONESUR 5 (A) 03/06/2020 2051   PROTEINUR NEGATIVE 03/06/2020 2051   UROBILINOGEN 0.2 01/23/2014 1505   NITRITE NEGATIVE 03/06/2020 2051   LEUKOCYTESUR NEGATIVE 03/06/2020 2051   Sepsis Labs Invalid input(s): PROCALCITONIN,  WBC,  LACTICIDVEN Microbiology Recent Results (from the past 240 hour(s))  SARS CORONAVIRUS 2 (TAT 6-24 HRS) Nasopharyngeal Nasopharyngeal Swab     Status: None   Collection  Time: 03/07/20  5:10 AM   Specimen: Nasopharyngeal Swab  Result Value Ref Range Status   SARS Coronavirus 2 NEGATIVE NEGATIVE Final    Comment: (NOTE) SARS-CoV-2 target nucleic acids are NOT DETECTED. The SARS-CoV-2 RNA is generally detectable in upper and lower respiratory specimens during the acute phase of infection. Negative results do not preclude SARS-CoV-2 infection, do not rule out co-infections with other pathogens, and should not be used as the sole basis for treatment or other patient management decisions. Negative results must be combined with clinical observations, patient history, and epidemiological information. The expected result is Negative. Fact Sheet for Patients: HairSlick.no Fact Sheet for Healthcare Providers: quierodirigir.com This test is not yet approved or cleared by the [place] FDA and  has been authorized for detection and/or diagnosis of SARS-CoV-2 by FDA under an Emergency Use Authorization (EUA). This EUA will remain  in effect (meaning this test can be used) for the duration of the COVID-19 declaration under Section 56 4(b)(1) of the Act, 21 U.S.C. section 360bbb-3(b)(1), unless the authorization is terminated or revoked sooner. Performed at [facility], [address], [place], [place] [address]      Time coordinating discharge: Over 30  minutes  SIGNED:   Marinda Elk, MD  [facility] 03/08/2020, 12:27 PM Pager   If 7PM-7AM, please contact night-coverage [email or web address] Password [email or web address]

## 2020-05-08 ENCOUNTER — Ambulatory Visit (INDEPENDENT_AMBULATORY_CARE_PROVIDER_SITE_OTHER): Payer: No Typology Code available for payment source | Admitting: Orthopaedic Surgery

## 2020-05-08 ENCOUNTER — Other Ambulatory Visit: Payer: Self-pay

## 2020-05-08 ENCOUNTER — Encounter: Payer: Self-pay | Admitting: Orthopaedic Surgery

## 2020-05-08 DIAGNOSIS — Z9889 Other specified postprocedural states: Secondary | ICD-10-CM

## 2020-05-08 NOTE — Progress Notes (Signed)
Office Visit Note   Patient: Alan Brown           Date of Birth: 02/16/1951           MRN: [id number] Visit Date: 05/08/2020              Requested by: Alroy Dust, L.Marlou Sa, [place] [address] [place],  [place] [address] PCP: Alroy Dust, L.Marlou Sa, MD   Assessment & Plan: Visit Diagnoses:  1. S/P right rotator cuff repair     Plan: Impression 10 months status post rotator cuff repair.  I reviewed the operative note and the intraoperative arthroscopic pictures.  He had a retracted tear that was only semimobile.  Told him that doing another surgery is not going to give him any pain relief most likely.  I will steer him to his chronic pain management doctor to help better control his pain.  From my standpoint we will see him back as needed. Total face to face encounter time was greater than 25 minutes and over half of this time was spent in counseling and/or coordination of care.  Follow-Up Instructions: Return if symptoms worsen or fail to improve.   Orders:  No orders of the defined types were placed in this encounter.  No orders of the defined types were placed in this encounter.     Procedures: No procedures performed   Clinical Data: No additional findings.   Subjective: Chief Complaint  Patient presents with  . Right Shoulder - Pain    Alan Brown is 69 year old gentleman who is approximately 10 months status post right rotator cuff repair and debridement.  He states that he has chronic pain throughout his body and some in his right shoulder that is off and on.  He is a poor historian cannot really provide accurate details in terms of his right shoulder pain.   Review of Systems  Constitutional: Negative.   All other systems reviewed and are negative.    Objective: Vital Signs: There were no vitals taken for this visit.  Physical Exam Vitals and nursing note reviewed.  Constitutional:      Appearance: He is well-developed.  Pulmonary:   Effort: Pulmonary effort is normal.  Abdominal:     Palpations: Abdomen is soft.  Skin:    General: Skin is warm.  Neurological:     Mental Status: He is alert and oriented to person, place, and time.  Psychiatric:        Behavior: Behavior normal.        Thought Content: Thought content normal.        Judgment: Judgment normal.     Ortho Exam Right shoulder shows well-healed surgical scars.  He does not have any significant pain with passive range of motion.  Mild limitation in range of motion without significant pain.  Neurovascular intact.  Manual muscle testing is limited secondary to poor participation Specialty Comments:  No specialty comments available.  Imaging: No results found.   PMFS History: Patient Active Problem List   Diagnosis Date Noted  . S/P right rotator cuff repair 08/10/2019  . Impingement syndrome of right shoulder   . Acute respiratory failure with hypoxia ([place]) 04/23/2019  . Tear of right supraspinatus tendon 03/23/2019  . Arthrosis of right acromioclavicular joint 03/23/2019  . Depression 01/24/2014  . Polysubstance (including opioids) dependence, daily use ([place]) 01/24/2014  . Chronic pain syndrome 01/24/2014  . Sepsis ([place]) 01/23/2014  . Altered mental status 01/23/2014  . Acute encephalopathy 11/19/2013  .  SIRS (systemic inflammatory response syndrome) ([facility]) 11/19/2013  . Community acquired pneumonia 11/19/2013  . GERD (gastroesophageal reflux disease) 11/19/2013  . Bipolar 1 disorder ([facility]) 11/19/2013  . Lactic acidosis 11/19/2013  . Pneumonia 11/19/2013   Past Medical History:  Diagnosis Date  . Anxiety   . Arthritis    "all through my body" (01/26/2014)  . Bipolar 1 disorder ([facility])   . Chronic lower back pain   . Depression   . Depression   . GERD (gastroesophageal reflux disease)   . Pneumonia 1952; 11/2013  . Prolapse, disk    lower back  . Stroke [facility] [place])    pt reported TIA    Family History  Problem Relation Age of Onset  .  Hypertension Mother   . Hypertension Father   . Aneurysm Father   . Other Sister        killed  . Diabetes Sister   . Heart disease Sister     Past Surgical History:  Procedure Laterality Date  . APPENDECTOMY  1961  . INGUINAL HERNIA REPAIR Left 1998  . SHOULDER ARTHROSCOPY WITH ROTATOR CUFF REPAIR AND SUBACROMIAL DECOMPRESSION Right 06/29/2019   Procedure: RIGHT SHOULDER ARTHROSCOPY WITH ROTATOR CUFF REPAIR, BICEPS TENO SUBACROMIAL DECOMPRESSION, DISTAL CLAVICLE EXCISION, EXTENSIVE DEBRIDEMENT;  Surgeon: Tarry Kos, MD;  Location: [facility];  Service: Orthopedics;  Laterality: Right;  . TONSILLECTOMY  1976   Social History   Occupational History  . Occupation: disabled veteran  Tobacco Use  . Smoking status: Former Smoker    Packs/day: 1.00    Years: 25.00    Pack years: 25.00    Types: Cigarettes  . Smokeless tobacco: Never Used  . Tobacco comment: 01/26/2014 "quit smoking 15-16 yr ago"  Substance and Sexual Activity  . Alcohol use: No    Comment:  "quit drinking 25 yr ago; I'm a recovering alcoholic"  . Drug use: No  . Sexual activity: Not Currently

## 2020-05-10 ENCOUNTER — Emergency Department (HOSPITAL_COMMUNITY)
Admission: EM | Admit: 2020-05-10 | Discharge: 2020-05-10 | Disposition: A | Discharge status: Home / Self Care | Payer: No Typology Code available for payment source | Attending: Emergency Medicine | Admitting: Emergency Medicine

## 2020-05-10 ENCOUNTER — Other Ambulatory Visit: Payer: Self-pay

## 2020-05-10 ENCOUNTER — Encounter (HOSPITAL_COMMUNITY): Payer: Self-pay | Admitting: *Deleted

## 2020-05-10 ENCOUNTER — Emergency Department (HOSPITAL_COMMUNITY): Payer: No Typology Code available for payment source

## 2020-05-10 DIAGNOSIS — Z87891 Personal history of nicotine dependence: Secondary | ICD-10-CM | POA: Diagnosis not present

## 2020-05-10 DIAGNOSIS — R52 Pain, unspecified: Secondary | ICD-10-CM

## 2020-05-10 DIAGNOSIS — R531 Weakness: Secondary | ICD-10-CM

## 2020-05-10 DIAGNOSIS — Z79899 Other long term (current) drug therapy: Secondary | ICD-10-CM | POA: Insufficient documentation

## 2020-05-10 DIAGNOSIS — R0602 Shortness of breath: Secondary | ICD-10-CM | POA: Diagnosis not present

## 2020-05-10 DIAGNOSIS — F319 Bipolar disorder, unspecified: Secondary | ICD-10-CM | POA: Insufficient documentation

## 2020-05-10 DIAGNOSIS — R4182 Altered mental status, unspecified: Secondary | ICD-10-CM | POA: Diagnosis present

## 2020-05-10 DIAGNOSIS — R262 Difficulty in walking, not elsewhere classified: Secondary | ICD-10-CM | POA: Insufficient documentation

## 2020-05-10 DIAGNOSIS — M545 Low back pain: Secondary | ICD-10-CM | POA: Insufficient documentation

## 2020-05-10 DIAGNOSIS — Z7982 Long term (current) use of aspirin: Secondary | ICD-10-CM | POA: Diagnosis not present

## 2020-05-10 LAB — COMPREHENSIVE METABOLIC PANEL
ALT: 21 U/L (ref 0–44)
AST: 31 U/L (ref 15–41)
Albumin: 3.1 g/dL — ABNORMAL LOW (ref 3.5–5.0)
Alkaline Phosphatase: 41 U/L (ref 38–126)
Anion gap: 12 (ref 5–15)
BUN: 15 mg/dL (ref 8–23)
CO2: 29 mmol/L (ref 22–32)
Calcium: 8.6 mg/dL — ABNORMAL LOW (ref 8.9–10.3)
Chloride: 100 mmol/L (ref 98–111)
Creatinine, Ser: 0.95 mg/dL (ref 0.61–1.24)
GFR calc Af Amer: >60 mL/min (ref >60)
GFR calc non Af Amer: >60 mL/min (ref >60)
Glucose, Bld: 98 mg/dL (ref 70–99)
Potassium: 3.4 mmol/L — ABNORMAL LOW (ref 3.5–5.1)
Sodium: 141 mmol/L (ref 135–145)
Total Bilirubin: 0.5 mg/dL (ref 0.3–1.2)
Total Protein: 6.2 g/dL — ABNORMAL LOW (ref 6.5–8.1)

## 2020-05-10 LAB — CBC WITH DIFFERENTIAL/PLATELET
Abs Immature Granulocytes: 0.08 10*3/uL — ABNORMAL HIGH (ref 0.00–0.07)
Basophils Absolute: 0 10*3/uL (ref 0.0–0.1)
Basophils Relative: 1 %
Eosinophils Absolute: 0.5 10*3/uL (ref 0.0–0.5)
Eosinophils Relative: 6 %
HCT: 40.2 % (ref 39.0–52.0)
Hemoglobin: 13 g/dL (ref 13.0–17.0)
Immature Granulocytes: 1 %
Lymphocytes Relative: 29 %
Lymphs Abs: 2.2 10*3/uL (ref 0.7–4.0)
MCH: 31.7 pg (ref 26.0–34.0)
MCHC: 32.3 g/dL (ref 30.0–36.0)
MCV: 98 fL (ref 80.0–100.0)
Monocytes Absolute: 0.8 10*3/uL (ref 0.1–1.0)
Monocytes Relative: 11 %
Neutro Abs: 3.9 10*3/uL (ref 1.7–7.7)
Neutrophils Relative %: 52 %
Platelets: 123 10*3/uL — ABNORMAL LOW (ref 150–400)
RBC: 4.1 MIL/uL — ABNORMAL LOW (ref 4.22–5.81)
RDW: 13.2 % (ref 11.5–15.5)
WBC: 7.5 10*3/uL (ref 4.0–10.5)
nRBC: 0 % (ref 0.0–0.2)

## 2020-05-10 LAB — POCT I-STAT EG7
Acid-Base Excess: 5 mmol/L — ABNORMAL HIGH (ref 0.0–2.0)
Bicarbonate: 31.1 mmol/L — ABNORMAL HIGH (ref 20.0–28.0)
Calcium, Ion: 1.12 mmol/L — ABNORMAL LOW (ref 1.15–1.40)
HCT: 37 % — ABNORMAL LOW (ref 39.0–52.0)
Hemoglobin: 12.6 g/dL — ABNORMAL LOW (ref 13.0–17.0)
O2 Saturation: 58 %
Potassium: 3.6 mmol/L (ref 3.5–5.1)
Sodium: 141 mmol/L (ref 135–145)
TCO2: 33 mmol/L — ABNORMAL HIGH (ref 22–32)
pCO2, Ven: 51 mmHg (ref 44.0–60.0)
pH, Ven: 7.393 (ref 7.250–7.430)
pO2, Ven: 31 mmHg — CL (ref 32.0–45.0)

## 2020-05-10 LAB — SALICYLATE LEVEL: Salicylate Lvl: <7 mg/dL — ABNORMAL LOW (ref 7.0–30.0)

## 2020-05-10 LAB — ACETAMINOPHEN LEVEL: Acetaminophen (Tylenol), Serum: <10 ug/mL — ABNORMAL LOW (ref 10–30)

## 2020-05-10 LAB — BRAIN NATRIURETIC PEPTIDE: B Natriuretic Peptide: 29.3 pg/mL (ref 0.0–100.0)

## 2020-05-10 LAB — ETHANOL: Alcohol, Ethyl (B): <10 mg/dL (ref <10)

## 2020-05-10 LAB — TSH: TSH: 0.678 u[IU]/mL (ref 0.350–4.500)

## 2020-05-10 MED ORDER — SODIUM CHLORIDE 0.9 % IV BOLUS
1000.0000 mL | Freq: Once | INTRAVENOUS | Status: AC
  Administered 2020-05-10: 1000 mL via INTRAVENOUS

## 2020-05-10 MED ORDER — KETOROLAC TROMETHAMINE 60 MG/2ML IM SOLN
15.0000 mg | Freq: Once | INTRAMUSCULAR | Status: AC
  Administered 2020-05-10: 15 mg via INTRAMUSCULAR
  Filled 2020-05-10: qty 2

## 2020-05-10 MED ORDER — ACETAMINOPHEN 500 MG PO TABS
1000.0000 mg | ORAL_TABLET | Freq: Once | ORAL | Status: AC
  Administered 2020-05-10: 1000 mg via ORAL
  Filled 2020-05-10: qty 2

## 2020-05-10 MED ORDER — DIPHENHYDRAMINE HCL 50 MG/ML IJ SOLN
12.5000 mg | Freq: Once | INTRAMUSCULAR | Status: DC
  Filled 2020-05-10: qty 1

## 2020-05-10 NOTE — Discharge Instructions (Signed)
Follow up with your doctor.  Return for worsening fatigue.

## 2020-05-10 NOTE — ED Provider Notes (Signed)
[facility] EMERGENCY DEPARTMENT Provider Note   CSN: [id number] Arrival date & time: 05/10/20  1706     History Chief Complaint  Patient presents with  . Back Pain    [facility] is a 69 y.o. male.  69 yo M with a cc of inability to walk and confusion.  Patient has had this recurrently.  He has had admissions to this hospital for the same.  He is somewhat confused and has trouble providing most of the history.  Level 5 caveat altered mental status.  Per his wife he was at his normal state of health yesterday he went to a meeting and then was unable to walk while he was in the meeting he had to be helped home by some friends of his.  Still unable to walk today.  She denies recent injury.  He saw his orthopedic doctor yesterday.  He is complaining of pain to the left buttock.  States has been going on for about 10 days now.  Denies trauma.  Denies loss of bowel or bladder denies loss of rectal sensation denies numbness or weakness of the leg.  His wife states when he gets like this he has a shuffling gait like someone who has Parkinson's.   The history is provided by the patient.  Back Pain Location:  Lumbar spine Quality:  Aching Radiates to:  Does not radiate Pain severity:  Moderate Onset quality:  Gradual Duration:  10 days Timing:  Constant Progression:  Worsening Chronicity:  New Relieved by:  Nothing Worsened by:  Ambulation and bending Ineffective treatments:  None tried Associated symptoms: no abdominal pain, no chest pain, no fever and no headaches        Past Medical History:  Diagnosis Date  . Anxiety   . Arthritis    "all through my body" (01/26/2014)  . Bipolar 1 disorder (HCC)   . Chronic lower back pain   . Depression   . Depression   . GERD (gastroesophageal reflux disease)   . Pneumonia 1952; 11/2013  . Prolapse, disk    lower back  . Stroke [facility])    pt reported TIA    Patient Active Problem List   Diagnosis Date  Noted  . S/P right rotator cuff repair 08/10/2019  . Impingement syndrome of right shoulder   . Acute respiratory failure with hypoxia (HCC) 04/23/2019  . Tear of right supraspinatus tendon 03/23/2019  . Arthrosis of right acromioclavicular joint 03/23/2019  . Depression 01/24/2014  . Polysubstance (including opioids) dependence, daily use (HCC) 01/24/2014  . Chronic pain syndrome 01/24/2014  . Sepsis (HCC) 01/23/2014  . Altered mental status 01/23/2014  . Acute encephalopathy 11/19/2013  . SIRS (systemic inflammatory response syndrome) (HCC) 11/19/2013  . Community acquired pneumonia 11/19/2013  . GERD (gastroesophageal reflux disease) 11/19/2013  . Bipolar 1 disorder (HCC) 11/19/2013  . Lactic acidosis 11/19/2013  . Pneumonia 11/19/2013    Past Surgical History:  Procedure Laterality Date  . APPENDECTOMY  1961  . INGUINAL HERNIA REPAIR Left 1998  . SHOULDER ARTHROSCOPY WITH ROTATOR CUFF REPAIR AND SUBACROMIAL DECOMPRESSION Right 06/29/2019   Procedure: RIGHT SHOULDER ARTHROSCOPY WITH ROTATOR CUFF REPAIR, BICEPS TENO SUBACROMIAL DECOMPRESSION, DISTAL CLAVICLE EXCISION, EXTENSIVE DEBRIDEMENT;  Surgeon: Tarry Kos, MD;  Location: [facility];  Service: Orthopedics;  Laterality: Right;  . TONSILLECTOMY  1976       Family History  Problem Relation Age of Onset  . Hypertension Mother   . Hypertension Father   .  Aneurysm Father   . Other Sister        killed  . Diabetes Sister   . Heart disease Sister     Social History   Tobacco Use  . Smoking status: Former Smoker    Packs/day: 1.00    Years: 25.00    Pack years: 25.00    Types: Cigarettes  . Smokeless tobacco: Never Used  . Tobacco comment: 01/26/2014 "quit smoking 15-16 yr ago"  Substance Use Topics  . Alcohol use: No    Comment:  "quit drinking 25 yr ago; I'm a recovering alcoholic"  . Drug use: No    Home Medications Prior to Admission medications   Medication Sig Start Date End Date Taking?  Authorizing Provider  albuterol (VENTOLIN HFA) 108 (90 Base) MCG/ACT inhaler Inhale 2 puffs into the lungs 4 (four) times daily as needed for wheezing or shortness of breath.     [provider]  aspirin EC 81 MG tablet Take 81 mg by mouth daily.    [provider]  atorvastatin (LIPITOR) 20 MG tablet Take 20 mg by mouth at bedtime.    [provider]  busPIRone (BUSPAR) 15 MG tablet Take 15 mg by mouth 2 (two) times daily.    [provider]  Calcium Carbonate-Vitamin D (CALCIUM 600+D) 600-400 MG-UNIT tablet Take 1 tablet by mouth 2 (two) times daily.    [provider]  divalproex (DEPAKOTE) 500 MG DR tablet Take 1 tablet (500 mg total) by mouth every 12 (twelve) hours for 30 days. Patient taking differently: Take 500 mg by mouth See admin instructions. 500 AM and 1000 MG at night 04/27/19 03/06/20  Amin, Jeanella Flattery, MD  DULoxetine (CYMBALTA) 60 MG capsule Take 60 mg by mouth daily. For mood and pain    [provider]  ferrous sulfate 325 (65 FE) MG tablet Take 325 mg by mouth every Monday, Wednesday, and Friday.    [provider]  HYDROmorphone (DILAUDID) 2 MG tablet Take 1 tablet (2 mg total) by mouth 3 (three) times daily as needed for severe pain. Patient not taking: Reported on 08/08/2019 06/29/19   Leandrew Koyanagi, MD  methocarbamol (ROBAXIN) 500 MG tablet Take 1,000 mg by mouth 2 (two) times daily.    [provider]  Multiple Vitamin (MULTIVITAMIN WITH MINERALS) TABS tablet Take 1 tablet by mouth daily.    [provider]  omeprazole (PRILOSEC) 40 MG capsule Take 40 mg by mouth 2 (two) times daily before a meal.    [provider]  pregabalin (LYRICA) 300 MG capsule Take 300 mg by mouth at bedtime.    [provider]  primidone (MYSOLINE) 50 MG tablet Take 50 mg by mouth See admin instructions. 50 MG in AM and 100 MG at night For tremors    [provider]  promethazine (PHENERGAN) 25  MG tablet Take 1 tablet (25 mg total) by mouth every 6 (six) hours as needed for nausea. Patient not taking: Reported on 03/06/2020 06/29/19   Leandrew Koyanagi, MD  propranolol ER (INDERAL LA) 60 MG 24 hr capsule Take 60 mg by mouth daily.    [provider]  risperiDONE (RISPERDAL) 0.5 MG tablet Take 1 tablet (0.5 mg total) by mouth at bedtime as needed for up to 30 days (for agitation). Patient taking differently: Take 0.5 mg by mouth daily.  04/27/19 03/06/20  Amin, Jeanella Flattery, MD  tamsulosin (FLOMAX) 0.4 MG CAPS capsule Take 0.4 mg by mouth at  bedtime.     [provider]  Tapentadol HCl (NUCYNTA) 100 MG TABS Take 150 mg by mouth 4 (four) times daily as needed (PAIN).     [provider]  Tiotropium Bromide Monohydrate (SPIRIVA RESPIMAT) 2.5 MCG/ACT AERS Inhale 2 puffs into the lungs daily.    [provider]    Allergies    Patient has no known allergies.  Review of Systems   Review of Systems  Constitutional: Negative for chills and fever.  HENT: Negative for congestion and facial swelling.   Eyes: Negative for discharge and visual disturbance.  Respiratory: Negative for shortness of breath.   Cardiovascular: Negative for chest pain and palpitations.  Gastrointestinal: Negative for abdominal pain, diarrhea and vomiting.  Musculoskeletal: Positive for back pain. Negative for arthralgias and myalgias.  Skin: Negative for color change and rash.  Neurological: Negative for tremors, syncope and headaches.  Psychiatric/Behavioral: Negative for confusion and dysphoric mood.    Physical Exam Updated Vital Signs BP 140/90 (BP Location: Right Arm)   Pulse 80   Temp 98.6 F (37 C) (Oral)   Resp 16   Wt 95.7 kg   SpO2 94%   BMI 30.28 kg/m   Physical Exam Vitals and nursing note reviewed.  Constitutional:      Appearance: He is well-developed.  HENT:     Head: Normocephalic and atraumatic.  Eyes:     Pupils: Pupils are equal, round, and reactive  to light.  Neck:     Vascular: No JVD.  Cardiovascular:     Rate and Rhythm: Normal rate and regular rhythm.     Heart sounds: No murmur. No friction rub. No gallop.   Pulmonary:     Effort: No respiratory distress.     Breath sounds: No wheezing.  Abdominal:     General: There is no distension.     Tenderness: There is no guarding or rebound.  Musculoskeletal:        General: Tenderness present. Normal range of motion.     Cervical back: Normal range of motion and neck supple.     Comments: TTP to the left piriformis muscle belly. Pulse motor and sensation are intact distally. Patient shows signs of muscle wasting to the legs bilaterally. He also has 2+ pitting edema to bilateral lower extremities extending up to the buttock. Reflexes are diminished bilaterally. No clonus negative Babinski.  Skin:    Coloration: Skin is not pale.     Findings: No rash.  Neurological:     Mental Status: He is alert and oriented to person, place, and time.  Psychiatric:        Behavior: Behavior normal.     ED Results / Procedures / Treatments   Labs (all labs ordered are listed, but only abnormal results are displayed) Labs Reviewed  CBC WITH DIFFERENTIAL/PLATELET - Abnormal; Notable for the following components:      Result Value   RBC 4.10 (*)    Platelets 123 (*)    Abs Immature Granulocytes 0.08 (*)    All other components within normal limits  COMPREHENSIVE METABOLIC PANEL - Abnormal; Notable for the following components:   Potassium 3.4 (*)    Calcium 8.6 (*)    Total Protein 6.2 (*)    Albumin 3.1 (*)    All other components within normal limits  SALICYLATE LEVEL - Abnormal; Notable for the following components:   Salicylate Lvl <7.0 (*)    All other components within normal limits  ACETAMINOPHEN LEVEL -  Abnormal; Notable for the following components:   Acetaminophen (Tylenol), Serum <10 (*)    All other components within normal limits  POCT I-STAT EG7 - Abnormal; Notable for  the following components:   pO2, Ven 31.0 (*)    Bicarbonate 31.1 (*)    TCO2 33 (*)    Acid-Base Excess 5.0 (*)    Calcium, Ion 1.12 (*)    HCT 37.0 (*)    Hemoglobin 12.6 (*)    All other components within normal limits  BRAIN NATRIURETIC PEPTIDE  ETHANOL  TSH  URINALYSIS, ROUTINE W REFLEX MICROSCOPIC  RAPID URINE DRUG SCREEN, HOSP PERFORMED    EKG None  Radiology DG Chest 1 View  Result Date: 05/10/2020 CLINICAL DATA:  Weakness EXAM: CHEST  1 VIEW COMPARISON:  03/06/2020 FINDINGS: No focal airspace disease or effusion. Stable cardiomediastinal silhouette. No pneumothorax. IMPRESSION: No active disease. Electronically Signed   By: Jasmine PangKim  Fujinaga M.D.   On: 05/10/2020 18:15   DG Hip Unilat W or Wo Pelvis 2-3 Views Left  Result Date: 05/10/2020 CLINICAL DATA:  Leg swelling EXAM: DG HIP (WITH OR WITHOUT PELVIS) 2-3V LEFT COMPARISON:  None. FINDINGS: SI joints are non widened. Pubic symphysis and rami appear intact. Possible poles deformity at the right inferior pubic ramus. Both femoral heads project in joint. No acute displaced fracture or malalignment. IMPRESSION: No acute osseous abnormality Electronically Signed   By: Jasmine PangKim  Fujinaga M.D.   On: 05/10/2020 18:14    Procedures Procedures (including critical care time)  Medications Ordered in ED Medications  diphenhydrAMINE (BENADRYL) injection 12.5 mg (12.5 mg Intravenous Not Given 05/10/20 1915)  acetaminophen (TYLENOL) tablet 1,000 mg (1,000 mg Oral Given 05/10/20 1913)  ketorolac (TORADOL) injection 15 mg (15 mg Intramuscular Given 05/10/20 1911)  sodium chloride 0.9 % bolus 1,000 mL (0 mLs Intravenous Stopped 05/10/20 2028)    ED Course  I have reviewed the triage vital signs and the nursing notes.  Pertinent labs & imaging results that were available during my care of the patient were reviewed by me and considered in my medical decision making (see chart for details).    MDM Rules/Calculators/A&P                       69 yo M with a chief complaints of inability to walk. This is apparently a recurrent problem for him. Wife discusses a parkinsonian-like syndrome that seems to occur. Has had multiple admissions for the same. Will obtain a laboratory evaluation chest x-ray plain film of the left hip. Dose of Benadryl for possible dystonia.  Patient is feeling better on reassessment.  He was able to walk to the bathroom without assistance with a stuttering gait.  Could be from his dopaminergic medications.  He would like to go home.  Case management has assessed him for home health or skilled nursing facility which she is declining.  He would like a rolling walker which they will deliver to his house.   9:00 PM:  I have discussed the diagnosis/risks/treatment options with the patient and family and believe the pt to be eligible for discharge home to follow-up with PCP. We also discussed returning to the ED immediately if new or worsening sx occur. We discussed the sx which are most concerning (e.g., sudden worsening pain, fever, inability to tolerate by mouth) that necessitate immediate return. Medications administered to the patient during their visit and any new prescriptions provided to the patient are listed below.  Medications given during  this visit Medications  diphenhydrAMINE (BENADRYL) injection 12.5 mg (12.5 mg Intravenous Not Given 05/10/20 1915)  acetaminophen (TYLENOL) tablet 1,000 mg (1,000 mg Oral Given 05/10/20 1913)  ketorolac (TORADOL) injection 15 mg (15 mg Intramuscular Given 05/10/20 1911)  sodium chloride 0.9 % bolus 1,000 mL (0 mLs Intravenous Stopped 05/10/20 2028)     The patient appears reasonably screen and/or stabilized for discharge and I doubt any other medical condition or other [facility] requiring further screening, evaluation, or treatment in the ED at this time prior to discharge.     Final Clinical Impression(s) / ED Diagnoses Final diagnoses:  Weakness    Rx / DC Orders ED  Discharge Orders    None       Melene Plan, DO 05/10/20 2101

## 2020-05-10 NOTE — ED Notes (Signed)
Pt has not voided yet.  Pt helped to stand up and ambulated with assistance

## 2020-05-10 NOTE — Care Management (Signed)
ED CM noted TOC consult, CM met with patient to discuss possible HH services and DME.  Patient was recently discharged and refused all [place] services. Patient reports not wanting [place] states he has an appointment with the [facility] Neurologist 7/1 and prefers to wait until the appointment with the Neurologist. But he has agreed to DME /rolling walker.  CM will obtain an order and fax into [employer] to have walker delivered to patient's home

## 2020-05-10 NOTE — ED Notes (Signed)
Pt has not been able to void yet\ 

## 2020-05-10 NOTE — ED Notes (Signed)
Pt helped up to attempt to void earlier in the visit and then later helped him up and ambulated with him out in the hall

## 2020-05-10 NOTE — ED Triage Notes (Signed)
Pt arrives by ems from home where he lives with his wife and two children.  Pt has had intermittent weakness that comes and goes.  THis is associated with problems with coordination.  Pt reports that he has been having lower back pain with radiation to his left buttock (no pain into legs).  Pt reports lack of coordiation and diffictulty ambulating.  Pt reports "undiagnosed neurological issue that he is suffering from which has been flared up for a 10 days"

## 2020-05-15 IMAGING — DX DG CHEST 1V PORT
1 series · 1 of 1 positions shown · non-contrast
Comparison: Chest CTA 04/22/2019 and earlier.

CLINICAL DATA: 68-year-old male with altered mental status,
unresponsive.

EXAM:
PORTABLE CHEST 1 VIEW

[chest ap]
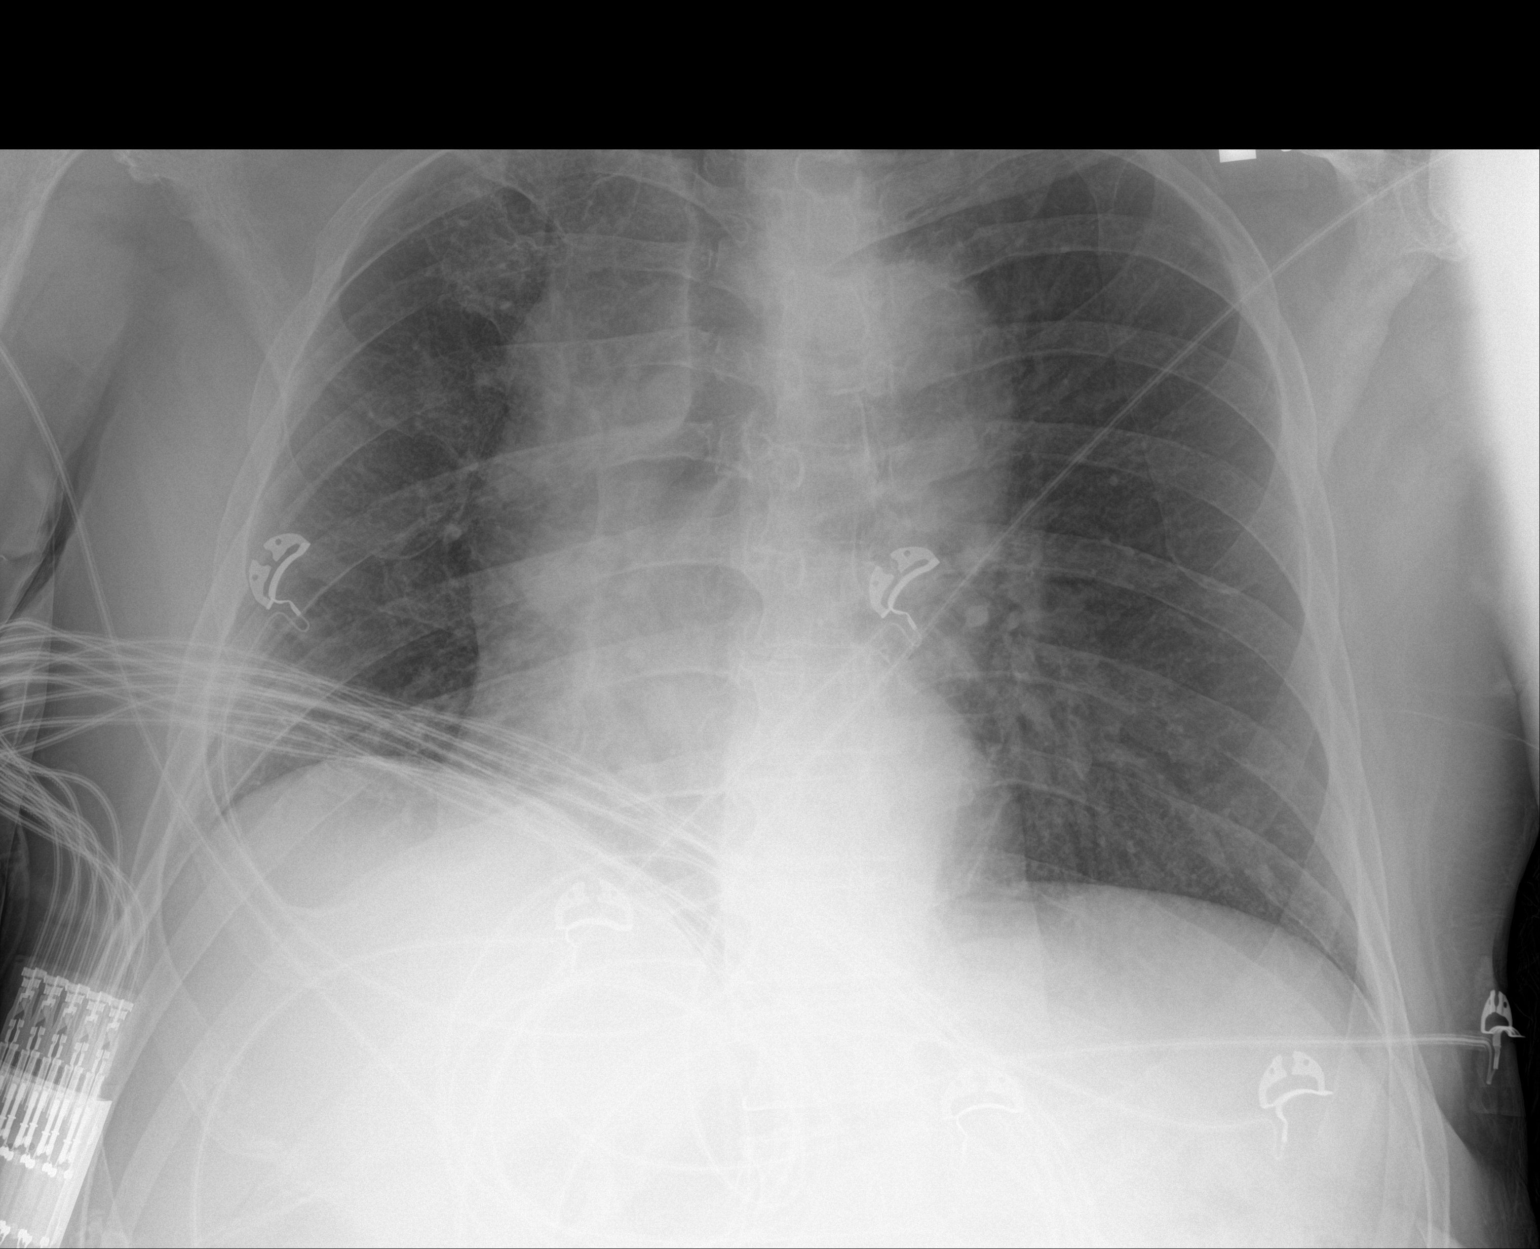

[1 of 1 positions shown; findings below may reference images not displayed]

FINDINGS: Portable AP semi upright view at 9511 hours. Rotated to the right.
Chronic tortuosity of the thoracic aorta. Other mediastinal contours
are within normal limits. Visualized tracheal air column is within
normal limits. Stable lung volumes. Allowing for portable technique
the lungs are clear. No pneumothorax or pleural effusion. No acute
osseous abnormality identified. Negative visible bowel gas pattern.
IMPRESSION: No acute cardiopulmonary abnormality.

## 2020-05-16 ENCOUNTER — Other Ambulatory Visit: Payer: Self-pay | Admitting: Pain Medicine

## 2020-05-16 DIAGNOSIS — M5440 Lumbago with sciatica, unspecified side: Secondary | ICD-10-CM

## 2020-05-16 DIAGNOSIS — G8929 Other chronic pain: Secondary | ICD-10-CM

## 2020-07-19 IMAGING — DX DG CHEST 1V
1 series · 1 of 1 positions shown · non-contrast
Comparison: 03/06/2020

CLINICAL DATA: Weakness

EXAM:
CHEST  1 VIEW

[chest ap]
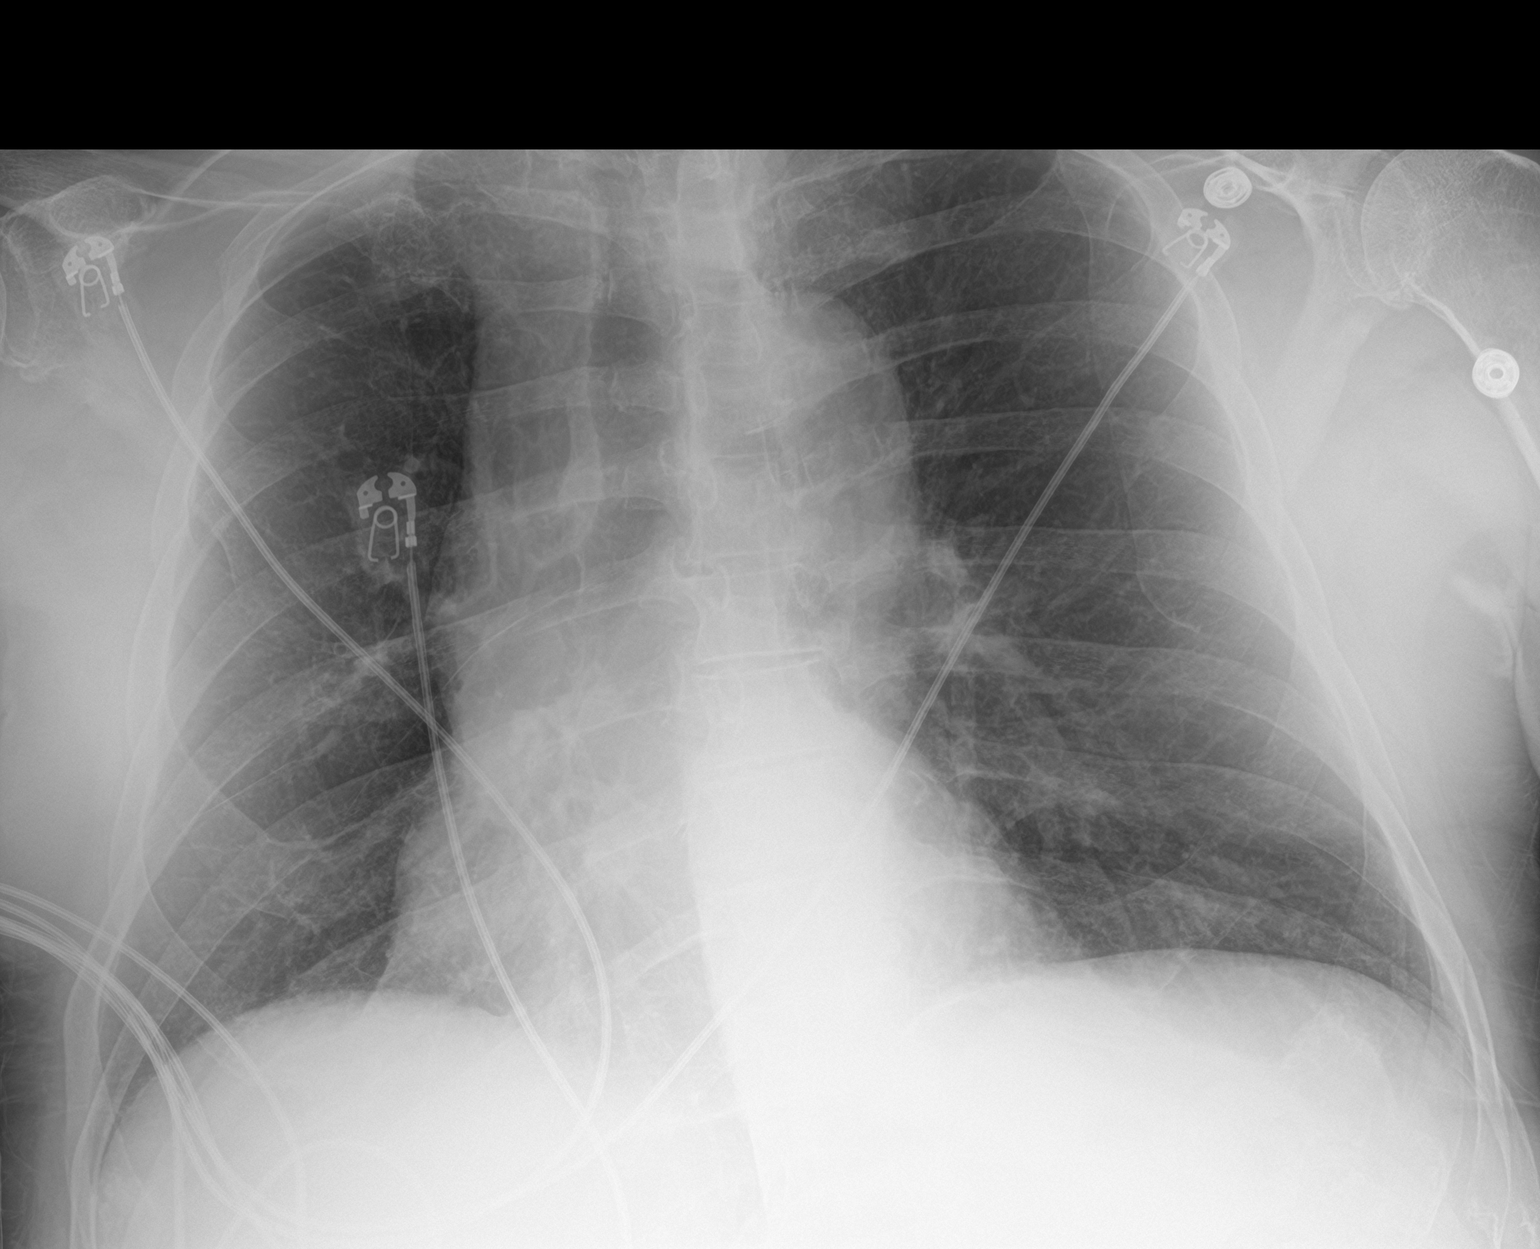

[1 of 1 positions shown; findings below may reference images not displayed]

FINDINGS: No focal airspace disease or effusion. Stable cardiomediastinal
silhouette. No pneumothorax.
IMPRESSION: No active disease.

## 2020-07-19 IMAGING — DX DG HIP (WITH OR WITHOUT PELVIS) 2-3V*L*
3 series · 3 of 3 positions shown · non-contrast
Comparison: None.

CLINICAL DATA: Leg swelling

EXAM:
DG HIP (WITH OR WITHOUT PELVIS) 2-3V LEFT

[pelvis ap]
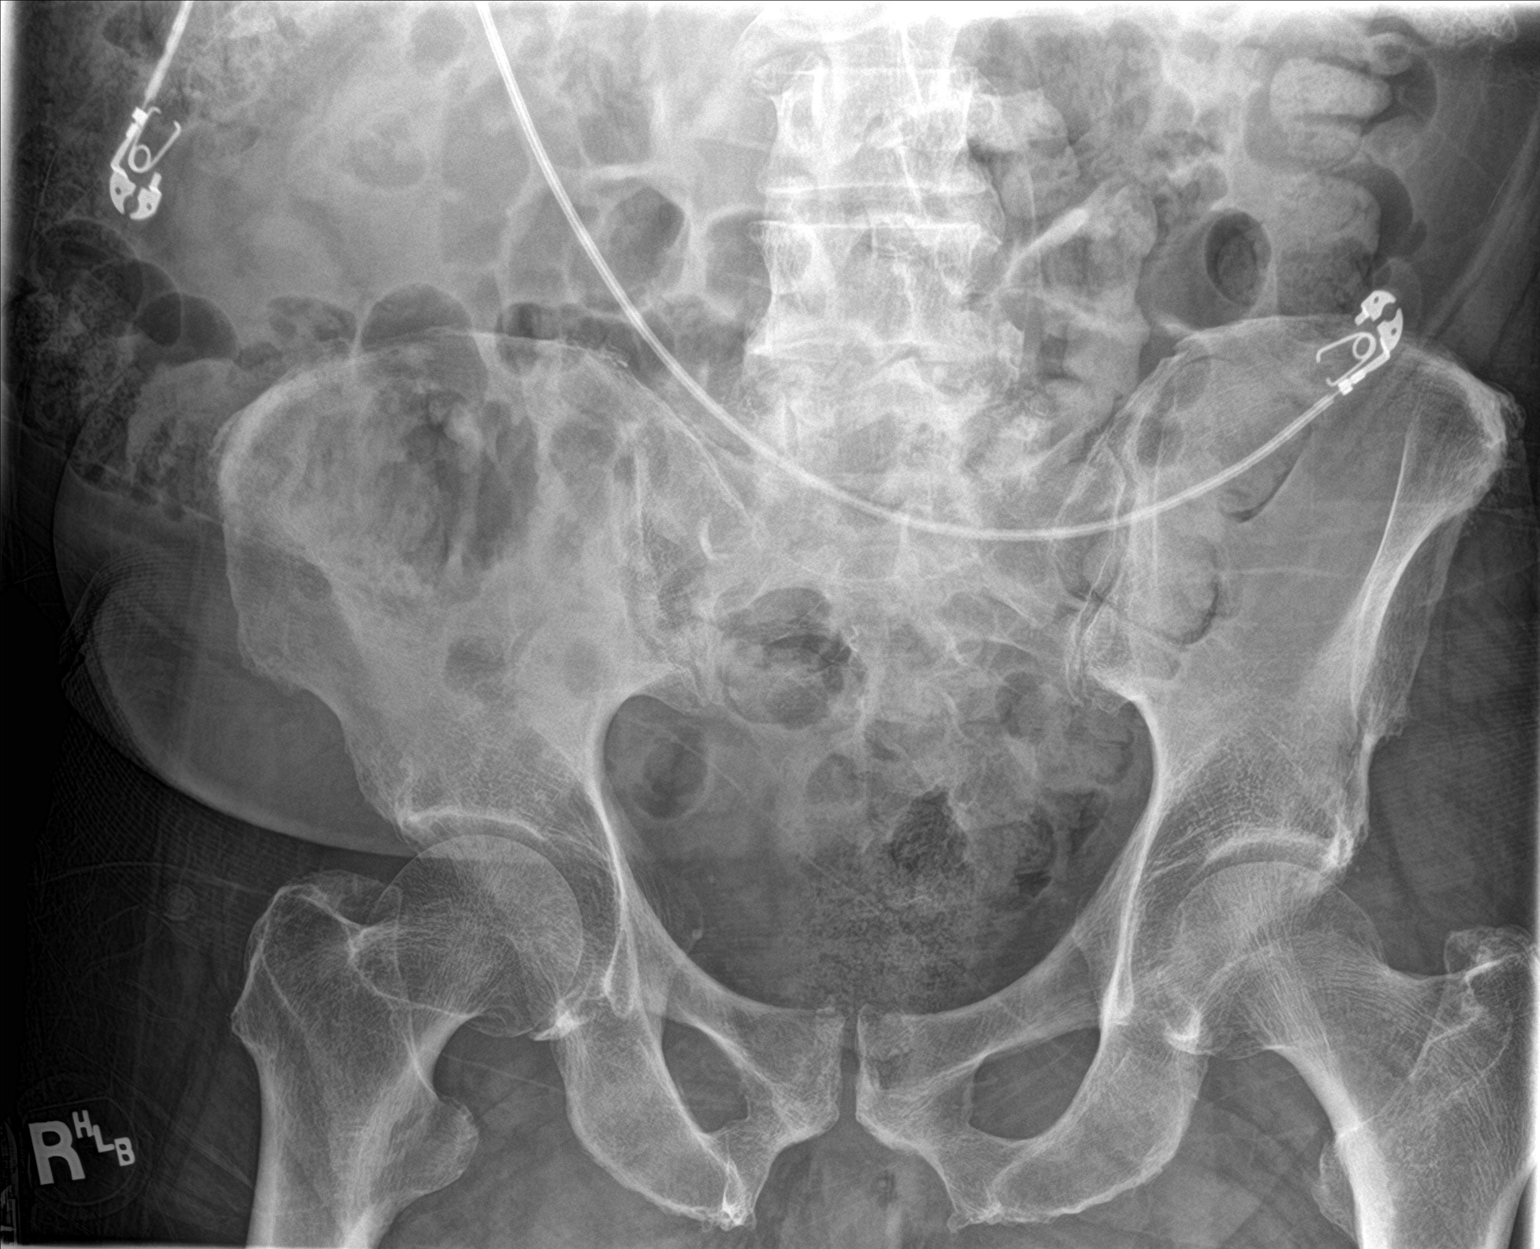

[hip ap]
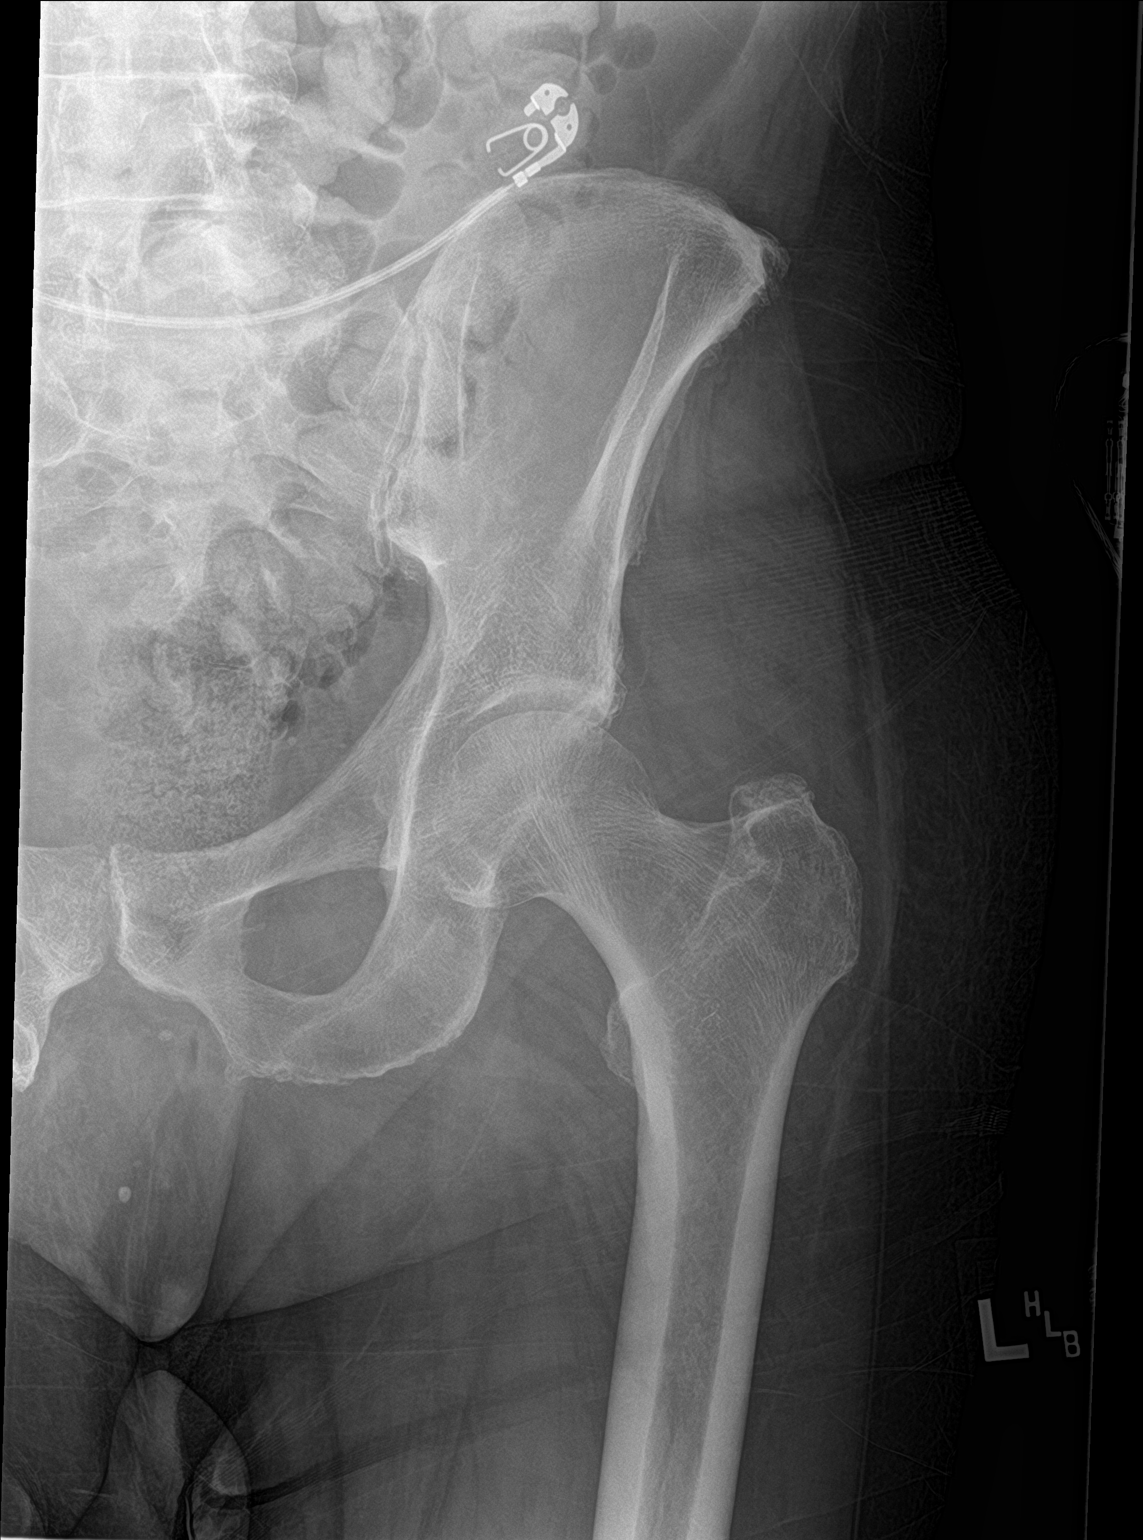

[hip lat]
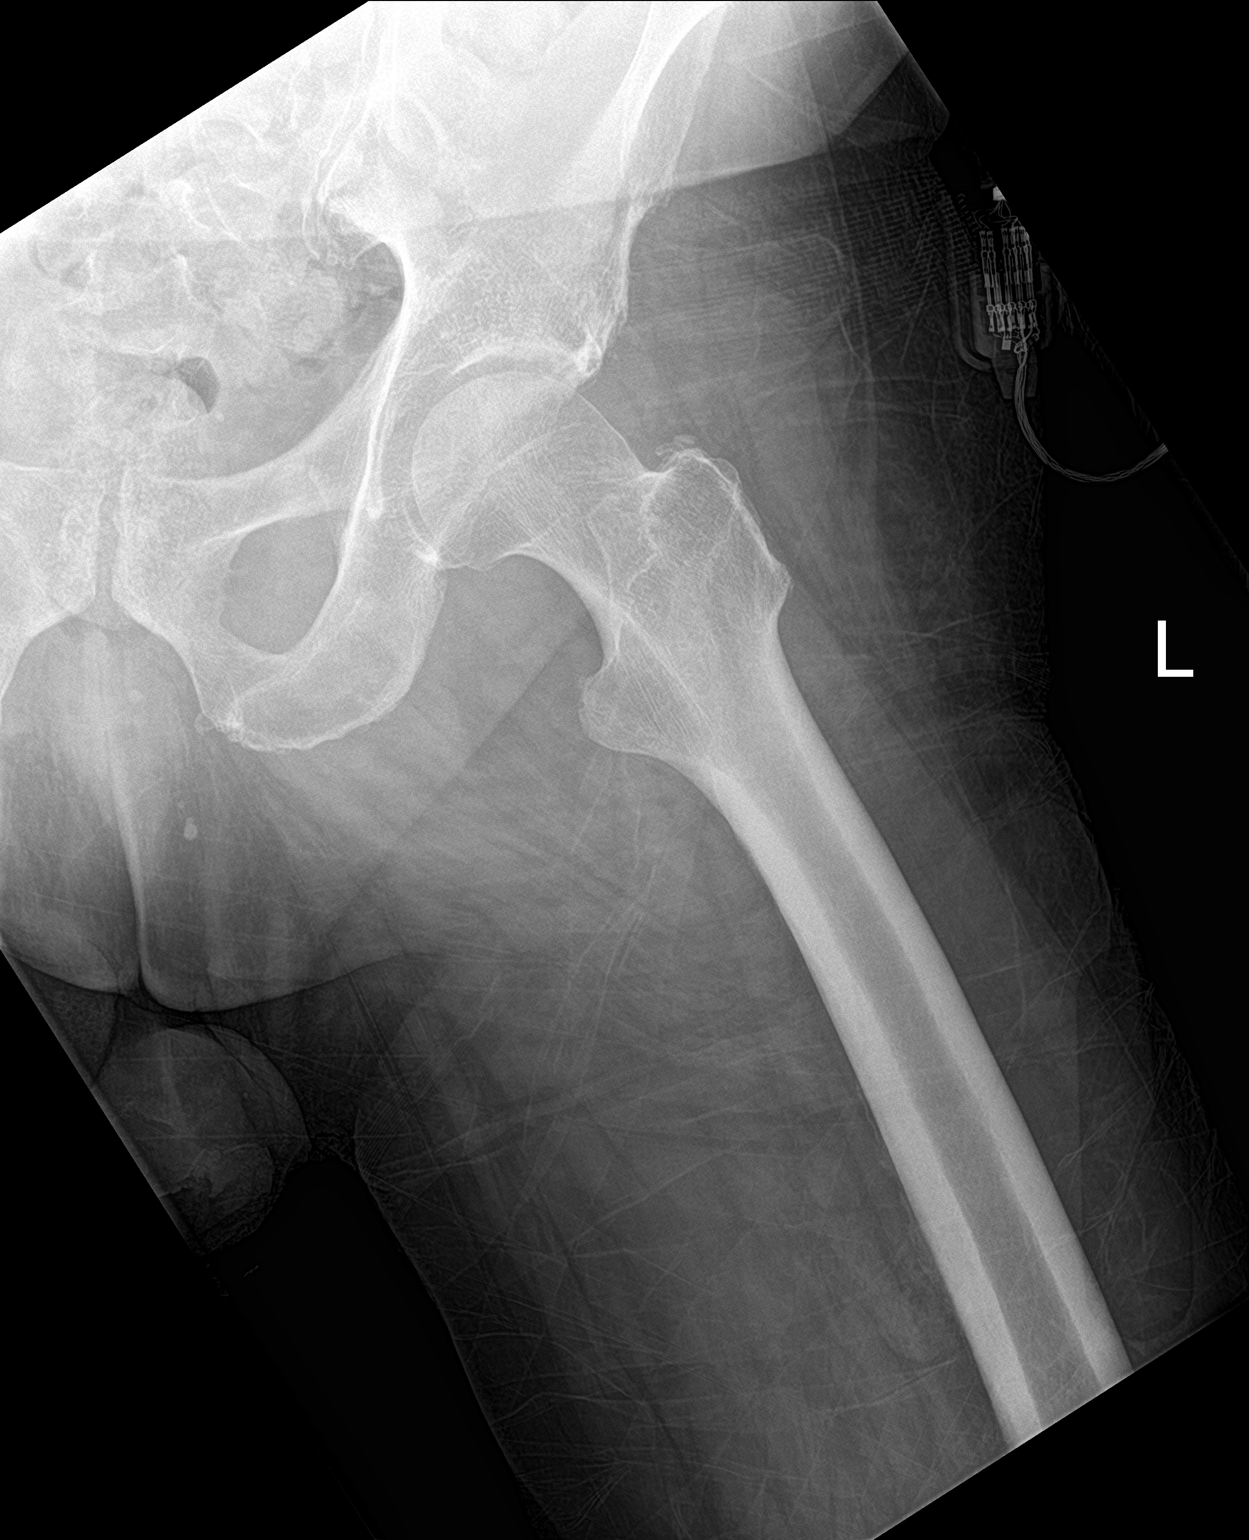

[3 of 3 positions shown; findings below may reference images not displayed]

FINDINGS: SI joints are non widened. Pubic symphysis and rami appear intact.
Possible poles deformity at the right inferior pubic ramus. Both
femoral heads project in joint. No acute displaced fracture or
malalignment.
IMPRESSION: No acute osseous abnormality

## 2020-07-26 DIAGNOSIS — Z79899 Other long term (current) drug therapy: Secondary | ICD-10-CM | POA: Diagnosis not present

## 2020-07-26 DIAGNOSIS — F1120 Opioid dependence, uncomplicated: Secondary | ICD-10-CM | POA: Diagnosis not present

## 2020-07-26 DIAGNOSIS — Z79891 Long term (current) use of opiate analgesic: Secondary | ICD-10-CM | POA: Diagnosis not present

## 2020-08-07 DIAGNOSIS — L600 Ingrowing nail: Secondary | ICD-10-CM | POA: Diagnosis not present

## 2020-08-09 DIAGNOSIS — Z79899 Other long term (current) drug therapy: Secondary | ICD-10-CM | POA: Diagnosis not present

## 2020-08-09 DIAGNOSIS — Z79891 Long term (current) use of opiate analgesic: Secondary | ICD-10-CM | POA: Diagnosis not present

## 2020-08-09 DIAGNOSIS — F1120 Opioid dependence, uncomplicated: Secondary | ICD-10-CM | POA: Diagnosis not present

## 2020-08-14 NOTE — Progress Notes (Signed)
Assessment/Plan:   1.  Parkinsons Disease  -I do think he looks more parkinsonian than he did.  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  He is on levodopa today.  He is not rigid at all, but does have some rest tremor and very mild bradykinesia.  He thinks that levodopa has helped, but wanted surgery in hopes that it would help tremor and balance and that he would be able to get off medicine.  I explained to the patient that surgery does not do better than medication (tremor may to be the exception), and that we generally do surgery on patients who are having motor fluctuations or wearing off.  He is not having either.  He was just diagnosed with Parkinson's in July and is on starting dose of medication.  In addition, he has a long history of bipolar disorder, and we would need to make sure that was well controlled before any type of surgical intervention.  There is also a diagnosis of Alzheimer's dementia in his records, and he is on memantine.  If that is an accurate diagnosis (I am not completely convinced), then we would not do surgery on him.  He would obviously need neurocognitive testing before that.  Regardless, I told the patient that I would not recommend surgery right now.  He is too early on in the disease process, with medications that really are controlling the disease.  In addition, he was hoping that it would help balance, which surgery will not.  If anything, surgery can potentially make balance worse.  He also was hoping that he would not need to take medication post surgery, and statistics would say that this is not necessarily the case either.  Ultimately, I recommended that he continue with levodopa for now.  We can reevaluate possible needs for surgery in the future.  -We talked about the importance of safe, cardiovascular exercise.  He admits that he has been out of exercise for the last 7  months and really would like to get back into it.  He states that his exercise equipment has been in storage.  I gave him information on community exercise programs for Parkinson's disease, as well as scholarship programs to these exercise programs.  -[facility].  He would like to follow-up with me on an as-needed basis.   Subjective:   Alan Brown was seen today in follow up at the request of the [facility] for evaluation for DBS for Parkinson's disease.  I have not seen the patient since 2019.  In 2019, the patient was evaluated for possible Parkinson's disease.  At that point in time, he did not have Parkinson's disease.  He apparently had previously had drug-induced parkinsonism (risperidone since 2012) and had also had a history of lithium-induced tremor.  When I last saw the patient, he was off of the lithium, but was on Depakote.  Today, as above, the patient is sent from the [facility] for an evaluation for possible DBS for Parkinson's disease.  He comes today on levodopa.  Patient has been following with the neurology clinic at the [facility].  I have notes from July 11, 2020.  Notes indicate that he was started on levodopa in early July and that DaTscan was completed on July 13, 2020.  This was done through [facility].  Radiotracer was decreased  bilaterally, right more than left.  Notes from [place] also stated that "we do believe he has early dementia, most likely Alzheimer's disease, and that this was confirmed by his wife, Alan Brown, on the phone."  Pt unaccompanied today.  States that he doesn't want to bother his wife with his medical problems.  Doesn't know meds that he takes with the exception of his oxycodone and morphine (we got meds from records sent with referral).  When asked, he does know that he takes levodopa at 8am/1pm/5pm.  States that he shakes less with the med.  States "I am going to opt for the deep brain  stimulation surgery though."  Patient stated that his goal was to stop tremor, get off Parkinson's medicine and improved balance.  Last fall was about  50months ago - he stepped on a water hose and fell back and hit head.     Sleep:   Vivid Dreams:  Yes.    Acting out dreams:  Some sleep talk Wet Pillows: No. Postural symptoms:  Yes.    Falls? None x 6 months Bradykinesia symptoms: slow movements, difficulty getting out of a chair and difficulty regaining balance Loss of smell:  No. Loss of taste:  No. Urinary Incontinence:  No. but has frequency Difficulty Swallowing:  No. Handwriting, micrographia: No., but gotten bigger Trouble with ADL's:  No.  Trouble buttoning clothing: Yes.  , some Depression:  "I'm in a pretty good mood" Memory changes:  It could be better. Hallucinations:  No.  visual distortions: Yes.   N/V:  No. Lightheaded:  No.  Syncope: No. Diplopia:  No. , better after cataract sx  Current prescribed movement disorder medications: Carbidopa/levodopa 25/100, 1 tablet 3 times per day (8am/1pm/5pm - "little helpful - "I shake less") Primidone, 50 mg, 1 tablet 3 times per day Propranolol LA, 120 mg daily Memantine, 10 mg, 2 tablets at bedtime  PREVIOUS MEDICATIONS: risperdone (for years and developed drug induced Parkinsons Disease); lithium in past  ALLERGIES:  No Known Allergies  CURRENT MEDICATIONS:  Outpatient Encounter Medications as of 08/15/2020  Medication Sig  . albuterol (VENTOLIN HFA) 108 (90 Base) MCG/ACT inhaler Inhale 2 puffs into the lungs 4 (four) times daily as needed for wheezing or shortness of breath.   Marland Kitchen aspirin EC 81 MG tablet Take 81 mg by mouth daily.  . busPIRone (BUSPAR) 15 MG tablet Take 15 mg by mouth 2 (two) times daily.  . Calcium Carbonate-Vitamin D (CALCIUM 600+D) 600-400 MG-UNIT tablet Take 1 tablet by mouth 2 (two) times daily.  . DULoxetine (CYMBALTA) 60 MG capsule Take 60 mg by mouth daily. For mood and pain  . ferrous sulfate  325 (65 FE) MG tablet Take 325 mg by mouth every Monday, Wednesday, and Friday.  . methocarbamol (ROBAXIN) 500 MG tablet Take 1,000 mg by mouth 2 (two) times daily.  Marland Kitchen morphine (MS CONTIN) 15 MG 12 hr tablet Take 1 tablet by mouth in the morning and at bedtime.  . Multiple Vitamin (MULTIVITAMIN WITH MINERALS) TABS tablet Take 1 tablet by mouth daily.  Marland Kitchen omeprazole (PRILOSEC) 40 MG capsule Take 40 mg by mouth 2 (two) times daily before a meal.  . Oxycodone HCl 20 MG TABS Take 1 tablet by mouth 5 (five) times daily.  . pregabalin (LYRICA) 300 MG capsule Take 300 mg by mouth at bedtime.  . primidone (MYSOLINE) 50 MG tablet Take 50 mg by mouth See admin instructions. 50 MG in AM and 100 MG at night For tremors  .  promethazine (PHENERGAN) 25 MG tablet Take 1 tablet (25 mg total) by mouth every 6 (six) hours as needed for nausea.  . propranolol ER (INDERAL LA) 60 MG 24 hr capsule Take 60 mg by mouth daily.  . risperiDONE (RISPERDAL) 0.5 MG tablet Take 1 tablet (0.5 mg total) by mouth at bedtime as needed for up to 30 days (for agitation). (Patient taking differently: Take 0.5 mg by mouth daily. )  . tamsulosin (FLOMAX) 0.4 MG CAPS capsule Take 0.4 mg by mouth at bedtime.   . Tapentadol HCl (NUCYNTA) 100 MG TABS Take 150 mg by mouth 4 (four) times daily as needed (PAIN).   . Tiotropium Bromide Monohydrate (SPIRIVA RESPIMAT) 2.5 MCG/ACT AERS Inhale 2 puffs into the lungs daily.  Marland Kitchen atorvastatin (LIPITOR) 20 MG tablet Take 20 mg by mouth at bedtime. (Patient not taking: Reported on 08/15/2020)  . [DISCONTINUED] divalproex (DEPAKOTE) 500 MG DR tablet Take 1 tablet (500 mg total) by mouth every 12 (twelve) hours for 30 days. (Patient taking differently: Take 500 mg by mouth See admin instructions. 500 AM and 1000 MG at night)  . [DISCONTINUED] HYDROmorphone (DILAUDID) 2 MG tablet Take 1 tablet (2 mg total) by mouth 3 (three) times daily as needed for severe pain. (Patient not taking: Reported on 08/15/2020)   No  facility-administered encounter medications on file as of 08/15/2020.    Objective:   PHYSICAL EXAMINATION:    VITALS:   Vitals:   08/15/20 1000  BP: 123/69  Pulse: 74  SpO2: 92%  Weight: 220 lb (99.8 kg)  Height: 5\' 10"  (1.778 m)    GEN:  The patient appears stated age and is in NAD. HEENT:  Normocephalic, atraumatic.  The mucous membranes are moist. The superficial temporal arteries are without ropiness or tenderness. CV:  RRR Lungs:  CTAB Neck/HEME:  There are no carotid bruits bilaterally.  Neurological examination:  Orientation: The patient is alert and oriented x3. Cranial nerves: There is good facial symmetry with facial hypomimia. The speech is fluent and clear. Soft palate rises symmetrically and there is no tongue deviation. Hearing is intact to conversational tone. Sensation: Sensation is intact to light touch throughout Motor: Strength is at least antigravity x4.  Movement examination: Tone: There is normal tone in the upper and lower extremities Abnormal movements:  mild R>LUE rest tremor Coordination:  There is  decremation with RAM's, only with  alternation of supination/pronation of the forearm on the right Gait and Station: The patient has mild difficulty arising out of a deep-seated chair without the use of the hands. The patient's stride length is short stepped and just slightly shuffling.   I have reviewed and interpreted the following labs independently    Chemistry      Component Value Date/Time   NA 141 05/10/2020 1854   K 3.6 05/10/2020 1854   CL 100 05/10/2020 1846   CO2 29 05/10/2020 1846   BUN 15 05/10/2020 1846   CREATININE 0.95 05/10/2020 1846      Component Value Date/Time   CALCIUM 8.6 (L) 05/10/2020 1846   ALKPHOS 41 05/10/2020 1846   AST 31 05/10/2020 1846   ALT 21 05/10/2020 1846   BILITOT 0.5 05/10/2020 1846       Lab Results  Component Value Date   WBC 7.5 05/10/2020   HGB 12.6 (L) 05/10/2020   HCT 37.0 (L) 05/10/2020    MCV 98.0 05/10/2020   PLT 123 (L) 05/10/2020    Lab Results  Component Value Date  TSH 0.678 05/10/2020     Total time spent on today's visit was 60 minutes, including both face-to-face time and nonface-to-face time.  Time included that spent on review of records (prior notes available to me/labs/imaging if pertinent), discussing treatment and goals, answering patient's questions and coordinating care.  Cc:  Clovis RileyMitchell, L.August Saucerean, MD

## 2020-08-15 ENCOUNTER — Encounter: Payer: Self-pay | Admitting: Neurology

## 2020-08-15 ENCOUNTER — Other Ambulatory Visit: Payer: Self-pay

## 2020-08-15 ENCOUNTER — Ambulatory Visit (INDEPENDENT_AMBULATORY_CARE_PROVIDER_SITE_OTHER): Payer: No Typology Code available for payment source | Admitting: Neurology

## 2020-08-15 ENCOUNTER — Telehealth: Payer: Self-pay | Admitting: Neurology

## 2020-08-15 VITALS — BP 123/69 | HR 74 | Ht 70.0 in | Wt 220.0 lb

## 2020-08-15 DIAGNOSIS — G20 Parkinson's disease: Secondary | ICD-10-CM | POA: Diagnosis not present

## 2020-08-15 NOTE — Patient Instructions (Signed)
For information about the Parkinson's Exercise Scholarship contact:  Geradine Girt [phone] [email or web address]  The physicians and staff at [facility] are committed to providing excellent care. You may receive a survey requesting feedback about your experience at our office. We strive to receive "very good" responses to the survey questions. If you feel that your experience would prevent you from giving the office a "very good " response, please contact our office to try to remedy the situation. We may be reached at [phone]. Thank you for taking the time out of your busy day to complete the survey.

## 2020-08-23 DIAGNOSIS — T8189XA Other complications of procedures, not elsewhere classified, initial encounter: Secondary | ICD-10-CM | POA: Diagnosis not present

## 2020-08-23 DIAGNOSIS — L03032 Cellulitis of left toe: Secondary | ICD-10-CM | POA: Diagnosis not present

## 2020-08-24 DIAGNOSIS — Z23 Encounter for immunization: Secondary | ICD-10-CM | POA: Diagnosis not present

## 2020-08-24 DIAGNOSIS — L309 Dermatitis, unspecified: Secondary | ICD-10-CM | POA: Diagnosis not present

## 2020-09-06 DIAGNOSIS — F1120 Opioid dependence, uncomplicated: Secondary | ICD-10-CM | POA: Diagnosis not present

## 2020-09-06 DIAGNOSIS — Z79899 Other long term (current) drug therapy: Secondary | ICD-10-CM | POA: Diagnosis not present

## 2020-09-06 DIAGNOSIS — Z79891 Long term (current) use of opiate analgesic: Secondary | ICD-10-CM | POA: Diagnosis not present

## 2020-09-06 DIAGNOSIS — F1121 Opioid dependence, in remission: Secondary | ICD-10-CM | POA: Diagnosis not present

## 2020-09-11 ENCOUNTER — Telehealth: Payer: Self-pay

## 2020-09-11 NOTE — Telephone Encounter (Signed)
The [facility] representative stated they never got the notes from the patient's visit with Dr. Arbutus Leas. Can someone fax them to [phone]?

## 2020-09-11 NOTE — Telephone Encounter (Addendum)
Left detailed message for patient informing patient that someone from the [place] called requesting records. Advised him that we can not send over any notes with out a release. Informed him that he can sign a release at our office or the [place] then we can release records.   Advised patient to contact office with any questions or concerns.

## 2020-09-20 DIAGNOSIS — Z79899 Other long term (current) drug therapy: Secondary | ICD-10-CM | POA: Diagnosis not present

## 2020-09-20 DIAGNOSIS — F1120 Opioid dependence, uncomplicated: Secondary | ICD-10-CM | POA: Diagnosis not present

## 2020-09-20 DIAGNOSIS — Z79891 Long term (current) use of opiate analgesic: Secondary | ICD-10-CM | POA: Diagnosis not present

## 2020-10-10 DIAGNOSIS — Z1159 Encounter for screening for other viral diseases: Secondary | ICD-10-CM | POA: Diagnosis not present

## 2020-10-18 DIAGNOSIS — F1120 Opioid dependence, uncomplicated: Secondary | ICD-10-CM | POA: Diagnosis not present

## 2020-10-18 DIAGNOSIS — Z79899 Other long term (current) drug therapy: Secondary | ICD-10-CM | POA: Diagnosis not present

## 2020-10-18 DIAGNOSIS — Z79891 Long term (current) use of opiate analgesic: Secondary | ICD-10-CM | POA: Diagnosis not present

## 2020-11-01 DIAGNOSIS — F1120 Opioid dependence, uncomplicated: Secondary | ICD-10-CM | POA: Diagnosis not present

## 2020-11-01 DIAGNOSIS — M542 Cervicalgia: Secondary | ICD-10-CM | POA: Diagnosis not present

## 2020-11-01 DIAGNOSIS — Z79891 Long term (current) use of opiate analgesic: Secondary | ICD-10-CM | POA: Diagnosis not present

## 2020-11-01 DIAGNOSIS — Z79899 Other long term (current) drug therapy: Secondary | ICD-10-CM | POA: Diagnosis not present

## 2020-11-15 DIAGNOSIS — Z79891 Long term (current) use of opiate analgesic: Secondary | ICD-10-CM | POA: Diagnosis not present

## 2020-11-15 DIAGNOSIS — F1120 Opioid dependence, uncomplicated: Secondary | ICD-10-CM | POA: Diagnosis not present

## 2020-11-15 DIAGNOSIS — F1121 Opioid dependence, in remission: Secondary | ICD-10-CM | POA: Diagnosis not present

## 2020-11-15 DIAGNOSIS — Z79899 Other long term (current) drug therapy: Secondary | ICD-10-CM | POA: Diagnosis not present

## 2020-12-06 DIAGNOSIS — Z79891 Long term (current) use of opiate analgesic: Secondary | ICD-10-CM | POA: Diagnosis not present

## 2020-12-06 DIAGNOSIS — Z79899 Other long term (current) drug therapy: Secondary | ICD-10-CM | POA: Diagnosis not present

## 2020-12-06 DIAGNOSIS — F1120 Opioid dependence, uncomplicated: Secondary | ICD-10-CM | POA: Diagnosis not present

## 2020-12-27 DIAGNOSIS — Z79899 Other long term (current) drug therapy: Secondary | ICD-10-CM | POA: Diagnosis not present

## 2020-12-27 DIAGNOSIS — F1120 Opioid dependence, uncomplicated: Secondary | ICD-10-CM | POA: Diagnosis not present

## 2020-12-27 DIAGNOSIS — F1121 Opioid dependence, in remission: Secondary | ICD-10-CM | POA: Diagnosis not present

## 2020-12-27 DIAGNOSIS — Z79891 Long term (current) use of opiate analgesic: Secondary | ICD-10-CM | POA: Diagnosis not present

## 2021-01-03 DIAGNOSIS — Z1159 Encounter for screening for other viral diseases: Secondary | ICD-10-CM | POA: Diagnosis not present

## 2021-01-10 DIAGNOSIS — Z79899 Other long term (current) drug therapy: Secondary | ICD-10-CM | POA: Diagnosis not present

## 2021-01-10 DIAGNOSIS — F1121 Opioid dependence, in remission: Secondary | ICD-10-CM | POA: Diagnosis not present

## 2021-01-10 DIAGNOSIS — F1120 Opioid dependence, uncomplicated: Secondary | ICD-10-CM | POA: Diagnosis not present

## 2021-01-10 DIAGNOSIS — Z79891 Long term (current) use of opiate analgesic: Secondary | ICD-10-CM | POA: Diagnosis not present

## 2021-01-23 DIAGNOSIS — R5383 Other fatigue: Secondary | ICD-10-CM | POA: Diagnosis not present

## 2021-01-23 DIAGNOSIS — R202 Paresthesia of skin: Secondary | ICD-10-CM | POA: Diagnosis not present

## 2021-01-23 DIAGNOSIS — M6289 Other specified disorders of muscle: Secondary | ICD-10-CM | POA: Diagnosis not present

## 2021-01-24 DIAGNOSIS — F1120 Opioid dependence, uncomplicated: Secondary | ICD-10-CM | POA: Diagnosis not present

## 2021-01-24 DIAGNOSIS — Z79891 Long term (current) use of opiate analgesic: Secondary | ICD-10-CM | POA: Diagnosis not present

## 2021-01-24 DIAGNOSIS — Z79899 Other long term (current) drug therapy: Secondary | ICD-10-CM | POA: Diagnosis not present

## 2021-01-24 DIAGNOSIS — F1121 Opioid dependence, in remission: Secondary | ICD-10-CM | POA: Diagnosis not present

## 2021-02-07 DIAGNOSIS — F1121 Opioid dependence, in remission: Secondary | ICD-10-CM | POA: Diagnosis not present

## 2021-02-07 DIAGNOSIS — Z79899 Other long term (current) drug therapy: Secondary | ICD-10-CM | POA: Diagnosis not present

## 2021-02-07 DIAGNOSIS — F1120 Opioid dependence, uncomplicated: Secondary | ICD-10-CM | POA: Diagnosis not present

## 2021-02-07 DIAGNOSIS — Z79891 Long term (current) use of opiate analgesic: Secondary | ICD-10-CM | POA: Diagnosis not present

## 2021-02-21 DIAGNOSIS — Z79891 Long term (current) use of opiate analgesic: Secondary | ICD-10-CM | POA: Diagnosis not present

## 2021-02-21 DIAGNOSIS — F1121 Opioid dependence, in remission: Secondary | ICD-10-CM | POA: Diagnosis not present

## 2021-02-21 DIAGNOSIS — F1120 Opioid dependence, uncomplicated: Secondary | ICD-10-CM | POA: Diagnosis not present

## 2021-02-21 DIAGNOSIS — Z79899 Other long term (current) drug therapy: Secondary | ICD-10-CM | POA: Diagnosis not present

## 2021-03-07 DIAGNOSIS — F1120 Opioid dependence, uncomplicated: Secondary | ICD-10-CM | POA: Diagnosis not present

## 2021-03-07 DIAGNOSIS — F1121 Opioid dependence, in remission: Secondary | ICD-10-CM | POA: Diagnosis not present

## 2021-03-07 DIAGNOSIS — Z79891 Long term (current) use of opiate analgesic: Secondary | ICD-10-CM | POA: Diagnosis not present

## 2021-03-07 DIAGNOSIS — Z79899 Other long term (current) drug therapy: Secondary | ICD-10-CM | POA: Diagnosis not present

## 2021-03-21 DIAGNOSIS — Z79891 Long term (current) use of opiate analgesic: Secondary | ICD-10-CM | POA: Diagnosis not present

## 2021-03-21 DIAGNOSIS — F1121 Opioid dependence, in remission: Secondary | ICD-10-CM | POA: Diagnosis not present

## 2021-03-21 DIAGNOSIS — Z79899 Other long term (current) drug therapy: Secondary | ICD-10-CM | POA: Diagnosis not present

## 2021-03-21 DIAGNOSIS — F1120 Opioid dependence, uncomplicated: Secondary | ICD-10-CM | POA: Diagnosis not present

## 2021-04-04 DIAGNOSIS — Z79891 Long term (current) use of opiate analgesic: Secondary | ICD-10-CM | POA: Diagnosis not present

## 2021-04-18 DIAGNOSIS — F1120 Opioid dependence, uncomplicated: Secondary | ICD-10-CM | POA: Diagnosis not present

## 2021-04-18 DIAGNOSIS — Z79891 Long term (current) use of opiate analgesic: Secondary | ICD-10-CM | POA: Diagnosis not present

## 2021-04-18 DIAGNOSIS — Z79899 Other long term (current) drug therapy: Secondary | ICD-10-CM | POA: Diagnosis not present

## 2021-04-18 DIAGNOSIS — F1121 Opioid dependence, in remission: Secondary | ICD-10-CM | POA: Diagnosis not present

## 2021-05-15 DIAGNOSIS — L600 Ingrowing nail: Secondary | ICD-10-CM | POA: Diagnosis not present

## 2021-05-22 DIAGNOSIS — L600 Ingrowing nail: Secondary | ICD-10-CM | POA: Diagnosis not present

## 2021-06-03 DIAGNOSIS — G894 Chronic pain syndrome: Secondary | ICD-10-CM | POA: Diagnosis not present

## 2021-06-03 DIAGNOSIS — M5030 Other cervical disc degeneration, unspecified cervical region: Secondary | ICD-10-CM | POA: Diagnosis not present

## 2021-06-03 DIAGNOSIS — M25511 Pain in right shoulder: Secondary | ICD-10-CM | POA: Diagnosis not present

## 2021-06-03 DIAGNOSIS — M47812 Spondylosis without myelopathy or radiculopathy, cervical region: Secondary | ICD-10-CM | POA: Diagnosis not present

## 2021-07-03 DIAGNOSIS — M75101 Unspecified rotator cuff tear or rupture of right shoulder, not specified as traumatic: Secondary | ICD-10-CM | POA: Diagnosis not present

## 2021-07-03 DIAGNOSIS — Z79891 Long term (current) use of opiate analgesic: Secondary | ICD-10-CM | POA: Diagnosis not present

## 2021-07-03 DIAGNOSIS — M5136 Other intervertebral disc degeneration, lumbar region: Secondary | ICD-10-CM | POA: Diagnosis not present

## 2021-07-03 DIAGNOSIS — G894 Chronic pain syndrome: Secondary | ICD-10-CM | POA: Diagnosis not present

## 2021-07-03 DIAGNOSIS — M5030 Other cervical disc degeneration, unspecified cervical region: Secondary | ICD-10-CM | POA: Diagnosis not present

## 2021-07-06 DIAGNOSIS — L089 Local infection of the skin and subcutaneous tissue, unspecified: Secondary | ICD-10-CM | POA: Diagnosis not present

## 2021-07-06 DIAGNOSIS — W57XXXA Bitten or stung by nonvenomous insect and other nonvenomous arthropods, initial encounter: Secondary | ICD-10-CM | POA: Diagnosis not present

## 2021-07-06 DIAGNOSIS — R600 Localized edema: Secondary | ICD-10-CM | POA: Diagnosis not present

## 2021-07-06 DIAGNOSIS — S90869A Insect bite (nonvenomous), unspecified foot, initial encounter: Secondary | ICD-10-CM | POA: Diagnosis not present

## 2021-08-01 DIAGNOSIS — M5136 Other intervertebral disc degeneration, lumbar region: Secondary | ICD-10-CM | POA: Diagnosis not present

## 2021-08-01 DIAGNOSIS — M75101 Unspecified rotator cuff tear or rupture of right shoulder, not specified as traumatic: Secondary | ICD-10-CM | POA: Diagnosis not present

## 2021-08-01 DIAGNOSIS — M5030 Other cervical disc degeneration, unspecified cervical region: Secondary | ICD-10-CM | POA: Diagnosis not present

## 2021-08-01 DIAGNOSIS — G894 Chronic pain syndrome: Secondary | ICD-10-CM | POA: Diagnosis not present

## 2021-08-29 DIAGNOSIS — M5136 Other intervertebral disc degeneration, lumbar region: Secondary | ICD-10-CM | POA: Diagnosis not present

## 2021-08-29 DIAGNOSIS — G894 Chronic pain syndrome: Secondary | ICD-10-CM | POA: Diagnosis not present

## 2021-08-29 DIAGNOSIS — M5030 Other cervical disc degeneration, unspecified cervical region: Secondary | ICD-10-CM | POA: Diagnosis not present

## 2021-08-29 DIAGNOSIS — M75101 Unspecified rotator cuff tear or rupture of right shoulder, not specified as traumatic: Secondary | ICD-10-CM | POA: Diagnosis not present

## 2021-09-29 DIAGNOSIS — Z9181 History of falling: Secondary | ICD-10-CM | POA: Diagnosis not present

## 2021-09-29 DIAGNOSIS — M79652 Pain in left thigh: Secondary | ICD-10-CM | POA: Diagnosis not present

## 2021-09-29 DIAGNOSIS — T148XXA Other injury of unspecified body region, initial encounter: Secondary | ICD-10-CM | POA: Diagnosis not present

## 2021-09-29 DIAGNOSIS — F319 Bipolar disorder, unspecified: Secondary | ICD-10-CM | POA: Diagnosis not present

## 2021-09-29 DIAGNOSIS — E669 Obesity, unspecified: Secondary | ICD-10-CM | POA: Diagnosis not present

## 2021-10-01 DIAGNOSIS — G894 Chronic pain syndrome: Secondary | ICD-10-CM | POA: Diagnosis not present

## 2021-10-01 DIAGNOSIS — Z79891 Long term (current) use of opiate analgesic: Secondary | ICD-10-CM | POA: Diagnosis not present

## 2021-10-01 DIAGNOSIS — M5030 Other cervical disc degeneration, unspecified cervical region: Secondary | ICD-10-CM | POA: Diagnosis not present

## 2021-10-01 DIAGNOSIS — M5136 Other intervertebral disc degeneration, lumbar region: Secondary | ICD-10-CM | POA: Diagnosis not present

## 2021-10-01 DIAGNOSIS — M47812 Spondylosis without myelopathy or radiculopathy, cervical region: Secondary | ICD-10-CM | POA: Diagnosis not present

## 2021-10-17 DIAGNOSIS — L981 Factitial dermatitis: Secondary | ICD-10-CM | POA: Diagnosis not present

## 2021-11-04 ENCOUNTER — Emergency Department (HOSPITAL_COMMUNITY): Payer: No Typology Code available for payment source

## 2021-11-04 ENCOUNTER — Emergency Department (HOSPITAL_COMMUNITY)
Admission: EM | Admit: 2021-11-04 | Discharge: 2021-11-05 | Disposition: A | Discharge status: Home / Self Care | Payer: No Typology Code available for payment source | Attending: Emergency Medicine | Admitting: Emergency Medicine

## 2021-11-04 ENCOUNTER — Other Ambulatory Visit: Payer: Self-pay

## 2021-11-04 DIAGNOSIS — Z7982 Long term (current) use of aspirin: Secondary | ICD-10-CM | POA: Diagnosis not present

## 2021-11-04 DIAGNOSIS — Z79899 Other long term (current) drug therapy: Secondary | ICD-10-CM | POA: Insufficient documentation

## 2021-11-04 DIAGNOSIS — G20 Parkinson's disease: Secondary | ICD-10-CM | POA: Insufficient documentation

## 2021-11-04 DIAGNOSIS — Z87891 Personal history of nicotine dependence: Secondary | ICD-10-CM | POA: Insufficient documentation

## 2021-11-04 DIAGNOSIS — R42 Dizziness and giddiness: Secondary | ICD-10-CM | POA: Insufficient documentation

## 2021-11-04 DIAGNOSIS — R531 Weakness: Secondary | ICD-10-CM | POA: Insufficient documentation

## 2021-11-04 DIAGNOSIS — R0602 Shortness of breath: Secondary | ICD-10-CM | POA: Insufficient documentation

## 2021-11-04 DIAGNOSIS — R5383 Other fatigue: Secondary | ICD-10-CM | POA: Diagnosis not present

## 2021-11-04 LAB — COMPREHENSIVE METABOLIC PANEL
ALT: 11 U/L (ref 0–44)
AST: 20 U/L (ref 15–41)
Albumin: 3.7 g/dL (ref 3.5–5.0)
Alkaline Phosphatase: 78 U/L (ref 38–126)
Anion gap: 6 (ref 5–15)
BUN: 17 mg/dL (ref 8–23)
CO2: 25 mmol/L (ref 22–32)
Calcium: 8.6 mg/dL — ABNORMAL LOW (ref 8.9–10.3)
Chloride: 104 mmol/L (ref 98–111)
Creatinine, Ser: 0.96 mg/dL (ref 0.61–1.24)
GFR, Estimated: >60 mL/min (ref >60)
Glucose, Bld: 184 mg/dL — ABNORMAL HIGH (ref 70–99)
Potassium: 4.2 mmol/L (ref 3.5–5.1)
Sodium: 135 mmol/L (ref 135–145)
Total Bilirubin: 0.9 mg/dL (ref 0.3–1.2)
Total Protein: 7.6 g/dL (ref 6.5–8.1)

## 2021-11-04 LAB — BLOOD GAS, VENOUS
Acid-base deficit: 0.9 mmol/L (ref 0.0–2.0)
Bicarbonate: 25.4 mmol/L (ref 20.0–28.0)
O2 Saturation: 85.9 %
Patient temperature: 98.6
pCO2, Ven: 51.2 mmHg (ref 44.0–60.0)
pH, Ven: 7.316 (ref 7.250–7.430)
pO2, Ven: 58.3 mmHg — ABNORMAL HIGH (ref 32.0–45.0)

## 2021-11-04 LAB — CBC WITH DIFFERENTIAL/PLATELET
Abs Immature Granulocytes: 0.12 10*3/uL — ABNORMAL HIGH (ref 0.00–0.07)
Basophils Absolute: 0.1 10*3/uL (ref 0.0–0.1)
Basophils Relative: 1 %
Eosinophils Absolute: 0.3 10*3/uL (ref 0.0–0.5)
Eosinophils Relative: 3 %
HCT: 41.9 % (ref 39.0–52.0)
Hemoglobin: 13.6 g/dL (ref 13.0–17.0)
Immature Granulocytes: 1 %
Lymphocytes Relative: 22 %
Lymphs Abs: 2.2 10*3/uL (ref 0.7–4.0)
MCH: 29.6 pg (ref 26.0–34.0)
MCHC: 32.5 g/dL (ref 30.0–36.0)
MCV: 91.1 fL (ref 80.0–100.0)
Monocytes Absolute: 0.8 10*3/uL (ref 0.1–1.0)
Monocytes Relative: 8 %
Neutro Abs: 6.4 10*3/uL (ref 1.7–7.7)
Neutrophils Relative %: 65 %
Platelets: 238 10*3/uL (ref 150–400)
RBC: 4.6 MIL/uL (ref 4.22–5.81)
RDW: 15.4 % (ref 11.5–15.5)
WBC: 9.8 10*3/uL (ref 4.0–10.5)
nRBC: 0 % (ref 0.0–0.2)

## 2021-11-04 NOTE — ED Triage Notes (Signed)
Patient is from home brought in by EMS. Patient reports weakness and nausea for 2 days. Patient has history of Parkinson but has more tremors and shuffling than usual. Patient has active shingles lesions.

## 2021-11-04 NOTE — ED Provider Notes (Signed)
[facility]-EMERGENCY DEPT Provider Note   CSN: [id number] Arrival date & time: 11/04/21  2239     History Chief Complaint  Patient presents with   Weakness   Dizziness    Alan Brown is a 70 y.o. male.  The history is provided by the patient and medical records.  Weakness Associated symptoms: dizziness   Dizziness Associated symptoms: weakness   Alan Brown is a 70 y.o. male who presents to the Emergency Department complaining of weakness.  He presents to the ED by EMS for evaluation of generalized weakness that started yesterday. He states that he started feeling poorly yesterday with generalized weakness. About 2 to 3 weeks ago he developed shingles infection to the left posterior scalp and neck. He was seen at the [facility] for this and treated with medications. He states that overall the pain and shingles is improving. He did go to the [place] on Friday and had a refill on all of his medications. He is on chronic pain medications and states of his doses have not been change recently. His weakness does not localize. He denies any headache, chest pain, difficulty breathing, abdominal pain, nausea, vomiting, diarrhea, dysuria. He lives with his wife and three children. He usually ambulates without assistance. He does not have an oxygen requirement at home.    Past Medical History:  Diagnosis Date   Anxiety    Arthritis    "all through my body" (01/26/2014)   Bipolar 1 disorder (HCC)    Chronic lower back pain    Depression    Depression    GERD (gastroesophageal reflux disease)    Pneumonia 1952; 11/2013   Prolapse, disk    lower back   Stroke [facility])    pt reported TIA    Patient Active Problem List   Diagnosis Date Noted   S/P right rotator cuff repair 08/10/2019   Impingement syndrome of right shoulder    Acute respiratory failure with hypoxia (HCC) 04/23/2019   Tear of right supraspinatus tendon 03/23/2019   Arthrosis of right  acromioclavicular joint 03/23/2019   Depression 01/24/2014   Polysubstance (including opioids) dependence, daily use (HCC) 01/24/2014   Chronic pain syndrome 01/24/2014   Sepsis (HCC) 01/23/2014   Altered mental status 01/23/2014   Acute encephalopathy 11/19/2013   SIRS (systemic inflammatory response syndrome) (HCC) 11/19/2013   Community acquired pneumonia 11/19/2013   GERD (gastroesophageal reflux disease) 11/19/2013   Bipolar 1 disorder (HCC) 11/19/2013   Lactic acidosis 11/19/2013   Pneumonia 11/19/2013    Past Surgical History:  Procedure Laterality Date   APPENDECTOMY  1961   INGUINAL HERNIA REPAIR Left 1998   SHOULDER ARTHROSCOPY WITH ROTATOR CUFF REPAIR AND SUBACROMIAL DECOMPRESSION Right 06/29/2019   Procedure: RIGHT SHOULDER ARTHROSCOPY WITH ROTATOR CUFF REPAIR, BICEPS TENO SUBACROMIAL DECOMPRESSION, DISTAL CLAVICLE EXCISION, EXTENSIVE DEBRIDEMENT;  Surgeon: Tarry Kos, MD;  Location: [facility];  Service: Orthopedics;  Laterality: Right;   TONSILLECTOMY  1976       Family History  Problem Relation Age of Onset   Hypertension Mother    Hypertension Father    Aneurysm Father    Other Sister        killed   Diabetes Sister    Heart disease Sister     Social History   Tobacco Use   Smoking status: Former    Packs/day: 1.00    Years: 25.00    Pack years: 25.00    Types: Cigarettes   Smokeless tobacco:  Never   Tobacco comments:    01/26/2014 "quit smoking 15-16 yr ago"  Vaping Use   Vaping Use: Never used  Substance Use Topics   Alcohol use: No    Comment:  "quit drinking 25 yr ago; I'm a recovering alcoholic"   Drug use: No    Home Medications Prior to Admission medications   Medication Sig Start Date End Date Taking? Authorizing Provider  albuterol (VENTOLIN HFA) 108 (90 Base) MCG/ACT inhaler Inhale 2 puffs into the lungs 4 (four) times daily as needed for wheezing or shortness of breath.     [provider]  aspirin EC 81  MG tablet Take 81 mg by mouth daily.    [provider]  atorvastatin (LIPITOR) 20 MG tablet Take 20 mg by mouth at bedtime.     [provider]  busPIRone (BUSPAR) 15 MG tablet Take 15 mg by mouth 2 (two) times daily.    [provider]  Calcium Carbonate-Vitamin D (CALCIUM 600+D) 600-400 MG-UNIT tablet Take 1 tablet by mouth 2 (two) times daily.    [provider]  carbidopa-levodopa (SINEMET IR) 25-100 MG tablet Take 1 tablet by mouth 3 (three) times daily. Take 1 tablet at 8am/1pm/5pm    [provider]  divalproex (DEPAKOTE ER) 500 MG 24 hr tablet Take 500 mg by mouth every 12 (twelve) hours.    [provider]  DULoxetine (CYMBALTA) 60 MG capsule Take 60 mg by mouth daily. For mood and pain    [provider]  ferrous sulfate 325 (65 FE) MG tablet Take 325 mg by mouth every Monday, Wednesday, and Friday.    [provider]  memantine (NAMENDA) 10 MG tablet Take 20 mg by mouth at bedtime.    [provider]  methocarbamol (ROBAXIN) 500 MG tablet Take 1,000 mg by mouth 2 (two) times daily.    [provider]  morphine (MS CONTIN) 15 MG 12 hr tablet Take 1 tablet by mouth in the morning and at bedtime. 08/09/20   [provider]  Multiple Vitamin (MULTIVITAMIN WITH MINERALS) TABS tablet Take 1 tablet by mouth daily.    [provider]  omeprazole (PRILOSEC) 40 MG capsule Take 40 mg by mouth 2 (two) times daily before a meal.    [provider]  Oxycodone HCl 20 MG TABS Take 1 tablet by mouth 5 (five) times daily. 08/09/20   [provider]  pregabalin (LYRICA) 300 MG capsule Take 300 mg by mouth at bedtime.    [provider]  primidone (MYSOLINE) 50 MG tablet Take 50 mg by mouth 3 (three) times daily. For tremors    [provider]  promethazine (PHENERGAN) 25 MG tablet Take 1 tablet (25 mg total) by mouth every 6 (six) hours as needed for nausea. 06/29/19    Tarry Kos, MD  propranolol ER (INDERAL LA) 60 MG 24 hr capsule Take 60 mg by mouth daily.    [provider]  risperiDONE (RISPERDAL) 0.5 MG tablet Take 1 tablet (0.5 mg total) by mouth at bedtime as needed for up to 30 days (for agitation). Patient taking differently: Take 0.5 mg by mouth daily.  04/27/19 08/15/20  Amin, Loura Halt, MD  tamsulosin (FLOMAX) 0.4 MG CAPS capsule Take 0.4 mg by mouth at bedtime.     [provider]  Tapentadol HCl (NUCYNTA) 100 MG TABS Take 150 mg by mouth 4 (four) times daily as needed (PAIN).     [provider]  Tiotropium Bromide Monohydrate (SPIRIVA RESPIMAT) 2.5 MCG/ACT AERS Inhale 2 puffs into the lungs daily.    [provider]  trifluoperazine (STELAZINE) 1 MG tablet Take 3 mg by mouth at bedtime.    [provider]    Allergies    Patient has no known allergies.  Review of Systems   Review of Systems  Neurological:  Positive for dizziness and weakness.  All other systems reviewed and are negative.  Physical Exam Updated Vital Signs BP 125/75   Pulse 69   Temp (!) 97.3 F (36.3 C) (Oral)   Resp 16   Ht 5\' 10"  (1.778 m)   Wt 104.3 kg   SpO2 98%   BMI 33.00 kg/m   Physical Exam Vitals and nursing note reviewed.  Constitutional:      Appearance: Normal appearance. He is well-developed.  HENT:     Head: Normocephalic and atraumatic.     Comments: Scabbed over lesions to posterior left scalp and posterior left neck Cardiovascular:     Rate and Rhythm: Normal rate and regular rhythm.     Heart sounds: No murmur heard. Pulmonary:     Effort: Pulmonary effort is normal. No respiratory distress.     Breath sounds: Normal breath sounds.  Abdominal:     Palpations: Abdomen is soft.     Tenderness: There is no abdominal tenderness. There is no guarding or rebound.  Musculoskeletal:        General: No tenderness.     Comments: 1+ pitting edema to BLE  Skin:    General: Skin is warm and dry.   Neurological:     Mental Status: He is oriented to person, place, and time.     Comments: 5/5 strength in all four extremities.  Slow to answer questions.  Appears fatigued.    Psychiatric:        Behavior: Behavior normal.    ED Results / Procedures / Treatments   Labs (all labs ordered are listed, but only abnormal results are displayed) Labs Reviewed  BLOOD GAS, VENOUS - Abnormal; Notable for the following components:      Result Value   pO2, Ven 58.3 (*)    All other components within normal limits  COMPREHENSIVE METABOLIC PANEL - Abnormal; Notable for the following components:   Glucose, Bld 184 (*)    Calcium 8.6 (*)    All other components within normal limits  CBC WITH DIFFERENTIAL/PLATELET - Abnormal; Notable for the following components:   Abs Immature Granulocytes 0.12 (*)    All other components within normal limits  URINALYSIS, ROUTINE W REFLEX MICROSCOPIC  BRAIN NATRIURETIC PEPTIDE  TROPONIN I (HIGH SENSITIVITY)    EKG EKG Interpretation  Date/Time:  Monday November 04 2021 22:56:00 EST Ventricular Rate:  73 PR Interval:  190 QRS Duration: 97 QT Interval:  423 QTC Calculation: 467 R Axis:     Text Interpretation: Q wave inversion in II, III, aVF new when compared to prior in 04/2019 Confirmed by 05/2019 [id number]) on 11/04/2021 11:53:23 PM  Radiology CT Head Wo Contrast  Result Date: 11/05/2021 CLINICAL DATA:  Altered mental status EXAM: CT HEAD WITHOUT CONTRAST TECHNIQUE: Contiguous axial images were obtained from the base of the skull through the vertex without intravenous contrast. COMPARISON:  03/07/2020 FINDINGS: Brain: No evidence of acute infarction, hemorrhage, hydrocephalus, extra-axial collection or mass lesion/mass effect. Vascular: No hyperdense vessel or unexpected calcification. Skull: Normal. Negative for fracture or focal lesion. Sinuses/Orbits: No acute finding. Other: None. IMPRESSION: No  acute intracranial abnormality noted.  Electronically Signed   By: Alcide Clever M.D.   On: 11/05/2021 00:07   DG Chest Port 1 View  Result Date: 11/05/2021 CLINICAL DATA:  Weakness EXAM: PORTABLE CHEST 1 VIEW COMPARISON:  05/10/2020 FINDINGS: Cardiac shadow is within normal limits. Aortic calcifications are seen. The lungs are well aerated without focal infiltrate or effusion. No bony abnormality is noted. IMPRESSION: No acute abnormality noted. Electronically Signed   By: Alcide Clever M.D.   On: 11/05/2021 00:13    Procedures Procedures   Medications Ordered in ED Medications - No data to display  ED Course  I have reviewed the triage vital signs and the nursing notes.  Pertinent labs & imaging results that were available during my care of the patient were reviewed by me and considered in my medical decision making (see chart for details).    MDM Rules/Calculators/A&P                           patient with history of chronic pain, TIA, recent shingles infection here for evaluation of generalized weakness, fatigue. On evaluation he is non-toxic appearing with no focal neurologic deficits. Examination is significant for healing rash consistent with of resolving herpes zoster. CT head is negative for acute abnormality. No evidence of pneumonia. CBC without significant anemia. Electrolytes are within normal limits. UA is not consistent with UTI. EKG does demonstrate q waves and inferior leads (does not appear acute), troponin and BMP are within normal limits, patient without current chest pain or dyspnea. Presentation is not consistent with PE, ACS. Discussed with patient unclear source of symptoms. Plan to discharge home with close outpatient follow-up and return precautions. Final Clinical Impression(s) / ED Diagnoses Final diagnoses:  Generalized weakness  Fatigue, unspecified type    Rx / DC Orders ED Discharge Orders     None        Tilden Fossa, MD 11/05/21 [phone]

## 2021-11-05 ENCOUNTER — Emergency Department (HOSPITAL_COMMUNITY): Payer: No Typology Code available for payment source

## 2021-11-05 ENCOUNTER — Encounter (HOSPITAL_COMMUNITY): Payer: Self-pay | Admitting: Emergency Medicine

## 2021-11-05 LAB — URINALYSIS, ROUTINE W REFLEX MICROSCOPIC
Bilirubin Urine: NEGATIVE
Glucose, UA: NEGATIVE mg/dL
Hgb urine dipstick: NEGATIVE
Ketones, ur: NEGATIVE mg/dL
Leukocytes,Ua: NEGATIVE
Nitrite: NEGATIVE
Protein, ur: NEGATIVE mg/dL
Specific Gravity, Urine: 1.021 (ref 1.005–1.030)
pH: 5 (ref 5.0–8.0)

## 2021-11-05 LAB — TROPONIN I (HIGH SENSITIVITY): Troponin I (High Sensitivity): 5 ng/L (ref <18)

## 2021-11-05 LAB — BRAIN NATRIURETIC PEPTIDE: B Natriuretic Peptide: 40.2 pg/mL (ref 0.0–100.0)

## 2021-11-05 NOTE — ED Notes (Addendum)
Pt able to ambulate.  

## 2021-11-05 NOTE — Discharge Instructions (Signed)
The cause of your symptoms was not identified today. Consider decreasing your pain medication as this may be contributing to your fatigue. Get rechecked if you develop any new or concerning symptoms.

## 2021-11-13 DIAGNOSIS — R531 Weakness: Secondary | ICD-10-CM | POA: Diagnosis not present

## 2021-11-13 DIAGNOSIS — B029 Zoster without complications: Secondary | ICD-10-CM | POA: Diagnosis not present

## 2021-11-25 DIAGNOSIS — J069 Acute upper respiratory infection, unspecified: Secondary | ICD-10-CM | POA: Diagnosis not present

## 2021-11-25 DIAGNOSIS — Z9189 Other specified personal risk factors, not elsewhere classified: Secondary | ICD-10-CM | POA: Diagnosis not present

## 2021-11-25 DIAGNOSIS — Z1152 Encounter for screening for COVID-19: Secondary | ICD-10-CM | POA: Diagnosis not present

## 2021-11-28 DIAGNOSIS — M47812 Spondylosis without myelopathy or radiculopathy, cervical region: Secondary | ICD-10-CM | POA: Diagnosis not present

## 2021-11-28 DIAGNOSIS — Z79891 Long term (current) use of opiate analgesic: Secondary | ICD-10-CM | POA: Diagnosis not present

## 2021-11-28 DIAGNOSIS — M75101 Unspecified rotator cuff tear or rupture of right shoulder, not specified as traumatic: Secondary | ICD-10-CM | POA: Diagnosis not present

## 2021-11-28 DIAGNOSIS — M5136 Other intervertebral disc degeneration, lumbar region: Secondary | ICD-10-CM | POA: Diagnosis not present

## 2021-11-28 DIAGNOSIS — M5030 Other cervical disc degeneration, unspecified cervical region: Secondary | ICD-10-CM | POA: Diagnosis not present

## 2021-12-02 DIAGNOSIS — R059 Cough, unspecified: Secondary | ICD-10-CM | POA: Diagnosis not present

## 2021-12-04 DIAGNOSIS — R059 Cough, unspecified: Secondary | ICD-10-CM | POA: Diagnosis not present

## 2021-12-04 DIAGNOSIS — R062 Wheezing: Secondary | ICD-10-CM | POA: Diagnosis not present

## 2021-12-13 ENCOUNTER — Ambulatory Visit
Admission: RE | Admit: 2021-12-13 | Discharge: 2021-12-13 | Disposition: A | Discharge status: Home / Self Care | Payer: Medicare PPO | Source: Ambulatory Visit | Attending: Family Medicine | Admitting: Family Medicine

## 2021-12-13 ENCOUNTER — Other Ambulatory Visit: Payer: Self-pay | Admitting: Family Medicine

## 2021-12-13 DIAGNOSIS — R0602 Shortness of breath: Secondary | ICD-10-CM

## 2021-12-13 DIAGNOSIS — R110 Nausea: Secondary | ICD-10-CM | POA: Diagnosis not present

## 2021-12-13 DIAGNOSIS — R059 Cough, unspecified: Secondary | ICD-10-CM | POA: Diagnosis not present

## 2021-12-17 DIAGNOSIS — K5900 Constipation, unspecified: Secondary | ICD-10-CM | POA: Diagnosis not present

## 2021-12-17 DIAGNOSIS — K140 Glossitis: Secondary | ICD-10-CM | POA: Diagnosis not present

## 2021-12-17 DIAGNOSIS — R110 Nausea: Secondary | ICD-10-CM | POA: Diagnosis not present

## 2021-12-22 ENCOUNTER — Emergency Department (HOSPITAL_BASED_OUTPATIENT_CLINIC_OR_DEPARTMENT_OTHER): Payer: No Typology Code available for payment source

## 2021-12-22 ENCOUNTER — Other Ambulatory Visit: Payer: Self-pay

## 2021-12-22 ENCOUNTER — Emergency Department (HOSPITAL_BASED_OUTPATIENT_CLINIC_OR_DEPARTMENT_OTHER)
Admission: EM | Admit: 2021-12-22 | Discharge: 2021-12-22 | Disposition: A | Discharge status: Home / Self Care | Payer: No Typology Code available for payment source | Attending: Emergency Medicine | Admitting: Emergency Medicine

## 2021-12-22 ENCOUNTER — Encounter (HOSPITAL_BASED_OUTPATIENT_CLINIC_OR_DEPARTMENT_OTHER): Payer: Self-pay

## 2021-12-22 ENCOUNTER — Emergency Department (HOSPITAL_BASED_OUTPATIENT_CLINIC_OR_DEPARTMENT_OTHER): Payer: No Typology Code available for payment source | Admitting: Radiology

## 2021-12-22 DIAGNOSIS — R160 Hepatomegaly, not elsewhere classified: Secondary | ICD-10-CM | POA: Insufficient documentation

## 2021-12-22 DIAGNOSIS — Z20822 Contact with and (suspected) exposure to covid-19: Secondary | ICD-10-CM | POA: Diagnosis not present

## 2021-12-22 DIAGNOSIS — Z7982 Long term (current) use of aspirin: Secondary | ICD-10-CM | POA: Diagnosis not present

## 2021-12-22 DIAGNOSIS — J189 Pneumonia, unspecified organism: Secondary | ICD-10-CM | POA: Insufficient documentation

## 2021-12-22 DIAGNOSIS — K760 Fatty (change of) liver, not elsewhere classified: Secondary | ICD-10-CM | POA: Diagnosis not present

## 2021-12-22 DIAGNOSIS — R109 Unspecified abdominal pain: Secondary | ICD-10-CM

## 2021-12-22 DIAGNOSIS — R509 Fever, unspecified: Secondary | ICD-10-CM | POA: Diagnosis present

## 2021-12-22 DIAGNOSIS — K5730 Diverticulosis of large intestine without perforation or abscess without bleeding: Secondary | ICD-10-CM | POA: Diagnosis not present

## 2021-12-22 LAB — CBC
HCT: 39 % (ref 39.0–52.0)
Hemoglobin: 12.2 g/dL — ABNORMAL LOW (ref 13.0–17.0)
MCH: 28.7 pg (ref 26.0–34.0)
MCHC: 31.3 g/dL (ref 30.0–36.0)
MCV: 91.8 fL (ref 80.0–100.0)
Platelets: 242 10*3/uL (ref 150–400)
RBC: 4.25 MIL/uL (ref 4.22–5.81)
RDW: 15.7 % — ABNORMAL HIGH (ref 11.5–15.5)
WBC: 12.5 10*3/uL — ABNORMAL HIGH (ref 4.0–10.5)
nRBC: 0 % (ref 0.0–0.2)

## 2021-12-22 LAB — URINALYSIS, ROUTINE W REFLEX MICROSCOPIC
Bilirubin Urine: NEGATIVE
Glucose, UA: NEGATIVE mg/dL
Hgb urine dipstick: NEGATIVE
Ketones, ur: NEGATIVE mg/dL
Leukocytes,Ua: NEGATIVE
Nitrite: NEGATIVE
Protein, ur: NEGATIVE mg/dL
Specific Gravity, Urine: 1.008 (ref 1.005–1.030)
pH: 5 (ref 5.0–8.0)

## 2021-12-22 LAB — RESP PANEL BY RT-PCR (FLU A&B, COVID) ARPGX2
Influenza A by PCR: NEGATIVE
Influenza B by PCR: NEGATIVE
SARS Coronavirus 2 by RT PCR: NEGATIVE

## 2021-12-22 LAB — COMPREHENSIVE METABOLIC PANEL
ALT: 12 U/L (ref 0–44)
AST: 11 U/L — ABNORMAL LOW (ref 15–41)
Albumin: 3.3 g/dL — ABNORMAL LOW (ref 3.5–5.0)
Alkaline Phosphatase: 67 U/L (ref 38–126)
Anion gap: 7 (ref 5–15)
BUN: 5 mg/dL — ABNORMAL LOW (ref 8–23)
CO2: 32 mmol/L (ref 22–32)
Calcium: 8.9 mg/dL (ref 8.9–10.3)
Chloride: 98 mmol/L (ref 98–111)
Creatinine, Ser: 1.03 mg/dL (ref 0.61–1.24)
GFR, Estimated: >60 mL/min (ref >60)
Glucose, Bld: 141 mg/dL — ABNORMAL HIGH (ref 70–99)
Potassium: 3.9 mmol/L (ref 3.5–5.1)
Sodium: 137 mmol/L (ref 135–145)
Total Bilirubin: 0.3 mg/dL (ref 0.3–1.2)
Total Protein: 7.1 g/dL (ref 6.5–8.1)

## 2021-12-22 LAB — LIPASE, BLOOD: Lipase: 12 U/L (ref 11–51)

## 2021-12-22 MED ORDER — DOXYCYCLINE HYCLATE 100 MG PO CAPS: 100.0000 mg | ORAL_CAPSULE | Freq: Two times a day (BID) | ORAL | 0 refills | Status: DC

## 2021-12-22 MED ORDER — IOHEXOL 300 MG/ML  SOLN
100.0000 mL | Freq: Once | INTRAMUSCULAR | Status: AC | PRN
  Administered 2021-12-22: 100 mL via INTRAVENOUS

## 2021-12-22 MED ORDER — ONDANSETRON 4 MG PO TBDP
8.0000 mg | ORAL_TABLET | Freq: Once | ORAL | Status: AC
Start: 2021-12-22 — End: 2021-12-22
  Administered 2021-12-22: 8 mg via ORAL
  Filled 2021-12-22: qty 2

## 2021-12-22 MED ORDER — SODIUM CHLORIDE 0.9 % IV SOLN
500.0000 mg | Freq: Once | INTRAVENOUS | Status: AC
  Administered 2021-12-22: 500 mg via INTRAVENOUS
  Filled 2021-12-22: qty 5

## 2021-12-22 MED ORDER — ACETAMINOPHEN 500 MG PO TABS
1000.0000 mg | ORAL_TABLET | Freq: Four times a day (QID) | ORAL | Status: DC | PRN
  Administered 2021-12-22: 1000 mg via ORAL
  Filled 2021-12-22: qty 2

## 2021-12-22 MED ORDER — ONDANSETRON 4 MG PO TBDP
4.0000 mg | ORAL_TABLET | Freq: Once | ORAL | Status: AC
  Administered 2021-12-22: 4 mg via ORAL
  Filled 2021-12-22: qty 1

## 2021-12-22 MED ORDER — ONDANSETRON HCL 8 MG PO TABS: 8.0000 mg | ORAL_TABLET | Freq: Three times a day (TID) | ORAL | 0 refills | Status: DC | PRN

## 2021-12-22 MED ORDER — SODIUM CHLORIDE 0.9 % IV SOLN
1.0000 g | Freq: Once | INTRAVENOUS | Status: AC
  Administered 2021-12-22: 1 g via INTRAVENOUS
  Filled 2021-12-22: qty 10

## 2021-12-22 NOTE — ED Notes (Signed)
Pt has tolerated the saltines and ginger ale.  Pt reports that his nausea has subsided at this time

## 2021-12-22 NOTE — ED Provider Notes (Signed)
[facility]   CSN: [id number] Arrival date & time: 12/22/21  1614     History  Chief Complaint  Patient presents with   Abdominal Pain    Alan Brown is a 71 y.o. male with medical history significant for anxiety, bipolar 1 disorder, depression, GERD, stroke.  Patient states beginning today he has had left upper quadrant abdominal pain that comes and goes.  Patient states that the pain is sharp and nothing alleviates or aggravates the pain.  Patient is also not attempted to address symptoms utilizing any over-the-counter medication.  Patient complains of dizziness, headache, nausea, fevers, cough.  Patient states that he has "been sick for 13 days" and has not "had a bowel movement in 8 days".  Patient states that he attempted to have his symptoms addressed at his PCP utilizing a telehealth visit but cannot remember what the results of that appointment was.   Abdominal Pain Associated symptoms: cough, fever and nausea   Associated symptoms: no chest pain and no shortness of breath       Home Medications Prior to Admission medications   Medication Sig Start Date End Date Taking? Authorizing Provider  doxycycline (VIBRAMYCIN) 100 MG capsule Take 1 capsule (100 mg total) by mouth 2 (two) times daily. 12/22/21  Yes Al DecantGroce, Samariah Hokenson F, PA-C  ondansetron (ZOFRAN) 8 MG tablet Take 1 tablet (8 mg total) by mouth every 8 (eight) hours as needed for nausea or vomiting. 12/22/21  Yes Al DecantGroce, Kamaal Cast F, PA-C  albuterol (VENTOLIN HFA) 108 (90 Base) MCG/ACT inhaler Inhale 2 puffs into the lungs 4 (four) times daily as needed for wheezing or shortness of breath.     [provider]  aspirin EC 81 MG tablet Take 81 mg by mouth daily.    [provider]  atorvastatin (LIPITOR) 20 MG tablet Take 20 mg by mouth at bedtime.     [provider]  busPIRone (BUSPAR) 15 MG tablet Take 15 mg by mouth 2 (two) times daily.     [provider]  Calcium Carbonate-Vitamin D (CALCIUM 600+D) 600-400 MG-UNIT tablet Take 1 tablet by mouth 2 (two) times daily.    [provider]  carbidopa-levodopa (SINEMET IR) 25-100 MG tablet Take 1 tablet by mouth 3 (three) times daily. Take 1 tablet at 8am/1pm/5pm    [provider]  divalproex (DEPAKOTE ER) 500 MG 24 hr tablet Take 500 mg by mouth every 12 (twelve) hours.    [provider]  DULoxetine (CYMBALTA) 60 MG capsule Take 60 mg by mouth daily. For mood and pain    [provider]  ferrous sulfate 325 (65 FE) MG tablet Take 325 mg by mouth every Monday, Wednesday, and Friday.    [provider]  memantine (NAMENDA) 10 MG tablet Take 20 mg by mouth at bedtime.    [provider]  methocarbamol (ROBAXIN) 500 MG tablet Take 1,000 mg by mouth 2 (two) times daily.    [provider]  morphine (MS CONTIN) 15 MG 12 hr tablet Take 1 tablet by mouth in the morning and at bedtime. 08/09/20   [provider]  Multiple Vitamin (MULTIVITAMIN WITH MINERALS) TABS tablet Take 1 tablet by mouth daily.    [provider]  omeprazole (PRILOSEC) 40 MG capsule Take 40 mg by mouth 2 (two) times daily before a meal.    [provider]  Oxycodone HCl 20 MG TABS Take 1 tablet by mouth 5 (five) times daily.  08/09/20   [provider]  pregabalin (LYRICA) 300 MG capsule Take 300 mg by mouth at bedtime.    [provider]  primidone (MYSOLINE) 50 MG tablet Take 50 mg by mouth 3 (three) times daily. For tremors    [provider]  promethazine (PHENERGAN) 25 MG tablet Take 1 tablet (25 mg total) by mouth every 6 (six) hours as needed for nausea. 06/29/19   Tarry Kos, MD  propranolol ER (INDERAL LA) 60 MG 24 hr capsule Take 60 mg by mouth daily.    [provider]  risperiDONE (RISPERDAL) 0.5 MG tablet Take 1 tablet (0.5 mg total) by mouth at bedtime as needed for up to 30 days  (for agitation). Patient taking differently: Take 0.5 mg by mouth daily.  04/27/19 08/15/20  Amin, Loura Halt, MD  tamsulosin (FLOMAX) 0.4 MG CAPS capsule Take 0.4 mg by mouth at bedtime.     [provider]  Tapentadol HCl (NUCYNTA) 100 MG TABS Take 150 mg by mouth 4 (four) times daily as needed (PAIN).     [provider]  Tiotropium Bromide Monohydrate (SPIRIVA RESPIMAT) 2.5 MCG/ACT AERS Inhale 2 puffs into the lungs daily.    [provider]  trifluoperazine (STELAZINE) 1 MG tablet Take 3 mg by mouth at bedtime.    [provider]      Allergies    Patient has no known allergies.    Review of Systems   Review of Systems  Constitutional:  Positive for fever.  Respiratory:  Positive for cough. Negative for shortness of breath.   Cardiovascular:  Negative for chest pain.  Gastrointestinal:  Positive for abdominal pain and nausea.  Neurological:  Positive for dizziness and headaches. Negative for weakness.   Physical Exam Updated Vital Signs BP 123/67    Pulse 74    Temp 98.8 F (37.1 C)    Resp 14    Ht 5\' 10"  (1.778 m)    Wt 108 kg Comment: stated per pt   SpO2 100%    BMI 34.15 kg/m  Physical Exam Vitals and nursing note reviewed.  Constitutional:      General: He is not in acute distress.    Appearance: He is obese. He is not ill-appearing, toxic-appearing or diaphoretic.  HENT:     Head: Normocephalic and atraumatic.     Mouth/Throat:     Mouth: Mucous membranes are moist.  Eyes:     Extraocular Movements: Extraocular movements intact.     Pupils: Pupils are equal, round, and reactive to light.  Cardiovascular:     Rate and Rhythm: Normal rate and regular rhythm.  Pulmonary:     Effort: Pulmonary effort is normal.     Breath sounds: Normal breath sounds. No wheezing.  Abdominal:     General: Bowel sounds are normal. There is distension.     Palpations: Abdomen is soft.     Tenderness: There is abdominal tenderness in the left upper  quadrant and left lower quadrant.  Skin:    General: Skin is warm and dry.     Capillary Refill: Capillary refill takes less than 2 seconds.  Neurological:     Mental Status: He is alert and oriented to person, place, and time.     GCS: GCS eye subscore is 4. GCS verbal subscore is 5. GCS motor subscore is 6.     Cranial Nerves: Cranial nerves 2-12 are intact. No cranial nerve deficit.     Sensory: Sensation is  intact.     Motor: Motor function is intact. No weakness or pronator drift.     Coordination: Coordination is intact. Coordination normal. Finger-Nose-Finger Test and Heel to [facility] Test normal. Rapid alternating movements normal.    ED Results / Procedures / Treatments   Labs (all labs ordered are listed, but only abnormal results are displayed) Labs Reviewed  COMPREHENSIVE METABOLIC PANEL - Abnormal; Notable for the following components:      Result Value   Glucose, Bld 141 (*)    BUN 5 (*)    Albumin 3.3 (*)    AST 11 (*)    All other components within normal limits  CBC - Abnormal; Notable for the following components:   WBC 12.5 (*)    Hemoglobin 12.2 (*)    RDW 15.7 (*)    All other components within normal limits  RESP PANEL BY RT-PCR (FLU A&B, COVID) ARPGX2  EXPECTORATED SPUTUM ASSESSMENT W GRAM STAIN, RFLX TO RESP C  LIPASE, BLOOD  URINALYSIS, ROUTINE W REFLEX MICROSCOPIC    EKG EKG Interpretation  Date/Time:  Sunday December 22 2021 20:38:37 EST Ventricular Rate:  75 PR Interval:    QRS Duration: 112 QT Interval:  406 QTC Calculation: 454 R Axis:   69 Text Interpretation: Normal sinus rhythm Baseline wander Non-specific ST-t changes Confirmed by Margarita Grizzle [phone]) on 12/22/2021 9:42:11 PM  Radiology CT Head Wo Contrast  Result Date: 12/22/2021 CLINICAL DATA:  Headache, chronic. New features or increased frequency. EXAM: CT HEAD WITHOUT CONTRAST TECHNIQUE: Contiguous axial images were obtained from the base of the skull through the vertex without  intravenous contrast. COMPARISON:  11/04/2021 FINDINGS: Brain: No evidence of acute infarction, hemorrhage, hydrocephalus, extra-axial collection or mass lesion/mass effect. There is mild diffuse low-attenuation within the subcortical and periventricular white matter compatible with chronic microvascular disease. Prominence of the sulci and ventricles compatible with brain atrophy. Vascular: No hyperdense vessel or unexpected calcification. Skull: Normal. Negative for fracture or focal lesion. Sinuses/Orbits: Paranasal sinuses and mastoid air cells are clear. No acute abnormality. Orbits appear normal. Other: None. IMPRESSION: 1. No acute intracranial abnormalities. 2. Chronic small vessel ischemic disease and brain atrophy. Electronically Signed   By: Signa Kell M.D.   On: 12/22/2021 18:53   CT ABDOMEN PELVIS W CONTRAST  Result Date: 12/22/2021 CLINICAL DATA:  Acute abdominal pain. EXAM: CT ABDOMEN AND PELVIS WITH CONTRAST TECHNIQUE: Multidetector CT imaging of the abdomen and pelvis was performed using the standard protocol following bolus administration of intravenous contrast. CONTRAST:  OMNIPAQUE IOHEXOL 300 MG/ML  SOLN COMPARISON:  None. FINDINGS: Lower chest: Dense consolidation in the dependent right lower lobe. Extensive tree-in-bud and nodular opacities in the dependent right lower lobe and to a lesser extent right middle lobe. Minimal tree in bud opacities in the dependent left lower lobe. No pleural effusion. Hepatobiliary: Hepatic steatosis. Mild hepatomegaly with liver spanning 18.3 cm cranial caudal. No focal hepatic lesion. Gallbladder physiologically distended, no calcified stone. No biliary dilatation. Pancreas: No ductal dilatation or inflammation. Spleen: Normal in size without focal abnormality. Splenules anteriorly Adrenals/Urinary Tract: Normal adrenal glands. No hydronephrosis or perinephric edema. Homogeneous renal enhancement with symmetric excretion on delayed phase imaging.  No visualized renal stone or focal renal lesion. Urinary bladder is physiologically. Equivocal wall thickening about the bladder dome. No perivesicular fat stranding. Stomach/Bowel: Decompressed stomach. Normal positioning of the duodenum and ligament of Treitz. There is no small bowel obstruction or inflammation. Appendix is not confidently visualized, no evidence of appendicitis. Scattered  colonic diverticula without diverticulitis. Moderate colonic stool burden with transverse and sigmoid colonic tortuosity. No colonic wall thickening. Small amount of rectal stool. Vascular/Lymphatic: Aorto bi-iliac atherosclerosis. No aortic aneurysm. No portal venous or mesenteric gas. There is no bulky abdominopelvic adenopathy. Reproductive: Prostate is unremarkable. Other: No free air, free fluid, or intra-abdominal fluid collection. Tiny fat containing umbilical hernia. Musculoskeletal: Unilateral right L5 pars defect without listhesis. Scattered degenerative change throughout the lumbar spine and both hips. There are no acute or suspicious osseous abnormalities. IMPRESSION: 1. Right lower lobe consolidation typical of pneumonia. There are additional tree in bud opacities in the right middle and right lower lobe, also likely infectious. Aspiration is also considered. 2. Equivocal wall thickening about the bladder dome, can be seen with urinary tract infection. 3. Colonic diverticulosis without diverticulitis. Moderate colonic stool burden with transverse and sigmoid colonic tortuosity, suggesting constipation. 4. Hepatic steatosis and mild hepatomegaly. 5. Unilateral right L5 pars defect without listhesis. Aortic Atherosclerosis (ICD10-I70.0). Electronically Signed   By: Narda RutherfordMelanie  Sanford M.D.   On: 12/22/2021 19:13   DG Chest Port 1 View  Result Date: 12/22/2021 CLINICAL DATA:  Abdominal pain. EXAM: PORTABLE CHEST 1 VIEW COMPARISON:  Chest radiograph dated 12/13/2021. FINDINGS: There is diffuse chronic interstitial  coarsening. No focal consolidation, pleural effusion, pneumothorax. The cardiac silhouette is within limits. Atherosclerotic calcification of the aorta. No acute osseous pathology. IMPRESSION: No active cardiopulmonary disease. Electronically Signed   By: Elgie CollardArash  Radparvar M.D.   On: 12/22/2021 21:04    Procedures Procedures    Medications Ordered in ED Medications  acetaminophen (TYLENOL) tablet 1,000 mg (1,000 mg Oral Given 12/22/21 1855)  azithromycin (ZITHROMAX) 500 mg in sodium chloride 0.9 % 250 mL IVPB (500 mg Intravenous New Bag/Given 12/22/21 2134)  ondansetron (ZOFRAN-ODT) disintegrating tablet 8 mg (8 mg Oral Given 12/22/21 1855)  iohexol (OMNIPAQUE) 300 MG/ML solution 100 mL (100 mLs Intravenous Contrast Given 12/22/21 1823)  ondansetron (ZOFRAN-ODT) disintegrating tablet 4 mg (4 mg Oral Given 12/22/21 2131)  cefTRIAXone (ROCEPHIN) 1 g in sodium chloride 0.9 % 100 mL IVPB (1 g Intravenous New Bag/Given 12/22/21 2134)    ED Course/ Medical Decision Making/ A&P                           Medical Decision Making  71 year old male presents with abdominal pain since today.  Patient also states he has been sick for "13 days".  On my initial examination the patient he stated that his abdominal pain is subsided.  Patient is continuously clutching his head and stating that he "is having such a bad headache".  Patient seems extremely anxious.  Patient is afebrile, clear lung sounds bilaterally, oxygen saturation of 96%.  Patient worked up utilizing: CMP, lipase, urinalysis, CBC, CT head without contrast, CT abdomen pelvis with contrast, chest x-ray.  CMP results show elevated glucose to 141, decreased BUN to 5 Patient lipase 12 Patient UA unremarkable Patient CBC elevated WBC 12.5 Patient CT head without contrast shows no intracranial abnormality Patient CT abdomen pelvis with contrast does not show any abdominal pathology however it does show dense consolidation in right lower and right middle  lobe.  These are consistent with pneumonia. Patient chest x-ray is unrevealing for any consolidations, however CT scan has pick this up and we will treat as such.  Patient started on 1 g IV Rocephin as well as 500 mg azithromycin IV.  I will culture the patients sputum.  Initially, after discussing  with Dr. Rosalia Hammers, we felt this patient would be appropriate for inpatient management and admission due to his new oxygen requirement.  However, after discussing it, we feel that this patient is appropriate to go home.  This patient is showing the ability to maintain his oxygen saturation at 96%.  This patient will need to complete his course of IV antibiotics prior to discharge.  I have explained to the patient the confusion and his new expected clinical course.  He voices understanding with his clinical course.  I have provided the patient return precautions which he voices understanding with.  I have advised the patient to follow-up with his PCP in the next 3 to 5 days for recheck and new chest x-ray to ensure that his pneumonia has resolved.  I have also advised the patient that I will prescribe him doxycycline.  He voices understanding with both these directions.  Patient is stable on discharge.   Final Clinical Impression(s) / ED Diagnoses Final diagnoses:  Community acquired pneumonia of right lower lobe of lung    Rx / DC Orders ED Discharge Orders          Ordered    doxycycline (VIBRAMYCIN) 100 MG capsule  2 times daily        12/22/21 2224    ondansetron (ZOFRAN) 8 MG tablet  Every 8 hours PRN        12/22/21 2227              Al Decant, PA-C 12/22/21 2231    Margarita Grizzle, MD 12/23/21 1555

## 2021-12-22 NOTE — ED Triage Notes (Signed)
He c/o central abd. Pain, which has been waxing and waning x 2 weeks. He alto tells me "I haven't had a bowel movement in 8 days". He is alert and in no distress.

## 2021-12-22 NOTE — Discharge Instructions (Addendum)
Return to ED with any new or worsening signs or symptoms such as fevers, shortness of breath Please follow-up with your PCP in the next 3 to 5 days for ongoing management and recheck to ensure that pneumonia has resolved I have prescribed you a antibiotic called doxycycline.  Please take this twice a day for the next 5 days.

## 2021-12-22 NOTE — ED Notes (Signed)
Patient given specimen cup for sputum sample. Explained procedure to patient. Patient states he has not been producing any sputum.

## 2021-12-22 NOTE — ED Notes (Signed)
Patient ambulated around unit. Lowest SpO2 91%. PA made aware.

## 2021-12-22 NOTE — ED Notes (Signed)
Pt reports that he is nauseated.  Pt had reported being very hungry earlier and was asking for food.  Offered pt some ginger ale and saltine crackers

## 2021-12-22 NOTE — ED Notes (Signed)
Discussed with pt doing bowel regimen to help with constipation especially since he is on pain medications, he expresses understanding

## 2021-12-22 NOTE — ED Notes (Signed)
ED Provider at bedside. 

## 2021-12-26 DIAGNOSIS — R110 Nausea: Secondary | ICD-10-CM | POA: Diagnosis not present

## 2021-12-26 DIAGNOSIS — I700 Atherosclerosis of aorta: Secondary | ICD-10-CM | POA: Diagnosis not present

## 2021-12-26 DIAGNOSIS — R911 Solitary pulmonary nodule: Secondary | ICD-10-CM | POA: Diagnosis not present

## 2021-12-26 DIAGNOSIS — K760 Fatty (change of) liver, not elsewhere classified: Secondary | ICD-10-CM | POA: Diagnosis not present

## 2021-12-26 DIAGNOSIS — J181 Lobar pneumonia, unspecified organism: Secondary | ICD-10-CM | POA: Diagnosis not present

## 2021-12-27 DIAGNOSIS — M47817 Spondylosis without myelopathy or radiculopathy, lumbosacral region: Secondary | ICD-10-CM | POA: Diagnosis not present

## 2021-12-27 DIAGNOSIS — M47812 Spondylosis without myelopathy or radiculopathy, cervical region: Secondary | ICD-10-CM | POA: Diagnosis not present

## 2021-12-27 DIAGNOSIS — M5030 Other cervical disc degeneration, unspecified cervical region: Secondary | ICD-10-CM | POA: Diagnosis not present

## 2021-12-27 DIAGNOSIS — M5136 Other intervertebral disc degeneration, lumbar region: Secondary | ICD-10-CM | POA: Diagnosis not present

## 2021-12-31 DIAGNOSIS — R110 Nausea: Secondary | ICD-10-CM | POA: Diagnosis not present

## 2021-12-31 DIAGNOSIS — R7309 Other abnormal glucose: Secondary | ICD-10-CM | POA: Diagnosis not present

## 2022-01-01 ENCOUNTER — Encounter: Payer: Self-pay | Admitting: Physician Assistant

## 2022-01-04 ENCOUNTER — Other Ambulatory Visit: Payer: Self-pay

## 2022-01-04 ENCOUNTER — Emergency Department (HOSPITAL_BASED_OUTPATIENT_CLINIC_OR_DEPARTMENT_OTHER): Payer: No Typology Code available for payment source

## 2022-01-04 ENCOUNTER — Encounter (HOSPITAL_BASED_OUTPATIENT_CLINIC_OR_DEPARTMENT_OTHER): Payer: Self-pay

## 2022-01-04 ENCOUNTER — Emergency Department (HOSPITAL_BASED_OUTPATIENT_CLINIC_OR_DEPARTMENT_OTHER)
Admission: EM | Admit: 2022-01-04 | Discharge: 2022-01-04 | Disposition: A | Discharge status: Home / Self Care | Payer: No Typology Code available for payment source | Attending: Emergency Medicine | Admitting: Emergency Medicine

## 2022-01-04 DIAGNOSIS — Z7982 Long term (current) use of aspirin: Secondary | ICD-10-CM | POA: Diagnosis not present

## 2022-01-04 DIAGNOSIS — R21 Rash and other nonspecific skin eruption: Secondary | ICD-10-CM | POA: Diagnosis not present

## 2022-01-04 DIAGNOSIS — R112 Nausea with vomiting, unspecified: Secondary | ICD-10-CM | POA: Diagnosis not present

## 2022-01-04 DIAGNOSIS — R110 Nausea: Secondary | ICD-10-CM

## 2022-01-04 DIAGNOSIS — R1013 Epigastric pain: Secondary | ICD-10-CM | POA: Diagnosis present

## 2022-01-04 LAB — URINALYSIS, ROUTINE W REFLEX MICROSCOPIC
Bilirubin Urine: NEGATIVE
Glucose, UA: >1000 mg/dL — AB
Hgb urine dipstick: NEGATIVE
Ketones, ur: 15 mg/dL — AB
Leukocytes,Ua: NEGATIVE
Nitrite: NEGATIVE
Protein, ur: NEGATIVE mg/dL
Specific Gravity, Urine: 1.015 (ref 1.005–1.030)
pH: 6.5 (ref 5.0–8.0)

## 2022-01-04 LAB — CBC
HCT: 43.4 % (ref 39.0–52.0)
Hemoglobin: 13.7 g/dL (ref 13.0–17.0)
MCH: 28.5 pg (ref 26.0–34.0)
MCHC: 31.6 g/dL (ref 30.0–36.0)
MCV: 90.2 fL (ref 80.0–100.0)
Platelets: 365 10*3/uL (ref 150–400)
RBC: 4.81 MIL/uL (ref 4.22–5.81)
RDW: 16.5 % — ABNORMAL HIGH (ref 11.5–15.5)
WBC: 11.5 10*3/uL — ABNORMAL HIGH (ref 4.0–10.5)
nRBC: 0 % (ref 0.0–0.2)

## 2022-01-04 LAB — COMPREHENSIVE METABOLIC PANEL
ALT: 9 U/L (ref 0–44)
AST: 17 U/L (ref 15–41)
Albumin: 4 g/dL (ref 3.5–5.0)
Alkaline Phosphatase: 74 U/L (ref 38–126)
Anion gap: 9 (ref 5–15)
BUN: 15 mg/dL (ref 8–23)
CO2: 27 mmol/L (ref 22–32)
Calcium: 9.6 mg/dL (ref 8.9–10.3)
Chloride: 99 mmol/L (ref 98–111)
Creatinine, Ser: 1.03 mg/dL (ref 0.61–1.24)
GFR, Estimated: >60 mL/min (ref >60)
Glucose, Bld: 103 mg/dL — ABNORMAL HIGH (ref 70–99)
Potassium: 4 mmol/L (ref 3.5–5.1)
Sodium: 135 mmol/L (ref 135–145)
Total Bilirubin: 0.8 mg/dL (ref 0.3–1.2)
Total Protein: 8 g/dL (ref 6.5–8.1)

## 2022-01-04 LAB — CBG MONITORING, ED: Glucose-Capillary: 98 mg/dL (ref 70–99)

## 2022-01-04 LAB — LIPASE, BLOOD: Lipase: 23 U/L (ref 11–51)

## 2022-01-04 MED ORDER — SODIUM CHLORIDE 0.9 % IV BOLUS
500.0000 mL | Freq: Once | INTRAVENOUS | Status: AC
Start: 2022-01-04 — End: 2022-01-04
  Administered 2022-01-04: 500 mL via INTRAVENOUS

## 2022-01-04 MED ORDER — ONDANSETRON HCL 4 MG/2ML IJ SOLN
4.0000 mg | Freq: Once | INTRAMUSCULAR | Status: AC
  Administered 2022-01-04: 4 mg via INTRAVENOUS

## 2022-01-04 MED ORDER — METOCLOPRAMIDE HCL 10 MG PO TABS: 10.0000 mg | ORAL_TABLET | Freq: Three times a day (TID) | ORAL | 0 refills | Status: DC | PRN

## 2022-01-04 MED ORDER — METHYLPREDNISOLONE 4 MG PO TBPK: ORAL_TABLET | ORAL | 0 refills | Status: DC

## 2022-01-04 MED ORDER — DIPHENHYDRAMINE HCL 50 MG/ML IJ SOLN
12.5000 mg | Freq: Once | INTRAMUSCULAR | Status: AC
  Administered 2022-01-04: 12.5 mg via INTRAVENOUS
  Filled 2022-01-04: qty 1

## 2022-01-04 MED ORDER — DIPHENHYDRAMINE HCL 25 MG PO TABS: 25.0000 mg | ORAL_TABLET | Freq: Three times a day (TID) | ORAL | 0 refills | Status: AC | PRN

## 2022-01-04 MED ORDER — METOCLOPRAMIDE HCL 5 MG/ML IJ SOLN
5.0000 mg | Freq: Once | INTRAMUSCULAR | Status: AC
  Administered 2022-01-04: 5 mg via INTRAVENOUS
  Filled 2022-01-04: qty 2

## 2022-01-04 NOTE — ED Provider Notes (Signed)
[facility] EMERGENCY DEPT Provider Note   CSN: [id number] Arrival date & time: 01/04/22  1204     History  Chief Complaint  Patient presents with   Nausea    Alan Brown is a 71 y.o. male.  HPI  70 year old male with recent diagnosis of diabetes placed on oral medication that he believes is Jardiance for the past week presents to emergency department with concern for epigastric discomfort, nausea and 2 episodes of vomiting.  Patient states he recently finished a course of doxycycline for pneumonia.  He states that his respiratory symptoms have improved but now he is having decreased appetite.  He endorses loose stools.  States that the emesis/stools are nonbloody.  No genitourinary symptoms.  Denies any fever.  States he was not instructed to check his blood sugars at home.  He follows up mainly at the [place], has a follow-up primary appointment in 2 days next week.  Denies any chest pain, shortness of breath.  Also has a side complaint of possible shingles on his scalp for over a month.  Home Medications Prior to Admission medications   Medication Sig Start Date End Date Taking? Authorizing Provider  albuterol (VENTOLIN HFA) 108 (90 Base) MCG/ACT inhaler Inhale 2 puffs into the lungs 4 (four) times daily as needed for wheezing or shortness of breath.     [provider]  aspirin EC 81 MG tablet Take 81 mg by mouth daily.    [provider]  atorvastatin (LIPITOR) 20 MG tablet Take 20 mg by mouth at bedtime.     [provider]  busPIRone (BUSPAR) 15 MG tablet Take 15 mg by mouth 2 (two) times daily.    [provider]  Calcium Carbonate-Vitamin D (CALCIUM 600+D) 600-400 MG-UNIT tablet Take 1 tablet by mouth 2 (two) times daily.    [provider]  carbidopa-levodopa (SINEMET IR) 25-100 MG tablet Take 1 tablet by mouth 3 (three) times daily. Take 1 tablet at 8am/1pm/5pm    [provider]  divalproex (DEPAKOTE  ER) 500 MG 24 hr tablet Take 500 mg by mouth every 12 (twelve) hours.    [provider]  doxycycline (VIBRAMYCIN) 100 MG capsule Take 1 capsule (100 mg total) by mouth 2 (two) times daily. 12/22/21   Al Decant, PA-C  DULoxetine (CYMBALTA) 60 MG capsule Take 60 mg by mouth daily. For mood and pain    [provider]  ferrous sulfate 325 (65 FE) MG tablet Take 325 mg by mouth every Monday, Wednesday, and Friday.    [provider]  memantine (NAMENDA) 10 MG tablet Take 20 mg by mouth at bedtime.    [provider]  methocarbamol (ROBAXIN) 500 MG tablet Take 1,000 mg by mouth 2 (two) times daily.    [provider]  morphine (MS CONTIN) 15 MG 12 hr tablet Take 1 tablet by mouth in the morning and at bedtime. 08/09/20   [provider]  Multiple Vitamin (MULTIVITAMIN WITH MINERALS) TABS tablet Take 1 tablet by mouth daily.    [provider]  omeprazole (PRILOSEC) 40 MG capsule Take 40 mg by mouth 2 (two) times daily before a meal.    [provider]  ondansetron (ZOFRAN) 8 MG tablet Take 1 tablet (8 mg total) by mouth every 8 (eight) hours as needed for nausea or vomiting. 12/22/21   Al Decant, PA-C  Oxycodone HCl 20 MG TABS Take 1 tablet by mouth 5 (five) times daily. 08/09/20  [provider]  pregabalin (LYRICA) 300 MG capsule Take 300 mg by mouth at bedtime.    [provider]  primidone (MYSOLINE) 50 MG tablet Take 50 mg by mouth 3 (three) times daily. For tremors    [provider]  promethazine (PHENERGAN) 25 MG tablet Take 1 tablet (25 mg total) by mouth every 6 (six) hours as needed for nausea. 06/29/19   Tarry Kos, MD  propranolol ER (INDERAL LA) 60 MG 24 hr capsule Take 60 mg by mouth daily.    [provider]  risperiDONE (RISPERDAL) 0.5 MG tablet Take 1 tablet (0.5 mg total) by mouth at bedtime as needed for up to 30 days (for agitation). Patient taking  differently: Take 0.5 mg by mouth daily.  04/27/19 08/15/20  Amin, Loura Halt, MD  tamsulosin (FLOMAX) 0.4 MG CAPS capsule Take 0.4 mg by mouth at bedtime.     [provider]  Tapentadol HCl (NUCYNTA) 100 MG TABS Take 150 mg by mouth 4 (four) times daily as needed (PAIN).     [provider]  Tiotropium Bromide Monohydrate (SPIRIVA RESPIMAT) 2.5 MCG/ACT AERS Inhale 2 puffs into the lungs daily.    [provider]  trifluoperazine (STELAZINE) 1 MG tablet Take 3 mg by mouth at bedtime.    [provider]      Allergies    Patient has no known allergies.    Review of Systems   Review of Systems  Constitutional:  Positive for appetite change. Negative for fever.  Respiratory:  Negative for shortness of breath.   Cardiovascular:  Negative for chest pain.  Gastrointestinal:  Positive for abdominal pain, diarrhea, nausea and vomiting. Negative for abdominal distention, blood in stool, constipation and rectal pain.  Musculoskeletal:  Negative for back pain and neck pain.  Skin:  Positive for rash.  Neurological:  Negative for headaches.   Physical Exam Updated Vital Signs BP 127/76    Pulse 62    Temp (!) 97.4 F (36.3 C)    Resp 18    Ht 5\' 10"  (1.778 m)    Wt 108 kg    SpO2 95%    BMI 34.16 kg/m  Physical Exam Vitals and nursing note reviewed.  Constitutional:      General: He is not in acute distress.    Appearance: Normal appearance. He is not diaphoretic.  HENT:     Head: Normocephalic.     Mouth/Throat:     Mouth: Mucous membranes are moist.  Cardiovascular:     Rate and Rhythm: Normal rate.  Pulmonary:     Effort: Pulmonary effort is normal. No respiratory distress.  Abdominal:     Palpations: Abdomen is soft.     Tenderness: There is abdominal tenderness. There is no guarding or rebound.     Comments: Mild epigastric discomfort to palpation, negative Murphy sign  Skin:    General: Skin is warm.     Comments: Scabbed over lesions  primarily to the left of the midline of the superior scalp.  Do not appear to be any new/vesicular/open lesions  Neurological:     Mental Status: He is alert and oriented to person, place, and time. Mental status is at baseline.  Psychiatric:        Mood and Affect: Mood normal.    ED Results / Procedures / Treatments   Labs (all labs ordered are listed, but only abnormal results are displayed) Labs Reviewed  LIPASE, BLOOD  COMPREHENSIVE METABOLIC PANEL  CBC  URINALYSIS, ROUTINE W REFLEX MICROSCOPIC  CBG MONITORING, ED    EKG None  Radiology No results found.  Procedures Procedures    Medications Ordered in ED Medications  sodium chloride 0.9 % bolus 500 mL (has no administration in time range)  ondansetron (ZOFRAN) injection 4 mg (has no administration in time range)    ED Course/ Medical Decision Making/ A&P                           Medical Decision Making Amount and/or Complexity of Data Reviewed Labs: ordered. Radiology: ordered.  Risk OTC drugs. Prescription drug management.   This patient presents to the ED for concern of nausea/vomiting, this involves an extensive number of treatment options, and is a complaint that carries with it a high risk of complications and morbidity.  The differential diagnosis includes gastritis, viral infection, pancreas/gallbladder pathology, DKA/HHS   Additional history obtained: -External records from outside source obtained and reviewed including: Chart review including previous notes, labs, imaging, consultation notes   Lab Tests: -I ordered, reviewed, and interpreted labs.  The pertinent results include: Normal blood sugar, normal liver function test, bilirubin and pancreatic function.   Imaging Studies ordered: -I ordered imaging studies including ultrasound of the right upper quadrant -I independently visualized and interpreted imaging which showed mild gallbladder thickening but no signs of acute  cholecystitis/infection or obstruction -I agree with the radiologist interpretation   Medicines ordered and prescription drug management: -I ordered medication including Zofran, Reglan/Benadryl, fluids for symptomatic control -Reevaluation of the patient after these medicines showed that the patient resolved -I have reviewed the patients home medicines and have made adjustments as needed   ED Course: 71 year old male presents emergency department ongoing nausea/vomiting.  Patient states he was recently diagnosed with diabetes, placed on oral medication which has been compliant for.  They had also sent him with Zofran for control of nausea which has not been working.  Well-appearing on my exam, benign abdomen.  Blood work is reassuring with no acute abnormalities.  No relief with IV Zofran.  Symptoms resolved with Reglan/Benadryl.  Right upper quadrant ultrasound shows mild gallbladder wall thickening but no other findings of acute cholecystitis/obstruction.  Liver function test, lipase and bilirubin are normal.  Patient has been able to tolerate p.o., offers no new concerns or complaints.  No other concerning findings on work-up, no indication for emergent CT at this time.  Side note it appears the patient has had shingles on his upper scalp, rash appears to not cross midline, is all scabbed over, no new lesions/little blisters/oozing.  Do not see an indication for acyclovir, will treat his pain symptomatically and refer to outpatient.   Cardiac Monitoring: The patient was maintained on a cardiac monitor.  I personally viewed and interpreted the cardiac monitored which showed an underlying rhythm of: Sinus rhythm   Reevaluation: After the interventions noted above, I reevaluated the patient and found that they have :resolved   Dispostion: Patient at this time appears safe and stable for discharge and close outpatient follow up. Discharge plan and strict return to ED precautions discussed,  patient verbalizes understanding and agreement.        Final Clinical Impression(s) / ED Diagnoses Final diagnoses:  Epigastric pain    Rx / DC Orders ED Discharge Orders     None         Rozelle LoganHorton, Sachit Gilman M, DO 01/04/22 2200

## 2022-01-04 NOTE — ED Triage Notes (Signed)
Patient here POV from Home for Nausea/Emesis.  Patient has been having Severe Nausea and Vomiting for approximately a few days. Patient was recently diagnosed with Diabetes and since beginning Jardiance, 10 mg the Patient has been having these symptoms.  Patient finished Doxycycline recently for PNA.  NAD Noted during Triage. A&Ox4. GCS 15. BIB Wheelchair.

## 2022-01-04 NOTE — Discharge Instructions (Addendum)
You have been seen and discharged from the emergency department.  I have discontinued your Zofran and put you on Reglan for nausea control.  Take this as directed.  Take steroid pack for pain relief from scalp rash as directed. Follow-up with your primary provider for further evaluation and further care. Take home medications as prescribed. If you have any worsening symptoms or further concerns for your health please return to an emergency department for further evaluation.

## 2022-01-09 DIAGNOSIS — E1165 Type 2 diabetes mellitus with hyperglycemia: Secondary | ICD-10-CM | POA: Diagnosis not present

## 2022-01-09 DIAGNOSIS — R110 Nausea: Secondary | ICD-10-CM | POA: Diagnosis not present

## 2022-01-09 DIAGNOSIS — R911 Solitary pulmonary nodule: Secondary | ICD-10-CM | POA: Diagnosis not present

## 2022-01-17 ENCOUNTER — Encounter: Payer: Self-pay | Admitting: Physician Assistant

## 2022-01-17 ENCOUNTER — Telehealth: Payer: Self-pay | Admitting: Physician Assistant

## 2022-01-17 ENCOUNTER — Ambulatory Visit: Payer: Medicare PPO | Admitting: Physician Assistant

## 2022-01-17 ENCOUNTER — Other Ambulatory Visit: Payer: Self-pay | Admitting: Physician Assistant

## 2022-01-17 VITALS — BP 114/68 | HR 84 | Ht 70.0 in | Wt 223.0 lb

## 2022-01-17 DIAGNOSIS — R1013 Epigastric pain: Secondary | ICD-10-CM | POA: Diagnosis not present

## 2022-01-17 DIAGNOSIS — Z86010 Personal history of colonic polyps: Secondary | ICD-10-CM | POA: Diagnosis not present

## 2022-01-17 DIAGNOSIS — R112 Nausea with vomiting, unspecified: Secondary | ICD-10-CM

## 2022-01-17 DIAGNOSIS — K219 Gastro-esophageal reflux disease without esophagitis: Secondary | ICD-10-CM

## 2022-01-17 DIAGNOSIS — G20 Parkinson's disease: Secondary | ICD-10-CM | POA: Diagnosis not present

## 2022-01-17 MED ORDER — METOCLOPRAMIDE HCL 10 MG PO TABS: 10.0000 mg | ORAL_TABLET | Freq: Three times a day (TID) | ORAL | 0 refills | Status: DC | PRN

## 2022-01-17 MED ORDER — METOCLOPRAMIDE HCL 10 MG PO TABS: 10.0000 mg | ORAL_TABLET | Freq: Two times a day (BID) | ORAL | 3 refills | Status: DC

## 2022-01-17 MED ORDER — NA SULFATE-K SULFATE-MG SULF 17.5-3.13-1.6 GM/177ML PO SOLN: 1.0000 | Freq: Once | ORAL | 0 refills | Status: DC

## 2022-01-17 NOTE — Telephone Encounter (Signed)
Patient's wife, [name], called about scheduling patient's endo/colon.  I offered to help her, but she insisted on speaking with you as she said you had all the information she needed.  Please call [name] back as soon as you can to get this scheduled.  Thank you.

## 2022-01-17 NOTE — Telephone Encounter (Signed)
[name] called back and stated they would like to schedule the endo/colon for March 14.  Please call her to confirm.  Thank you.

## 2022-01-17 NOTE — Telephone Encounter (Signed)
Patient scheduled for March 17. Patient's wife has been advised that instructions will be mailed out

## 2022-01-17 NOTE — Patient Instructions (Signed)
If you are age 72 or older, your body mass index should be between 23-30. Your Body mass index is 32 kg/m. If this is out of the aforementioned range listed, please consider follow up with your Primary Care Provider. ________________________________________________________  The [facility] providers would like to encourage you to use [facility] to communicate with providers for non-urgent requests or questions.  Due to long hold times on the telephone, sending your provider a message by [facility] may be a faster and more efficient way to get a response.  Please allow 48 business hours for a response.  Please remember that this is for non-urgent requests.  _______________________________________________________  It has been recommended to you by your physician that you have a(n) Colonoscopy/Endoscopy completed. Per your request, we did not schedule the procedure(s) today. Please contact our office at [phone] should you decide to have the procedure completed. You will be scheduled for a pre-visit and procedure at that time.   You have been scheduled for a CT scan of the abdomen and pelvis at [facility] ([address]---this is in the same building as [employer]).   You are scheduled on 01/22/2022 at 10:15 am. You should arrive 15 minutes prior to your appointment time for registration. Please follow the written instructions below on the day of your exam:  WARNING: IF YOU ARE ALLERGIC TO IODINE/X-RAY DYE, PLEASE NOTIFY RADIOLOGY IMMEDIATELY AT [phone]! YOU WILL BE GIVEN A 13 HOUR PREMEDICATION PREP.  1) Do not eat anything after 6:15 am (4 hours prior to your test) 2) You have been given 2 bottles of oral contrast to drink. The solution may taste better if refrigerated, but do NOT add ice or any other liquid to this solution. Shake well before drinking.    Drink 1 bottle of contrast @ 8:15 am (2 hours prior to your exam)  Drink 1 bottle of contrast @ 9:15 am (1 hour  prior to your exam)  You may take any medications as prescribed with a small amount of water, if necessary. If you take any of the following medications: METFORMIN, GLUCOPHAGE, GLUCOVANCE, AVANDAMET, RIOMET, FORTAMET, [place] MET, JANUMET, GLUMETZA or METAGLIP, you MAY be asked to HOLD this medication 48 hours AFTER the exam.  The purpose of you drinking the oral contrast is to aid in the visualization of your intestinal tract. The contrast solution may cause some diarrhea. Depending on your individual set of symptoms, you may also receive an intravenous injection of x-ray contrast/dye. Plan on being at [facility] for 30 minutes or longer, depending on the type of exam you are having performed.  This test typically takes 30-45 minutes to complete.  If you have any questions regarding your exam or if you need to reschedule, you may call the CT department at [phone] between the hours of 8:00 am and 5:00 pm, Monday-Friday.  ____________________________________________________________  Continue Omeprazole 40 mg 1 capsule every morning  We have refilled Reglan 10 mg 1 tablet twice daily.  Follow a Gastroparesis diet.  Thank you for entrusting me with your care and choosing [facility].  Amy Esterwood, PA-C

## 2022-01-17 NOTE — Progress Notes (Signed)
Subjective:    Patient ID: Alan Brown, male    DOB: Jun 17, 1951, 71 y.o.   MRN: [id number]  HPI Alan Brown is a pleasant 71 year old white male, known remotely to Dr. Ardis Hughs from colonoscopy in 2013, who is referred today by Dr. Donnie Coffin for evaluation of recent acute GI symptoms with nausea vomiting and epigastric pain. Patient had a pneumonia in early January 2023, then had a second ER visit on 01/04/2022 after onset of upper abdominal discomfort and 4 days of nausea and vomiting.  He had recently been diagnosed with diabetes and had started on Jardiance.  Work-up in the ER with CBC and c-Met both unremarkable. He had upper abdominal ultrasound that showed a trace amount of gallbladder sludge with mild wall thickening to 4 mm but no other features to suggest acute gallbladder disease Patient is on chronic PPI therapy/omeprazole for GERD, he was continued on this, given antiemetics and was also given a prescription for metoclopramide and Benadryl.  He has been taking the metoclopramide 10 mg twice daily and says that that is definitely helping to control his symptoms.  He was miserable prior to starting the metoclopramide.  He says symptoms initially started about 5 weeks ago, he had started on Nutrisystem to lose weight and symptoms a started about a week later though he thinks unrelated to Nutrisystem.  He says he never had any intense or significant epigastric discomfort it was more a sense of fullness discomfort and then persistent nausea with vomiting.  He has no current complaints of heartburn or indigestion, no dysphagia or odynophagia.  No current abdominal discomfort and is able to eat a regular meal at this point without difficulty.  He is not having postprandial abdominal discomfort.  He does have chronic constipation and is having a bowel movement every 2 to 3 days. Patient has multiple comorbidities and is on multiple medications including chronic narcotics including MS Contin  and oxycodone for chronic back pain.  He has diagnosis of bipolar disorder, Parkinson's disease, BPH. He is also followed at the [place] by a Dr. Ronnald Ramp in [place] and says he has full [facility] benefits.  No prior EGD Last colonoscopy here in 2013 with finding of a serrated adenoma and moderate sigmoid diverticulosis.  He was indicated for 5-year interval follow-up and has not had repeat colonoscopy. Review of Systems Pertinent positive and negative review of systems were noted in the above HPI section.  All other review of systems was otherwise negative.   Outpatient Encounter Medications as of 01/17/2022  Medication Sig   albuterol (VENTOLIN HFA) 108 (90 Base) MCG/ACT inhaler Inhale 2 puffs into the lungs 4 (four) times daily as needed for wheezing or shortness of breath.    aspirin EC 81 MG tablet Take 81 mg by mouth daily.   atorvastatin (LIPITOR) 20 MG tablet Take 20 mg by mouth at bedtime.    busPIRone (BUSPAR) 15 MG tablet Take 15 mg by mouth 2 (two) times daily.   Calcium Carbonate-Vitamin D 600-400 MG-UNIT tablet Take 1 tablet by mouth 2 (two) times daily.   carbidopa-levodopa (SINEMET IR) 25-100 MG tablet Take 1 tablet by mouth 3 (three) times daily. Take 1 tablet at 8am/1pm/5pm   diphenhydrAMINE (BENADRYL) 25 MG tablet Take 1 tablet (25 mg total) by mouth every 8 (eight) hours as needed.   divalproex (DEPAKOTE ER) 500 MG 24 hr tablet Take 500 mg by mouth every 12 (twelve) hours.   DULoxetine (CYMBALTA) 60 MG capsule Take 60 mg by mouth  daily. For mood and pain   JARDIANCE 10 MG TABS tablet Take 10 mg by mouth daily.   memantine (NAMENDA) 10 MG tablet Take 20 mg by mouth at bedtime.   methocarbamol (ROBAXIN) 500 MG tablet Take 1,000 mg by mouth 2 (two) times daily.   morphine (MS CONTIN) 15 MG 12 hr tablet Take 1 tablet by mouth in the morning and at bedtime.   Multiple Vitamin (MULTIVITAMIN WITH MINERALS) TABS tablet Take 1 tablet by mouth daily.   Na Sulfate-K Sulfate-Mg Sulf  17.5-3.13-1.6 GM/177ML SOLN Take 1 kit by mouth once for 1 dose.   omeprazole (PRILOSEC) 40 MG capsule Take 40 mg by mouth 2 (two) times daily before a meal.   Oxycodone HCl 20 MG TABS Take 1 tablet by mouth 5 (five) times daily.   pregabalin (LYRICA) 300 MG capsule Take 300 mg by mouth at bedtime.   primidone (MYSOLINE) 50 MG tablet Take 50 mg by mouth 3 (three) times daily. For tremors   propranolol ER (INDERAL LA) 60 MG 24 hr capsule Take 60 mg by mouth daily.   risperiDONE (RISPERDAL) 0.5 MG tablet Take 1 tablet (0.5 mg total) by mouth at bedtime as needed for up to 30 days (for agitation).   tamsulosin (FLOMAX) 0.4 MG CAPS capsule Take 0.4 mg by mouth at bedtime.    Tapentadol HCl 100 MG TABS Take 150 mg by mouth 4 (four) times daily as needed (PAIN).    Tiotropium Bromide Monohydrate 2.5 MCG/ACT AERS Inhale 2 puffs into the lungs daily.   trifluoperazine (STELAZINE) 1 MG tablet Take 3 mg by mouth at bedtime.   [DISCONTINUED] metoCLOPramide (REGLAN) 10 MG tablet Take 1 tablet (10 mg total) by mouth every 8 (eight) hours as needed for nausea.   metoCLOPramide (REGLAN) 10 MG tablet Take 1 tablet (10 mg total) by mouth in the morning and at bedtime.   [DISCONTINUED] doxycycline (VIBRAMYCIN) 100 MG capsule Take 1 capsule (100 mg total) by mouth 2 (two) times daily. (Patient not taking: Reported on 01/17/2022)   [DISCONTINUED] ferrous sulfate 325 (65 FE) MG tablet Take 325 mg by mouth every Monday, Wednesday, and Friday. (Patient not taking: Reported on 01/17/2022)   [DISCONTINUED] methylPREDNISolone (MEDROL DOSEPAK) 4 MG TBPK tablet Take as directed (Patient not taking: Reported on 01/17/2022)   [DISCONTINUED] metoCLOPramide (REGLAN) 10 MG tablet Take 1 tablet (10 mg total) by mouth every 8 (eight) hours as needed for nausea.   [DISCONTINUED] promethazine (PHENERGAN) 25 MG tablet Take 1 tablet (25 mg total) by mouth every 6 (six) hours as needed for nausea. (Patient not taking: Reported on 01/17/2022)    No facility-administered encounter medications on file as of 01/17/2022.   No Known Allergies Patient Active Problem List   Diagnosis Date Noted   Parkinson's disease ([place]) 01/17/2022   S/P right rotator cuff repair 08/10/2019   Impingement syndrome of right shoulder    Acute respiratory failure with hypoxia ([place]) 04/23/2019   Tear of right supraspinatus tendon 03/23/2019   Arthrosis of right acromioclavicular joint 03/23/2019   Depression 01/24/2014   Polysubstance (including opioids) dependence, daily use ([place]) 01/24/2014   Chronic pain syndrome 01/24/2014   Sepsis ([place]) 01/23/2014   Altered mental status 01/23/2014   Acute encephalopathy 11/19/2013   SIRS (systemic inflammatory response syndrome) ([place]) 11/19/2013   Community acquired pneumonia 11/19/2013   GERD (gastroesophageal reflux disease) 11/19/2013   Bipolar 1 disorder ([place]) 11/19/2013   Lactic acidosis 11/19/2013   Pneumonia 11/19/2013   Social History  Socioeconomic History   Marital status: Married    Spouse name: Not on file   Number of children: Not on file   Years of education: Not on file   Highest education level: Not on file  Occupational History   Occupation: disabled veteran  Tobacco Use   Smoking status: Former    Packs/day: 1.00    Years: 25.00    Pack years: 25.00    Types: Cigarettes   Smokeless tobacco: Never   Tobacco comments:    01/26/2014 "quit smoking 15-16 yr ago"  Vaping Use   Vaping Use: Never used  Substance and Sexual Activity   Alcohol use: No    Comment:  "quit drinking 25 yr ago; I'm a recovering alcoholic"   Drug use: No   Sexual activity: Not Currently  Other Topics Concern   Not on file  Social History Narrative   Not on file   Social Determinants of Health   Financial Resource Strain: Not on file  Food Insecurity: Not on file  Transportation Needs: Not on file  Physical Activity: Not on file  Stress: Not on file  Social Connections: Not on file  Intimate  Partner Violence: Not on file    Mr. Salaam's family history includes Aneurysm in his father; Diabetes in his sister; Heart disease in his sister; Hypertension in his father and mother; Other in his sister.      Objective:    Vitals:   01/17/22 0922  BP: 114/68  Pulse: 84    Physical Exam Well-developed well-nourished chronically ill-appearing older white male in no acute distress.  Accompanied by his wife Weight 223, BMI 32.0  HEENT; nontraumatic normocephalic, EOMI, PE R LA, sclera anicteric. Oropharynx; not examined today Neck; supple, no JVD Cardiovascular; regular rate and rhythm with S1-S2, no murmur rub or gallop Pulmonary; Clear bilaterally Abdomen; soft, nontender, obese nondistended, no palpable mass or hepatosplenomegaly, bowel sounds are active no succussion splash, Rectal; not done today Skin; benign exam, no jaundice rash or appreciable lesions Extremities; no clubbing cyanosis or edema skin warm and dry Neuro/Psych; alert and oriented x4, grossly nonfocal mood and affect appropriate        Assessment & Plan:   #60  71 year old white male with 5-week history of nausea, initially with upper abdominal discomfort which has resolved, vomiting now resolved and fullness Significant improvement in symptoms on metoclopramide 10 mg twice daily suggesting component of gastroparesis. Patient has also had new diagnosis of diabetes December 2022 Chronic narcotic use  Etiology of his symptoms is not entirely certain, surely he has decreased gastric emptying with chronic narcotics and numerous medications however he had not had any recent changes in those medications or dosages.  Will need to rule out peptic ulcer disease, occult neoplasm, possible medication effect secondary to Jardiance  #2 chronic GERD-on chronic omeprazole #3 Parkinson's disease #4 chronic pain syndrome, chronic back pain on chronic narcotics, muscle relaxers and Neurontin #5 bipolar disorder 6.   Recent pneumonia January 2023 #7 gallbladder sludge #8 remote history of EtOH abuse/sober x28 years #9 history of sessile serrated adenoma/last colonoscopy 2013 overdue for follow-up #10 diverticulosis  Plan; patient will be scheduled for CT of the abdomen and pelvis with contrast. Continue omeprazole 40 mg p.o. every morning I have refilled metoclopramide 10 mg p.o. for twice daily dosing #60 and 3 refills.  We discussed potential neurologic side effects with metoclopramide and he will notify us for any symptoms. Gastroparesis diet We also discussed indication for EGD and  colonoscopy.  Patient will be scheduled for EGD and colonoscopy with Dr. Ardis Hughs.  Both procedures were discussed in detail including indications risks and benefits and he is agreeable to proceed. Patient is appropriate for endoscopic evaluation in the ambulatory care setting as of today's visit.   Edouard Gikas S Stefana Lodico PA-C 01/17/2022   Cc: Alroy Dust, L.Marlou Sa, MD

## 2022-01-19 NOTE — Progress Notes (Signed)
I agree with the above note, plan 

## 2022-01-22 ENCOUNTER — Ambulatory Visit (INDEPENDENT_AMBULATORY_CARE_PROVIDER_SITE_OTHER)
Admission: RE | Admit: 2022-01-22 | Discharge: 2022-01-22 | Disposition: A | Discharge status: Home / Self Care | Payer: Medicare PPO | Source: Ambulatory Visit | Attending: Physician Assistant | Admitting: Physician Assistant

## 2022-01-22 ENCOUNTER — Other Ambulatory Visit: Payer: Self-pay

## 2022-01-22 DIAGNOSIS — I700 Atherosclerosis of aorta: Secondary | ICD-10-CM | POA: Diagnosis not present

## 2022-01-22 DIAGNOSIS — R1110 Vomiting, unspecified: Secondary | ICD-10-CM | POA: Diagnosis not present

## 2022-01-22 DIAGNOSIS — Z86010 Personal history of colonic polyps: Secondary | ICD-10-CM

## 2022-01-22 DIAGNOSIS — R112 Nausea with vomiting, unspecified: Secondary | ICD-10-CM

## 2022-01-22 DIAGNOSIS — R109 Unspecified abdominal pain: Secondary | ICD-10-CM | POA: Diagnosis not present

## 2022-01-22 DIAGNOSIS — Z860101 Personal history of adenomatous and serrated colon polyps: Secondary | ICD-10-CM

## 2022-01-22 DIAGNOSIS — R1013 Epigastric pain: Secondary | ICD-10-CM

## 2022-01-22 MED ORDER — IOHEXOL 300 MG/ML  SOLN
100.0000 mL | Freq: Once | INTRAMUSCULAR | Status: AC | PRN
  Administered 2022-01-22: 100 mL via INTRAVENOUS

## 2022-01-23 ENCOUNTER — Telehealth: Payer: Self-pay

## 2022-01-23 NOTE — Telephone Encounter (Signed)
Left message to please call back. °

## 2022-01-23 NOTE — Telephone Encounter (Signed)
-----   Message from Sammuel Cooper, PA-C sent at 01/23/2022 12:32 PM EST ----- Please call patient and let him know the CT scan does not show any abnormality in the upper abdomen, which is good news, he does have diverticulosis but no signs of diverticulitis it also shows that his right lower lobe pneumonia is almost completely resolved.

## 2022-01-24 ENCOUNTER — Ambulatory Visit (INDEPENDENT_AMBULATORY_CARE_PROVIDER_SITE_OTHER): Payer: No Typology Code available for payment source | Admitting: Pulmonary Disease

## 2022-01-24 ENCOUNTER — Other Ambulatory Visit: Payer: Self-pay

## 2022-01-24 ENCOUNTER — Encounter: Payer: Self-pay | Admitting: Pulmonary Disease

## 2022-01-24 VITALS — BP 116/82 | HR 88 | Temp 98.1°F | Ht 70.0 in | Wt 224.6 lb

## 2022-01-24 DIAGNOSIS — R918 Other nonspecific abnormal finding of lung field: Secondary | ICD-10-CM | POA: Diagnosis not present

## 2022-01-24 DIAGNOSIS — R911 Solitary pulmonary nodule: Secondary | ICD-10-CM

## 2022-01-24 DIAGNOSIS — Z87891 Personal history of nicotine dependence: Secondary | ICD-10-CM

## 2022-01-24 NOTE — Patient Instructions (Signed)
Thank you for visiting Dr. Valeta Harms at [facility]. Today we recommend the following:  Orders Placed This Encounter  Procedures   CT CHEST WO CONTRAST   See [place] after your CT chest   Return in about 3 weeks (around 02/14/2022) for with Eric Form, NP.    Please do your part to reduce the spread of COVID-19.

## 2022-01-24 NOTE — Progress Notes (Signed)
Synopsis: Referred in February 2023 for abnormal CT chest by Landry Mellow, MD  Subjective:   PATIENT ID: Alan Brown: male DOB: 01-29-1951, MRN: [id number]  Chief Complaint  Patient presents with   Consult    consult    This is a 71 year old gentleman, past medical history of depression, GERD, TIA.  Patient was referred for evaluation of abnormal CT imaging.  From what I can tell he had a full CT chest in April 2022 completed at the [facility] system which showed a right lower lobe groundglass opacity.  He is a former smoker quit 23 years ago.  He served in the [place] [employer] during [place].  He also had pneumonia this past year had a CT in in January with a follow-up image of the lower lobe abdominal CT which showed partial resolution of the inflammatory infiltrates in the right lower lobe.  I have not had a full CT chest in our system to review since 2020.   Past Medical History:  Diagnosis Date   Anxiety    Arthritis    "all through my body" (01/26/2014)   Bipolar 1 disorder (HCC)    Chronic lower back pain    Depression    Depression    GERD (gastroesophageal reflux disease)    Parkinson's disease ([place])    Pneumonia 1952; 11/2013   Prolapse, disk    lower back   Stroke [facility])    pt reported TIA     Family History  Problem Relation Age of Onset   Hypertension Mother    Hypertension Father    Aneurysm Father    Diabetes Sister    Other Sister        killed   Heart disease Sister    Colon cancer Neg Hx    Esophageal cancer Neg Hx    Pancreatic cancer Neg Hx    Stomach cancer Neg Hx      Past Surgical History:  Procedure Laterality Date   APPENDECTOMY  12/16/1959   COLONOSCOPY     INGUINAL HERNIA REPAIR Left 12/15/1996   SHOULDER ARTHROSCOPY WITH ROTATOR CUFF REPAIR AND SUBACROMIAL DECOMPRESSION Right 06/29/2019   Procedure: RIGHT SHOULDER ARTHROSCOPY WITH ROTATOR CUFF REPAIR, BICEPS TENO SUBACROMIAL DECOMPRESSION, DISTAL CLAVICLE EXCISION, EXTENSIVE  DEBRIDEMENT;  Surgeon: Leandrew Koyanagi, MD;  Location: [place];  Service: Orthopedics;  Laterality: Right;   TONSILLECTOMY  12/15/1974    Social History   Socioeconomic History   Marital status: Married    Spouse name: Not on file   Number of children: Not on file   Years of education: Not on file   Highest education level: Not on file  Occupational History   Occupation: disabled veteran  Tobacco Use   Smoking status: Former    Packs/day: 1.00    Years: 25.00    Pack years: 25.00    Types: Cigarettes   Smokeless tobacco: Never   Tobacco comments:    01/26/2014 "quit smoking 15-16 yr ago"  Vaping Use   Vaping Use: Never used  Substance and Sexual Activity   Alcohol use: No    Comment:  "quit drinking 25 yr ago; I'm a recovering alcoholic"   Drug use: No   Sexual activity: Not Currently  Other Topics Concern   Not on file  Social History Narrative   Not on file   Social Determinants of Health   Financial Resource Strain: Not on file  Food Insecurity: Not on file  Transportation Needs: Not on  file  Physical Activity: Not on file  Stress: Not on file  Social Connections: Not on file  Intimate Partner Violence: Not on file     No Known Allergies   Outpatient Medications Prior to Visit  Medication Sig Dispense Refill   albuterol (VENTOLIN HFA) 108 (90 Base) MCG/ACT inhaler Inhale 2 puffs into the lungs 4 (four) times daily as needed for wheezing or shortness of breath.      aspirin EC 81 MG tablet Take 81 mg by mouth daily.     atorvastatin (LIPITOR) 20 MG tablet Take 20 mg by mouth at bedtime.      busPIRone (BUSPAR) 15 MG tablet Take 15 mg by mouth 2 (two) times daily.     Calcium Carbonate-Vitamin D 600-400 MG-UNIT tablet Take 1 tablet by mouth 2 (two) times daily.     carbidopa-levodopa (SINEMET IR) 25-100 MG tablet Take 1 tablet by mouth 3 (three) times daily. Take 1 tablet at 8am/1pm/5pm     diphenhydrAMINE (BENADRYL) 25 MG tablet Take 1 tablet  (25 mg total) by mouth every 8 (eight) hours as needed. 30 tablet 0   divalproex (DEPAKOTE ER) 500 MG 24 hr tablet Take 500 mg by mouth every 12 (twelve) hours.     DULoxetine (CYMBALTA) 60 MG capsule Take 60 mg by mouth daily. For mood and pain     JARDIANCE 10 MG TABS tablet Take 10 mg by mouth daily.     memantine (NAMENDA) 10 MG tablet Take 20 mg by mouth at bedtime.     methocarbamol (ROBAXIN) 500 MG tablet Take 1,000 mg by mouth 2 (two) times daily.     metoCLOPramide (REGLAN) 10 MG tablet Take 1 tablet (10 mg total) by mouth in the morning and at bedtime. 60 tablet 3   morphine (MS CONTIN) 15 MG 12 hr tablet Take 1 tablet by mouth in the morning and at bedtime.     Multiple Vitamin (MULTIVITAMIN WITH MINERALS) TABS tablet Take 1 tablet by mouth daily.     omeprazole (PRILOSEC) 40 MG capsule Take 40 mg by mouth 2 (two) times daily before a meal.     Oxycodone HCl 20 MG TABS Take 1 tablet by mouth 5 (five) times daily.     pregabalin (LYRICA) 300 MG capsule Take 300 mg by mouth at bedtime.     primidone (MYSOLINE) 50 MG tablet Take 50 mg by mouth 3 (three) times daily. For tremors     propranolol ER (INDERAL LA) 60 MG 24 hr capsule Take 60 mg by mouth daily.     Sod Picosulfate-Mag Ox-Cit Acd (CLENPIQ) 10-3.5-12 MG-GM -GM/160ML SOLN Take 1 kit by mouth as directed. 320 mL 0   tamsulosin (FLOMAX) 0.4 MG CAPS capsule Take 0.4 mg by mouth at bedtime.      Tapentadol HCl 100 MG TABS Take 150 mg by mouth 4 (four) times daily as needed (PAIN).      Tiotropium Bromide Monohydrate 2.5 MCG/ACT AERS Inhale 2 puffs into the lungs daily.     trifluoperazine (STELAZINE) 1 MG tablet Take 3 mg by mouth at bedtime.     risperiDONE (RISPERDAL) 0.5 MG tablet Take 1 tablet (0.5 mg total) by mouth at bedtime as needed for up to 30 days (for agitation). 30 tablet 0   No facility-administered medications prior to visit.    ROS   Objective:  Physical Exam   Vitals:   01/24/22 1352  BP: 116/82  Pulse:  88  Temp: 98.1 F (36.7  C)  TempSrc: Oral  SpO2: 93%  Weight: 224 lb 9.6 oz (101.9 kg)  Height: _0  (1.778 m)   93% on RA BMI Readings from Last 3 Encounters:  01/24/22 32.23 kg/m  01/17/22 32.00 kg/m  01/04/22 34.16 kg/m   Wt Readings from Last 3 Encounters:  01/24/22 224 lb 9.6 oz (101.9 kg)  01/17/22 223 lb (101.2 kg)  01/04/22 238 lb 1.6 oz (108 kg)     CBC    Component Value Date/Time   WBC 11.5 (H) 01/04/2022 1344   RBC 4.81 01/04/2022 1344   HGB 13.7 01/04/2022 1344   HCT 43.4 01/04/2022 1344   PLT 365 01/04/2022 1344   MCV 90.2 01/04/2022 1344   MCH 28.5 01/04/2022 1344   MCHC 31.6 01/04/2022 1344   RDW 16.5 (H) 01/04/2022 1344   LYMPHSABS 2.2 11/04/2021 2328   MONOABS 0.8 11/04/2021 2328   EOSABS 0.3 11/04/2021 2328   BASOSABS 0.1 11/04/2021 2328    Chest Imaging:  03/25/2021: Impression: 1. Persistent groundglass opacification in the basilar RML is  nonspecific. New small patch of infectious or inflammatory  bronchiolitis in the basilar LLL. In addition to typical  infectious agents also consider atypical infectious agents such  as MAC/nontuberculous mycobacterium.  2. Mild pulmonary emphysema.   Pulmonary Functions Testing Results: No flowsheet data found.  FeNO:   Pathology:   Echocardiogram:   Heart Catheterization:     Assessment & Plan:     ICD-10-CM   1. Lung nodule  R91.1 CT CHEST WO CONTRAST    2. Ground glass opacity present on imaging of lung  R91.8 CT CHEST WO CONTRAST    3. Former smoker  Z87.891       Discussion:  This is a 71 year old gentleman, former smoker, [place] [employer] in [place].  Found to have a right lower lobe groundglass nodule.  He had CT imaging of the chest in April 2022 completed at the [facility] system.  I can review the report from this.  He also had abdominal CT imaging which also caught a portion of the lung on his CT then.  Plan: I think the first thing he needs is a repeat CT chest. We will have  this completed and once he has a repeat CT chest we will review the results and come up with the next best steps. From the imaging that I can see so far I am not seeing anything that is concerning for malignancy. We will have him follow-up in our office in a few weeks after the CT is complete.    Current Outpatient Medications:    albuterol (VENTOLIN HFA) 108 (90 Base) MCG/ACT inhaler, Inhale 2 puffs into the lungs 4 (four) times daily as needed for wheezing or shortness of breath. , Disp: , Rfl:    aspirin EC 81 MG tablet, Take 81 mg by mouth daily., Disp: , Rfl:    atorvastatin (LIPITOR) 20 MG tablet, Take 20 mg by mouth at bedtime. , Disp: , Rfl:    busPIRone (BUSPAR) 15 MG tablet, Take 15 mg by mouth 2 (two) times daily., Disp: , Rfl:    Calcium Carbonate-Vitamin D 600-400 MG-UNIT tablet, Take 1 tablet by mouth 2 (two) times daily., Disp: , Rfl:    carbidopa-levodopa (SINEMET IR) 25-100 MG tablet, Take 1 tablet by mouth 3 (three) times daily. Take 1 tablet at 8am/1pm/5pm, Disp: , Rfl:    diphenhydrAMINE (BENADRYL) 25 MG tablet, Take 1 tablet (25 mg total) by mouth every 8 (eight)  hours as needed., Disp: 30 tablet, Rfl: 0   divalproex (DEPAKOTE ER) 500 MG 24 hr tablet, Take 500 mg by mouth every 12 (twelve) hours., Disp: , Rfl:    DULoxetine (CYMBALTA) 60 MG capsule, Take 60 mg by mouth daily. For mood and pain, Disp: , Rfl:    JARDIANCE 10 MG TABS tablet, Take 10 mg by mouth daily., Disp: , Rfl:    memantine (NAMENDA) 10 MG tablet, Take 20 mg by mouth at bedtime., Disp: , Rfl:    methocarbamol (ROBAXIN) 500 MG tablet, Take 1,000 mg by mouth 2 (two) times daily., Disp: , Rfl:    metoCLOPramide (REGLAN) 10 MG tablet, Take 1 tablet (10 mg total) by mouth in the morning and at bedtime., Disp: 60 tablet, Rfl: 3   morphine (MS CONTIN) 15 MG 12 hr tablet, Take 1 tablet by mouth in the morning and at bedtime., Disp: , Rfl:    Multiple Vitamin (MULTIVITAMIN WITH MINERALS) TABS tablet, Take 1 tablet  by mouth daily., Disp: , Rfl:    omeprazole (PRILOSEC) 40 MG capsule, Take 40 mg by mouth 2 (two) times daily before a meal., Disp: , Rfl:    Oxycodone HCl 20 MG TABS, Take 1 tablet by mouth 5 (five) times daily., Disp: , Rfl:    pregabalin (LYRICA) 300 MG capsule, Take 300 mg by mouth at bedtime., Disp: , Rfl:    primidone (MYSOLINE) 50 MG tablet, Take 50 mg by mouth 3 (three) times daily. For tremors, Disp: , Rfl:    propranolol ER (INDERAL LA) 60 MG 24 hr capsule, Take 60 mg by mouth daily., Disp: , Rfl:    Sod Picosulfate-Mag Ox-Cit Acd (CLENPIQ) 10-3.5-12 MG-GM -GM/160ML SOLN, Take 1 kit by mouth as directed., Disp: 320 mL, Rfl: 0   tamsulosin (FLOMAX) 0.4 MG CAPS capsule, Take 0.4 mg by mouth at bedtime. , Disp: , Rfl:    Tapentadol HCl 100 MG TABS, Take 150 mg by mouth 4 (four) times daily as needed (PAIN). , Disp: , Rfl:    Tiotropium Bromide Monohydrate 2.5 MCG/ACT AERS, Inhale 2 puffs into the lungs daily., Disp: , Rfl:    trifluoperazine (STELAZINE) 1 MG tablet, Take 3 mg by mouth at bedtime., Disp: , Rfl:    risperiDONE (RISPERDAL) 0.5 MG tablet, Take 1 tablet (0.5 mg total) by mouth at bedtime as needed for up to 30 days (for agitation)., Disp: 30 tablet, Rfl: 0    Garner Nash, DO [facility] 01/24/2022 2:40 PM

## 2022-01-27 ENCOUNTER — Encounter: Payer: Medicare PPO | Admitting: Gastroenterology

## 2022-01-27 DIAGNOSIS — E1165 Type 2 diabetes mellitus with hyperglycemia: Secondary | ICD-10-CM | POA: Diagnosis not present

## 2022-01-27 DIAGNOSIS — G20 Parkinson's disease: Secondary | ICD-10-CM | POA: Diagnosis not present

## 2022-01-27 DIAGNOSIS — F329 Major depressive disorder, single episode, unspecified: Secondary | ICD-10-CM | POA: Diagnosis not present

## 2022-01-27 DIAGNOSIS — R110 Nausea: Secondary | ICD-10-CM | POA: Diagnosis not present

## 2022-01-27 DIAGNOSIS — R911 Solitary pulmonary nodule: Secondary | ICD-10-CM | POA: Diagnosis not present

## 2022-01-31 ENCOUNTER — Other Ambulatory Visit: Payer: Self-pay

## 2022-01-31 ENCOUNTER — Encounter: Payer: Self-pay | Admitting: Gastroenterology

## 2022-01-31 ENCOUNTER — Ambulatory Visit (AMBULATORY_SURGERY_CENTER): Payer: No Typology Code available for payment source | Admitting: Gastroenterology

## 2022-01-31 VITALS — BP 129/72 | HR 59 | Temp 96.8°F | Resp 13 | Ht 70.0 in | Wt 223.0 lb

## 2022-01-31 DIAGNOSIS — K317 Polyp of stomach and duodenum: Secondary | ICD-10-CM

## 2022-01-31 DIAGNOSIS — R112 Nausea with vomiting, unspecified: Secondary | ICD-10-CM

## 2022-01-31 DIAGNOSIS — K319 Disease of stomach and duodenum, unspecified: Secondary | ICD-10-CM | POA: Diagnosis not present

## 2022-01-31 DIAGNOSIS — Z86010 Personal history of colonic polyps: Secondary | ICD-10-CM

## 2022-01-31 DIAGNOSIS — K2970 Gastritis, unspecified, without bleeding: Secondary | ICD-10-CM | POA: Diagnosis not present

## 2022-01-31 MED ORDER — SODIUM CHLORIDE 0.9 % IV SOLN: 500.0000 mL | Freq: Once | INTRAVENOUS | Status: DC

## 2022-01-31 NOTE — Op Note (Addendum)
[facility] Patient Name: Alan Brown Procedure Date: 01/31/2022 2:33 PM MRN: [id number] Endoscopist: Lynann Bologna , MD Age: 71 Referring MD:  Date of Birth: 1951-01-07 Gender: Male Account #: [id number] Procedure:                Colonoscopy Indications:              High risk colon cancer surveillance: Personal                            history of colonic polyps. H/O constipation. Neg CT                            Abdo/pelvis except for diverticulosis. Medicines:                Monitored Anesthesia Care Procedure:                Pre-Anesthesia Assessment:                           - Prior to the procedure, a History and Physical                            was performed, and patient medications and                            allergies were reviewed. The patient's tolerance of                            previous anesthesia was also reviewed. The risks                            and benefits of the procedure and the sedation                            options and risks were discussed with the patient.                            All questions were answered, and informed consent                            was obtained. Prior Anticoagulants: The patient has                            taken no previous anticoagulant or antiplatelet                            agents. ASA Grade Assessment: III - A patient with                            severe systemic disease. After reviewing the risks                            and benefits, the patient was deemed in  satisfactory condition to undergo the procedure.                           After obtaining informed consent, the colonoscope                            was passed under direct vision. Throughout the                            procedure, the patient's blood pressure, pulse, and                            oxygen saturations were monitored continuously. The                            CF [id number] #[id number] was  introduced through the anus                            and advanced to the the cecum, identified by                            appendiceal orifice and ileocecal valve. The                            colonoscopy was somewhat difficult due to                            inadequate bowel prep (R colon and some areas of                            the sigmoid colon) and a redundant colon.                            Successful completion of the procedure was aided by                            applying abdominal pressure and extensive lavage.                            Retained solid stool especially in the right colon                            would clog the suction channel of the scope. Of                            note that the small and flat lesions in the right                            colon could have been missed. The patient tolerated                            the procedure well. The Ileocecal valve,  appendiceal orifice were photographed. Scope In: 3:21:32 PM Scope Out: 3:42:23 PM Scope Withdrawal Time: 0 hours 9 minutes 41 seconds  Total Procedure Duration: 0 hours 20 minutes 51 seconds  Findings:                 Multiple medium-mouthed diverticula were found in                            the sigmoid colon, descending colon and few                            transverse colon.                           Non-bleeding internal hemorrhoids were found during                            retroflexion. The hemorrhoids were small and Grade                            I (internal hemorrhoids that do not prolapse).                           The exam was otherwise without abnormality on                            direct and retroflexion views. Complications:            No immediate complications. Estimated Blood Loss:     Estimated blood loss: none. Impression:               - Predominantly left colonic moderate                            diverticulosis.                            - Non-bleeding internal hemorrhoids.                           - The examination was otherwise normal on direct                            and retroflexion views. Highly redundant colon.                           - No specimens collected.                           - Examination of the right side of the colon was                            limited. Recommendation:           - Patient has a contact number available for                            emergencies. The signs and symptoms of  potential                            delayed complications were discussed with the                            patient. Return to normal activities tomorrow.                            Written discharge instructions were provided to the                            patient.                           - Resume previous diet.                           - Continue present medications.                           - Miralax 1 capful (17 grams) in 8 ounces of water                            PO daily.                           - Minimize narcotics.                           - If still with problems, trial of Movantik or                            Motegrity                           - I would have low threshold in repeating                            colonoscopy, with 2-day preparation, if any new                            symptoms. Otherwise would hold off d/t age.                           - The findings and recommendations were discussed                            with the patient's family. Lynann Bolognaajesh Denzil Mceachron, MD 01/31/2022 3:58:13 PM This report has been signed electronically.

## 2022-01-31 NOTE — Op Note (Addendum)
[facility] Patient Name: Alan Brown Procedure Date: 01/31/2022 2:34 PM MRN: [id number] Endoscopist: Lynann Bologna , MD Age: 71 Referring MD:  Date of Birth: 1951-02-18 Gender: Male Account #: [id number] Procedure:                Upper GI endoscopy Indications:              Intermittent N/V with neg CT AP Medicines:                Monitored Anesthesia Care Procedure:                Pre-Anesthesia Assessment:                           - Prior to the procedure, a History and Physical                            was performed, and patient medications and                            allergies were reviewed. The patient's tolerance of                            previous anesthesia was also reviewed. The risks                            and benefits of the procedure and the sedation                            options and risks were discussed with the patient.                            All questions were answered, and informed consent                            was obtained. Prior Anticoagulants: The patient has                            taken no previous anticoagulant or antiplatelet                            agents. ASA Grade Assessment: III - A patient with                            severe systemic disease. After reviewing the risks                            and benefits, the patient was deemed in                            satisfactory condition to undergo the procedure.                           After obtaining informed consent, the endoscope was  passed under direct vision. Throughout the                            procedure, the patient's blood pressure, pulse, and                            oxygen saturations were monitored continuously. The                            Endoscope was introduced through the mouth, and                            advanced to the second part of duodenum. The upper                            GI endoscopy was  accomplished without difficulty.                            The patient tolerated the procedure well. Scope In: Scope Out: Findings:                 The examined esophagus was normal with well-defined                            Z-line at 40 cm, examined by NBI.                           Localized mild inflammation characterized by                            erythema, friability and granularity was found in                            the gastric antrum. Biopsies were taken with a cold                            forceps for histology.                           Multiple 2 to 4 mm sessile polyps with no bleeding                            and no stigmata of recent bleeding were found in                            the gastric fundus and in the gastric body. Three                            polyps were removed with a cold biopsy forceps.                            Resection and retrieval were complete. Stomach was  J-shaped. No outlet obstruction.                           The examined duodenum was normal. Biopsies for                            histology were taken with a cold forceps for                            evaluation of celiac disease. Complications:            No immediate complications. Estimated Blood Loss:     Estimated blood loss: none. Impression:               - Mild gastritis.                           - Multiple gastric polyps. Resected and retrieved x                            3.                           - Normal examined duodenum. Biopsied. Recommendation:           - Patient has a contact number available for                            emergencies. The signs and symptoms of potential                            delayed complications were discussed with the                            patient. Return to normal activities tomorrow.                            Written discharge instructions were provided to the                            patient.                            - Resume previous diet.                           - Continue present medications.                           - Minimize narcotics.                           - Await pathology results.                           - The findings and recommendations were discussed  with the patient's family.                           - If still with problems, proceed with solid-phase                            gastric emptying scan. Lynann Bolognaajesh Roderica Cathell, MD 01/31/2022 3:45:35 PM This report has been signed electronically.

## 2022-01-31 NOTE — Patient Instructions (Addendum)
Information on diverticulosis and gastritis given to you today.  Await pathology results.  Resume previous diet and medications.  Use Miralax 1 capful (17 grams) in 8 oz of water each day.     YOU HAD AN ENDOSCOPIC PROCEDURE TODAY AT [facility]:   Refer to the procedure report that was given to you for any specific questions about what was found during the examination.  If the procedure report does not answer your questions, please call your gastroenterologist to clarify.  If you requested that your care partner not be given the details of your procedure findings, then the procedure report has been included in a sealed envelope for you to review at your convenience later.  YOU SHOULD EXPECT: Some feelings of bloating in the abdomen. Passage of more gas than usual.  Walking can help get rid of the air that was put into your GI tract during the procedure and reduce the bloating. If you had a lower endoscopy (such as a colonoscopy or flexible sigmoidoscopy) you may notice spotting of blood in your stool or on the toilet paper. If you underwent a bowel prep for your procedure, you may not have a normal bowel movement for a few days.  Please Note:  You might notice some irritation and congestion in your nose or some drainage.  This is from the oxygen used during your procedure.  There is no need for concern and it should clear up in a day or so.  SYMPTOMS TO REPORT IMMEDIATELY:  Following lower endoscopy (colonoscopy or flexible sigmoidoscopy):  Excessive amounts of blood in the stool  Significant tenderness or worsening of abdominal pains  Swelling of the abdomen that is new, acute  Fever of 100F or higher  Following upper endoscopy (EGD)  Vomiting of blood or coffee ground material  New chest pain or pain under the shoulder blades  Painful or persistently difficult swallowing  New shortness of breath  Fever of 100F or higher  Black, tarry-looking stools  For urgent  or emergent issues, a gastroenterologist can be reached at any hour by calling [phone]. Do not use MyChart messaging for urgent concerns.    DIET:  We do recommend a small meal at first, but then you may proceed to your regular diet.  Drink plenty of fluids but you should avoid alcoholic beverages for 24 hours.  ACTIVITY:  You should plan to take it easy for the rest of today and you should NOT DRIVE or use heavy machinery until tomorrow (because of the sedation medicines used during the test).    FOLLOW UP: Our staff will call the number listed on your records 48-72 hours following your procedure to check on you and address any questions or concerns that you may have regarding the information given to you following your procedure. If we do not reach you, we will leave a message.  We will attempt to reach you two times.  During this call, we will ask if you have developed any symptoms of COVID 19. If you develop any symptoms (ie: fever, flu-like symptoms, shortness of breath, cough etc.) before then, please call [phone].  If you test positive for Covid 19 in the 2 weeks post procedure, please call and report this information to [place].    If any biopsies were taken you will be contacted by phone or by letter within the next 1-3 weeks.  Please call us at [phone] if you have not heard about the biopsies in 3  weeks.    SIGNATURES/CONFIDENTIALITY: You and/or your care partner have signed paperwork which will be entered into your electronic medical record.  These signatures attest to the fact that that the information above on your After Visit Summary has been reviewed and is understood.  Full responsibility of the confidentiality of this discharge information lies with you and/or your care-partner.

## 2022-01-31 NOTE — Progress Notes (Signed)
[facility] History and Physical   Primary Care Physician:  Alroy Dust, L.Marlou Sa, MD  #60  71 year old white male with 5-week history of nausea, initially with upper abdominal discomfort which has resolved, vomiting now resolved and fullness Significant improvement in symptoms on metoclopramide 10 mg twice daily suggesting component of gastroparesis. Patient has also had new diagnosis of diabetes December 2022 Chronic narcotic use   Etiology of his symptoms is not entirely certain, surely he has decreased gastric emptying with chronic narcotics and numerous medications however he had not had any recent changes in those medications or dosages.   Will need to rule out peptic ulcer disease, occult neoplasm, possible medication effect secondary to Jardiance   #2 chronic GERD-on chronic omeprazole #3 Parkinson's disease #4 chronic pain syndrome, chronic back pain on chronic narcotics, muscle relaxers and Neurontin #5 bipolar disorder 6.  Recent pneumonia January 2023 #7 gallbladder sludge #8 remote history of EtOH abuse/sober x28 years #9 history of sessile serrated adenoma/last colonoscopy 2013 overdue for follow-up #10 diverticulosis   Plan; patient will be scheduled for CT of the abdomen and pelvis with contrast. Continue omeprazole 40 mg p.o. every morning I have refilled metoclopramide 10 mg p.o. for twice daily dosing #60 and 3 refills.  We discussed potential neurologic side effects with metoclopramide and he will notify us for any symptoms. Gastroparesis diet We also discussed indication for EGD and colonoscopy.  Patient will be scheduled for EGD and colonoscopy with Dr. Ardis Hughs.  Both procedures were discussed in detail including indications risks and benefits and he is agreeable to proceed. Patient is appropriate for endoscopic evaluation in the ambulatory care setting as of today's visit.     Amy Genia Harold PA-C 01/17/2022    HPI: Alan Brown is a 71 y.o.  male   Above note was reviewed  patient showed up for EGD/colonoscopy by mistake earlier  would proceed with the procedures since he is prepped.  His CT Abdo/pelvis was unremarkable except for colonic diverticulosis.  Past Medical History:  Diagnosis Date   Anxiety    Arthritis    "all through my body" (01/26/2014)   Bipolar 1 disorder (HCC)    Chronic lower back pain    Depression    Depression    GERD (gastroesophageal reflux disease)    Parkinson's disease ([place])    Pneumonia 1952; 11/2013   Prolapse, disk    lower back   Stroke [facility])    pt reported TIA    Past Surgical History:  Procedure Laterality Date   APPENDECTOMY  12/16/1959   COLONOSCOPY     INGUINAL HERNIA REPAIR Left 12/15/1996   SHOULDER ARTHROSCOPY WITH ROTATOR CUFF REPAIR AND SUBACROMIAL DECOMPRESSION Right 06/29/2019   Procedure: RIGHT SHOULDER ARTHROSCOPY WITH ROTATOR CUFF REPAIR, BICEPS TENO SUBACROMIAL DECOMPRESSION, DISTAL CLAVICLE EXCISION, EXTENSIVE DEBRIDEMENT;  Surgeon: Leandrew Koyanagi, MD;  Location: [facility];  Service: Orthopedics;  Laterality: Right;   TONSILLECTOMY  12/15/1974    Prior to Admission medications   Medication Sig Start Date End Date Taking? Authorizing Provider  albuterol (VENTOLIN HFA) 108 (90 Base) MCG/ACT inhaler Inhale 2 puffs into the lungs 4 (four) times daily as needed for wheezing or shortness of breath.     [provider]  aspirin EC 81 MG tablet Take 81 mg by mouth daily.    [provider]  atorvastatin (LIPITOR) 20 MG tablet Take 20 mg by mouth at bedtime.     [provider]  busPIRone (BUSPAR) 15 MG  tablet Take 15 mg by mouth 2 (two) times daily.    [provider]  Calcium Carbonate-Vitamin D 600-400 MG-UNIT tablet Take 1 tablet by mouth 2 (two) times daily.    [provider]  carbidopa-levodopa (SINEMET IR) 25-100 MG tablet Take 1 tablet by mouth 3 (three) times daily. Take 1 tablet at 8am/1pm/5pm     [provider]  diphenhydrAMINE (BENADRYL) 25 MG tablet Take 1 tablet (25 mg total) by mouth every 8 (eight) hours as needed. 01/04/22   Horton, Alvin Critchley, DO  divalproex (DEPAKOTE ER) 500 MG 24 hr tablet Take 500 mg by mouth every 12 (twelve) hours.    [provider]  DULoxetine (CYMBALTA) 60 MG capsule Take 60 mg by mouth daily. For mood and pain    [provider]  JARDIANCE 10 MG TABS tablet Take 10 mg by mouth daily. 01/02/22   [provider]  memantine (NAMENDA) 10 MG tablet Take 20 mg by mouth at bedtime.    [provider]  methocarbamol (ROBAXIN) 500 MG tablet Take 1,000 mg by mouth 2 (two) times daily.    [provider]  metoCLOPramide (REGLAN) 10 MG tablet Take 1 tablet (10 mg total) by mouth in the morning and at bedtime. 01/17/22   Esterwood, Amy S, PA-C  morphine (MS CONTIN) 15 MG 12 hr tablet Take 1 tablet by mouth in the morning and at bedtime. 08/09/20   [provider]  Multiple Vitamin (MULTIVITAMIN WITH MINERALS) TABS tablet Take 1 tablet by mouth daily.    [provider]  omeprazole (PRILOSEC) 40 MG capsule Take 40 mg by mouth 2 (two) times daily before a meal.    [provider]  Oxycodone HCl 20 MG TABS Take 1 tablet by mouth 5 (five) times daily. 08/09/20   [provider]  pregabalin (LYRICA) 300 MG capsule Take 300 mg by mouth at bedtime.    [provider]  primidone (MYSOLINE) 50 MG tablet Take 50 mg by mouth 3 (three) times daily. For tremors    [provider]  propranolol ER (INDERAL LA) 60 MG 24 hr capsule Take 60 mg by mouth daily.    [provider]  risperiDONE (RISPERDAL) 0.5 MG tablet Take 1 tablet (0.5 mg total) by mouth at bedtime as needed for up to 30 days (for agitation). 04/27/19 01/17/22  Amin, Jeanella Flattery, MD  Sod Picosulfate-Mag Ox-Cit Acd (CLENPIQ) 10-3.5-12 MG-GM -GM/160ML SOLN Take 1 kit by mouth as directed. 01/17/22   Esterwood, Amy S, PA-C   tamsulosin (FLOMAX) 0.4 MG CAPS capsule Take 0.4 mg by mouth at bedtime.     [provider]  Tapentadol HCl 100 MG TABS Take 150 mg by mouth 4 (four) times daily as needed (PAIN).     [provider]  Tiotropium Bromide Monohydrate 2.5 MCG/ACT AERS Inhale 2 puffs into the lungs daily.    [provider]  trifluoperazine (STELAZINE) 1 MG tablet Take 3 mg by mouth at bedtime.    [provider]    Current Outpatient Medications  Medication Sig Dispense Refill   albuterol (VENTOLIN HFA) 108 (90 Base) MCG/ACT inhaler Inhale 2 puffs into the lungs 4 (four) times daily as needed for wheezing or shortness of breath.      aspirin EC 81 MG tablet Take 81 mg by mouth daily.     atorvastatin (LIPITOR) 20 MG tablet Take 20 mg by mouth at bedtime.      busPIRone (BUSPAR) 15  MG tablet Take 15 mg by mouth 2 (two) times daily.     Calcium Carbonate-Vitamin D 600-400 MG-UNIT tablet Take 1 tablet by mouth 2 (two) times daily.     carbidopa-levodopa (SINEMET IR) 25-100 MG tablet Take 1 tablet by mouth 3 (three) times daily. Take 1 tablet at 8am/1pm/5pm     diphenhydrAMINE (BENADRYL) 25 MG tablet Take 1 tablet (25 mg total) by mouth every 8 (eight) hours as needed. 30 tablet 0   divalproex (DEPAKOTE ER) 500 MG 24 hr tablet Take 500 mg by mouth every 12 (twelve) hours.     DULoxetine (CYMBALTA) 60 MG capsule Take 60 mg by mouth daily. For mood and pain     JARDIANCE 10 MG TABS tablet Take 10 mg by mouth daily.     memantine (NAMENDA) 10 MG tablet Take 20 mg by mouth at bedtime.     methocarbamol (ROBAXIN) 500 MG tablet Take 1,000 mg by mouth 2 (two) times daily.     metoCLOPramide (REGLAN) 10 MG tablet Take 1 tablet (10 mg total) by mouth in the morning and at bedtime. 60 tablet 3   morphine (MS CONTIN) 15 MG 12 hr tablet Take 1 tablet by mouth in the morning and at bedtime.     Multiple Vitamin (MULTIVITAMIN WITH MINERALS) TABS tablet Take 1 tablet by mouth daily.      omeprazole (PRILOSEC) 40 MG capsule Take 40 mg by mouth 2 (two) times daily before a meal.     Oxycodone HCl 20 MG TABS Take 1 tablet by mouth 5 (five) times daily.     pregabalin (LYRICA) 300 MG capsule Take 300 mg by mouth at bedtime.     primidone (MYSOLINE) 50 MG tablet Take 50 mg by mouth 3 (three) times daily. For tremors     propranolol ER (INDERAL LA) 60 MG 24 hr capsule Take 60 mg by mouth daily.     risperiDONE (RISPERDAL) 0.5 MG tablet Take 1 tablet (0.5 mg total) by mouth at bedtime as needed for up to 30 days (for agitation). 30 tablet 0   Sod Picosulfate-Mag Ox-Cit Acd (CLENPIQ) 10-3.5-12 MG-GM -GM/160ML SOLN Take 1 kit by mouth as directed. 320 mL 0   tamsulosin (FLOMAX) 0.4 MG CAPS capsule Take 0.4 mg by mouth at bedtime.      Tapentadol HCl 100 MG TABS Take 150 mg by mouth 4 (four) times daily as needed (PAIN).      Tiotropium Bromide Monohydrate 2.5 MCG/ACT AERS Inhale 2 puffs into the lungs daily.     trifluoperazine (STELAZINE) 1 MG tablet Take 3 mg by mouth at bedtime.     Current Facility-Administered Medications  Medication Dose Route Frequency Provider Last Rate Last Admin   0.9 %  sodium chloride infusion  500 mL Intravenous Once Jackquline Denmark, MD        Allergies as of 01/31/2022   (No Known Allergies)    Family History  Problem Relation Age of Onset   Hypertension Mother    Hypertension Father    Aneurysm Father    Diabetes Sister    Other Sister        killed   Heart disease Sister    Colon cancer Neg Hx    Esophageal cancer Neg Hx    Pancreatic cancer Neg Hx    Stomach cancer Neg Hx     Social History   Socioeconomic History   Marital status: Married    Spouse name: Not on file   Number  of children: Not on file   Years of education: Not on file   Highest education level: Not on file  Occupational History   Occupation: disabled veteran  Tobacco Use   Smoking status: Former    Packs/day: 1.00    Years: 25.00    Pack years: 25.00    Types:  Cigarettes   Smokeless tobacco: Never   Tobacco comments:    01/26/2014 "quit smoking 15-16 yr ago"  Vaping Use   Vaping Use: Never used  Substance and Sexual Activity   Alcohol use: No    Comment:  "quit drinking 25 yr ago; I'm a recovering alcoholic"   Drug use: No   Sexual activity: Not Currently  Other Topics Concern   Not on file  Social History Narrative   Not on file   Social Determinants of Health   Financial Resource Strain: Not on file  Food Insecurity: Not on file  Transportation Needs: Not on file  Physical Activity: Not on file  Stress: Not on file  Social Connections: Not on file  Intimate Partner Violence: Not on file    Review of Systems: Positive for none All other review of systems negative except as mentioned in the HPI.  Physical Exam: Vital signs in last 24 hours: _0 @   General:   Alert,  Well-developed, well-nourished, pleasant and cooperative in NAD Lungs:  Clear throughout to auscultation.   Heart:  Regular rate and rhythm; no murmurs, clicks, rubs,  or gallops. Abdomen:  Soft, nontender and nondistended. Normal bowel sounds.   Neuro/Psych:  Alert and cooperative. Normal mood and affect. A and O x 3    No significant changes were identified.  The patient continues to be an appropriate candidate for the planned procedure and anesthesia.   Carmell Austria, MD. [facility] 01/31/2022 2:35 PM@

## 2022-01-31 NOTE — Progress Notes (Signed)
Pt in recovery with monitors in place, VSS. Report given to receiving RN. Bite guard was placed with pt awake to ensure comfort. No dental or soft tissue damage noted. 

## 2022-01-31 NOTE — Progress Notes (Signed)
Called to room to assist during endoscopic procedure.  Patient ID and intended procedure confirmed with present staff. Received instructions for my participation in the procedure from the performing physician.  

## 2022-02-03 ENCOUNTER — Other Ambulatory Visit: Payer: Self-pay

## 2022-02-03 DIAGNOSIS — K5730 Diverticulosis of large intestine without perforation or abscess without bleeding: Secondary | ICD-10-CM

## 2022-02-03 MED ORDER — POLYETHYLENE GLYCOL 3350 17 G PO PACK: PACK | ORAL | Status: DC

## 2022-02-05 ENCOUNTER — Telehealth: Payer: Self-pay | Admitting: Pulmonary Disease

## 2022-02-05 ENCOUNTER — Telehealth: Payer: Self-pay

## 2022-02-05 ENCOUNTER — Telehealth: Payer: Self-pay | Admitting: *Deleted

## 2022-02-05 DIAGNOSIS — K5903 Drug induced constipation: Secondary | ICD-10-CM | POA: Diagnosis not present

## 2022-02-05 DIAGNOSIS — G894 Chronic pain syndrome: Secondary | ICD-10-CM | POA: Diagnosis not present

## 2022-02-05 DIAGNOSIS — Z79891 Long term (current) use of opiate analgesic: Secondary | ICD-10-CM | POA: Diagnosis not present

## 2022-02-05 DIAGNOSIS — M25511 Pain in right shoulder: Secondary | ICD-10-CM | POA: Diagnosis not present

## 2022-02-05 DIAGNOSIS — M47812 Spondylosis without myelopathy or radiculopathy, cervical region: Secondary | ICD-10-CM | POA: Diagnosis not present

## 2022-02-05 DIAGNOSIS — Z9889 Other specified postprocedural states: Secondary | ICD-10-CM | POA: Diagnosis not present

## 2022-02-05 DIAGNOSIS — M47817 Spondylosis without myelopathy or radiculopathy, lumbosacral region: Secondary | ICD-10-CM | POA: Diagnosis not present

## 2022-02-05 NOTE — Telephone Encounter (Signed)
  Follow up Call-  LMOM to call back with any questions or concerns.  Also, call back if patient has developed fever, respiratory issues or been dx with COVID or had any family members or close contacts diagnosed since her procedure.  

## 2022-02-05 NOTE — Telephone Encounter (Signed)
Left message on follow up call. 

## 2022-02-06 ENCOUNTER — Encounter (HOSPITAL_COMMUNITY): Payer: Self-pay

## 2022-02-06 ENCOUNTER — Other Ambulatory Visit: Payer: Self-pay

## 2022-02-06 ENCOUNTER — Ambulatory Visit (HOSPITAL_COMMUNITY)
Admission: RE | Admit: 2022-02-06 | Discharge: 2022-02-06 | Disposition: A | Discharge status: Home / Self Care | Payer: No Typology Code available for payment source | Source: Ambulatory Visit | Attending: Pulmonary Disease | Admitting: Pulmonary Disease

## 2022-02-06 DIAGNOSIS — R911 Solitary pulmonary nodule: Secondary | ICD-10-CM | POA: Diagnosis present

## 2022-02-06 DIAGNOSIS — R918 Other nonspecific abnormal finding of lung field: Secondary | ICD-10-CM | POA: Diagnosis present

## 2022-02-06 NOTE — Telephone Encounter (Signed)
Pt aware will close the note

## 2022-02-08 ENCOUNTER — Other Ambulatory Visit: Payer: Self-pay | Admitting: Nurse Practitioner

## 2022-02-08 DIAGNOSIS — M25511 Pain in right shoulder: Secondary | ICD-10-CM

## 2022-02-10 ENCOUNTER — Encounter: Payer: Self-pay | Admitting: Gastroenterology

## 2022-02-14 ENCOUNTER — Telehealth: Payer: Self-pay | Admitting: Acute Care

## 2022-02-14 ENCOUNTER — Ambulatory Visit (INDEPENDENT_AMBULATORY_CARE_PROVIDER_SITE_OTHER): Payer: No Typology Code available for payment source | Admitting: Acute Care

## 2022-02-14 ENCOUNTER — Encounter: Payer: Self-pay | Admitting: Acute Care

## 2022-02-14 ENCOUNTER — Other Ambulatory Visit: Payer: Self-pay

## 2022-02-14 VITALS — BP 132/80 | HR 75 | Ht 70.0 in | Wt 217.0 lb

## 2022-02-14 DIAGNOSIS — R911 Solitary pulmonary nodule: Secondary | ICD-10-CM | POA: Diagnosis not present

## 2022-02-14 DIAGNOSIS — Z87891 Personal history of nicotine dependence: Secondary | ICD-10-CM | POA: Diagnosis not present

## 2022-02-14 NOTE — Telephone Encounter (Signed)
I have called the patient with the results of his CT scan that were unavailable this morning when he was here for an appointment.  I explained that there was notation of 2 nodules one in the right upper lobe that is 0.5 cm and 1 in the left upper lobe that is 0.2 cm.  Recommendation by radiology is for a 6 to 66-month follow-up, however I explained to him that once we have reviewed the scans done at the [facility] follow-up may be sooner if the nodules have grown in comparison to previous scans. ?I also let him know there was notation of bronchiectasis with questionable aspiration.  He denies any coughing or choking while he eats.   ?Additionally, I let him know there was notation of coronary artery disease on his scan.  He is currently being treated with statin therapy. ?Patient has gone to the [place] and signed paperwork to allow release of records to this practice. ? ?Patient verbalized understanding of the above and had no further questions.  He understands follow-up CT scan will be based on comparison of previous scans to the one that was done in February 2023. ? ?Barnet Pall, [facility] [place] and request [facility]ed copies of the reports of previous CT chest scans be sent as soon as possible. ?We can at least use these to compare to current reading.  Once we get the images which will take about 2 weeks we will have more information, but I would like them faxed reports as soon as possible.  Thank you ?

## 2022-02-14 NOTE — Progress Notes (Signed)
History of Present Illness Alan Brown is a 71 y.o. male former smoker with past medical history of depression, GERD, TIA.  Patient was referred for evaluation of abnormal CT imaging.  From what I can tell he had a full CT chest in April 2022 completed at the [facility] system which showed a right lower lobe groundglass opacity.  He is a former smoker quit 23 years ago.  He served in the [place] [employer] during [place].  He also had pneumonia this past year had a CT in in January with a follow-up image of the lower lobe abdominal CT which showed partial resolution of the inflammatory infiltrates in the right lower lobe.    02/14/2022 Pt. Presents today for follow up. He had a CT scan 02/06/2022 to re-evaluate a nodule that is being followed through the [place] system. The scan had not been read as there was no scan in the system to compare this to. Al previous imaging has been done at the [place] in [place]. The patient was very upset. I have told him I will call him with the scan results, but it will just be a reading , as we have no way to determine if the nodule has grown.  I have asked the patient to call the [place] today, and request the previous scans be sent for comparison. I told him he would  base follow up on comparison to his previous scans. I also told him I would call with the results of his scan once I had them, as I have called [facility] imaging, and they were getting the film read today. Pt. States he has been stable from a pulmonary perspective. He is compliant with his albuterol as needed.   Test Results: 02/06/2022 CT Chest Mild-to-moderate patchy ground-glass tree-in-bud opacities throughout the right middle lobe and right lower lobe with mild cylindrical bronchiectasis in the right lower lobe. Findings suggest nonspecific mild infectious or inflammatory bronchiolitis, potentially chronic/recurrent given the mild bronchiectasis, such as due to recurrent aspiration. 2. Posterior right upper  lobe 0.5 cm indistinct pulmonary nodule, obscured by atelectasis on prior 2020 chest CT angiogram study. Tiny 0.2 cm anterior left upper lobe solid pulmonary nodule, new. Suggest attention on follow-up chest CT in 6-12 months in this high risk patient. 3. Mild mediastinal lymphadenopathy is stable since 04/22/2019 chest CT, most compatible with benign reactive adenopathy. 4. Three-vessel coronary atherosclerosis. 5. Dilated 4.2 cm ascending thoracic aorta, stable. Recommend annual imaging followup by CTA or MRA. This recommendation follows 2010   CT Chest  03/25/2021: Impression: 1. Persistent groundglass opacification in the basilar RML is  nonspecific. New small patch of infectious or inflammatory  bronchiolitis in the basilar LLL. In addition to typical  infectious agents also consider atypical infectious agents such  as MAC/nontuberculous mycobacterium.  2. Mild pulmonary emphysema.   CBC Latest Ref Rng & Units 01/04/2022 12/22/2021 11/04/2021  WBC 4.0 - 10.5 K/uL 11.5(H) 12.5(H) 9.8  Hemoglobin 13.0 - 17.0 g/dL 13.7 12.2(L) 13.6  Hematocrit 39.0 - 52.0 % 43.4 39.0 41.9  Platelets 150 - 400 K/uL 365 242 238    BMP Latest Ref Rng & Units 01/04/2022 12/22/2021 11/04/2021  Glucose 70 - 99 mg/dL 103(H) 141(H) 184(H)  BUN 8 - 23 mg/dL 15 5(L) 17  Creatinine 0.61 - 1.24 mg/dL 1.03 1.03 0.96  Sodium 135 - 145 mmol/L 135 137 135  Potassium 3.5 - 5.1 mmol/L 4.0 3.9 4.2  Chloride 98 - 111 mmol/L 99 98 104  CO2 22 -  32 mmol/L 27 32 25  Calcium 8.9 - 10.3 mg/dL 9.6 8.9 8.6(L)    BNP    Component Value Date/Time   BNP 40.2 11/04/2021 2328    ProBNP    Component Value Date/Time   PROBNP 63.5 01/23/2014 1428    PFT No results found for: FEV1PRE, FEV1POST, FVCPRE, FVCPOST, TLC, DLCOUNC, PREFEV1FVCRT, PSTFEV1FVCRT  CT Abdomen Pelvis W Contrast  Result Date: 01/22/2022 CLINICAL DATA:  Epigastric pain for 2 months. Nausea and vomiting. Anorexia. Previous appendectomy and bilateral  inguinal hernia repairs. EXAM: CT ABDOMEN AND PELVIS WITH CONTRAST TECHNIQUE: Multidetector CT imaging of the abdomen and pelvis was performed using the standard protocol following bolus administration of intravenous contrast. RADIATION DOSE REDUCTION: This exam was performed according to the departmental dose-optimization program which includes automated exposure control, adjustment of the mA and/or kV according to patient size and/or use of iterative reconstruction technique. CONTRAST:  141m OMNIPAQUE IOHEXOL 300 MG/ML  SOLN COMPARISON:  12/22/2021 [facility] Chest: Right lower lobe infiltrate has nearly completely resolved since prior study, consistent with resolving pneumonia. Hepatobiliary: No hepatic masses identified. Gallbladder is unremarkable. No evidence of biliary ductal dilatation. Pancreas:  No mass or inflammatory changes. Spleen: Within normal limits in size and appearance. Adrenals/Urinary Tract: No masses identified. No evidence of ureteral calculi or hydronephrosis. Stomach/Bowel: No evidence of obstruction, inflammatory process or abnormal fluid collections. Diffuse colonic diverticulosis is again noted, however there is no evidence of diverticulitis. Vascular/Lymphatic: No pathologically enlarged lymph nodes. No acute vascular findings. Aortic atherosclerotic calcification noted. Reproductive:  No mass or other significant abnormality. Other:  None. Musculoskeletal:  No suspicious bone lesions identified. IMPRESSION: No acute findings within the abdomen or pelvis. Colonic diverticulosis, without radiographic evidence of diverticulitis. Near complete resolution of right lower lobe infiltrate, consistent with resolving pneumonia. Aortic Atherosclerosis (ICD10-I70.0). Electronically Signed   By: JMarlaine HindM.D.   On: 01/22/2022 15:34     Past medical hx Past Medical History:  Diagnosis Date   Anxiety    Arthritis    "all through my body"  (01/26/2014)   Bipolar 1 disorder (HCC)    Chronic lower back pain    Depression    Depression    GERD (gastroesophageal reflux disease)    Parkinson's disease ([facility]    Pneumonia 1952; 11/2013   Prolapse, disk    lower back   Stroke ([facility]    pt reported TIA     Social History   Tobacco Use   Smoking status: Former    Packs/day: 1.00    Years: 25.00    Pack years: 25.00    Types: Cigarettes   Smokeless tobacco: Never   Tobacco comments:    01/26/2014 "quit smoking 15-16 yr ago"  Vaping Use   Vaping Use: Never used  Substance Use Topics   Alcohol use: No    Comment:  "quit drinking 25 yr ago; I'm a recovering alcoholic"   Drug use: No    Mr.AHeviareports that he has quit smoking. His smoking use included cigarettes. He has a 25.00 pack-year smoking history. He has never used smokeless tobacco. He reports that he does not drink alcohol and does not use drugs.  Tobacco Cessation: Former smoker , quit 2015 with a 25 pack year smoking history  Past surgical hx, Family hx, Social hx all reviewed.  Current Outpatient Medications on File Prior to Visit  Medication Sig   acetaminophen (TYLENOL) 325 MG tablet TAKE TWO TABLETS BY  MOUTH EVERY 6 HOURS FOR PAIN, ACHE, FEVER MAXIMUM 8 PILLS A DAY.   albuterol (VENTOLIN HFA) 108 (90 Base) MCG/ACT inhaler Inhale 2 puffs into the lungs 4 (four) times daily as needed for wheezing or shortness of breath.    ARIPiprazole (ABILIFY) 5 MG tablet Take 5 mg by mouth at bedtime.   aspirin EC 81 MG tablet Take 81 mg by mouth daily.   atorvastatin (LIPITOR) 20 MG tablet Take 20 mg by mouth at bedtime.    busPIRone (BUSPAR) 15 MG tablet Take 15 mg by mouth 2 (two) times daily.   Calcium Carb-Cholecalciferol 600-10 MG-MCG TABS Take 1 tablet by mouth 2 (two) times daily.   Calcium Carbonate-Vitamin D 600-400 MG-UNIT tablet Take 1 tablet by mouth 2 (two) times daily.   carbidopa-levodopa (SINEMET IR) 25-100 MG tablet Take 1 tablet by mouth 3 (three)  times daily. Take 1 tablet at 8am/1pm/5pm   diphenhydrAMINE (BENADRYL) 25 MG tablet Take 1 tablet (25 mg total) by mouth every 8 (eight) hours as needed.   DULoxetine (CYMBALTA) 60 MG capsule Take 60 mg by mouth daily. For mood and pain   JARDIANCE 10 MG TABS tablet Take 10 mg by mouth daily.   memantine (NAMENDA) 10 MG tablet Take 20 mg by mouth at bedtime.   memantine (NAMENDA) 10 MG tablet TAKE ONE AND ONE-HALF TABLETS BY MOUTH AT BEDTIME (INCREASED DOSE)   methocarbamol (ROBAXIN) 500 MG tablet Take 1,000 mg by mouth 2 (two) times daily.   metoCLOPramide (REGLAN) 10 MG tablet Take 1 tablet (10 mg total) by mouth in the morning and at bedtime.   morphine (MS CONTIN) 15 MG 12 hr tablet Take 1 tablet by mouth in the morning and at bedtime.   Multiple Vitamin (MULTIVITAMIN WITH MINERALS) TABS tablet Take 1 tablet by mouth daily.   omeprazole (PRILOSEC) 40 MG capsule Take 40 mg by mouth 2 (two) times daily before a meal.   Oxycodone HCl 20 MG TABS Take 1 tablet by mouth 5 (five) times daily.   oxyCODONE-acetaminophen (PERCOCET) 10-325 MG tablet Take 1 tablet by mouth 2 (two) times daily.   polyethylene glycol (MIRALAX) 17 g packet 17 grams in 8 oz of water daily   pregabalin (LYRICA) 300 MG capsule Take 300 mg by mouth at bedtime.   primidone (MYSOLINE) 50 MG tablet Take 50 mg by mouth 3 (three) times daily. For tremors   propranolol ER (INDERAL LA) 60 MG 24 hr capsule Take 60 mg by mouth daily.   rasagiline (AZILECT) 0.5 MG TABS tablet TAKE TWO TABLETS BY MOUTH ONCE A DAY TO SLOW PROGRESSION OF PARKINSON'S DISEASE   tamsulosin (FLOMAX) 0.4 MG CAPS capsule Take 0.4 mg by mouth at bedtime.    Tapentadol HCl 100 MG TABS Take 150 mg by mouth 4 (four) times daily as needed (PAIN).    Tiotropium Bromide Monohydrate 2.5 MCG/ACT AERS Inhale 2 puffs into the lungs daily.   trifluoperazine (STELAZINE) 1 MG tablet Take 3 mg by mouth at bedtime.   calcium carbonate (OSCAL) 1500 (600 Ca) MG TABS tablet Take  by mouth.   cyclobenzaprine (FLEXERIL) 10 MG tablet Take 10 mg by mouth 3 (three) times daily as needed.   gabapentin (NEURONTIN) 300 MG capsule Take by mouth.   mirtazapine (REMERON) 30 MG tablet Take 30 mg by mouth at bedtime.   mupirocin ointment (BACTROBAN) 2 % SMARTSIG:Sparingly Topical 2-3 Times Daily PRN   RELISTOR 150 MG TABS Take 3 tablets by mouth daily.   risperiDONE (  RISPERDAL) 0.5 MG tablet Take 1 tablet (0.5 mg total) by mouth at bedtime as needed for up to 30 days (for agitation).   No current facility-administered medications on file prior to visit.     No Known Allergies  Review Of Systems:  Constitutional:   No  weight loss, night sweats,  Fevers, chills, fatigue, or  lassitude.  HEENT:   No headaches,  Difficulty swallowing,  Tooth/dental problems, or  Sore throat,                No sneezing, itching, ear ache, nasal congestion, post nasal drip,   CV:  No chest pain,  Orthopnea, PND, swelling in lower extremities, anasarca, dizziness, palpitations, syncope.   GI  No heartburn, indigestion, abdominal pain, nausea, vomiting, diarrhea, change in bowel habits, loss of appetite, bloody stools.   Resp: + shortness of breath with exertion less at rest.  No excess mucus, no productive cough,  No non-productive cough,  No coughing up of blood.  No change in color of mucus.  No wheezing.  No chest wall deformity  Skin: no rash or lesions.  GU: no dysuria, change in color of urine, no urgency or frequency.  No flank pain, no hematuria   MS:  No joint pain or swelling.  No decreased range of motion.  No back pain.  Psych:  No change in mood or affect. No depression or anxiety.  No memory loss.   Vital Signs BP 132/80    Pulse 75    Ht _0  (1.778 m)    Wt 217 lb (98.4 kg)    SpO2 94%    BMI 31.14 kg/m    Physical Exam:  General- No distress,  A&Ox3, pleasant ENT: No sinus tenderness, TM clear, pale nasal mucosa, no oral exudate,no post nasal drip, no LAN Cardiac:  S1, S2, regular rate and rhythm, no murmur Chest: No wheeze/ rales/ dullness; no accessory muscle use, no nasal flaring, no sternal retractions Abd.: Soft Non-tender, ND, BS +  Body mass index is 31.14 kg/m.  Ext: No clubbing cyanosis, edema Neuro:  normal strength, MAE x 4, A&O x 3 Skin: No rashes, warm and dry, No lesions Psych: normal mood and behavior   Assessment/Plan Incidental finding of a Lung Nodule in a former smoker Plan The CT scan you had done 2/23 has not been read by radiology. The reason is that they need your prior CT scans to compare your most recent CT, in order to determine if your lung nodule has grown.  Please call the [facility] today, and ask them to send [facility] a CD with your prior CT Chest scans.  This way we can compare them to your most recent CT done 02/06/2022. You will most likely have to sign a release to have this health information shared with [place].  If the nodule has grown, the next step will be to do a PET scan.  I will call you with the results of the scan once it has been read.  Follow up with Judson Roch NP in 1 month to ensure we have been able to compare scans, and we have the appropriate plan of care scheduled.  Please contact office for sooner follow up if symptoms do not improve or worsen or seek emergency care    I spent 50 minutes dedicated to the care of this patient on the date of this encounter to include pre-visit review of records, face-to-face time with the patient discussing conditions above, post visit  ordering of testing, clinical documentation with the electronic health record, making appropriate referrals as documented, and communicating necessary information to the patient's healthcare team.   Magdalen Spatz, NP 02/14/2022  9:32 AM   Addendum 17:05  I have called the patient with the results of his CT scan.  I explained that there was notation of 2 nodules one in the right upper lobe that is 0.5 cm and 1 in the left upper lobe that is  0.2 cm.  Recommendation by radiology is for a 6 to 6-monthfollow-up, however I explained to him that [facility] if the nodules have grown in comparison to previous scans. I also let him know there was notation of bronchiectasis with questionable aspiration.  He denies any coughing or choking while he eats.   Additionally, I let him know there was notation of coronary artery disease on his scan.  He is currently being treated with statin therapy. [facility]   Patient verbalized understanding of the above and had no further questions.  He understands follow-up CT scan will be based on comparison of previous scans to the one that was done in February 2023.

## 2022-02-14 NOTE — Patient Instructions (Signed)
It is good to see you today. ?The CT scan you had done 2/23 has not been read by radiology. ?The reason is that they need your prior CT scans to compare your most recent CT, in order to determine if your lung nodule has grown.  ?[facility] a CD with your prior CT Chest scans.  ?This way we can compare them to your most recent CT done 02/06/2022. ?You will most likely have to sign a release to have this health information shared with [place].  ?If the nodule has grown, the next step will be to do a PET scan.  ?I will call you with the results of the scan once it has been read.  ?Follow up with Maralyn Sago NP in 1 month to ensure we have been able to compare scans, and we have the appropriate plan of care scheduled.  ?Please contact office for sooner follow up if symptoms do not improve or worsen or seek emergency care   ?

## 2022-02-19 NOTE — Telephone Encounter (Signed)
I called and spoke with the wife and she reports that Alan Brown had went to sign the records release to get the scans sent over to the office. I let the wife now that I will reach out to them once we hear something back.  ?

## 2022-02-23 ENCOUNTER — Ambulatory Visit
Admission: RE | Admit: 2022-02-23 | Discharge: 2022-02-23 | Disposition: A | Discharge status: Home / Self Care | Payer: Medicare PPO | Source: Ambulatory Visit | Attending: Nurse Practitioner | Admitting: Nurse Practitioner

## 2022-02-23 ENCOUNTER — Other Ambulatory Visit: Payer: Self-pay

## 2022-02-23 DIAGNOSIS — M25511 Pain in right shoulder: Secondary | ICD-10-CM | POA: Diagnosis not present

## 2022-02-28 ENCOUNTER — Encounter: Payer: Medicare PPO | Admitting: Gastroenterology

## 2022-02-28 DIAGNOSIS — I77819 Aortic ectasia, unspecified site: Secondary | ICD-10-CM | POA: Diagnosis not present

## 2022-02-28 DIAGNOSIS — I8310 Varicose veins of unspecified lower extremity with inflammation: Secondary | ICD-10-CM | POA: Diagnosis not present

## 2022-02-28 DIAGNOSIS — R911 Solitary pulmonary nodule: Secondary | ICD-10-CM | POA: Diagnosis not present

## 2022-02-28 DIAGNOSIS — I2510 Atherosclerotic heart disease of native coronary artery without angina pectoris: Secondary | ICD-10-CM | POA: Diagnosis not present

## 2022-02-28 DIAGNOSIS — R600 Localized edema: Secondary | ICD-10-CM | POA: Diagnosis not present

## 2022-02-28 DIAGNOSIS — J479 Bronchiectasis, uncomplicated: Secondary | ICD-10-CM | POA: Diagnosis not present

## 2022-03-05 DIAGNOSIS — M47812 Spondylosis without myelopathy or radiculopathy, cervical region: Secondary | ICD-10-CM | POA: Diagnosis not present

## 2022-03-05 DIAGNOSIS — Z79891 Long term (current) use of opiate analgesic: Secondary | ICD-10-CM | POA: Diagnosis not present

## 2022-03-05 DIAGNOSIS — M25511 Pain in right shoulder: Secondary | ICD-10-CM | POA: Diagnosis not present

## 2022-03-05 DIAGNOSIS — Z9889 Other specified postprocedural states: Secondary | ICD-10-CM | POA: Diagnosis not present

## 2022-03-05 DIAGNOSIS — M47817 Spondylosis without myelopathy or radiculopathy, lumbosacral region: Secondary | ICD-10-CM | POA: Diagnosis not present

## 2022-03-05 DIAGNOSIS — K5903 Drug induced constipation: Secondary | ICD-10-CM | POA: Diagnosis not present

## 2022-03-05 DIAGNOSIS — G894 Chronic pain syndrome: Secondary | ICD-10-CM | POA: Diagnosis not present

## 2022-03-07 NOTE — Telephone Encounter (Signed)
The CD's are in your cabinet. FYI.  ?

## 2022-03-07 NOTE — Telephone Encounter (Signed)
I called the spouse and she is aware that we have the CD's for the CT scan. April has put the CD's in [facility] box to review. Nothing further needed. ?

## 2022-03-12 ENCOUNTER — Telehealth: Payer: Self-pay | Admitting: Pulmonary Disease

## 2022-03-12 DIAGNOSIS — R911 Solitary pulmonary nodule: Secondary | ICD-10-CM

## 2022-03-13 NOTE — Telephone Encounter (Signed)
Spoke with the pt ?He is requesting his CT chest results  ?He had his scan on 02/17/22 and is upset no results provided to him yet  ?He wants to have the results called to him today  ?Looks like radiology already compared this scan with previous one from the [place]  ?Please advise, thanks! ? ? ?

## 2022-03-13 NOTE — Telephone Encounter (Signed)
Josephine Igo, DO ?to Me  [facility] Triage Pool   ?  10:40 AM ?  ?I reviewed his CT imaging.  ? ?Predominately inflammatory changes.  ?2 small nodules that will need ct follow up.  ?He will need a CT chest repeat in 6-12 months  ? ?We will discuss this at next office visit and review his images with him  ? ?Thanks  ? ?BLI  ? ?Josephine Igo, DO  ?[facility]  ?03/13/2022 10:40 AM  ? ?I called and spoke with the pt and notified of results of ct per Dr Tonia Brooms. He verbalized understanding.  ? ?   ?

## 2022-03-18 ENCOUNTER — Encounter: Payer: Self-pay | Admitting: Adult Health

## 2022-03-18 ENCOUNTER — Ambulatory Visit (INDEPENDENT_AMBULATORY_CARE_PROVIDER_SITE_OTHER): Payer: No Typology Code available for payment source | Admitting: Adult Health

## 2022-03-18 VITALS — BP 108/60 | HR 75 | Temp 98.0°F | Ht 70.0 in | Wt 218.0 lb

## 2022-03-18 DIAGNOSIS — R911 Solitary pulmonary nodule: Secondary | ICD-10-CM

## 2022-03-18 DIAGNOSIS — J439 Emphysema, unspecified: Secondary | ICD-10-CM

## 2022-03-18 DIAGNOSIS — R918 Other nonspecific abnormal finding of lung field: Secondary | ICD-10-CM

## 2022-03-18 DIAGNOSIS — J479 Bronchiectasis, uncomplicated: Secondary | ICD-10-CM

## 2022-03-18 NOTE — Assessment & Plan Note (Signed)
Mild bronchiectasis noted on CT chest.  Patient does have underlying COPD.  He has minimum symptoms and cough.  Have added a flutter valve to use if cough or congestion develop.  Advised to use Mucinex or Robitussin for mucociliary clearance along with his flutter valve.  He does have underlying Parkinson's which can increase his risk for potential aspiration.  We went over aspiration precautions in detail. ?CT chest does show some patchy groundglass opacities which may be post infectious/inflammatory changes however aspiration could also cause similar appearance. ? ?Plan  ?Patient Instructions  ?CT chest without contrast in 6 months to follow lung nodules  ? ?Continue on Spiriva daily  ?Add Flutter valve Twice daily  As needed  cough/congestion  ?Mucinex DM Twice daily  As needed  cough/congestion  ?Aspiration precautions -stay upright 1-2 hrs after eating , no eating 2-3hr prior to bedtime .  ? ?Follow up with Dr. Valeta Harms in 6 months and As needed   ? ?  ? ?

## 2022-03-18 NOTE — Assessment & Plan Note (Signed)
COPD with emphysema managed at the [facility] system.  Continue on Spiriva. ?

## 2022-03-18 NOTE — Progress Notes (Signed)
Patient seen in the office today and instructed on use of flutter valve.  Patient expressed understanding and demonstrated technique.  

## 2022-03-18 NOTE — Assessment & Plan Note (Signed)
CT chest dating back to 2021 on reports from the [place] system does show some small nodularity.  Most recent CT chest February 06, 2022 at [facility] with comparison from 2020 CT chest showed stable nodularity and a new 0.2 mm left upper lobe nodule .  Patient will need a follow-up CT chest in 6 months. ?CT was reviewed in detail by Dr. Tonia Brooms. ? ?

## 2022-03-18 NOTE — Progress Notes (Signed)
? ?@Patient  ID: , male    DOB: 10/17/1951, 71 y.o.   MRN: [id number] ? ?Chief Complaint  ?Patient presents with  ? Follow-up  ? ? ?Referring provider: ?Mitchell, [id number], MD ? 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  Served during [employer].  He is a disabled [place]. ?Medical history significant for COPD with emphysema, Parkinson's ? ?TEST/EVENTS :  ? ?03/18/2022 Follow up ; lung nodule, abnormal CT chest ?Patient returns for 1 month follow-up.  Patient was seen in February for pulmonary consult for abnormal CT chest.  He is followed at the [facility] system in [place].  Patient was treated for pneumonia in January 2023.  A CT abdomen showed partial clearance of right lower lobe infiltrates.  Patient was set up for a dedicated CT chest February 06, 2022 that showed mild emphysema.  Mild to moderate patchy groundglass opacities throughout the right middle, right lower lobe and mild bronchiectasis in the right lower lobe.  Findings suggest a nonspecific infectious/inflammatory bronchiolitis.  0.3 left upper lobe stable nodule.  New 0.82mm  left upper lobe nodule.  And stable 0.5 right upper lobe nodule.  Several small perifissural nodules largest 0.5 cm.  Stable.  Per Dr. 3m review felt to be predominantly inflammatory changes and will need a follow-up CT chest in 6 months.  We discussed his CT results.  Patient denies any hemoptysis, unintentional weight loss. ?Per CT report from the [facility] CT dating back to CT January 23, 2020 patchy areas of nodular opacities with subtle groundglass opacities involving the right middle lobe, right upper and lower lobes.  Findings are nonspecific in the setting of infectious/inflammatory etiologies. ? ? ?Patient has underlying COPD with emphysema.  He is on Spiriva.  He denies any flare of cough or wheezing.  Patient says he is not very active due to his  Parkinson's.  He denies any known swallow issues.  We did discuss aspiration precautions.  Along with mucociliary clearance. ? ? ?No Known Allergies ? ?Immunization History  ?Administered Date(s) Administered  ? Fluad Quad(high Dose 65+) 08/28/2019  ? H1N1 12/11/2008  ? Influenza Split 08/30/2008, 09/03/2009, 09/19/2015, 09/24/2016, 09/08/2017, 09/23/2017, 09/02/2018, 10/02/2021  ? Influenza, High Dose Seasonal PF 09/14/2016, 09/08/2017, 08/15/2018, 09/02/2018, 08/24/2020  ? Influenza, Seasonal, Injecte, Preservative Fre 08/26/2011  ? Influenza,inj,quad, With Preservative 08/29/2014  ? Influenza-Unspecified 09/14/2001, 09/28/2001, 09/14/2002, 01/02/2004, 10/07/2004, 10/08/2005, 10/05/2006, 09/15/2007, 09/27/2008, 09/14/2009, 09/14/2010, 09/13/2012, 09/30/2013, 09/14/2014, 10/09/2016, 09/12/2019, 08/15/2020, 09/20/2021  ? Moderna Covid-19 Vaccine Bivalent Booster 63yrs & up 11/21/2021  ? Moderna Sars-Covid-2 Vaccination 01/28/2020, 02/25/2020, 09/08/2020, 04/01/2021  ? Pneumococcal Conjugate-13 07/09/2015, 10/31/2015, 09/02/2017  ? Pneumococcal Polysaccharide-23 08/26/2011, 01/24/2014, 04/10/2017, 10/07/2018  ? Td 12/16/1999  ? Tdap 12/03/2010, 08/29/2014, 09/02/2017  ? Zoster, Live 01/13/2011, 10/15/2012, 08/24/2020, 11/23/2020  ? ? ?Past Medical History:  ?Diagnosis Date  ? Anxiety   ? Arthritis   ? "all through my body" (01/26/2014)  ? Bipolar 1 disorder (HCC)   ? Chronic lower back pain   ? Depression   ? Depression   ? GERD (gastroesophageal reflux disease)   ? Parkinson's disease (HCC)   ? Pneumonia 1952; 11/2013  ? Prolapse, disk   ? lower back  ? Stroke [facility])   ? pt reported TIA  ? ? ?Tobacco History: ?Social History  ? ?Tobacco Use  ?Smoking Status Former  ? Packs/day: 1.00  ? Years: 25.00  ? Pack years: 25.00  ?  Types: Cigarettes  ?Smokeless Tobacco Never  ?Tobacco Comments  ? 01/26/2014 "quit smoking 4539yrs ago"  ? ?Counseling given: Not Answered ?Tobacco comments: 01/26/2014 "quit smoking 7339yrs  ago" ? ? ?Outpatient Medications Prior to Visit  ?Medication Sig Dispense Refill  ? acetaminophen (TYLENOL) 325 MG tablet TAKE TWO TABLETS BY MOUTH EVERY 6 HOURS FOR PAIN, ACHE, FEVER MAXIMUM 8 PILLS A DAY.    ? albuterol (VENTOLIN HFA) 108 (90 Base) MCG/ACT inhaler Inhale 2 puffs into the lungs 4 (four) times daily as needed for wheezing or shortness of breath.     ? ARIPiprazole (ABILIFY) 5 MG tablet Take 5 mg by mouth at bedtime.    ? aspirin EC 81 MG tablet Take 81 mg by mouth daily.    ? atorvastatin (LIPITOR) 20 MG tablet Take 20 mg by mouth at bedtime.     ? busPIRone (BUSPAR) 15 MG tablet Take 15 mg by mouth 2 (two) times daily.    ? Calcium Carb-Cholecalciferol 600-10 MG-MCG TABS Take 1 tablet by mouth 2 (two) times daily.    ? calcium carbonate (OSCAL) 1500 (600 Ca) MG TABS tablet Take by mouth.    ? Calcium Carbonate-Vitamin D 600-400 MG-UNIT tablet Take 1 tablet by mouth 2 (two) times daily.    ? carbidopa-levodopa (SINEMET IR) 25-100 MG tablet Take 1 tablet by mouth 3 (three) times daily. Take 1 tablet at 8am/1pm/5pm    ? cyclobenzaprine (FLEXERIL) 10 MG tablet Take 10 mg by mouth 3 (three) times daily as needed.    ? diphenhydrAMINE (BENADRYL) 25 MG tablet Take 1 tablet (25 mg total) by mouth every 8 (eight) hours as needed. 30 tablet 0  ? DULoxetine (CYMBALTA) 60 MG capsule Take 60 mg by mouth daily. For mood and pain    ? gabapentin (NEURONTIN) 300 MG capsule Take by mouth.    ? JARDIANCE 10 MG TABS tablet Take 10 mg by mouth daily.    ? memantine (NAMENDA) 10 MG tablet Take 20 mg by mouth at bedtime.    ? memantine (NAMENDA) 10 MG tablet TAKE ONE AND ONE-HALF TABLETS BY MOUTH AT BEDTIME (INCREASED DOSE)    ? methocarbamol (ROBAXIN) 500 MG tablet Take 1,000 mg by mouth 2 (two) times daily.    ? metoCLOPramide (REGLAN) 10 MG tablet Take 1 tablet (10 mg total) by mouth in the morning and at bedtime. 60 tablet 3  ? mirtazapine (REMERON) 30 MG tablet Take 30 mg by mouth at bedtime.    ? morphine (MS  CONTIN) 15 MG 12 hr tablet Take 1 tablet by mouth in the morning and at bedtime.    ? Multiple Vitamin (MULTIVITAMIN WITH MINERALS) TABS tablet Take 1 tablet by mouth daily.    ? mupirocin ointment (BACTROBAN) 2 % SMARTSIG:Sparingly Topical 2-3 Times Daily PRN    ? omeprazole (PRILOSEC) 40 MG capsule Take 40 mg by mouth 2 (two) times daily before a meal.    ? Oxycodone HCl 20 MG TABS Take 1 tablet by mouth 5 (five) times daily.    ? oxyCODONE-acetaminophen (PERCOCET) 10-325 MG tablet Take 1 tablet by mouth 2 (two) times daily.    ? polyethylene glycol (MIRALAX) 17 g packet 17 grams in 8 oz of water daily    ? pregabalin (LYRICA) 300 MG capsule Take 300 mg by mouth at bedtime.    ? primidone (MYSOLINE) 50 MG tablet Take 50 mg by mouth 3 (three) times daily. For tremors    ? propranolol ER (INDERAL LA) 60  MG 24 hr capsule Take 60 mg by mouth daily.    ? rasagiline (AZILECT) 0.5 MG TABS tablet TAKE TWO TABLETS BY MOUTH ONCE A DAY TO SLOW PROGRESSION OF PARKINSON'S DISEASE    ? RELISTOR 150 MG TABS Take 3 tablets by mouth daily.    ? tamsulosin (FLOMAX) 0.4 MG CAPS capsule Take 0.4 mg by mouth at bedtime.     ? Tapentadol HCl 100 MG TABS Take 150 mg by mouth 4 (four) times daily as needed (PAIN).     ? Tiotropium Bromide Monohydrate 2.5 MCG/ACT AERS Inhale 2 puffs into the lungs daily.    ? trifluoperazine (STELAZINE) 1 MG tablet Take 3 mg by mouth at bedtime.    ? risperiDONE (RISPERDAL) 0.5 MG tablet Take 1 tablet (0.5 mg total) by mouth at bedtime as needed for up to 30 days (for agitation). 30 tablet 0  ? ?No facility-administered medications prior to visit.  ? ? ? ?Review of Systems:  ? ?Constitutional:   No  weight loss, night sweats,  Fevers, chills,  ?+fatigue, or  lassitude. ? ?HEENT:   No headaches,  Difficulty swallowing,  Tooth/dental problems, or  Sore throat,  ?              No sneezing, itching, ear ache, nasal congestion, post nasal drip,  ? ?CV:  No chest pain,  Orthopnea, PND, swelling in lower  extremities, anasarca, dizziness, palpitations, syncope.  ? ?GI  No heartburn, indigestion, abdominal pain, nausea, vomiting, diarrhea, change in bowel habits, loss of appetite, bloody stools.  ? ?Resp:   No chest wall deform

## 2022-03-18 NOTE — Patient Instructions (Signed)
CT chest without contrast in 6 months to follow lung nodules  ? ?Continue on Spiriva daily  ?Add Flutter valve Twice daily  As needed  cough/congestion  ?Mucinex DM Twice daily  As needed  cough/congestion  ?Aspiration precautions -stay upright 1-2 hrs after eating , no eating 2-3hr prior to bedtime .  ? ?Follow up with Dr. Tonia Brooms in 6 months and As needed   ? ?

## 2022-03-27 DIAGNOSIS — R918 Other nonspecific abnormal finding of lung field: Secondary | ICD-10-CM | POA: Diagnosis not present

## 2022-03-27 DIAGNOSIS — J439 Emphysema, unspecified: Secondary | ICD-10-CM | POA: Diagnosis not present

## 2022-03-27 DIAGNOSIS — J479 Bronchiectasis, uncomplicated: Secondary | ICD-10-CM | POA: Diagnosis not present

## 2022-03-31 DIAGNOSIS — E1165 Type 2 diabetes mellitus with hyperglycemia: Secondary | ICD-10-CM | POA: Diagnosis not present

## 2022-04-02 DIAGNOSIS — G894 Chronic pain syndrome: Secondary | ICD-10-CM | POA: Diagnosis not present

## 2022-04-02 DIAGNOSIS — K5903 Drug induced constipation: Secondary | ICD-10-CM | POA: Diagnosis not present

## 2022-04-02 DIAGNOSIS — M47817 Spondylosis without myelopathy or radiculopathy, lumbosacral region: Secondary | ICD-10-CM | POA: Diagnosis not present

## 2022-04-02 DIAGNOSIS — M25511 Pain in right shoulder: Secondary | ICD-10-CM | POA: Diagnosis not present

## 2022-04-02 DIAGNOSIS — Z9889 Other specified postprocedural states: Secondary | ICD-10-CM | POA: Diagnosis not present

## 2022-04-02 DIAGNOSIS — M47812 Spondylosis without myelopathy or radiculopathy, cervical region: Secondary | ICD-10-CM | POA: Diagnosis not present

## 2022-04-02 DIAGNOSIS — Z79891 Long term (current) use of opiate analgesic: Secondary | ICD-10-CM | POA: Diagnosis not present

## 2022-04-18 DIAGNOSIS — J479 Bronchiectasis, uncomplicated: Secondary | ICD-10-CM | POA: Diagnosis not present

## 2022-04-18 DIAGNOSIS — R911 Solitary pulmonary nodule: Secondary | ICD-10-CM | POA: Diagnosis not present

## 2022-04-18 DIAGNOSIS — E1165 Type 2 diabetes mellitus with hyperglycemia: Secondary | ICD-10-CM | POA: Diagnosis not present

## 2022-04-18 DIAGNOSIS — I8310 Varicose veins of unspecified lower extremity with inflammation: Secondary | ICD-10-CM | POA: Diagnosis not present

## 2022-04-30 DIAGNOSIS — M2550 Pain in unspecified joint: Secondary | ICD-10-CM | POA: Diagnosis not present

## 2022-04-30 DIAGNOSIS — M791 Myalgia, unspecified site: Secondary | ICD-10-CM | POA: Diagnosis not present

## 2022-04-30 DIAGNOSIS — M25512 Pain in left shoulder: Secondary | ICD-10-CM | POA: Diagnosis not present

## 2022-04-30 DIAGNOSIS — M47817 Spondylosis without myelopathy or radiculopathy, lumbosacral region: Secondary | ICD-10-CM | POA: Diagnosis not present

## 2022-04-30 DIAGNOSIS — Z79891 Long term (current) use of opiate analgesic: Secondary | ICD-10-CM | POA: Diagnosis not present

## 2022-04-30 DIAGNOSIS — G894 Chronic pain syndrome: Secondary | ICD-10-CM | POA: Diagnosis not present

## 2022-04-30 DIAGNOSIS — M25511 Pain in right shoulder: Secondary | ICD-10-CM | POA: Diagnosis not present

## 2022-04-30 DIAGNOSIS — M545 Low back pain, unspecified: Secondary | ICD-10-CM | POA: Diagnosis not present

## 2022-04-30 DIAGNOSIS — M47812 Spondylosis without myelopathy or radiculopathy, cervical region: Secondary | ICD-10-CM | POA: Diagnosis not present

## 2022-05-29 DIAGNOSIS — M791 Myalgia, unspecified site: Secondary | ICD-10-CM | POA: Diagnosis not present

## 2022-05-29 DIAGNOSIS — M47817 Spondylosis without myelopathy or radiculopathy, lumbosacral region: Secondary | ICD-10-CM | POA: Diagnosis not present

## 2022-05-29 DIAGNOSIS — M2550 Pain in unspecified joint: Secondary | ICD-10-CM | POA: Diagnosis not present

## 2022-05-29 DIAGNOSIS — Z79891 Long term (current) use of opiate analgesic: Secondary | ICD-10-CM | POA: Diagnosis not present

## 2022-05-29 DIAGNOSIS — K5903 Drug induced constipation: Secondary | ICD-10-CM | POA: Diagnosis not present

## 2022-05-29 DIAGNOSIS — M47812 Spondylosis without myelopathy or radiculopathy, cervical region: Secondary | ICD-10-CM | POA: Diagnosis not present

## 2022-05-29 DIAGNOSIS — G894 Chronic pain syndrome: Secondary | ICD-10-CM | POA: Diagnosis not present

## 2022-05-29 DIAGNOSIS — M545 Low back pain, unspecified: Secondary | ICD-10-CM | POA: Diagnosis not present

## 2022-06-19 ENCOUNTER — Other Ambulatory Visit: Payer: Self-pay | Admitting: Physician Assistant

## 2022-06-25 DIAGNOSIS — L03211 Cellulitis of face: Secondary | ICD-10-CM | POA: Diagnosis not present

## 2022-06-30 DIAGNOSIS — M1711 Unilateral primary osteoarthritis, right knee: Secondary | ICD-10-CM | POA: Diagnosis not present

## 2022-06-30 DIAGNOSIS — M5451 Vertebrogenic low back pain: Secondary | ICD-10-CM | POA: Diagnosis not present

## 2022-06-30 DIAGNOSIS — M1712 Unilateral primary osteoarthritis, left knee: Secondary | ICD-10-CM | POA: Diagnosis not present

## 2022-06-30 DIAGNOSIS — M25562 Pain in left knee: Secondary | ICD-10-CM | POA: Diagnosis not present

## 2022-06-30 DIAGNOSIS — M545 Low back pain, unspecified: Secondary | ICD-10-CM | POA: Diagnosis not present

## 2022-06-30 DIAGNOSIS — M25561 Pain in right knee: Secondary | ICD-10-CM | POA: Diagnosis not present

## 2022-07-01 DIAGNOSIS — E559 Vitamin D deficiency, unspecified: Secondary | ICD-10-CM | POA: Diagnosis not present

## 2022-07-01 DIAGNOSIS — R5383 Other fatigue: Secondary | ICD-10-CM | POA: Diagnosis not present

## 2022-07-01 DIAGNOSIS — Z79899 Other long term (current) drug therapy: Secondary | ICD-10-CM | POA: Diagnosis not present

## 2022-07-01 DIAGNOSIS — Z0000 Encounter for general adult medical examination without abnormal findings: Secondary | ICD-10-CM | POA: Diagnosis not present

## 2022-07-01 DIAGNOSIS — Z131 Encounter for screening for diabetes mellitus: Secondary | ICD-10-CM | POA: Diagnosis not present

## 2022-07-01 DIAGNOSIS — Z1159 Encounter for screening for other viral diseases: Secondary | ICD-10-CM | POA: Diagnosis not present

## 2022-07-01 DIAGNOSIS — R000 Tachycardia, unspecified: Secondary | ICD-10-CM | POA: Diagnosis not present

## 2022-07-01 DIAGNOSIS — F1190 Opioid use, unspecified, uncomplicated: Secondary | ICD-10-CM | POA: Diagnosis not present

## 2022-07-01 DIAGNOSIS — Z114 Encounter for screening for human immunodeficiency virus [HIV]: Secondary | ICD-10-CM | POA: Diagnosis not present

## 2022-07-01 DIAGNOSIS — Z6830 Body mass index (BMI) 30.0-30.9, adult: Secondary | ICD-10-CM | POA: Diagnosis not present

## 2022-07-01 DIAGNOSIS — R7309 Other abnormal glucose: Secondary | ICD-10-CM | POA: Diagnosis not present

## 2022-07-01 DIAGNOSIS — R030 Elevated blood-pressure reading, without diagnosis of hypertension: Secondary | ICD-10-CM | POA: Diagnosis not present

## 2022-07-03 ENCOUNTER — Other Ambulatory Visit: Payer: Self-pay

## 2022-07-03 ENCOUNTER — Emergency Department (HOSPITAL_BASED_OUTPATIENT_CLINIC_OR_DEPARTMENT_OTHER)
Admission: EM | Admit: 2022-07-03 | Discharge: 2022-07-03 | Disposition: A | Discharge status: Home / Self Care | Payer: No Typology Code available for payment source | Attending: Emergency Medicine | Admitting: Emergency Medicine

## 2022-07-03 ENCOUNTER — Encounter (HOSPITAL_BASED_OUTPATIENT_CLINIC_OR_DEPARTMENT_OTHER): Payer: Self-pay | Admitting: Emergency Medicine

## 2022-07-03 DIAGNOSIS — F1110 Opioid abuse, uncomplicated: Secondary | ICD-10-CM

## 2022-07-03 DIAGNOSIS — Z7982 Long term (current) use of aspirin: Secondary | ICD-10-CM | POA: Diagnosis not present

## 2022-07-03 DIAGNOSIS — F1120 Opioid dependence, uncomplicated: Secondary | ICD-10-CM | POA: Insufficient documentation

## 2022-07-03 DIAGNOSIS — G4733 Obstructive sleep apnea (adult) (pediatric): Secondary | ICD-10-CM | POA: Insufficient documentation

## 2022-07-03 DIAGNOSIS — G20 Parkinson's disease: Secondary | ICD-10-CM | POA: Insufficient documentation

## 2022-07-03 DIAGNOSIS — M791 Myalgia, unspecified site: Secondary | ICD-10-CM | POA: Diagnosis present

## 2022-07-03 LAB — CBG MONITORING, ED: Glucose-Capillary: 132 mg/dL — ABNORMAL HIGH (ref 70–99)

## 2022-07-03 MED ORDER — OXYCODONE-ACETAMINOPHEN 5-325 MG PO TABS
2.0000 | ORAL_TABLET | Freq: Once | ORAL | Status: AC
  Administered 2022-07-03: 2 via ORAL
  Filled 2022-07-03: qty 2

## 2022-07-03 MED ORDER — IBUPROFEN 800 MG PO TABS: 800.0000 mg | ORAL_TABLET | Freq: Three times a day (TID) | ORAL | 0 refills | Status: AC

## 2022-07-03 MED ORDER — OXYCODONE-ACETAMINOPHEN 5-325 MG PO TABS: 2.0000 | ORAL_TABLET | Freq: Three times a day (TID) | ORAL | 0 refills | Status: AC | PRN

## 2022-07-03 MED ORDER — ONDANSETRON 4 MG PO TBDP
4.0000 mg | ORAL_TABLET | Freq: Once | ORAL | Status: AC
  Administered 2022-07-03: 4 mg via ORAL
  Filled 2022-07-03: qty 1

## 2022-07-03 NOTE — ED Notes (Signed)
Dc information reviewed with pt, pt verbalizes understanding

## 2022-07-03 NOTE — ED Triage Notes (Signed)
Pt arrives to ED via [facility] with c/o acute on chronic pain syndrome. Pt reports he has recently ran out of his pain medications (Morphine, Oxycodone) x2 days ago and has been  experiencing extreme generalized pain. Pain worse in bilateral shoulders, feet, and neck. Pt with hx of Parkinson's Disease.

## 2022-07-03 NOTE — ED Notes (Signed)
Pt placed on Oxygen @2L /min when sleeping pt's sats drop to upper 80's  Pt c/o pain all over

## 2022-07-03 NOTE — ED Notes (Signed)
[facility] called for transportation  

## 2022-07-03 NOTE — ED Provider Notes (Signed)
[facility] EMERGENCY DEPT Provider Note   CSN: [id number] Arrival date & time: 07/03/22  1038     History  Chief Complaint  Patient presents with   Generalzied Pain    Alan Brown is a 71 y.o. male.  Patient is a 71 year old male with past medical history of CVA, Parkinson's disease, bipolar disorder presenting for complaints of pain "all over".  Patient states he ran out of his home pain medications 2 days ago has been having severe allover body pain.  Admits to pain in the right shoulder, the lower back, the left abdomen, and the bilateral feet.  Chart review demonstrates patient picked up prescription for morphine extended release 15 mg twice a day with 30-day supply (#60 tablets) 14 days ago on 06/19/2022.  Patient also has a prescription for Percocet 10 325 3 times a day with a 30-day supply (#105 pills) that was picked up 25 days ago on 06/08/22.  It appears that patient is using over the prescribed amount of pain medications.  Patient states " I might be taking 1 or 2 more a day due to severe pain".  Patient has scheduled appointment this Sunday with primary care physician regarding needed refills.  Patient presented to ED with emesis bag although he denies vomiting or nausea.  The history is provided by the patient. No language interpreter was used.       Home Medications Prior to Admission medications   Medication Sig Start Date End Date Taking? Authorizing Provider  ibuprofen (ADVIL) 800 MG tablet Take 1 tablet (800 mg total) by mouth 3 (three) times daily. 07/03/22  Yes Edwin Dada P, DO  oxyCODONE-acetaminophen (PERCOCET/ROXICET) 5-325 MG tablet Take 2 tablets by mouth every 8 (eight) hours as needed for up to 3 days for severe pain. 07/03/22 07/06/22 Yes Edwin Dada P, DO  acetaminophen (TYLENOL) 325 MG tablet TAKE TWO TABLETS BY MOUTH EVERY 6 HOURS FOR PAIN, ACHE, FEVER MAXIMUM 8 PILLS A DAY. 12/12/21   [provider]  albuterol (VENTOLIN  HFA) 108 (90 Base) MCG/ACT inhaler Inhale 2 puffs into the lungs 4 (four) times daily as needed for wheezing or shortness of breath.     [provider]  ARIPiprazole (ABILIFY) 5 MG tablet Take 5 mg by mouth at bedtime. 09/27/21   [provider]  aspirin EC 81 MG tablet Take 81 mg by mouth daily.    [provider]  atorvastatin (LIPITOR) 20 MG tablet Take 20 mg by mouth at bedtime.     [provider]  busPIRone (BUSPAR) 15 MG tablet Take 15 mg by mouth 2 (two) times daily.    [provider]  Calcium Carb-Cholecalciferol 600-10 MG-MCG TABS Take 1 tablet by mouth 2 (two) times daily. 07/26/21   [provider]  calcium carbonate (OSCAL) 1500 (600 Ca) MG TABS tablet Take by mouth.    [provider]  Calcium Carbonate-Vitamin D 600-400 MG-UNIT tablet Take 1 tablet by mouth 2 (two) times daily.    [provider]  carbidopa-levodopa (SINEMET IR) 25-100 MG tablet Take 1 tablet by mouth 3 (three) times daily. Take 1 tablet at 8am/1pm/5pm    [provider]  cyclobenzaprine (FLEXERIL) 10 MG tablet Take 10 mg by mouth 3 (three) times daily as needed. 09/29/21   [provider]  diphenhydrAMINE (BENADRYL) 25 MG tablet Take 1 tablet (25 mg total) by mouth every 8 (eight) hours as needed. 01/04/22   Horton, Clabe Seal, DO  DULoxetine (CYMBALTA) 60  MG capsule Take 60 mg by mouth daily. For mood and pain    [provider]  gabapentin (NEURONTIN) 300 MG capsule Take by mouth. 02/03/22   [provider]  JARDIANCE 10 MG TABS tablet Take 10 mg by mouth daily. 01/02/22   [provider]  memantine (NAMENDA) 10 MG tablet Take 20 mg by mouth at bedtime.    [provider]  memantine (NAMENDA) 10 MG tablet TAKE ONE AND ONE-HALF TABLETS BY MOUTH AT BEDTIME (INCREASED DOSE) 07/05/21   [provider]  methocarbamol (ROBAXIN) 500 MG tablet Take 1,000 mg by mouth 2 (two) times daily.     [provider]  metoCLOPramide (REGLAN) 10 MG tablet TAKE 1 TABLET BY MOUTH IN THE MORNING AND AT BEDTIME 06/20/22   Esterwood, Amy S, PA-C  mirtazapine (REMERON) 30 MG tablet Take 30 mg by mouth at bedtime. 10/29/21   [provider]  morphine (MS CONTIN) 15 MG 12 hr tablet Take 1 tablet by mouth in the morning and at bedtime. 08/09/20   [provider]  Multiple Vitamin (MULTIVITAMIN WITH MINERALS) TABS tablet Take 1 tablet by mouth daily.    [provider]  mupirocin ointment (BACTROBAN) 2 % SMARTSIG:Sparingly Topical 2-3 Times Daily PRN 10/17/21   [provider]  omeprazole (PRILOSEC) 40 MG capsule Take 40 mg by mouth 2 (two) times daily before a meal.    [provider]  Oxycodone HCl 20 MG TABS Take 1 tablet by mouth 5 (five) times daily. 08/09/20   [provider]  polyethylene glycol (MIRALAX) 17 g packet 17 grams in 8 oz of water daily 02/03/22   Lynann Bologna, MD  pregabalin (LYRICA) 300 MG capsule Take 300 mg by mouth at bedtime.    [provider]  primidone (MYSOLINE) 50 MG tablet Take 50 mg by mouth 3 (three) times daily. For tremors    [provider]  propranolol ER (INDERAL LA) 60 MG 24 hr capsule Take 60 mg by mouth daily.    [provider]  rasagiline (AZILECT) 0.5 MG TABS tablet TAKE TWO TABLETS BY MOUTH ONCE A DAY TO SLOW PROGRESSION OF PARKINSON'S DISEASE 08/08/21   [provider]  RELISTOR 150 MG TABS Take 3 tablets by mouth daily. 12/30/21   [provider]  risperiDONE (RISPERDAL) 0.5 MG tablet Take 1 tablet (0.5 mg total) by mouth at bedtime as needed for up to 30 days (for agitation). 04/27/19 01/17/22  Amin, Loura Halt, MD  tamsulosin (FLOMAX) 0.4 MG CAPS capsule Take 0.4 mg by mouth at bedtime.     [provider]  Tapentadol HCl 100 MG TABS Take 150 mg by mouth 4 (four) times daily as needed (PAIN).     [provider]  Tiotropium Bromide Monohydrate  2.5 MCG/ACT AERS Inhale 2 puffs into the lungs daily.    [provider]  trifluoperazine (STELAZINE) 1 MG tablet Take 3 mg by mouth at bedtime.    [provider]      Allergies    Patient has no known allergies.    Review of Systems   Review of Systems  Constitutional:  Negative for chills and fever.  HENT:  Negative for ear pain and sore throat.   Eyes:  Negative for pain and visual disturbance.  Respiratory:  Negative for cough and shortness of breath.   Cardiovascular:  Negative for chest pain and palpitations.  Gastrointestinal:  Negative for abdominal pain and vomiting.  Genitourinary:  Negative  for dysuria and hematuria.  Musculoskeletal:  Negative for arthralgias and back pain.  Skin:  Negative for color change and rash.  Neurological:  Negative for seizures and syncope.  All other systems reviewed and are negative.   Physical Exam Updated Vital Signs BP (!) 143/81   Pulse 81   Temp 98.5 F (36.9 C)   Resp 16   Ht 5\' 10"  (1.778 m)   Wt 98.9 kg   SpO2 94%   BMI 31.28 kg/m  Physical Exam Vitals and nursing note reviewed.  Constitutional:      General: He is not in acute distress.    Appearance: He is well-developed.  HENT:     Head: Normocephalic and atraumatic.  Eyes:     Conjunctiva/sclera: Conjunctivae normal.  Cardiovascular:     Rate and Rhythm: Normal rate and regular rhythm.     Heart sounds: No murmur heard. Pulmonary:     Effort: Pulmonary effort is normal. No respiratory distress.     Breath sounds: Normal breath sounds.  Abdominal:     Palpations: Abdomen is soft.     Tenderness: There is no abdominal tenderness.  Musculoskeletal:        General: No swelling.     Cervical back: Neck supple.  Skin:    General: Skin is warm and dry.     Capillary Refill: Capillary refill takes less than 2 seconds.  Neurological:     Mental Status: He is alert.  Psychiatric:        Mood and Affect: Mood normal.     ED Results /  Procedures / Treatments   Labs (all labs ordered are listed, but only abnormal results are displayed) Labs Reviewed  CBG MONITORING, ED - Abnormal; Notable for the following components:      Result Value   Glucose-Capillary 132 (*)    All other components within normal limits    EKG None  Radiology No results found.  Procedures Procedures    Medications Ordered in ED Medications  oxyCODONE-acetaminophen (PERCOCET/ROXICET) 5-325 MG per tablet 2 tablet (has no administration in time range)  ondansetron (ZOFRAN-ODT) disintegrating tablet 4 mg (has no administration in time range)    ED Course/ Medical Decision Making/ A&P                           Medical Decision Making Risk Prescription drug management.   44:56 AM 71 year old male with past medical history of CVA, Parkinson's disease, bipolar disorder presenting for complaints of pain "all over".  Patient is alert and oriented x3, no acute distress, afebrile, stable vital signs.  Physical exam demonstrates generalized bony tenderness everywhere.  No specific deformities, ecchymosis, swelling, erythema, or wounds.  Please see my HPI for further details on patient's filled prescriptions.  I spoke with patient's wife regarding my concerns for medication overuse.  I will cover patient up until his appointment with his primary care physician to prevent withdrawal symptoms and bounce back to the emergency department.  Patient and wife notified that the emergency department is not the correct location for chronic pain management or narcotic prescription refills.  You are understandable and have an appointment this Sunday with the primary care physician.  Recommended to request pain specialist as well.  Patient in no distress and overall condition improved here in the ED. Detailed discussions were had with the patient regarding current findings, and need for close f/u with PCP or on call doctor. The patient  has been instructed to return  immediately if the symptoms worsen in any way for re-evaluation. Patient verbalized understanding and is in agreement with current care plan. All questions answered prior to discharge.  Patient had episode of hypoxia at 89% while sleeping.  When woken patient improved to 96% and is otherwise well-appearing.  No symptoms of chest pain or shortness of breath.  Likely secondary to OSA.  Recommended for close follow-up with pulmonologist.           Final Clinical Impression(s) / ED Diagnoses Final diagnoses:  OSA (obstructive sleep apnea)  Opioid Overuse (HCC)    Rx / DC Orders ED Discharge Orders          Ordered    ibuprofen (ADVIL) 800 MG tablet  3 times daily        07/03/22 1154    oxyCODONE-acetaminophen (PERCOCET/ROXICET) 5-325 MG tablet  Every 8 hours PRN        07/03/22 1154              Franne Forts, DO 07/03/22 1154

## 2022-07-03 NOTE — Discharge Instructions (Signed)
Your appointment to Sunday with primary care physician for further discussion of narcotic overuse and further recommendations for treatment of pain including other alternatives than narcotics, therapy, and/or specialist.

## 2022-07-03 NOTE — ED Notes (Signed)
[facility] CANCELLED 

## 2022-07-03 NOTE — ED Notes (Signed)
Pt states his insurance will not pay for transportation ([facility]) back to house, pt has a ride that will be here around 3pm

## 2022-07-06 DIAGNOSIS — G20 Parkinson's disease: Secondary | ICD-10-CM | POA: Diagnosis not present

## 2022-07-06 DIAGNOSIS — E7800 Pure hypercholesterolemia, unspecified: Secondary | ICD-10-CM | POA: Diagnosis not present

## 2022-07-06 DIAGNOSIS — E119 Type 2 diabetes mellitus without complications: Secondary | ICD-10-CM | POA: Diagnosis not present

## 2022-07-06 DIAGNOSIS — M545 Low back pain, unspecified: Secondary | ICD-10-CM | POA: Diagnosis not present

## 2022-07-06 DIAGNOSIS — Z0130 Encounter for examination of blood pressure without abnormal findings: Secondary | ICD-10-CM | POA: Diagnosis not present

## 2022-07-06 DIAGNOSIS — Z6830 Body mass index (BMI) 30.0-30.9, adult: Secondary | ICD-10-CM | POA: Diagnosis not present

## 2022-07-06 DIAGNOSIS — L98499 Non-pressure chronic ulcer of skin of other sites with unspecified severity: Secondary | ICD-10-CM | POA: Diagnosis not present

## 2022-07-06 DIAGNOSIS — F419 Anxiety disorder, unspecified: Secondary | ICD-10-CM | POA: Diagnosis not present

## 2022-07-06 DIAGNOSIS — F1120 Opioid dependence, uncomplicated: Secondary | ICD-10-CM | POA: Diagnosis not present

## 2022-07-06 DIAGNOSIS — Z79899 Other long term (current) drug therapy: Secondary | ICD-10-CM | POA: Diagnosis not present

## 2022-07-07 DIAGNOSIS — Z79899 Other long term (current) drug therapy: Secondary | ICD-10-CM | POA: Diagnosis not present

## 2022-07-09 DIAGNOSIS — F1120 Opioid dependence, uncomplicated: Secondary | ICD-10-CM | POA: Diagnosis not present

## 2022-07-09 DIAGNOSIS — Z6829 Body mass index (BMI) 29.0-29.9, adult: Secondary | ICD-10-CM | POA: Diagnosis not present

## 2022-07-09 DIAGNOSIS — G8929 Other chronic pain: Secondary | ICD-10-CM | POA: Diagnosis not present

## 2022-07-09 DIAGNOSIS — L98499 Non-pressure chronic ulcer of skin of other sites with unspecified severity: Secondary | ICD-10-CM | POA: Diagnosis not present

## 2022-07-09 DIAGNOSIS — R030 Elevated blood-pressure reading, without diagnosis of hypertension: Secondary | ICD-10-CM | POA: Diagnosis not present

## 2022-07-09 DIAGNOSIS — E7800 Pure hypercholesterolemia, unspecified: Secondary | ICD-10-CM | POA: Diagnosis not present

## 2022-07-09 DIAGNOSIS — F419 Anxiety disorder, unspecified: Secondary | ICD-10-CM | POA: Diagnosis not present

## 2022-07-09 DIAGNOSIS — M545 Low back pain, unspecified: Secondary | ICD-10-CM | POA: Diagnosis not present

## 2022-07-09 DIAGNOSIS — Z79899 Other long term (current) drug therapy: Secondary | ICD-10-CM | POA: Diagnosis not present

## 2022-07-09 DIAGNOSIS — E119 Type 2 diabetes mellitus without complications: Secondary | ICD-10-CM | POA: Diagnosis not present

## 2022-07-17 ENCOUNTER — Other Ambulatory Visit: Payer: Self-pay

## 2022-07-17 ENCOUNTER — Emergency Department (HOSPITAL_COMMUNITY)
Admission: EM | Admit: 2022-07-17 | Discharge: 2022-07-17 | Disposition: A | Discharge status: Home / Self Care | Payer: No Typology Code available for payment source | Attending: Emergency Medicine | Admitting: Emergency Medicine

## 2022-07-17 DIAGNOSIS — F1120 Opioid dependence, uncomplicated: Secondary | ICD-10-CM | POA: Diagnosis not present

## 2022-07-17 DIAGNOSIS — Z87891 Personal history of nicotine dependence: Secondary | ICD-10-CM | POA: Insufficient documentation

## 2022-07-17 DIAGNOSIS — M25512 Pain in left shoulder: Secondary | ICD-10-CM | POA: Insufficient documentation

## 2022-07-17 DIAGNOSIS — G20 Parkinson's disease: Secondary | ICD-10-CM | POA: Insufficient documentation

## 2022-07-17 DIAGNOSIS — L98499 Non-pressure chronic ulcer of skin of other sites with unspecified severity: Secondary | ICD-10-CM | POA: Diagnosis not present

## 2022-07-17 DIAGNOSIS — Z6829 Body mass index (BMI) 29.0-29.9, adult: Secondary | ICD-10-CM | POA: Diagnosis not present

## 2022-07-17 DIAGNOSIS — M791 Myalgia, unspecified site: Secondary | ICD-10-CM | POA: Diagnosis not present

## 2022-07-17 DIAGNOSIS — Z0130 Encounter for examination of blood pressure without abnormal findings: Secondary | ICD-10-CM | POA: Diagnosis not present

## 2022-07-17 DIAGNOSIS — G8929 Other chronic pain: Secondary | ICD-10-CM | POA: Diagnosis not present

## 2022-07-17 DIAGNOSIS — R000 Tachycardia, unspecified: Secondary | ICD-10-CM | POA: Diagnosis not present

## 2022-07-17 DIAGNOSIS — F419 Anxiety disorder, unspecified: Secondary | ICD-10-CM | POA: Diagnosis not present

## 2022-07-17 DIAGNOSIS — F319 Bipolar disorder, unspecified: Secondary | ICD-10-CM | POA: Diagnosis not present

## 2022-07-17 DIAGNOSIS — E119 Type 2 diabetes mellitus without complications: Secondary | ICD-10-CM | POA: Diagnosis not present

## 2022-07-17 MED ORDER — METHOCARBAMOL 500 MG PO TABS: 500.0000 mg | ORAL_TABLET | Freq: Three times a day (TID) | ORAL | 0 refills | Status: AC | PRN

## 2022-07-17 MED ORDER — KETOROLAC TROMETHAMINE 15 MG/ML IJ SOLN
15.0000 mg | Freq: Once | INTRAMUSCULAR | Status: AC
Start: 2022-07-17 — End: 2022-07-17
  Administered 2022-07-17: 15 mg via INTRAMUSCULAR
  Filled 2022-07-17: qty 1

## 2022-07-17 MED ORDER — LIDOCAINE 5 % EX PTCH: 1.0000 | MEDICATED_PATCH | CUTANEOUS | 0 refills | Status: AC

## 2022-07-17 MED ORDER — HYDROMORPHONE HCL 2 MG/ML IJ SOLN
2.0000 mg | Freq: Once | INTRAMUSCULAR | Status: AC
  Administered 2022-07-17: 2 mg via INTRAMUSCULAR
  Filled 2022-07-17: qty 1

## 2022-07-17 MED ORDER — HYDROMORPHONE HCL 1 MG/ML IJ SOLN: 1.0000 mg | Freq: Once | INTRAMUSCULAR | Status: DC

## 2022-07-17 NOTE — Discharge Instructions (Signed)
You were evaluated in the Emergency Department and after careful evaluation, we did not find any emergent condition requiring admission or further testing in the hospital.  Your exam/testing today is overall reassuring.  Please return to the Emergency Department if you experience any worsening of your condition.   Thank you for allowing us to be a part of your care. 

## 2022-07-17 NOTE — ED Provider Notes (Signed)
[facility]-EMERGENCY DEPT [facility] Emergency Department Provider Note MRN:  [id number]  Arrival date & time: 07/17/22     Chief Complaint   Generalized Body Aches   History of Present Illness   Alan Brown is a 71 y.o. year-old male with a history of bipolar disorder, chronic pain, Parkinson's disease presenting to the ED with chief complaint of body aches.  Patient endorsing pain to the entire body.  Worse in the left shoulder.  Has chronic shoulder pain on the left, has been told he needs surgery.  Pain worse over the past several days.  Denies trauma, no numbness or weakness to the arms or legs, no fever, no other complaints.  Review of Systems  A thorough review of systems was obtained and all systems are negative except as noted in the HPI and PMH.   Patient's Health History    Past Medical History:  Diagnosis Date   Anxiety    Arthritis    "all through my body" (01/26/2014)   Bipolar 1 disorder (HCC)    Chronic lower back pain    Depression    Depression    GERD (gastroesophageal reflux disease)    Parkinson's disease (HCC)    Pneumonia 1952; 11/2013   Prolapse, disk    lower back   Stroke [facility])    pt reported TIA    Past Surgical History:  Procedure Laterality Date   APPENDECTOMY  12/16/1959   COLONOSCOPY     INGUINAL HERNIA REPAIR Left 12/15/1996   SHOULDER ARTHROSCOPY WITH ROTATOR CUFF REPAIR AND SUBACROMIAL DECOMPRESSION Right 06/29/2019   Procedure: RIGHT SHOULDER ARTHROSCOPY WITH ROTATOR CUFF REPAIR, BICEPS TENO SUBACROMIAL DECOMPRESSION, DISTAL CLAVICLE EXCISION, EXTENSIVE DEBRIDEMENT;  Surgeon: Tarry Kos, MD;  Location: [facility];  Service: Orthopedics;  Laterality: Right;   TONSILLECTOMY  12/15/1974    Family History  Problem Relation Age of Onset   Hypertension Mother    Hypertension Father    Aneurysm Father    Diabetes Sister    Other Sister        killed   Heart disease Sister    Colon cancer Neg Hx     Esophageal cancer Neg Hx    Pancreatic cancer Neg Hx    Stomach cancer Neg Hx     Social History   Socioeconomic History   Marital status: Married    Spouse name: Not on file   Number of children: Not on file   Years of education: Not on file   Highest education level: Not on file  Occupational History   Occupation: disabled veteran  Tobacco Use   Smoking status: Former    Packs/day: 1.00    Years: 25.00    Total pack years: 25.00    Types: Cigarettes   Smokeless tobacco: Never   Tobacco comments:    01/26/2014 "quit smoking 33yrs ago"  Vaping Use   Vaping Use: Never used  Substance and Sexual Activity   Alcohol use: No    Comment:  "quit drinking 25 yr ago; I'm a recovering alcoholic"   Drug use: No   Sexual activity: Not Currently  Other Topics Concern   Not on file  Social History Narrative   Not on file   Social Determinants of Health   Financial Resource Strain: Not on file  Food Insecurity: Not on file  Transportation Needs: Not on file  Physical Activity: Not on file  Stress: Not on file  Social Connections: Not on file  Intimate Partner  Violence: Not on file     Physical Exam   Vitals:   07/17/22 0509 07/17/22 0530  BP: (!) 139/91 (!) 141/87  Pulse: 87 84  Resp: 20   Temp: 97.6 F (36.4 C)   SpO2: 96% 96%    CONSTITUTIONAL: Well-appearing, NAD NEURO/PSYCH:  Alert and oriented x 3, no focal deficits EYES:  eyes equal and reactive ENT/NECK:  no LAD, no JVD CARDIO: Regular rate, well-perfused, normal S1 and S2 PULM:  CTAB no wheezing or rhonchi GI/GU:  non-distended, non-tender MSK/SPINE:  No gross deformities, no edema SKIN:  no rash, atraumatic   *Additional and/or pertinent findings included in MDM below  Diagnostic and Interventional Summary    EKG Interpretation  Date/Time:    Ventricular Rate:    PR Interval:    QRS Duration:   QT Interval:    QTC Calculation:   R Axis:     Text Interpretation:         Labs Reviewed - No  data to display  No orders to display    Medications  ketorolac (TORADOL) 15 MG/ML injection 15 mg (15 mg Intramuscular Given 07/17/22 0530)  HYDROmorphone (DILAUDID) injection 2 mg (2 mg Intramuscular Given 07/17/22 0530)     Procedures  /  Critical Care Procedures  ED Course and Medical Decision Making  Initial Impression and Ddx Acute on chronic shoulder pain, suspect flare of chronic pain.  No erythema, no increased warmth, no fever, highly doubt septic joint.  No recent trauma, no indication for imaging.  There is pain with movement, so MSK pain is favored.  Highly doubt cardiogenic cause.  Providing pain control and will reassess  Past medical/surgical history that increases complexity of ED encounter: History of chronic pain  Interpretation of Diagnostics Laboratory and/or imaging options to aid in the diagnosis/care of the patient were considered.  After careful history and physical examination, it was determined that there was no indication for diagnostics at this time.  Patient Reassessment and Ultimate Disposition/Management     Patient feeling much better and requesting discharge, no signs of emergent process, provided with return precautions.  Patient management required discussion with the following services or consulting groups:  None  Complexity of Problems Addressed Acute illness or injury that poses threat of life of bodily function  Additional Data Reviewed and Analyzed Further history obtained from: None  Additional Factors Impacting ED Encounter Risk None  Elmer Sow. Pilar Plate, MD [facility] Emergency Medicine [facility] [email or web address]  Final Clinical Impressions(s) / ED Diagnoses     ICD-10-CM   1. Other chronic pain  G89.29       ED Discharge Orders          Ordered    lidocaine (LIDODERM) 5 %  Every 24 hours        07/17/22 0633    methocarbamol (ROBAXIN) 500 MG tablet  Every 8 hours PRN        07/17/22 7846              Discharge Instructions Discussed with and Provided to Patient:    Discharge Instructions      You were evaluated in the Emergency Department and after careful evaluation, we did not find any emergent condition requiring admission or further testing in the hospital.  Your exam/testing today is overall reassuring.  Please return to the Emergency Department if you experience any worsening of your condition.   Thank you for allowing [place] to be a part of  your care.      Sabas Sous, MD 07/17/22 [phone]

## 2022-08-05 DIAGNOSIS — H04121 Dry eye syndrome of right lacrimal gland: Secondary | ICD-10-CM | POA: Diagnosis not present

## 2022-08-08 DIAGNOSIS — M545 Low back pain, unspecified: Secondary | ICD-10-CM | POA: Diagnosis not present

## 2022-08-08 DIAGNOSIS — G894 Chronic pain syndrome: Secondary | ICD-10-CM | POA: Diagnosis not present

## 2022-08-08 DIAGNOSIS — Z79891 Long term (current) use of opiate analgesic: Secondary | ICD-10-CM | POA: Diagnosis not present

## 2022-08-08 DIAGNOSIS — R202 Paresthesia of skin: Secondary | ICD-10-CM | POA: Diagnosis not present

## 2022-08-08 DIAGNOSIS — M25562 Pain in left knee: Secondary | ICD-10-CM | POA: Diagnosis not present

## 2022-08-08 DIAGNOSIS — M25561 Pain in right knee: Secondary | ICD-10-CM | POA: Diagnosis not present

## 2022-09-02 ENCOUNTER — Ambulatory Visit (HOSPITAL_COMMUNITY): Payer: Medicare PPO

## 2022-09-10 ENCOUNTER — Encounter (HOSPITAL_COMMUNITY): Payer: Self-pay

## 2022-09-10 ENCOUNTER — Ambulatory Visit (HOSPITAL_COMMUNITY)
Admission: RE | Admit: 2022-09-10 | Discharge: 2022-09-10 | Disposition: A | Discharge status: Home / Self Care | Payer: No Typology Code available for payment source | Source: Ambulatory Visit | Attending: Pulmonary Disease | Admitting: Pulmonary Disease

## 2022-09-10 DIAGNOSIS — R911 Solitary pulmonary nodule: Secondary | ICD-10-CM | POA: Diagnosis not present

## 2022-09-10 DIAGNOSIS — J439 Emphysema, unspecified: Secondary | ICD-10-CM | POA: Diagnosis not present

## 2022-09-12 DIAGNOSIS — M545 Low back pain, unspecified: Secondary | ICD-10-CM | POA: Diagnosis not present

## 2022-09-12 DIAGNOSIS — R202 Paresthesia of skin: Secondary | ICD-10-CM | POA: Diagnosis not present

## 2022-09-12 DIAGNOSIS — M25562 Pain in left knee: Secondary | ICD-10-CM | POA: Diagnosis not present

## 2022-09-12 DIAGNOSIS — Z79891 Long term (current) use of opiate analgesic: Secondary | ICD-10-CM | POA: Diagnosis not present

## 2022-09-12 DIAGNOSIS — G894 Chronic pain syndrome: Secondary | ICD-10-CM | POA: Diagnosis not present

## 2022-09-12 DIAGNOSIS — M25561 Pain in right knee: Secondary | ICD-10-CM | POA: Diagnosis not present

## 2022-09-18 ENCOUNTER — Ambulatory Visit (INDEPENDENT_AMBULATORY_CARE_PROVIDER_SITE_OTHER): Payer: No Typology Code available for payment source | Admitting: Pulmonary Disease

## 2022-09-18 ENCOUNTER — Encounter: Payer: Self-pay | Admitting: Pulmonary Disease

## 2022-09-18 VITALS — BP 140/64 | HR 90 | Temp 98.0°F | Ht 70.0 in | Wt 203.4 lb

## 2022-09-18 DIAGNOSIS — R918 Other nonspecific abnormal finding of lung field: Secondary | ICD-10-CM

## 2022-09-18 DIAGNOSIS — J439 Emphysema, unspecified: Secondary | ICD-10-CM

## 2022-09-18 DIAGNOSIS — J479 Bronchiectasis, uncomplicated: Secondary | ICD-10-CM | POA: Diagnosis not present

## 2022-09-18 DIAGNOSIS — R911 Solitary pulmonary nodule: Secondary | ICD-10-CM

## 2022-09-18 NOTE — Progress Notes (Signed)
Synopsis: Referred in February 2023 for abnormal CT chest by [facility], [facility]  Subjective:   PATIENT ID: Alan Brown: male DOB: [birth year], MRN: [id number]  Chief Complaint  Patient presents with   Follow-up    F/u after CT chest- sob same    This is a 71 year old gentleman, past medical history of depression, GERD, TIA.  Patient was referred for evaluation of abnormal CT imaging.  From what I can tell he had a full CT chest in April 2022 completed at the [facility] system which showed a right lower lobe groundglass opacity.  He is a former smoker quit 23 years ago.  He served in the [place] [employer] during [place].  He also had pneumonia this past year had a CT in in January with a follow-up image of the lower lobe abdominal CT which showed partial resolution of the inflammatory infiltrates in the right lower lobe.  I have not had a full CT chest in our system to review since 2020.  OV 09/18/2022: Here today for follow-up.  We reviewed his CT scan of the chest that was completed at the end of September.  It shows resolution of the right upper lobe posterior nodule that we were following.  That he has a new nodule in the left upper lobe.  He quit smoking 24 years ago.  Otherwise he has no respiratory complaints unfortunately he does feel like his Parkinson's is getting worse.  He is currently being managed at the [place] for this.  He is intermittently using his Spiriva.  He predominantly uses his albuterol as needed throughout the day.  We talked about his inhaler use today.    Past Medical History:  Diagnosis Date   Anxiety    Arthritis    "all through my body" (01/26/2014)   Bipolar 1 disorder (HCC)    Chronic lower back pain    Depression    Depression    GERD (gastroesophageal reflux disease)    Parkinson's disease    Pneumonia 1952; 11/2013   Prolapse, disk    lower back   Stroke [facility])    pt reported TIA     Family History  Problem Relation Age of Onset    Hypertension Mother    Hypertension Father    Aneurysm Father    Diabetes Sister    Other Sister        killed   Heart disease Sister    Colon cancer Neg Hx    Esophageal cancer Neg Hx    Pancreatic cancer Neg Hx    Stomach cancer Neg Hx      Past Surgical History:  Procedure Laterality Date   APPENDECTOMY  12/16/1959   COLONOSCOPY     INGUINAL HERNIA REPAIR Left 12/15/1996   SHOULDER ARTHROSCOPY WITH ROTATOR CUFF REPAIR AND SUBACROMIAL DECOMPRESSION Right 06/29/2019   Procedure: RIGHT SHOULDER ARTHROSCOPY WITH ROTATOR CUFF REPAIR, BICEPS TENO SUBACROMIAL DECOMPRESSION, DISTAL CLAVICLE EXCISION, EXTENSIVE DEBRIDEMENT;  Surgeon: Leandrew Koyanagi, MD;  Location: [place];  Service: Orthopedics;  Laterality: Right;   TONSILLECTOMY  12/15/1974    Social History   Socioeconomic History   Marital status: Married    Spouse name: Not on file   Number of children: Not on file   Years of education: Not on file   Highest education level: Not on file  Occupational History   Occupation: disabled veteran  Tobacco Use   Smoking status: Former    Packs/day: 1.00    Years:  25.00    Total pack years: 25.00    Types: Cigarettes   Smokeless tobacco: Never   Tobacco comments:    01/26/2014 "quit smoking 76yr ago"  Vaping Use   Vaping Use: Never used  Substance and Sexual Activity   Alcohol use: No    Comment:  "quit drinking 25 yr ago; I'm a recovering alcoholic"   Drug use: No   Sexual activity: Not Currently  Other Topics Concern   Not on file  Social History Narrative   Not on file   Social Determinants of Health   Financial Resource Strain: Not on file  Food Insecurity: Not on file  Transportation Needs: Not on file  Physical Activity: Not on file  Stress: Not on file  Social Connections: Not on file  Intimate Partner Violence: Not on file     No Known Allergies   Outpatient Medications Prior to Visit  Medication Sig Dispense Refill   acetaminophen  (TYLENOL) 325 MG tablet      albuterol (VENTOLIN HFA) 108 (90 Base) MCG/ACT inhaler Inhale 2 puffs into the lungs 4 (four) times daily as needed for wheezing or shortness of breath.      aspirin EC 81 MG tablet Take 81 mg by mouth daily.     atorvastatin (LIPITOR) 20 MG tablet Take 20 mg by mouth at bedtime.      busPIRone (BUSPAR) 15 MG tablet Take 15 mg by mouth 2 (two) times daily.     carbidopa-levodopa (SINEMET IR) 25-100 MG tablet Take 1 tablet by mouth 3 (three) times daily. Take 1 tablet at 8am/1pm/5pm     diphenhydrAMINE (BENADRYL) 25 MG tablet Take 1 tablet (25 mg total) by mouth every 8 (eight) hours as needed. 30 tablet 0   DULoxetine (CYMBALTA) 60 MG capsule Take 60 mg by mouth daily. For mood and pain     gabapentin (NEURONTIN) 300 MG capsule Take by mouth.     ibuprofen (ADVIL) 800 MG tablet Take 1 tablet (800 mg total) by mouth 3 (three) times daily. 21 tablet 0   memantine (NAMENDA) 10 MG tablet Take 20 mg by mouth at bedtime.     omeprazole (PRILOSEC) 40 MG capsule Take 40 mg by mouth 2 (two) times daily before a meal.     Oxycodone HCl 20 MG TABS Take 1 tablet by mouth 5 (five) times daily.     polyethylene glycol (MIRALAX) 17 g packet 17 grams in 8 oz of water daily     pregabalin (LYRICA) 300 MG capsule Take 300 mg by mouth at bedtime.     primidone (MYSOLINE) 50 MG tablet Take 50 mg by mouth 3 (three) times daily. For tremors     propranolol ER (INDERAL LA) 60 MG 24 hr capsule Take 60 mg by mouth daily.     rasagiline (AZILECT) 0.5 MG TABS tablet TAKE TWO TABLETS BY MOUTH ONCE A DAY TO SLOW PROGRESSION OF PARKINSON'S DISEASE     RELISTOR 150 MG TABS Take 3 tablets by mouth daily.     Tiotropium Bromide Monohydrate 2.5 MCG/ACT AERS Inhale 2 puffs into the lungs daily.     ARIPiprazole (ABILIFY) 5 MG tablet Take 5 mg by mouth at bedtime. (Patient not taking: Reported on 09/18/2022)     Calcium Carb-Cholecalciferol 600-10 MG-MCG TABS Take 1 tablet by mouth 2 (two) times daily.  (Patient not taking: Reported on 09/18/2022)     calcium carbonate (OSCAL) 1500 (600 Ca) MG TABS tablet Take by mouth. (Patient not  taking: Reported on 09/18/2022)     Calcium Carbonate-Vitamin D 600-400 MG-UNIT tablet Take 1 tablet by mouth 2 (two) times daily. (Patient not taking: Reported on 09/18/2022)     cyclobenzaprine (FLEXERIL) 10 MG tablet Take 10 mg by mouth 3 (three) times daily as needed. (Patient not taking: Reported on 09/18/2022)     JARDIANCE 10 MG TABS tablet Take 10 mg by mouth daily. (Patient not taking: Reported on 09/18/2022)     lidocaine (LIDODERM) 5 % Place 1 patch onto the skin daily. Remove & Discard patch within 12 hours or as directed by MD (Patient not taking: Reported on 09/18/2022) 5 patch 0   memantine (NAMENDA) 10 MG tablet TAKE ONE AND ONE-HALF TABLETS BY MOUTH AT BEDTIME (INCREASED DOSE) (Patient not taking: Reported on 09/18/2022)     methocarbamol (ROBAXIN) 500 MG tablet Take 1 tablet (500 mg total) by mouth every 8 (eight) hours as needed for muscle spasms. (Patient not taking: Reported on 09/18/2022) 30 tablet 0   metoCLOPramide (REGLAN) 10 MG tablet TAKE 1 TABLET BY MOUTH IN THE MORNING AND AT BEDTIME (Patient not taking: Reported on 09/18/2022) 60 tablet 3   mirtazapine (REMERON) 30 MG tablet Take 30 mg by mouth at bedtime. (Patient not taking: Reported on 09/18/2022)     morphine (MS CONTIN) 15 MG 12 hr tablet Take 1 tablet by mouth in the morning and at bedtime. (Patient not taking: Reported on 09/18/2022)     Multiple Vitamin (MULTIVITAMIN WITH MINERALS) TABS tablet Take 1 tablet by mouth daily. (Patient not taking: Reported on 09/18/2022)     mupirocin ointment (BACTROBAN) 2 % SMARTSIG:Sparingly Topical 2-3 Times Daily PRN (Patient not taking: Reported on 09/18/2022)     oxyCODONE-acetaminophen (PERCOCET/ROXICET) 5-325 MG tablet Take 1 tablet by mouth 2 (two) times daily.     risperiDONE (RISPERDAL) 0.5 MG tablet Take 1 tablet (0.5 mg total) by mouth at bedtime as  needed for up to 30 days (for agitation). 30 tablet 0   tamsulosin (FLOMAX) 0.4 MG CAPS capsule Take 0.4 mg by mouth at bedtime.  (Patient not taking: Reported on 09/18/2022)     Tapentadol HCl 100 MG TABS Take 150 mg by mouth 4 (four) times daily as needed (PAIN).  (Patient not taking: Reported on 09/18/2022)     trifluoperazine (STELAZINE) 1 MG tablet Take 3 mg by mouth at bedtime. (Patient not taking: Reported on 09/18/2022)     XTAMPZA ER 18 MG C12A Take 1 capsule by mouth 2 (two) times daily. (Patient not taking: Reported on 09/18/2022)     No facility-administered medications prior to visit.    Review of Systems  Constitutional:  Negative for chills, fever, malaise/fatigue and weight loss.  HENT:  Negative for hearing loss, sore throat and tinnitus.   Eyes:  Negative for blurred vision and double vision.  Respiratory:  Negative for cough, hemoptysis, sputum production, shortness of breath, wheezing and stridor.   Cardiovascular:  Negative for chest pain, palpitations, orthopnea, leg swelling and PND.  Gastrointestinal:  Negative for abdominal pain, constipation, diarrhea, heartburn, nausea and vomiting.  Genitourinary:  Negative for dysuria, hematuria and urgency.  Musculoskeletal:  Negative for joint pain and myalgias.  Skin:  Negative for itching and rash.  Neurological:  Positive for tremors. Negative for dizziness, tingling, weakness and headaches.  Endo/Heme/Allergies:  Negative for environmental allergies. Does not bruise/bleed easily.  Psychiatric/Behavioral:  Negative for depression. The patient is not nervous/anxious and does not have insomnia.   All other systems reviewed  and are negative.    Objective:  Physical Exam Vitals reviewed.  Constitutional:      General: He is not in acute distress.    Appearance: He is well-developed.  HENT:     Head: Normocephalic and atraumatic.  Eyes:     General: No scleral icterus.    Conjunctiva/sclera: Conjunctivae normal.      Pupils: Pupils are equal, round, and reactive to light.  Neck:     Vascular: No JVD.     Trachea: No tracheal deviation.  Cardiovascular:     Rate and Rhythm: Normal rate and regular rhythm.     Heart sounds: Normal heart sounds. No murmur heard. Pulmonary:     Effort: Pulmonary effort is normal. No tachypnea, accessory muscle usage or respiratory distress.     Breath sounds: No stridor. No wheezing, rhonchi or rales.  Abdominal:     General: There is no distension.     Palpations: Abdomen is soft.     Tenderness: There is no abdominal tenderness.  Musculoskeletal:        General: No tenderness.     Cervical back: Neck supple.  Lymphadenopathy:     Cervical: No cervical adenopathy.  Skin:    General: Skin is warm and dry.     Capillary Refill: Capillary refill takes less than 2 seconds.     Findings: No rash.  Neurological:     Mental Status: He is alert and oriented to person, place, and time.     Comments: Visible tremor, worse in right hand than left  Psychiatric:        Behavior: Behavior normal.      Vitals:   09/18/22 0925  BP: (!) 140/64  Pulse: 90  Temp: 98 F (36.7 C)  TempSrc: Temporal  SpO2: 94%  Weight: 203 lb 6.4 oz (92.3 kg)  Height: _0  (1.778 m)   94% on RA BMI Readings from Last 3 Encounters:  09/18/22 29.18 kg/m  07/17/22 29.41 kg/m  07/03/22 31.28 kg/m   Wt Readings from Last 3 Encounters:  09/18/22 203 lb 6.4 oz (92.3 kg)  07/17/22 205 lb (93 kg)  07/03/22 218 lb 0.6 oz (98.9 kg)     CBC    Component Value Date/Time   WBC 11.5 (H) 01/04/2022 1344   RBC 4.81 01/04/2022 1344   HGB 13.7 01/04/2022 1344   HCT 43.4 01/04/2022 1344   PLT 365 01/04/2022 1344   MCV 90.2 01/04/2022 1344   MCH 28.5 01/04/2022 1344   MCHC 31.6 01/04/2022 1344   RDW 16.5 (H) 01/04/2022 1344   LYMPHSABS 2.2 11/04/2021 2328   MONOABS 0.8 11/04/2021 2328   EOSABS 0.3 11/04/2021 2328   BASOSABS 0.1 11/04/2021 2328    Chest  Imaging:  03/25/2021: Impression: 1. Persistent groundglass opacification in the basilar RML is  nonspecific. New small patch of infectious or inflammatory  bronchiolitis in the basilar LLL. In addition to typical  infectious agents also consider atypical infectious agents such  as MAC/nontuberculous mycobacterium.  2. Mild pulmonary emphysema.   CT chest September 2023: Resolved groundglass nodule, subsolid nodule within the right upper lobe New nodule within the left upper lobe. The patient's images have been independently reviewed by me.    Pulmonary Functions Testing Results:     No data to display          FeNO:   Pathology:   Echocardiogram:   Heart Catheterization:     Assessment & Plan:  ICD-10-CM   1. Lung nodule  R91.1 CT Chest Wo Contrast    2. Ground glass opacity present on imaging of lung  R91.8     3. Bronchiectasis without complication ([place])  J00.9     4. Pulmonary emphysema, unspecified emphysema type ([place])  J43.9       Discussion:  This is a 71 year old gentleman, former smoker, [place] [employer] in [place] found to have a right lower lobe groundglass nodule and a right upper lobe subsolid nodule that have since resolved.  He had repeat CT imaging for follow-up which found a new nodule in his left side.  He quit smoking greater than 15 years ago, approximately 24 years ago.  Therefore does not qualify for lung cancer screening.  Plan: With his new nodule in his history of tobacco use I think is reasonable to at least follow him out again for 1 year. Repeat noncontrasted CT scan of the chest in 1 year. He is on a follow-up with [place] after that CT to ensure stability of his lung nodules. As for his likely underlying COPD encouraged him to continue to use his Spiriva.  He had not been using this regularly.  He has been using his as needed albuterol at least 2-3 times a day. He said he is can go back to using his Spiriva for maintenance dose.  Patient  was counseled on proper inhaler use.  RTC in 1 year after CT chest.    Current Outpatient Medications:    acetaminophen (TYLENOL) 325 MG tablet, , Disp: , Rfl:    albuterol (VENTOLIN HFA) 108 (90 Base) MCG/ACT inhaler, Inhale 2 puffs into the lungs 4 (four) times daily as needed for wheezing or shortness of breath. , Disp: , Rfl:    aspirin EC 81 MG tablet, Take 81 mg by mouth daily., Disp: , Rfl:    atorvastatin (LIPITOR) 20 MG tablet, Take 20 mg by mouth at bedtime. , Disp: , Rfl:    busPIRone (BUSPAR) 15 MG tablet, Take 15 mg by mouth 2 (two) times daily., Disp: , Rfl:    carbidopa-levodopa (SINEMET IR) 25-100 MG tablet, Take 1 tablet by mouth 3 (three) times daily. Take 1 tablet at 8am/1pm/5pm, Disp: , Rfl:    diphenhydrAMINE (BENADRYL) 25 MG tablet, Take 1 tablet (25 mg total) by mouth every 8 (eight) hours as needed., Disp: 30 tablet, Rfl: 0   DULoxetine (CYMBALTA) 60 MG capsule, Take 60 mg by mouth daily. For mood and pain, Disp: , Rfl:    gabapentin (NEURONTIN) 300 MG capsule, Take by mouth., Disp: , Rfl:    ibuprofen (ADVIL) 800 MG tablet, Take 1 tablet (800 mg total) by mouth 3 (three) times daily., Disp: 21 tablet, Rfl: 0   memantine (NAMENDA) 10 MG tablet, Take 20 mg by mouth at bedtime., Disp: , Rfl:    omeprazole (PRILOSEC) 40 MG capsule, Take 40 mg by mouth 2 (two) times daily before a meal., Disp: , Rfl:    Oxycodone HCl 20 MG TABS, Take 1 tablet by mouth 5 (five) times daily., Disp: , Rfl:    polyethylene glycol (MIRALAX) 17 g packet, 17 grams in 8 oz of water daily, Disp: , Rfl:    pregabalin (LYRICA) 300 MG capsule, Take 300 mg by mouth at bedtime., Disp: , Rfl:    primidone (MYSOLINE) 50 MG tablet, Take 50 mg by mouth 3 (three) times daily. For tremors, Disp: , Rfl:    propranolol ER (INDERAL LA) 60 MG 24 hr capsule,  Take 60 mg by mouth daily., Disp: , Rfl:    rasagiline (AZILECT) 0.5 MG TABS tablet, TAKE TWO TABLETS BY MOUTH ONCE A DAY TO SLOW PROGRESSION OF PARKINSON'S  DISEASE, Disp: , Rfl:    RELISTOR 150 MG TABS, Take 3 tablets by mouth daily., Disp: , Rfl:    Tiotropium Bromide Monohydrate 2.5 MCG/ACT AERS, Inhale 2 puffs into the lungs daily., Disp: , Rfl:    ARIPiprazole (ABILIFY) 5 MG tablet, Take 5 mg by mouth at bedtime. (Patient not taking: Reported on 09/18/2022), Disp: , Rfl:    Calcium Carb-Cholecalciferol 600-10 MG-MCG TABS, Take 1 tablet by mouth 2 (two) times daily. (Patient not taking: Reported on 09/18/2022), Disp: , Rfl:    calcium carbonate (OSCAL) 1500 (600 Ca) MG TABS tablet, Take by mouth. (Patient not taking: Reported on 09/18/2022), Disp: , Rfl:    Calcium Carbonate-Vitamin D 600-400 MG-UNIT tablet, Take 1 tablet by mouth 2 (two) times daily. (Patient not taking: Reported on 09/18/2022), Disp: , Rfl:    cyclobenzaprine (FLEXERIL) 10 MG tablet, Take 10 mg by mouth 3 (three) times daily as needed. (Patient not taking: Reported on 09/18/2022), Disp: , Rfl:    JARDIANCE 10 MG TABS tablet, Take 10 mg by mouth daily. (Patient not taking: Reported on 09/18/2022), Disp: , Rfl:    lidocaine (LIDODERM) 5 %, Place 1 patch onto the skin daily. Remove & Discard patch within 12 hours or as directed by MD (Patient not taking: Reported on 09/18/2022), Disp: 5 patch, Rfl: 0   memantine (NAMENDA) 10 MG tablet, TAKE ONE AND ONE-HALF TABLETS BY MOUTH AT BEDTIME (INCREASED DOSE) (Patient not taking: Reported on 09/18/2022), Disp: , Rfl:    methocarbamol (ROBAXIN) 500 MG tablet, Take 1 tablet (500 mg total) by mouth every 8 (eight) hours as needed for muscle spasms. (Patient not taking: Reported on 09/18/2022), Disp: 30 tablet, Rfl: 0   metoCLOPramide (REGLAN) 10 MG tablet, TAKE 1 TABLET BY MOUTH IN THE MORNING AND AT BEDTIME (Patient not taking: Reported on 09/18/2022), Disp: 60 tablet, Rfl: 3   mirtazapine (REMERON) 30 MG tablet, Take 30 mg by mouth at bedtime. (Patient not taking: Reported on 09/18/2022), Disp: , Rfl:    morphine (MS CONTIN) 15 MG 12 hr tablet, Take 1  tablet by mouth in the morning and at bedtime. (Patient not taking: Reported on 09/18/2022), Disp: , Rfl:    Multiple Vitamin (MULTIVITAMIN WITH MINERALS) TABS tablet, Take 1 tablet by mouth daily. (Patient not taking: Reported on 09/18/2022), Disp: , Rfl:    mupirocin ointment (BACTROBAN) 2 %, SMARTSIG:Sparingly Topical 2-3 Times Daily PRN (Patient not taking: Reported on 09/18/2022), Disp: , Rfl:    oxyCODONE-acetaminophen (PERCOCET/ROXICET) 5-325 MG tablet, Take 1 tablet by mouth 2 (two) times daily., Disp: , Rfl:    risperiDONE (RISPERDAL) 0.5 MG tablet, Take 1 tablet (0.5 mg total) by mouth at bedtime as needed for up to 30 days (for agitation)., Disp: 30 tablet, Rfl: 0   tamsulosin (FLOMAX) 0.4 MG CAPS capsule, Take 0.4 mg by mouth at bedtime.  (Patient not taking: Reported on 09/18/2022), Disp: , Rfl:    Tapentadol HCl 100 MG TABS, Take 150 mg by mouth 4 (four) times daily as needed (PAIN).  (Patient not taking: Reported on 09/18/2022), Disp: , Rfl:    trifluoperazine (STELAZINE) 1 MG tablet, Take 3 mg by mouth at bedtime. (Patient not taking: Reported on 09/18/2022), Disp: , Rfl:    XTAMPZA ER 18 MG C12A, Take 1 capsule by mouth 2 (  two) times daily. (Patient not taking: Reported on 09/18/2022), Disp: , Rfl:     Garner Nash, DO [facility] 09/18/2022 9:41 AM

## 2022-09-18 NOTE — Patient Instructions (Signed)
Thank you for visiting Dr. Valeta Harms at [facility]. Today we recommend the following:  Orders Placed This Encounter  Procedures   CT Chest Wo Contrast   Continue spiriva  Continue albuterol as needed  Follow up ct chest in 1 year   Return in about 1 year (around 09/19/2023) for with Eric Form, NP.    Please do your part to reduce the spread of COVID-19.

## 2022-10-25 ENCOUNTER — Other Ambulatory Visit: Payer: Self-pay

## 2022-10-25 ENCOUNTER — Encounter (HOSPITAL_BASED_OUTPATIENT_CLINIC_OR_DEPARTMENT_OTHER): Payer: Self-pay

## 2022-10-25 ENCOUNTER — Emergency Department (HOSPITAL_BASED_OUTPATIENT_CLINIC_OR_DEPARTMENT_OTHER)
Admission: EM | Admit: 2022-10-25 | Discharge: 2022-10-25 | Disposition: A | Discharge status: Home / Self Care | Payer: No Typology Code available for payment source | Attending: Emergency Medicine | Admitting: Emergency Medicine

## 2022-10-25 DIAGNOSIS — G894 Chronic pain syndrome: Secondary | ICD-10-CM | POA: Insufficient documentation

## 2022-10-25 DIAGNOSIS — G8929 Other chronic pain: Secondary | ICD-10-CM | POA: Diagnosis present

## 2022-10-25 MED ORDER — HYDROMORPHONE HCL 1 MG/ML IJ SOLN
2.0000 mg | Freq: Once | INTRAMUSCULAR | Status: AC
  Administered 2022-10-25: 2 mg via INTRAMUSCULAR
  Filled 2022-10-25: qty 2

## 2022-10-25 NOTE — ED Provider Notes (Signed)
[facility] EMERGENCY DEPT Provider Note   CSN: [id number] Arrival date & time: 10/25/22  1800     History  Chief Complaint  Patient presents with   Pain    Alan Brown is a 71 y.o. male.  Patient is 100% disabled of that.  Followed at the [facility].  But for pain management he is followed at the [employer] clinic.  States that the doctor from [facility] comes once a month.  He was prescribed at the end of the month Oxy codon and Xtampza but not having any pain relief with that.  Fortunately these were 30-day supplies.  Symptoms for chronic pain have been ongoing for several years.  There is nothing new or acute.       Home Medications Prior to Admission medications   Medication Sig Start Date End Date Taking? Authorizing Provider  acetaminophen (TYLENOL) 325 MG tablet  12/12/21   [provider]  albuterol (VENTOLIN HFA) 108 (90 Base) MCG/ACT inhaler Inhale 2 puffs into the lungs 4 (four) times daily as needed for wheezing or shortness of breath.     [provider]  ARIPiprazole (ABILIFY) 5 MG tablet Take 5 mg by mouth at bedtime. Patient not taking: Reported on 09/18/2022 09/27/21   [provider]  aspirin EC 81 MG tablet Take 81 mg by mouth daily.    [provider]  atorvastatin (LIPITOR) 20 MG tablet Take 20 mg by mouth at bedtime.     [provider]  busPIRone (BUSPAR) 15 MG tablet Take 15 mg by mouth 2 (two) times daily.    [provider]  Calcium Carb-Cholecalciferol 600-10 MG-MCG TABS Take 1 tablet by mouth 2 (two) times daily. Patient not taking: Reported on 09/18/2022 07/26/21   [provider]  calcium carbonate (OSCAL) 1500 (600 Ca) MG TABS tablet Take by mouth. Patient not taking: Reported on 09/18/2022    [provider]  Calcium Carbonate-Vitamin D 600-400 MG-UNIT tablet Take 1 tablet by mouth 2 (two) times daily. Patient not taking: Reported on 09/18/2022     [provider]  carbidopa-levodopa (SINEMET IR) 25-100 MG tablet Take 1 tablet by mouth 3 (three) times daily. Take 1 tablet at 8am/1pm/5pm    [provider]  cyclobenzaprine (FLEXERIL) 10 MG tablet Take 10 mg by mouth 3 (three) times daily as needed. Patient not taking: Reported on 09/18/2022 09/29/21   [provider]  diphenhydrAMINE (BENADRYL) 25 MG tablet Take 1 tablet (25 mg total) by mouth every 8 (eight) hours as needed. 01/04/22   Horton, Clabe Seal, DO  DULoxetine (CYMBALTA) 60 MG capsule Take 60 mg by mouth daily. For mood and pain    [provider]  gabapentin (NEURONTIN) 300 MG capsule Take by mouth. 02/03/22   [provider]  ibuprofen (ADVIL) 800 MG tablet Take 1 tablet (800 mg total) by mouth 3 (three) times daily. 07/03/22   Edwin Dada P, DO  JARDIANCE 10 MG TABS tablet Take 10 mg by mouth daily. Patient not taking: Reported on 09/18/2022 01/02/22   [provider]  lidocaine (LIDODERM) 5 % Place 1 patch onto the skin daily. Remove & Discard patch within 12 hours or as directed by MD Patient not taking: Reported on 09/18/2022 07/17/22   Sabas Sous, MD  memantine (NAMENDA) 10 MG tablet Take 20 mg by mouth at bedtime.    [provider]  memantine (NAMENDA) 10 MG tablet TAKE ONE AND ONE-HALF TABLETS BY MOUTH AT  BEDTIME (INCREASED DOSE) Patient not taking: Reported on 09/18/2022 07/05/21   [provider]  methocarbamol (ROBAXIN) 500 MG tablet Take 1 tablet (500 mg total) by mouth every 8 (eight) hours as needed for muscle spasms. Patient not taking: Reported on 09/18/2022 07/17/22   Sabas Sous, MD  metoCLOPramide (REGLAN) 10 MG tablet TAKE 1 TABLET BY MOUTH IN THE MORNING AND AT BEDTIME Patient not taking: Reported on 09/18/2022 06/20/22   Esterwood, Amy S, PA-C  mirtazapine (REMERON) 30 MG tablet Take 30 mg by mouth at bedtime. Patient not taking: Reported on 09/18/2022 10/29/21   [provider]   morphine (MS CONTIN) 15 MG 12 hr tablet Take 1 tablet by mouth in the morning and at bedtime. Patient not taking: Reported on 09/18/2022 08/09/20   [provider]  Multiple Vitamin (MULTIVITAMIN WITH MINERALS) TABS tablet Take 1 tablet by mouth daily. Patient not taking: Reported on 09/18/2022    [provider]  mupirocin ointment (BACTROBAN) 2 % SMARTSIG:Sparingly Topical 2-3 Times Daily PRN Patient not taking: Reported on 09/18/2022 10/17/21   [provider]  omeprazole (PRILOSEC) 40 MG capsule Take 40 mg by mouth 2 (two) times daily before a meal.    [provider]  Oxycodone HCl 20 MG TABS Take 1 tablet by mouth 5 (five) times daily. 08/09/20   [provider]  oxyCODONE-acetaminophen (PERCOCET/ROXICET) 5-325 MG tablet Take 1 tablet by mouth 2 (two) times daily. 07/09/22   [provider]  polyethylene glycol (MIRALAX) 17 g packet 17 grams in 8 oz of water daily 02/03/22   Lynann Bologna, MD  pregabalin (LYRICA) 300 MG capsule Take 300 mg by mouth at bedtime.    [provider]  primidone (MYSOLINE) 50 MG tablet Take 50 mg by mouth 3 (three) times daily. For tremors    [provider]  propranolol ER (INDERAL LA) 60 MG 24 hr capsule Take 60 mg by mouth daily.    [provider]  rasagiline (AZILECT) 0.5 MG TABS tablet TAKE TWO TABLETS BY MOUTH ONCE A DAY TO SLOW PROGRESSION OF PARKINSON'S DISEASE 08/08/21   [provider]  RELISTOR 150 MG TABS Take 3 tablets by mouth daily. 12/30/21   [provider]  risperiDONE (RISPERDAL) 0.5 MG tablet Take 1 tablet (0.5 mg total) by mouth at bedtime as needed for up to 30 days (for agitation). 04/27/19 01/17/22  Amin, Loura Halt, MD  tamsulosin (FLOMAX) 0.4 MG CAPS capsule Take 0.4 mg by mouth at bedtime.  Patient not taking: Reported on 09/18/2022    [provider]  Tapentadol HCl 100 MG TABS Take 150 mg by mouth 4 (four) times daily as needed (PAIN).   Patient not taking: Reported on 09/18/2022    [provider]  Tiotropium Bromide Monohydrate 2.5 MCG/ACT AERS Inhale 2 puffs into the lungs daily.    [provider]  trifluoperazine (STELAZINE) 1 MG tablet Take 3 mg by mouth at bedtime. Patient not taking: Reported on 09/18/2022    [provider]  XTAMPZA ER 18 MG C12A Take 1 capsule by mouth 2 (two) times daily. Patient not taking: Reported on 09/18/2022 07/09/22   [provider]      Allergies    Patient has no known allergies.    Review of Systems   Review of Systems  Constitutional:  Negative for chills and fever.  HENT:  Negative for rhinorrhea and sore throat.   Eyes:  Negative for visual disturbance.  Respiratory:  Negative for cough and shortness of breath.   Cardiovascular:  Negative for chest pain and leg swelling.  Gastrointestinal:  Negative for abdominal pain, diarrhea, nausea and vomiting.  Genitourinary:  Negative for dysuria.  Musculoskeletal:  Positive for arthralgias, back pain and neck pain.  Skin:  Negative for rash.  Neurological:  Negative for dizziness, light-headedness and headaches.  Hematological:  Does not bruise/bleed easily.  Psychiatric/Behavioral:  Negative for confusion.     Physical Exam Updated Vital Signs BP 123/78 (BP Location: Right Arm)   Pulse (!) 105   Temp 98.8 F (37.1 C) (Oral)   Resp (!) 22   Ht 1.778 m (5\' 10" )   Wt 89.4 kg   SpO2 95%   BMI 28.27 kg/m  Physical Exam Vitals and nursing note reviewed.  Constitutional:      General: He is not in acute distress.    Appearance: Normal appearance. He is well-developed.  HENT:     Head: Normocephalic and atraumatic.  Eyes:     Extraocular Movements: Extraocular movements intact.     Conjunctiva/sclera: Conjunctivae normal.     Pupils: Pupils are equal, round, and reactive to light.  Cardiovascular:     Rate and Rhythm: Normal rate and regular rhythm.     Heart sounds: No murmur  heard. Pulmonary:     Effort: Pulmonary effort is normal. No respiratory distress.     Breath sounds: Normal breath sounds.  Abdominal:     Palpations: Abdomen is soft.     Tenderness: There is no abdominal tenderness.  Musculoskeletal:        General: No swelling, tenderness, deformity or signs of injury.     Cervical back: Normal range of motion and neck supple.     Right lower leg: No edema.     Left lower leg: No edema.  Skin:    General: Skin is warm and dry.     Capillary Refill: Capillary refill takes less than 2 seconds.  Neurological:     General: No focal deficit present.     Mental Status: He is alert and oriented to person, place, and time.     Cranial Nerves: No cranial nerve deficit.     Sensory: No sensory deficit.     Motor: No weakness.  Psychiatric:        Mood and Affect: Mood normal.     ED Results / Procedures / Treatments   Labs (all labs ordered are listed, but only abnormal results are displayed) Labs Reviewed - No data to display  EKG None  Radiology No results found.  Procedures Procedures    Medications Ordered in ED Medications  HYDROmorphone (DILAUDID) injection 2 mg (has no administration in time range)    ED Course/ Medical Decision Making/ A&P                           Medical Decision Making Risk Prescription drug management.   Patient nontoxic no acute distress.  Patient is already on a 30-day supply of narcotics for chronic pain.  Will not be able to renew a new prescription.  Will give 2 mg of IM hydromorphone here.  Patient will follow back up with pain management.  Patient nontoxic no acute distress.  No acute complaint.  Everything is consistent with his chronic pain.  Final Clinical Impression(s) / ED Diagnoses Final diagnoses:  Chronic pain syndrome    Rx / DC Orders ED Discharge Orders  None         Vanetta MuldersZackowski, Alissah Redmon, MD 10/25/22 2047

## 2022-10-25 NOTE — Discharge Instructions (Signed)
Also follow-up with the [facility] pain clinic.  Take your pain medicine as directed.  A boost with the hydromorphone given here tonight.  But as we discussed we cannot fill additional pain medicines because you have already received a 30-day supply.

## 2022-10-25 NOTE — ED Triage Notes (Signed)
Pt arrives via EMS from home. Pt states he has been experiencing worsening chronic pain all over. Denies any injuries. Was recently prescribed oxycodone-acetaminophen 5-325 and xtampza with no relief.

## 2022-10-31 ENCOUNTER — Telehealth: Payer: Self-pay

## 2022-10-31 NOTE — Telephone Encounter (Signed)
        Patient  visited [facility] on 10/25/2022  for Chronic pain syndrome.   Telephone encounter attempt :  1st  A HIPAA compliant voice message was left requesting a return call.  Instructed patient to call back at [phone].   Bethann Qualley [facility]  [facility] Population Health Community Resource Care Guide   [email or web address]  ?? [phone]   Website: [email or web address]  [email or web address]

## 2022-11-03 ENCOUNTER — Telehealth: Payer: Self-pay

## 2022-11-03 NOTE — Telephone Encounter (Signed)
     Patient  visit on 10/25/2022  at [facility] was for Chronic pain syndrome.  Have you been able to follow up with your primary care physician? No. Patient has appointment 11/18/2022.   The patient was or was not able to obtain any needed medicine or equipment. Patient has obtained medication.  Are there diet recommendations that you are having difficulty following? No  Patient expresses understanding of discharge instructions and education provided has no other needs at this time.    Rodneshia Greenhouse [facility]  [facility] Population Health Community Resource Care Guide   ??[email or web address]  ?? [phone]   Website: [email or web address]  [email or web address]

## 2022-11-06 ENCOUNTER — Other Ambulatory Visit: Payer: Self-pay

## 2022-11-06 ENCOUNTER — Emergency Department (HOSPITAL_BASED_OUTPATIENT_CLINIC_OR_DEPARTMENT_OTHER)
Admission: EM | Admit: 2022-11-06 | Discharge: 2022-11-06 | Disposition: A | Discharge status: Home / Self Care | Payer: No Typology Code available for payment source | Attending: Emergency Medicine | Admitting: Emergency Medicine

## 2022-11-06 ENCOUNTER — Encounter (HOSPITAL_BASED_OUTPATIENT_CLINIC_OR_DEPARTMENT_OTHER): Payer: Self-pay | Admitting: Emergency Medicine

## 2022-11-06 DIAGNOSIS — Z79899 Other long term (current) drug therapy: Secondary | ICD-10-CM | POA: Insufficient documentation

## 2022-11-06 DIAGNOSIS — R519 Headache, unspecified: Secondary | ICD-10-CM | POA: Diagnosis present

## 2022-11-06 DIAGNOSIS — L03811 Cellulitis of head [any part, except face]: Secondary | ICD-10-CM | POA: Diagnosis not present

## 2022-11-06 DIAGNOSIS — G20C Parkinsonism, unspecified: Secondary | ICD-10-CM | POA: Insufficient documentation

## 2022-11-06 DIAGNOSIS — J449 Chronic obstructive pulmonary disease, unspecified: Secondary | ICD-10-CM | POA: Insufficient documentation

## 2022-11-06 DIAGNOSIS — Z7982 Long term (current) use of aspirin: Secondary | ICD-10-CM | POA: Insufficient documentation

## 2022-11-06 DIAGNOSIS — Z7951 Long term (current) use of inhaled steroids: Secondary | ICD-10-CM | POA: Diagnosis not present

## 2022-11-06 HISTORY — DX: Obsessive-compulsive disorder, unspecified: F42.9

## 2022-11-06 MED ORDER — CEPHALEXIN 250 MG PO CAPS
500.0000 mg | ORAL_CAPSULE | Freq: Once | ORAL | Status: AC
  Administered 2022-11-06: 500 mg via ORAL
  Filled 2022-11-06: qty 2

## 2022-11-06 MED ORDER — HYDROMORPHONE HCL 1 MG/ML IJ SOLN
1.0000 mg | Freq: Once | INTRAMUSCULAR | Status: AC
  Administered 2022-11-06: 1 mg via INTRAVENOUS
  Filled 2022-11-06: qty 1

## 2022-11-06 MED ORDER — MORPHINE SULFATE (PF) 4 MG/ML IV SOLN
4.0000 mg | Freq: Once | INTRAVENOUS | Status: AC
  Administered 2022-11-06: 4 mg via INTRAVENOUS
  Filled 2022-11-06: qty 1

## 2022-11-06 MED ORDER — CEPHALEXIN 500 MG PO CAPS: 500.0000 mg | ORAL_CAPSULE | Freq: Four times a day (QID) | ORAL | 0 refills | Status: AC

## 2022-11-06 MED ORDER — MUPIROCIN CALCIUM 2 % EX CREA: 1.0000 | TOPICAL_CREAM | Freq: Three times a day (TID) | CUTANEOUS | 0 refills | Status: AC

## 2022-11-06 NOTE — ED Notes (Signed)
Pt moaning and jerking ,  very agitated saying over and over he has OCD,  and crying when touched states had a pain shot this week  and sees his dr tom, states drove himself here needs pain meds

## 2022-11-06 NOTE — ED Provider Notes (Signed)
[facility] EMERGENCY DEPT Provider Note   CSN: [id number] Arrival date & time: 11/06/22  1420     History  Chief Complaint  Patient presents with   Headache    Head pain    Alan Brown is a 71 y.o. male with parkinson's disease, COPD, chronic pain syndrome, polysubstance dependence, bipolar 1 disorder, h/o PNA, GERD presents with headache.  Patient states that he had two spots on his scalp frozen at the dermatologist > 1 month ago. He states that he has OCD and so picks at his skin constantly, and could not leave the scabs on his scalp alone and was picking at them a lot.  He states that the pain in the area of one of the frozen areas started getting worse a couple of days ago and has gotten to the point that it is unbearable.  It is located in the front of the scalp.  There is a scab there but he has not noticed any purulent drainage.  He has not had any fevers.  He has noticed the area get more painful and more red.  Denies pain anywhere else.  He states that he is chronic pain syndrome and manages with the pain clinic as well as the [place].  He has both morphine and oxycodone to take at home but states that they are not helping this pain.   Headache  Social: Patient lives with his wife and 27 year old son. He is a Cytogeneticist.     Home Medications Prior to Admission medications   Medication Sig Start Date End Date Taking? Authorizing Provider  cephALEXin (KEFLEX) 500 MG capsule Take 1 capsule (500 mg total) by mouth 4 (four) times daily for 10 days. 11/06/22 11/16/22 Yes Loetta Rough, MD  mupirocin cream (BACTROBAN) 2 % Apply 1 Application topically 3 (three) times daily for 5 days. 11/06/22 11/11/22 Yes Loetta Rough, MD  acetaminophen (TYLENOL) 325 MG tablet  12/12/21   [provider]  albuterol (VENTOLIN HFA) 108 (90 Base) MCG/ACT inhaler Inhale 2 puffs into the lungs 4 (four) times daily as needed for wheezing or shortness of breath.      [provider]  ARIPiprazole (ABILIFY) 5 MG tablet Take 5 mg by mouth at bedtime. Patient not taking: Reported on 09/18/2022 09/27/21   [provider]  aspirin EC 81 MG tablet Take 81 mg by mouth daily.    [provider]  atorvastatin (LIPITOR) 20 MG tablet Take 20 mg by mouth at bedtime.     [provider]  busPIRone (BUSPAR) 15 MG tablet Take 15 mg by mouth 2 (two) times daily.    [provider]  Calcium Carb-Cholecalciferol 600-10 MG-MCG TABS Take 1 tablet by mouth 2 (two) times daily. Patient not taking: Reported on 09/18/2022 07/26/21   [provider]  calcium carbonate (OSCAL) 1500 (600 Ca) MG TABS tablet Take by mouth. Patient not taking: Reported on 09/18/2022    [provider]  Calcium Carbonate-Vitamin D 600-400 MG-UNIT tablet Take 1 tablet by mouth 2 (two) times daily. Patient not taking: Reported on 09/18/2022    [provider]  carbidopa-levodopa (SINEMET IR) 25-100 MG tablet Take 1 tablet by mouth 3 (three) times daily. Take 1 tablet at 8am/1pm/5pm    [provider]  cyclobenzaprine (FLEXERIL) 10 MG tablet Take 10 mg by mouth 3 (three) times daily as needed. Patient not taking: Reported on 09/18/2022 09/29/21   [provider]  diphenhydrAMINE (BENADRYL) 25  MG tablet Take 1 tablet (25 mg total) by mouth every 8 (eight) hours as needed. 01/04/22   Horton, Clabe Seal, DO  DULoxetine (CYMBALTA) 60 MG capsule Take 60 mg by mouth daily. For mood and pain    [provider]  gabapentin (NEURONTIN) 300 MG capsule Take by mouth. 02/03/22   [provider]  ibuprofen (ADVIL) 800 MG tablet Take 1 tablet (800 mg total) by mouth 3 (three) times daily. 07/03/22   Edwin Dada P, DO  JARDIANCE 10 MG TABS tablet Take 10 mg by mouth daily. Patient not taking: Reported on 09/18/2022 01/02/22   [provider]  lidocaine (LIDODERM) 5 % Place 1 patch onto the skin daily. Remove & Discard  patch within 12 hours or as directed by MD Patient not taking: Reported on 09/18/2022 07/17/22   Sabas Sous, MD  memantine (NAMENDA) 10 MG tablet Take 20 mg by mouth at bedtime.    [provider]  memantine (NAMENDA) 10 MG tablet TAKE ONE AND ONE-HALF TABLETS BY MOUTH AT BEDTIME (INCREASED DOSE) Patient not taking: Reported on 09/18/2022 07/05/21   [provider]  methocarbamol (ROBAXIN) 500 MG tablet Take 1 tablet (500 mg total) by mouth every 8 (eight) hours as needed for muscle spasms. Patient not taking: Reported on 09/18/2022 07/17/22   Sabas Sous, MD  metoCLOPramide (REGLAN) 10 MG tablet TAKE 1 TABLET BY MOUTH IN THE MORNING AND AT BEDTIME Patient not taking: Reported on 09/18/2022 06/20/22   Esterwood, Amy S, PA-C  mirtazapine (REMERON) 30 MG tablet Take 30 mg by mouth at bedtime. Patient not taking: Reported on 09/18/2022 10/29/21   [provider]  morphine (MS CONTIN) 15 MG 12 hr tablet Take 1 tablet by mouth in the morning and at bedtime. Patient not taking: Reported on 09/18/2022 08/09/20   [provider]  Multiple Vitamin (MULTIVITAMIN WITH MINERALS) TABS tablet Take 1 tablet by mouth daily. Patient not taking: Reported on 09/18/2022    [provider]  mupirocin ointment (BACTROBAN) 2 % SMARTSIG:Sparingly Topical 2-3 Times Daily PRN Patient not taking: Reported on 09/18/2022 10/17/21   [provider]  omeprazole (PRILOSEC) 40 MG capsule Take 40 mg by mouth 2 (two) times daily before a meal.    [provider]  Oxycodone HCl 20 MG TABS Take 1 tablet by mouth 5 (five) times daily. 08/09/20   [provider]  oxyCODONE-acetaminophen (PERCOCET/ROXICET) 5-325 MG tablet Take 1 tablet by mouth 2 (two) times daily. 07/09/22   [provider]  polyethylene glycol (MIRALAX) 17 g packet 17 grams in 8 oz of water daily 02/03/22   Lynann Bologna, MD  pregabalin (LYRICA) 300 MG capsule Take 300 mg by mouth at bedtime.     [provider]  primidone (MYSOLINE) 50 MG tablet Take 50 mg by mouth 3 (three) times daily. For tremors    [provider]  propranolol ER (INDERAL LA) 60 MG 24 hr capsule Take 60 mg by mouth daily.    [provider]  rasagiline (AZILECT) 0.5 MG TABS tablet TAKE TWO TABLETS BY MOUTH ONCE A DAY TO SLOW PROGRESSION OF PARKINSON'S DISEASE 08/08/21   [provider]  RELISTOR 150 MG TABS Take 3 tablets by mouth daily. 12/30/21   [provider]  risperiDONE (RISPERDAL) 0.5 MG tablet Take 1 tablet (0.5 mg total) by mouth at bedtime as needed for up to 30 days (for agitation). 04/27/19 01/17/22  Dimple Nanas, MD  tamsulosin (FLOMAX) 0.4  MG CAPS capsule Take 0.4 mg by mouth at bedtime.  Patient not taking: Reported on 09/18/2022    [provider]  Tapentadol HCl 100 MG TABS Take 150 mg by mouth 4 (four) times daily as needed (PAIN).  Patient not taking: Reported on 09/18/2022    [provider]  Tiotropium Bromide Monohydrate 2.5 MCG/ACT AERS Inhale 2 puffs into the lungs daily.    [provider]  trifluoperazine (STELAZINE) 1 MG tablet Take 3 mg by mouth at bedtime. Patient not taking: Reported on 09/18/2022    [provider]  XTAMPZA ER 18 MG C12A Take 1 capsule by mouth 2 (two) times daily. Patient not taking: Reported on 09/18/2022 07/09/22   [provider]      Allergies    Patient has no known allergies.    Review of Systems   Review of Systems  Neurological:  Positive for headaches.   Review of systems negative for f/c.  A 10 point review of systems was performed and is negative unless otherwise reported in HPI.  Physical Exam Updated Vital Signs BP (!) 140/82 (BP Location: Right Arm)   Pulse 86   Temp 98.2 F (36.8 C) (Oral)   Resp 18   SpO2 98%  Physical Exam General: Uncomfortable-appearing male, writhing in bed. Distractible from his pain at times during the interview. HEENT:  Sclera  anicteric, MMM, trachea midline. 7x5 cm area of yellow scab on the frontal scalp in the midline with surrounding TTP and poorly-defined erythema. No palpable fluctuance or induration. 3x3cm yellow scab on the crown of the scalp without any surrounding TTP or erythema. Please see image. Cardiology: RRR, no murmurs/rubs/gallops.  Resp: Normal respiratory rate and effort. CTAB, no wheezes, rhonchi, crackles.  Abd: Soft, non-tender, non-distended. No rebound tenderness or guarding.  GU: Deferred. MSK: Mild peripheral edema symmetric to BL LEs. No signs of trauma. Extremities without deformity or TTP. No cyanosis or clubbing. Skin: warm, dry. No rashes or lesions. Back: No CVA tenderness Neuro: A&Ox4, CNs II-XII grossly intact. MAEs. Sensation grossly intact.     Media Information   ED Results / Procedures / Treatments   Labs (all labs ordered are listed, but only abnormal results are displayed) Labs Reviewed - No data to display  EKG None  Radiology No results found.  Procedures Procedures    Medications Ordered in ED Medications  HYDROmorphone (DILAUDID) injection 1 mg (1 mg Intravenous Given 11/06/22 1554)  cephALEXin (KEFLEX) capsule 500 mg (500 mg Oral Given 11/06/22 1555)    ED Course/ Medical Decision Making/ A&P                          Medical Decision Making Risk Prescription drug management.   Patient is uncomfortable appearing but HDS.   Patient with pain to the skin of the scalp in an area that had an area treated by cryo with dermatology. Had not had any issues until the last couple of days. Denies any f/c, patient is afebrile and HDS here, very low c/f serious systemic bacterial infection or sepsis. No palpable abscess. Patient likely with impetigo given honey-colored crust, also consider cellulitis. Does not appear like tinea capitus. Will treat patient's pain here and informed him that we do not prescribe pain meds from the ED. He states he has an appt with  his pain doctor tomorrow at 11:30 so can discuss that then. Will prescribe keflex 500 mg q6h x 10 days and mupirocin  TID x 5 days.   Given DC instructions and return precautions. Patient reports understanding. All questions answered to patient's satisfaction.  Dispo: DC           Final Clinical Impression(s) / ED Diagnoses Final diagnoses:  Cellulitis of head except face    Rx / DC Orders ED Discharge Orders          Ordered    cephALEXin (KEFLEX) 500 MG capsule  4 times daily        11/06/22 1613    mupirocin cream (BACTROBAN) 2 %  3 times daily        11/06/22 1613             This note was created using dictation software, which may contain spelling or grammatical errors.    Loetta RoughNaasz, Reka Wist N, MD 11/06/22 [phone]

## 2022-11-06 NOTE — ED Notes (Signed)
Pt asking what pain med I was giving him when told dilaudid , pt states it doesn't work  and doesn't last and he was given that before and didn't help , the dr must not know about pain meds he states

## 2022-11-06 NOTE — ED Notes (Addendum)
Pt's spouse came to the nurses station asking if the patient could have something to drink. This RN explained he needed to wait to be seen by the provider before eating or drinking as he looked very uncomfortable and may need to have some type of scans completed to rule out any possible surgeries. Spouse states, "yes, he's very sick" I explained the dr would need to evaluate him first, Spouse states, "when will that happen?" Spouse explained as soon as either the PA or Dr was available one would be in there to see him.

## 2022-11-06 NOTE — ED Triage Notes (Signed)
Pt arrives by POV, stating his head is killing him. He has chronic pain and he is on pain medication. He has OCD and he picked a sore to the top of his head.

## 2022-11-06 NOTE — Discharge Instructions (Addendum)
Thank you for coming to [facility] Emergency Department. You were seen for pain of the skin of the head. We did an exam, labs, and imaging, and these showed a skin infection.  Please follow up with your primary care provider within 1 week to have the area reexamined. Please also follow up with your pain clinic provider tomorrow at 11:30AM as originally scheduled and take your home pain medication as originally prescribed. We have prescribed an antibiotic called Keflex to take 250 mg four times per day for 7 days.   You received IV pain medication while in the ED. Please do not drive or operate heavy machinery for the rest of the day.   Do not hesitate to return to the ED or call 911 if you experience: -Worsening symptoms -Purulent drainage from the area -Lightheadedness, passing out -Fevers/chills -Anything else that concerns you

## 2022-11-30 ENCOUNTER — Encounter (HOSPITAL_COMMUNITY): Payer: Self-pay

## 2022-11-30 ENCOUNTER — Other Ambulatory Visit: Payer: Self-pay

## 2022-11-30 ENCOUNTER — Emergency Department (HOSPITAL_COMMUNITY): Payer: No Typology Code available for payment source

## 2022-11-30 ENCOUNTER — Emergency Department (HOSPITAL_COMMUNITY)
Admission: EM | Admit: 2022-11-30 | Discharge: 2022-11-30 | Discharge status: Against Medical Advice | Payer: No Typology Code available for payment source | Attending: Student | Admitting: Student

## 2022-11-30 DIAGNOSIS — Z5321 Procedure and treatment not carried out due to patient leaving prior to being seen by health care provider: Secondary | ICD-10-CM | POA: Insufficient documentation

## 2022-11-30 DIAGNOSIS — R519 Headache, unspecified: Secondary | ICD-10-CM | POA: Diagnosis not present

## 2022-11-30 LAB — CBC WITH DIFFERENTIAL/PLATELET
Abs Immature Granulocytes: 0.03 10*3/uL (ref 0.00–0.07)
Basophils Absolute: 0 10*3/uL (ref 0.0–0.1)
Basophils Relative: 1 %
Eosinophils Absolute: 0.3 10*3/uL (ref 0.0–0.5)
Eosinophils Relative: 4 %
HCT: 39.6 % (ref 39.0–52.0)
Hemoglobin: 12.4 g/dL — ABNORMAL LOW (ref 13.0–17.0)
Immature Granulocytes: 1 %
Lymphocytes Relative: 26 %
Lymphs Abs: 1.7 10*3/uL (ref 0.7–4.0)
MCH: 28.6 pg (ref 26.0–34.0)
MCHC: 31.3 g/dL (ref 30.0–36.0)
MCV: 91.2 fL (ref 80.0–100.0)
Monocytes Absolute: 0.6 10*3/uL (ref 0.1–1.0)
Monocytes Relative: 9 %
Neutro Abs: 3.9 10*3/uL (ref 1.7–7.7)
Neutrophils Relative %: 59 %
Platelets: 209 10*3/uL (ref 150–400)
RBC: 4.34 MIL/uL (ref 4.22–5.81)
RDW: 14.4 % (ref 11.5–15.5)
WBC: 6.5 10*3/uL (ref 4.0–10.5)
nRBC: 0 % (ref 0.0–0.2)

## 2022-11-30 LAB — BASIC METABOLIC PANEL
Anion gap: 9 (ref 5–15)
BUN: 18 mg/dL (ref 8–23)
CO2: 25 mmol/L (ref 22–32)
Calcium: 9.4 mg/dL (ref 8.9–10.3)
Chloride: 104 mmol/L (ref 98–111)
Creatinine, Ser: 1.01 mg/dL (ref 0.61–1.24)
GFR, Estimated: >60 mL/min (ref >60)
Glucose, Bld: 110 mg/dL — ABNORMAL HIGH (ref 70–99)
Potassium: 3.5 mmol/L (ref 3.5–5.1)
Sodium: 138 mmol/L (ref 135–145)

## 2022-11-30 NOTE — ED Triage Notes (Signed)
Per EMS- patient is from home. Patient reports that he was napping and had a sudden headache that woke him up approx 2 hours ago.. Patient states pain 10/10. Patient denies dizziness or blurred vision.  Patient also c/o non healing sores on the back of his head following a dermatology procedure.

## 2022-11-30 NOTE — ED Provider Triage Note (Signed)
Emergency Medicine Provider Triage Evaluation Note  Alan Brown , a 71 y.o. male  was evaluated in triage.  Pt complains of headache.  Patient states he has had headaches like this before, this headache started about an hour prior to arrival.  It was present when his wife woke him up.  Denies being woken up by the pain in his head.  He denies any vision changes, lateralized weakness or numbness, fevers, neck stiffness.    Review of Systems  Per HPI  Physical Exam  BP 119/69 (BP Location: Left Arm)   Pulse 81   Temp 98.6 F (37 C) (Oral)   Resp 18   SpO2 94%  Gen:   Awake, no distress   Resp:  Normal effort  MSK:   Moves extremities without difficulty  Other:  Oriented x 3.  Cranial nerves II to XII grossly intact.  Upper and lower strength is symmetric bilaterally but slightly reduced.  Forehead with granulation tissue/wound healing.  Slight surrounding erythema but not particularly warm, no purulence.  Medical Decision Making  Medically screening exam initiated at 6:14 PM.  Appropriate orders placed.  Alan Brown was informed that the remainder of the evaluation will be completed by another provider, this initial triage assessment does not replace that evaluation, and the importance of remaining in the ED until their evaluation is complete.     Theron Arista, PA-C 11/30/22 1816

## 2023-01-05 NOTE — Progress Notes (Deleted)
Assessment/Plan:   1.  Parkinsons Disease  -I do think he looks more parkinsonian than he did.  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  He is on levodopa today.     -We talked about the importance of safe, cardiovascular exercise.  He admits that he has been out of exercise for the last 7 months and really would like to get back into it.  He states that his exercise equipment has been in storage.  I gave him information on community exercise programs for Parkinson's disease, as well as scholarship programs to these exercise programs.  -Patient is going to follow-up with his [facility] neurologist.  He would like to follow-up with me on an as-needed basis.  -Discussed with the patient that if he wants to maintain care here, we have no issue with that, but if he wants to maintain care with the [facility], then he will need to stay there.  He has been coming here once every 2-3 years for various opinions, and that is just not good for his Parkinson's care.  2.  Chronic pain syndrome  -pt is on a significant amount of narcotic medication, which should be minimalized in patients of this age group, but especially in patients who have Parkinson's disease.  3.    Subjective:   Alan Brown was seen today in follow up at the request of the [facility] for evaluation for Parkinsons disease.  I have not seen the patient in 2-1/2 years.  I saw the patient 1 time in the year 2019 and 1 time in 2021.  When I saw the patient in 2021, he did look a bit parkinsonian, with mild rest tremor and very mild bradykinesia.  However, I saw him on levodopa.  He wanted an evaluation for DBS, and I told him that I would not recommend that given mild symptoms and how early he was on in the disease process.  The patient wanted to follow-up from there with his neurologist at the [place].  He is referred back by the [facility] for "new onset movement disorder feet, possibly dystonia, and also recent onset dysphagia."  Medical records from his neurologist are not sent.  He is on a significant amount of narcotic pain medication, receiving 116 tablets of oxycodone/acetaminophen 10/325 permonth in addition to 58 tablets of 15 mg morphine sulfate.  Despite that, he has been in the ER several times with complaints of pain.   Current movement disorder medications: Carbidopa/levodopa 25/100, 2 tablets 3 times per day Rasagiline, 1 mg daily Memantine, 10 mg twice per day  PREVIOUS MEDICATIONS: risperdone (for years and developed drug induced Parkinsons Disease); lithium in past; primidone (up to 50 mg 3 times per day); propranolol LA, 120 mg daily  ALLERGIES:  No Known Allergies  CURRENT MEDICATIONS:  Outpatient Encounter Medications as of 01/07/2023  Medication Sig   acetaminophen (TYLENOL) 325 MG tablet    albuterol (VENTOLIN HFA) 108 (90 Base) MCG/ACT inhaler Inhale 2 puffs into the lungs 4 (four) times daily as needed for wheezing or shortness of breath.    ARIPiprazole (ABILIFY) 5 MG tablet Take 5 mg by mouth at bedtime. (Patient not taking: Reported on 09/18/2022)   aspirin EC 81 MG tablet Take 81 mg by mouth daily.   atorvastatin (LIPITOR) 20 MG tablet Take 20 mg by mouth at bedtime.    busPIRone (BUSPAR)  15 MG tablet Take 15 mg by mouth 2 (two) times daily.   Calcium Carb-Cholecalciferol 600-10 MG-MCG TABS Take 1 tablet by mouth 2 (two) times daily. (Patient not taking: Reported on 09/18/2022)   calcium carbonate (OSCAL) 1500 (600 Ca) MG TABS tablet Take by mouth. (Patient not taking: Reported on 09/18/2022)   Calcium Carbonate-Vitamin D 600-400 MG-UNIT tablet Take 1 tablet by mouth 2 (two) times daily. (Patient not taking: Reported on 09/18/2022)   carbidopa-levodopa (SINEMET IR) 25-100 MG tablet Take 1 tablet by mouth 3 (three) times daily. Take 1 tablet at 8am/1pm/5pm   cyclobenzaprine (FLEXERIL) 10 MG  tablet Take 10 mg by mouth 3 (three) times daily as needed. (Patient not taking: Reported on 09/18/2022)   diphenhydrAMINE (BENADRYL) 25 MG tablet Take 1 tablet (25 mg total) by mouth every 8 (eight) hours as needed.   DULoxetine (CYMBALTA) 60 MG capsule Take 60 mg by mouth daily. For mood and pain   gabapentin (NEURONTIN) 300 MG capsule Take by mouth.   ibuprofen (ADVIL) 800 MG tablet Take 1 tablet (800 mg total) by mouth 3 (three) times daily.   JARDIANCE 10 MG TABS tablet Take 10 mg by mouth daily. (Patient not taking: Reported on 09/18/2022)   lidocaine (LIDODERM) 5 % Place 1 patch onto the skin daily. Remove & Discard patch within 12 hours or as directed by MD (Patient not taking: Reported on 09/18/2022)   memantine (NAMENDA) 10 MG tablet Take 20 mg by mouth at bedtime.   memantine (NAMENDA) 10 MG tablet TAKE ONE AND ONE-HALF TABLETS BY MOUTH AT BEDTIME (INCREASED DOSE) (Patient not taking: Reported on 09/18/2022)   methocarbamol (ROBAXIN) 500 MG tablet Take 1 tablet (500 mg total) by mouth every 8 (eight) hours as needed for muscle spasms. (Patient not taking: Reported on 09/18/2022)   metoCLOPramide (REGLAN) 10 MG tablet TAKE 1 TABLET BY MOUTH IN THE MORNING AND AT BEDTIME (Patient not taking: Reported on 09/18/2022)   mirtazapine (REMERON) 30 MG tablet Take 30 mg by mouth at bedtime. (Patient not taking: Reported on 09/18/2022)   morphine (MS CONTIN) 15 MG 12 hr tablet Take 1 tablet by mouth in the morning and at bedtime. (Patient not taking: Reported on 09/18/2022)   Multiple Vitamin (MULTIVITAMIN WITH MINERALS) TABS tablet Take 1 tablet by mouth daily. (Patient not taking: Reported on 09/18/2022)   mupirocin ointment (BACTROBAN) 2 % SMARTSIG:Sparingly Topical 2-3 Times Daily PRN (Patient not taking: Reported on 09/18/2022)   omeprazole (PRILOSEC) 40 MG capsule Take 40 mg by mouth 2 (two) times daily before a meal.   Oxycodone HCl 20 MG TABS Take 1 tablet by mouth 5 (five) times daily.    oxyCODONE-acetaminophen (PERCOCET/ROXICET) 5-325 MG tablet Take 1 tablet by mouth 2 (two) times daily.   polyethylene glycol (MIRALAX) 17 g packet 17 grams in 8 oz of water daily   pregabalin (LYRICA) 300 MG capsule Take 300 mg by mouth at bedtime.   primidone (MYSOLINE) 50 MG tablet Take 50 mg by mouth 3 (three) times daily. For tremors   propranolol ER (INDERAL LA) 60 MG 24 hr capsule Take 60 mg by mouth daily.   rasagiline (AZILECT) 0.5 MG TABS tablet TAKE TWO TABLETS BY MOUTH ONCE A DAY TO SLOW PROGRESSION OF PARKINSON'S DISEASE   RELISTOR 150 MG TABS Take 3 tablets by mouth daily.   risperiDONE (RISPERDAL) 0.5 MG tablet Take 1 tablet (0.5 mg total) by mouth at bedtime as needed for up to 30 days (for agitation).   tamsulosin (  FLOMAX) 0.4 MG CAPS capsule Take 0.4 mg by mouth at bedtime.  (Patient not taking: Reported on 09/18/2022)   Tapentadol HCl 100 MG TABS Take 150 mg by mouth 4 (four) times daily as needed (PAIN).  (Patient not taking: Reported on 09/18/2022)   Tiotropium Bromide Monohydrate 2.5 MCG/ACT AERS Inhale 2 puffs into the lungs daily.   trifluoperazine (STELAZINE) 1 MG tablet Take 3 mg by mouth at bedtime. (Patient not taking: Reported on 09/18/2022)   XTAMPZA ER 18 MG C12A Take 1 capsule by mouth 2 (two) times daily. (Patient not taking: Reported on 09/18/2022)   No facility-administered encounter medications on file as of 01/07/2023.    Objective:   PHYSICAL EXAMINATION:    VITALS:   There were no vitals filed for this visit.   GEN:  The patient appears stated age and is in NAD. HEENT:  Normocephalic, atraumatic.  The mucous membranes are moist. The superficial temporal arteries are without ropiness or tenderness. CV:  RRR Lungs:  CTAB Neck/HEME:  There are no carotid bruits bilaterally.  Neurological examination:  Orientation: The patient is alert and oriented x3. Cranial nerves: There is good facial symmetry with facial hypomimia. The speech is fluent and clear.  Soft palate rises symmetrically and there is no tongue deviation. Hearing is intact to conversational tone. Sensation: Sensation is intact to light touch throughout Motor: Strength is at least antigravity x4.  Movement examination: Tone: There is normal tone in the upper and lower extremities Abnormal movements:  mild R>LUE rest tremor Coordination:  There is  decremation with RAM's, only with  alternation of supination/pronation of the forearm on the right Gait and Station: The patient has mild difficulty arising out of a deep-seated chair without the use of the hands. The patient's stride length is short stepped and just slightly shuffling.   I have reviewed and interpreted the following labs independently    Chemistry      Component Value Date/Time   NA 138 11/30/2022 1926   K 3.5 11/30/2022 1926   CL 104 11/30/2022 1926   CO2 25 11/30/2022 1926   BUN 18 11/30/2022 1926   CREATININE 1.01 11/30/2022 1926      Component Value Date/Time   CALCIUM 9.4 11/30/2022 1926   ALKPHOS 74 01/04/2022 1344   AST 17 01/04/2022 1344   ALT 9 01/04/2022 1344   BILITOT 0.8 01/04/2022 1344       Lab Results  Component Value Date   WBC 6.5 11/30/2022   HGB 12.4 (L) 11/30/2022   HCT 39.6 11/30/2022   MCV 91.2 11/30/2022   PLT 209 11/30/2022    Lab Results  Component Value Date   TSH 0.678 05/10/2020     Total time spent on today's visit was *** minutes, including both face-to-face time and nonface-to-face time.  Time included that spent on review of records (prior notes available to me/labs/imaging if pertinent), discussing treatment and goals, answering patient's questions and coordinating care.  Cc:  [facility]

## 2023-01-07 ENCOUNTER — Ambulatory Visit: Payer: Medicare PPO | Admitting: Neurology

## 2023-01-11 ENCOUNTER — Encounter (HOSPITAL_BASED_OUTPATIENT_CLINIC_OR_DEPARTMENT_OTHER): Payer: Self-pay | Admitting: Emergency Medicine

## 2023-01-11 ENCOUNTER — Other Ambulatory Visit: Payer: Self-pay

## 2023-01-11 ENCOUNTER — Emergency Department (HOSPITAL_BASED_OUTPATIENT_CLINIC_OR_DEPARTMENT_OTHER)
Admission: EM | Admit: 2023-01-11 | Discharge: 2023-01-11 | Disposition: A | Discharge status: Home / Self Care | Payer: Medicare PPO | Attending: Emergency Medicine | Admitting: Emergency Medicine

## 2023-01-11 DIAGNOSIS — M9980 Other biomechanical lesions of head region: Secondary | ICD-10-CM | POA: Diagnosis present

## 2023-01-11 DIAGNOSIS — Z7982 Long term (current) use of aspirin: Secondary | ICD-10-CM | POA: Diagnosis not present

## 2023-01-11 DIAGNOSIS — R519 Headache, unspecified: Secondary | ICD-10-CM

## 2023-01-11 MED ORDER — VALACYCLOVIR HCL 500 MG PO TABS
1000.0000 mg | ORAL_TABLET | Freq: Once | ORAL | Status: AC
Start: 2023-01-11 — End: 2023-01-11
  Administered 2023-01-11: 1000 mg via ORAL
  Filled 2023-01-11: qty 2

## 2023-01-11 MED ORDER — VALACYCLOVIR HCL 1 G PO TABS: 1000.0000 mg | ORAL_TABLET | Freq: Three times a day (TID) | ORAL | 0 refills | Status: AC

## 2023-01-11 NOTE — ED Triage Notes (Signed)
Picked at head now has a wound on top of head. Reports OCD made him pick at it. Reports 10/10  Took 4 or 5 OxyContin and 1 morphine pill prior to coming.  Wife is concerned about the over dose.  Also may have a single lesion on upper back.   Confusion

## 2023-01-11 NOTE — Discharge Instructions (Signed)
Take your medications only as directed.  Follow-up with your pain management specialist next week.  Begin taking Valtrex as prescribed.

## 2023-01-11 NOTE — ED Notes (Signed)
Placed bacitracin on quarter-size cicular wound on pt.'s head and covered with a tegaderm

## 2023-01-11 NOTE — ED Provider Notes (Signed)
[place] EMERGENCY DEPARTMENT AT [facility] Provider Note   CSN: [id number] Arrival date & time: 01/11/23  0076     History  Chief Complaint  Patient presents with   head wound    Alan Brown is a 72 y.o. male.  Patient is a 73 year old male with history of chronic pain syndrome, chronic wound to the top of his head for which he is following with dermatology.  Patient presents today with complaints of pain from the wound on his head and also lesions to the back of his neck.  Patient was experiencing increased pain, so took a dose of morphine and 5 to 10 mg OxyContin tablets over the past 2-1/2 hours.  He was also picking at the wound on his head and causing it to bleed.  Wife also expressed concerns in triage for him taking too much of the medication.  Other than pain, patient has no complaints and is wide-awake and talking.  The history is provided by the patient.       Home Medications Prior to Admission medications   Medication Sig Start Date End Date Taking? Authorizing Provider  acetaminophen (TYLENOL) 325 MG tablet  12/12/21   [provider]  albuterol (VENTOLIN HFA) 108 (90 Base) MCG/ACT inhaler Inhale 2 puffs into the lungs 4 (four) times daily as needed for wheezing or shortness of breath.     [provider]  ARIPiprazole (ABILIFY) 5 MG tablet Take 5 mg by mouth at bedtime. Patient not taking: Reported on 09/18/2022 09/27/21   [provider]  aspirin EC 81 MG tablet Take 81 mg by mouth daily.    [provider]  atorvastatin (LIPITOR) 20 MG tablet Take 20 mg by mouth at bedtime.     [provider]  busPIRone (BUSPAR) 15 MG tablet Take 15 mg by mouth 2 (two) times daily.    [provider]  Calcium Carb-Cholecalciferol 600-10 MG-MCG TABS Take 1 tablet by mouth 2 (two) times daily. Patient not taking: Reported on 09/18/2022 07/26/21   [provider]  calcium carbonate (OSCAL) 1500 (600  Ca) MG TABS tablet Take by mouth. Patient not taking: Reported on 09/18/2022    [provider]  Calcium Carbonate-Vitamin D 600-400 MG-UNIT tablet Take 1 tablet by mouth 2 (two) times daily. Patient not taking: Reported on 09/18/2022    [provider]  carbidopa-levodopa (SINEMET IR) 25-100 MG tablet Take 1 tablet by mouth 3 (three) times daily. Take 1 tablet at 8am/1pm/5pm    [provider]  cyclobenzaprine (FLEXERIL) 10 MG tablet Take 10 mg by mouth 3 (three) times daily as needed. Patient not taking: Reported on 09/18/2022 09/29/21   [provider]  diphenhydrAMINE (BENADRYL) 25 MG tablet Take 1 tablet (25 mg total) by mouth every 8 (eight) hours as needed. 01/04/22   Horton, Clabe Seal, DO  DULoxetine (CYMBALTA) 60 MG capsule Take 60 mg by mouth daily. For mood and pain    [provider]  gabapentin (NEURONTIN) 300 MG capsule Take by mouth. 02/03/22   [provider]  ibuprofen (ADVIL) 800 MG tablet Take 1 tablet (800 mg total) by mouth 3 (three) times daily. 07/03/22   Edwin Dada P, DO  JARDIANCE 10 MG TABS tablet Take 10 mg by mouth daily. Patient not taking: Reported on 09/18/2022 01/02/22   [provider]  lidocaine (LIDODERM) 5 % Place 1 patch onto the skin daily. Remove & Discard patch within 12 hours or as directed by  MD Patient not taking: Reported on 09/18/2022 07/17/22   Maudie Flakes, MD  memantine (NAMENDA) 10 MG tablet Take 20 mg by mouth at bedtime.    [provider]  memantine (NAMENDA) 10 MG tablet TAKE ONE AND ONE-HALF TABLETS BY MOUTH AT BEDTIME (INCREASED DOSE) Patient not taking: Reported on 09/18/2022 07/05/21   [provider]  methocarbamol (ROBAXIN) 500 MG tablet Take 1 tablet (500 mg total) by mouth every 8 (eight) hours as needed for muscle spasms. Patient not taking: Reported on 09/18/2022 07/17/22   Maudie Flakes, MD  metoCLOPramide (REGLAN) 10 MG tablet TAKE 1 TABLET BY MOUTH IN THE  MORNING AND AT BEDTIME Patient not taking: Reported on 09/18/2022 06/20/22   Esterwood, Amy S, PA-C  mirtazapine (REMERON) 30 MG tablet Take 30 mg by mouth at bedtime. Patient not taking: Reported on 09/18/2022 10/29/21   [provider]  morphine (MS CONTIN) 15 MG 12 hr tablet Take 1 tablet by mouth in the morning and at bedtime. Patient not taking: Reported on 09/18/2022 08/09/20   [provider]  Multiple Vitamin (MULTIVITAMIN WITH MINERALS) TABS tablet Take 1 tablet by mouth daily. Patient not taking: Reported on 09/18/2022    [provider]  mupirocin ointment (BACTROBAN) 2 % SMARTSIG:Sparingly Topical 2-3 Times Daily PRN Patient not taking: Reported on 09/18/2022 10/17/21   [provider]  omeprazole (PRILOSEC) 40 MG capsule Take 40 mg by mouth 2 (two) times daily before a meal.    [provider]  Oxycodone HCl 20 MG TABS Take 1 tablet by mouth 5 (five) times daily. 08/09/20   [provider]  oxyCODONE-acetaminophen (PERCOCET/ROXICET) 5-325 MG tablet Take 1 tablet by mouth 2 (two) times daily. 07/09/22   [provider]  polyethylene glycol (MIRALAX) 17 g packet 17 grams in 8 oz of water daily 02/03/22   Jackquline Denmark, MD  pregabalin (LYRICA) 300 MG capsule Take 300 mg by mouth at bedtime.    [provider]  primidone (MYSOLINE) 50 MG tablet Take 50 mg by mouth 3 (three) times daily. For tremors    [provider]  propranolol ER (INDERAL LA) 60 MG 24 hr capsule Take 60 mg by mouth daily.    [provider]  rasagiline (AZILECT) 0.5 MG TABS tablet TAKE TWO TABLETS BY MOUTH ONCE A DAY TO SLOW PROGRESSION OF PARKINSON'S DISEASE 08/08/21   [provider]  RELISTOR 150 MG TABS Take 3 tablets by mouth daily. 12/30/21   [provider]  risperiDONE (RISPERDAL) 0.5 MG tablet Take 1 tablet (0.5 mg total) by mouth at bedtime as needed for up to 30 days (for agitation). 04/27/19 01/17/22  Amin, Jeanella Flattery, MD  tamsulosin (FLOMAX) 0.4 MG CAPS capsule Take 0.4 mg by mouth at bedtime.  Patient not taking: Reported on 09/18/2022    [provider]  Tapentadol HCl 100 MG TABS Take 150 mg by mouth 4 (four) times daily as needed (PAIN).  Patient not taking: Reported on 09/18/2022    [provider]  Tiotropium Bromide Monohydrate 2.5 MCG/ACT AERS Inhale 2 puffs into the lungs daily.    [provider]  trifluoperazine (STELAZINE) 1 MG tablet Take 3 mg by mouth at bedtime. Patient not taking: Reported on 09/18/2022    [provider]  XTAMPZA ER 18 MG C12A Take 1 capsule by mouth 2 (two) times daily. Patient not taking: Reported on 09/18/2022 07/09/22   [provider]      Allergies  Patient has no known allergies.    Review of Systems   Review of Systems  All other systems reviewed and are negative.   Physical Exam Updated Vital Signs BP (!) 151/78   Pulse 86   Temp 98 F (36.7 C)   Resp 18   SpO2 100%  Physical Exam Vitals and nursing note reviewed.  Constitutional:      General: He is not in acute distress.    Appearance: He is well-developed. He is not diaphoretic.  HENT:     Head: Normocephalic and atraumatic.     Comments: There is a nickel sized, round lesion noted to the top of the head.  It appears to be healing by secondary intention.  There is mild surrounding erythema, but no purulent drainage. Neck:     Comments: There are several vesicular appearing lesions noted to the back of the neck just left of midline Cardiovascular:     Rate and Rhythm: Normal rate and regular rhythm.     Heart sounds: No murmur heard.    No friction rub.  Pulmonary:     Effort: Pulmonary effort is normal. No respiratory distress.     Breath sounds: Normal breath sounds. No wheezing or rales.  Abdominal:     General: Bowel sounds are normal. There is no distension.     Palpations: Abdomen is soft.     Tenderness: There is no abdominal  tenderness.  Musculoskeletal:        General: Normal range of motion.     Cervical back: Normal range of motion and neck supple.  Skin:    General: Skin is warm and dry.  Neurological:     Mental Status: He is alert and oriented to person, place, and time.     Coordination: Coordination normal.     ED Results / Procedures / Treatments   Labs (all labs ordered are listed, but only abnormal results are displayed) Labs Reviewed - No data to display  EKG None  Radiology No results found.  Procedures Procedures    Medications Ordered in ED Medications  valACYclovir (VALTREX) tablet 1,000 mg (has no administration in time range)    ED Course/ Medical Decision Making/ A&P  Patient with history of chronic pain syndrome and chronic pain to his scalp lesion presenting with complaints of pain in this area.  He also took extra doses of his pain medication to try to relieve the pain.  Patient is wide-awake and alert after taking 5 10 mg OxyContin tablets in addition to his normal dose of morphine.  He also has what appears to be shingles to the back of his neck.  This will be treated with acyclovir.  Patient has been observed for 2 hours and seems fine.  He denies suicidal ideation or trying to harm himself.  He was simply trying to make the pain go away.  At this point I feel as though he can be discharged and follow-up with his pain management specialist.  Final Clinical Impression(s) / ED Diagnoses Final diagnoses:  None    Rx / DC Orders ED Discharge Orders     None         Veryl Speak, MD 01/11/23 0425

## 2023-01-16 ENCOUNTER — Ambulatory Visit (HOSPITAL_BASED_OUTPATIENT_CLINIC_OR_DEPARTMENT_OTHER): Payer: Medicare PPO | Admitting: Internal Medicine

## 2023-01-29 ENCOUNTER — Emergency Department (HOSPITAL_COMMUNITY)
Admission: EM | Admit: 2023-01-29 | Discharge: 2023-01-30 | Disposition: A | Discharge status: Home / Self Care | Payer: No Typology Code available for payment source | Attending: Emergency Medicine | Admitting: Emergency Medicine

## 2023-01-29 DIAGNOSIS — Z7982 Long term (current) use of aspirin: Secondary | ICD-10-CM | POA: Insufficient documentation

## 2023-01-29 DIAGNOSIS — R7309 Other abnormal glucose: Secondary | ICD-10-CM | POA: Diagnosis not present

## 2023-01-29 DIAGNOSIS — R825 Elevated urine levels of drugs, medicaments and biological substances: Secondary | ICD-10-CM | POA: Insufficient documentation

## 2023-01-29 DIAGNOSIS — R45851 Suicidal ideations: Secondary | ICD-10-CM | POA: Insufficient documentation

## 2023-01-29 DIAGNOSIS — G20A1 Parkinson's disease without dyskinesia, without mention of fluctuations: Secondary | ICD-10-CM | POA: Diagnosis not present

## 2023-01-29 DIAGNOSIS — Z87891 Personal history of nicotine dependence: Secondary | ICD-10-CM | POA: Insufficient documentation

## 2023-01-29 DIAGNOSIS — R456 Violent behavior: Secondary | ICD-10-CM | POA: Insufficient documentation

## 2023-01-29 DIAGNOSIS — R4689 Other symptoms and signs involving appearance and behavior: Secondary | ICD-10-CM

## 2023-01-29 LAB — CBC WITH DIFFERENTIAL/PLATELET
Abs Immature Granulocytes: 0.09 10*3/uL — ABNORMAL HIGH (ref 0.00–0.07)
Basophils Absolute: 0.1 10*3/uL (ref 0.0–0.1)
Basophils Relative: 1 %
Eosinophils Absolute: 0.4 10*3/uL (ref 0.0–0.5)
Eosinophils Relative: 4 %
HCT: 42.5 % (ref 39.0–52.0)
Hemoglobin: 13.8 g/dL (ref 13.0–17.0)
Immature Granulocytes: 1 %
Lymphocytes Relative: 29 %
Lymphs Abs: 2.7 10*3/uL (ref 0.7–4.0)
MCH: 29.7 pg (ref 26.0–34.0)
MCHC: 32.5 g/dL (ref 30.0–36.0)
MCV: 91.4 fL (ref 80.0–100.0)
Monocytes Absolute: 0.7 10*3/uL (ref 0.1–1.0)
Monocytes Relative: 7 %
Neutro Abs: 5.4 10*3/uL (ref 1.7–7.7)
Neutrophils Relative %: 58 %
Platelets: 227 10*3/uL (ref 150–400)
RBC: 4.65 MIL/uL (ref 4.22–5.81)
RDW: 15.8 % — ABNORMAL HIGH (ref 11.5–15.5)
WBC: 9.3 10*3/uL (ref 4.0–10.5)
nRBC: 0 % (ref 0.0–0.2)

## 2023-01-29 LAB — COMPREHENSIVE METABOLIC PANEL
ALT: 11 U/L (ref 0–44)
AST: 19 U/L (ref 15–41)
Albumin: 3.8 g/dL (ref 3.5–5.0)
Alkaline Phosphatase: 75 U/L (ref 38–126)
Anion gap: 11 (ref 5–15)
BUN: 21 mg/dL (ref 8–23)
CO2: 27 mmol/L (ref 22–32)
Calcium: 9.4 mg/dL (ref 8.9–10.3)
Chloride: 100 mmol/L (ref 98–111)
Creatinine, Ser: 1.1 mg/dL (ref 0.61–1.24)
GFR, Estimated: >60 mL/min (ref >60)
Glucose, Bld: 142 mg/dL — ABNORMAL HIGH (ref 70–99)
Potassium: 3.9 mmol/L (ref 3.5–5.1)
Sodium: 138 mmol/L (ref 135–145)
Total Bilirubin: <0.1 mg/dL — ABNORMAL LOW (ref 0.3–1.2)
Total Protein: 7.4 g/dL (ref 6.5–8.1)

## 2023-01-29 LAB — ETHANOL: Alcohol, Ethyl (B): <10 mg/dL (ref <10)

## 2023-01-29 MED ORDER — MIDAZOLAM HCL 2 MG/2ML IJ SOLN
5.0000 mg | Freq: Once | INTRAMUSCULAR | Status: AC
  Administered 2023-01-29: 5 mg via INTRAMUSCULAR
  Filled 2023-01-29: qty 6

## 2023-01-29 NOTE — ED Notes (Signed)
Pt highly aggressive and was given versed IM per MD order. Sitter order placed and no staff available to sit with pt. Charge nurse made aware

## 2023-01-29 NOTE — ED Notes (Signed)
Pt came in ivc'd by gpd waiting for the doctor to do first exam. Ivc paperwork attached to the clipboard in orange zone.

## 2023-01-29 NOTE — ED Provider Notes (Signed)
[facility] Provider Note   CSN: [id number] Arrival date & time: 01/29/23  2218     History  Chief Complaint  Patient presents with   Suicidal    Alan Brown is a 72 y.o. male.  HPI   Patient with medical history including depression, GERD, pneumonia, CVA, Parkinson's disease, bipolar presents in police custody IVC aid due to thoughts of suicide.  Patient told his crisis counselor that he was going to numb himself up to off himself.  Family was concerned that he has access to guns and currently has a gun in his car.  On my evaluation patient was belligerent, unable to answer many questions, threatening staff and myself.  Able to redirect him to calm down but he refused to talk to me saying that he just wants to talk to his psychiatrist.  He is agreeable to lab work at this time  Home Medications Prior to Admission medications   Medication Sig Start Date End Date Taking? Authorizing Provider  acetaminophen (TYLENOL) 325 MG tablet  12/12/21   [provider]  albuterol (VENTOLIN HFA) 108 (90 Base) MCG/ACT inhaler Inhale 2 puffs into the lungs 4 (four) times daily as needed for wheezing or shortness of breath.     [provider]  ARIPiprazole (ABILIFY) 5 MG tablet Take 5 mg by mouth at bedtime. Patient not taking: Reported on 09/18/2022 09/27/21   [provider]  aspirin EC 81 MG tablet Take 81 mg by mouth daily.    [provider]  atorvastatin (LIPITOR) 20 MG tablet Take 20 mg by mouth at bedtime.     [provider]  busPIRone (BUSPAR) 15 MG tablet Take 15 mg by mouth 2 (two) times daily.    [provider]  Calcium Carb-Cholecalciferol 600-10 MG-MCG TABS Take 1 tablet by mouth 2 (two) times daily. Patient not taking: Reported on 09/18/2022 07/26/21   [provider]  calcium carbonate (OSCAL) 1500 (600 Ca) MG TABS tablet Take by mouth. Patient not taking: Reported  on 09/18/2022    [provider]  Calcium Carbonate-Vitamin D 600-400 MG-UNIT tablet Take 1 tablet by mouth 2 (two) times daily. Patient not taking: Reported on 09/18/2022    [provider]  carbidopa-levodopa (SINEMET IR) 25-100 MG tablet Take 1 tablet by mouth 3 (three) times daily. Take 1 tablet at 8am/1pm/5pm    [provider]  cyclobenzaprine (FLEXERIL) 10 MG tablet Take 10 mg by mouth 3 (three) times daily as needed. Patient not taking: Reported on 09/18/2022 09/29/21   [provider]  diphenhydrAMINE (BENADRYL) 25 MG tablet Take 1 tablet (25 mg total) by mouth every 8 (eight) hours as needed. 01/04/22   Horton, Alvin Critchley, DO  DULoxetine (CYMBALTA) 60 MG capsule Take 60 mg by mouth daily. For mood and pain    [provider]  gabapentin (NEURONTIN) 300 MG capsule Take by mouth. 02/03/22   [provider]  ibuprofen (ADVIL) 800 MG tablet Take 1 tablet (800 mg total) by mouth 3 (three) times daily. [id number]   Campbell Stall P, DO  JARDIANCE 10 MG TABS tablet Take 10 mg by mouth daily. Patient not taking: Reported on 09/18/2022 01/02/22   [provider]  lidocaine (LIDODERM) 5 % Place 1 patch onto the skin daily. Remove & Discard patch within 12 hours or as directed by MD Patient not taking: Reported on 09/18/2022 07/17/22   Maudie Flakes, MD  memantine [facility]) 10  MG tablet Take 20 mg by mouth at bedtime.    [provider]  memantine (NAMENDA) 10 MG tablet TAKE ONE AND ONE-HALF TABLETS BY MOUTH AT BEDTIME (INCREASED DOSE) Patient not taking: Reported on 09/18/2022 07/05/21   [provider]  methocarbamol (ROBAXIN) 500 MG tablet Take 1 tablet (500 mg total) by mouth every 8 (eight) hours as needed for muscle spasms. Patient not taking: Reported on 09/18/2022 07/17/22   Maudie Flakes, MD  metoCLOPramide (REGLAN) 10 MG tablet TAKE 1 TABLET BY MOUTH IN THE MORNING AND AT BEDTIME Patient not taking: Reported on 09/18/2022  06/20/22   Esterwood, Amy S, PA-C  mirtazapine (REMERON) 30 MG tablet Take 30 mg by mouth at bedtime. Patient not taking: Reported on 09/18/2022 10/29/21   [provider]  morphine (MS CONTIN) 15 MG 12 hr tablet Take 1 tablet by mouth in the morning and at bedtime. Patient not taking: Reported on 09/18/2022 08/09/20   [provider]  Multiple Vitamin (MULTIVITAMIN WITH MINERALS) TABS tablet Take 1 tablet by mouth daily. Patient not taking: Reported on 09/18/2022    [provider]  mupirocin ointment (BACTROBAN) 2 % SMARTSIG:Sparingly Topical 2-3 Times Daily PRN Patient not taking: Reported on 09/18/2022 10/17/21   [provider]  omeprazole (PRILOSEC) 40 MG capsule Take 40 mg by mouth 2 (two) times daily before a meal.    [provider]  Oxycodone HCl 20 MG TABS Take 1 tablet by mouth 5 (five) times daily. 08/09/20   [provider]  oxyCODONE-acetaminophen (PERCOCET/ROXICET) 5-325 MG tablet Take 1 tablet by mouth 2 (two) times daily. 07/09/22   [provider]  polyethylene glycol (MIRALAX) 17 g packet 17 grams in 8 oz of water daily 02/03/22   Jackquline Denmark, MD  pregabalin (LYRICA) 300 MG capsule Take 300 mg by mouth at bedtime.    [provider]  primidone (MYSOLINE) 50 MG tablet Take 50 mg by mouth 3 (three) times daily. For tremors    [provider]  propranolol ER (INDERAL LA) 60 MG 24 hr capsule Take 60 mg by mouth daily.    [provider]  rasagiline (AZILECT) 0.5 MG TABS tablet TAKE TWO TABLETS BY MOUTH ONCE A DAY TO SLOW PROGRESSION OF PARKINSON'S DISEASE 08/08/21   [provider]  RELISTOR 150 MG TABS Take 3 tablets by mouth daily. 12/30/21   [provider]  risperiDONE (RISPERDAL) 0.5 MG tablet Take 1 tablet (0.5 mg total) by mouth at bedtime as needed for up to 30 days (for agitation). 04/27/19 01/17/22  Amin, Jeanella Flattery, MD  tamsulosin (FLOMAX) 0.4 MG CAPS capsule Take 0.4 mg by  mouth at bedtime.  Patient not taking: Reported on 09/18/2022    [provider]  Tapentadol HCl 100 MG TABS Take 150 mg by mouth 4 (four) times daily as needed (PAIN).  Patient not taking: Reported on 09/18/2022    [provider]  Tiotropium Bromide Monohydrate 2.5 MCG/ACT AERS Inhale 2 puffs into the lungs daily.    [provider]  trifluoperazine (STELAZINE) 1 MG tablet Take 3 mg by mouth at bedtime. Patient not taking: Reported on 09/18/2022    [provider]  valACYclovir (VALTREX) 1000 MG tablet Take 1 tablet (1,000 mg total) by mouth 3 (three) times daily. 01/11/23   Veryl Speak, MD  XTAMPZA ER 18 MG C12A Take 1 capsule by mouth 2 (two) times daily. Patient not taking: Reported on 09/18/2022 07/09/22   [provider]  Allergies    Patient has no known allergies.    Review of Systems   Review of Systems  Constitutional:  Negative for chills and fever.  Respiratory:  Negative for shortness of breath.   Cardiovascular:  Negative for chest pain.  Gastrointestinal:  Negative for abdominal pain.  Neurological:  Negative for headaches.  Psychiatric/Behavioral:  Positive for suicidal ideas.     Physical Exam Updated Vital Signs BP 118/80 (BP Location: Right Arm)   Pulse 72   Temp 98.7 F (37.1 C) (Oral)   Resp 14   SpO2 98%  Physical Exam Vitals and nursing note reviewed.  Constitutional:      General: He is not in acute distress.    Appearance: He is not ill-appearing.  HENT:     Head: Normocephalic and atraumatic.     Nose: No congestion.  Eyes:     Conjunctiva/sclera: Conjunctivae normal.  Cardiovascular:     Rate and Rhythm: Normal rate and regular rhythm.     Pulses: Normal pulses.     Heart sounds: No murmur heard.    No friction rub. No gallop.  Pulmonary:     Effort: No respiratory distress.     Breath sounds: No wheezing or rhonchi.  Abdominal:     Palpations: Abdomen is soft.     Tenderness: There is no  abdominal tenderness. There is no right CVA tenderness or left CVA tenderness.  Skin:    General: Skin is warm and dry.  Neurological:     Mental Status: He is alert.  Psychiatric:        Mood and Affect: Mood normal.     ED Results / Procedures / Treatments   Labs (all labs ordered are listed, but only abnormal results are displayed) Labs Reviewed  COMPREHENSIVE METABOLIC PANEL - Abnormal; Notable for the following components:      Result Value   Glucose, Bld 142 (*)    Total Bilirubin <0.1 (*)    All other components within normal limits  RAPID URINE DRUG SCREEN, HOSP PERFORMED - Abnormal; Notable for the following components:   Opiates POSITIVE (*)    Benzodiazepines POSITIVE (*)    Barbiturates POSITIVE (*)    All other components within normal limits  CBC WITH DIFFERENTIAL/PLATELET - Abnormal; Notable for the following components:   RDW 15.8 (*)    Abs Immature Granulocytes 0.09 (*)    All other components within normal limits  ETHANOL    EKG None  Radiology DG Ribs Unilateral W/Chest Left  Result Date: 01/30/2023 CLINICAL DATA:  Chest pain EXAM: LEFT RIBS AND CHEST - 3+ VIEW COMPARISON:  None Available. FINDINGS: Lungs are clear. No pneumothorax or pleural effusion. Tortuosity and ectasia of the thoracic aorta appears similar. Cardiac size within normal limits. Pulmonary vascularity is normal. No acute bone abnormality. Specifically, no left rib fracture or lytic lesion identified. IMPRESSION: 1. No left rib fracture or lytic lesion. 2. Tortuosity and ectasia of the thoracic aorta. Electronically Signed   By: Fidela Salisbury M.D.   On: 01/30/2023 02:35   CT Head Wo Contrast  Result Date: 01/30/2023 CLINICAL DATA:  Head trauma, minor (Age >= 65y); Neck trauma (Age >= 65y). Altered mental status EXAM: CT HEAD WITHOUT CONTRAST CT CERVICAL SPINE WITHOUT CONTRAST TECHNIQUE: Multidetector CT imaging of the head and cervical spine was performed following the standard protocol  without intravenous contrast. Multiplanar CT image reconstructions of the cervical spine were also generated. RADIATION DOSE REDUCTION: This exam was performed  according to the departmental dose-optimization program which includes automated exposure control, adjustment of the mA and/or kV according to patient size and/or use of iterative reconstruction technique. COMPARISON:  CT head 11/30/2022 FINDINGS: CT HEAD FINDINGS Brain: Normal anatomic configuration. Parenchymal volume loss is commensurate with the patient's age. Mild periventricular white matter changes are present likely reflecting the sequela of small vessel ischemia. No abnormal intra or extra-axial mass lesion or fluid collection. No abnormal mass effect or midline shift. No evidence of acute intracranial hemorrhage or infarct. Ventricular size is normal. Cerebellum unremarkable. Vascular: No asymmetric hyperdense vasculature at the skull base. Skull: Intact Sinuses/Orbits: Mild mucosal thickening within the right frontal sinus. No air-fluid level. Remaining paranasal sinuses are clear. Orbits are unremarkable. Other: Mastoid air cells and middle ear cavities are clear. CT CERVICAL SPINE FINDINGS Alignment: Normal. Skull base and vertebrae: Craniocervical alignment is normal. The atlantodental interval is not widened. 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 No acute fracture. No lytic or blastic bone lesion. Soft tissues and spinal canal: No prevertebral fluid or swelling. No visible canal hematoma. Hypertrophic spurring posteriorly at C5-6 results in mild central canal stenosis. No high-grade canal stenosis identified, however. Disc levels: Intervertebral disc space narrowing at C6-7 in keeping with changes of moderate degenerative disc disease. Prevertebral soft tissues are not thickened on sagittal reformats. There is multilevel  arthrosis and hypertrophic spurring resulting in multilevel moderate to severe neuroforaminal narrowing, most severe on the left at C3-4 and C4-5, and bilaterally at C5-6 and C6-7. Upper chest: Mild emphysema Other: None IMPRESSION: 1. No acute intracranial abnormality. No calvarial fracture. 2. No acute fracture or listhesis of the cervical spine. 3. 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 4. Multilevel degenerative disc and degenerative joint disease resulting in multilevel moderate to severe neuroforaminal narrowing, most severe on the left at C3-4 and C4-5, and bilaterally at C5-6 and C6-7. Electronically Signed   By: Fidela Salisbury M.D.   On: 01/30/2023 02:22   CT Cervical Spine Wo Contrast  Result Date: 01/30/2023 CLINICAL DATA:  Head trauma, minor (Age >= 65y); Neck trauma (Age >= 65y). Altered mental status EXAM: CT HEAD WITHOUT CONTRAST CT CERVICAL SPINE WITHOUT CONTRAST TECHNIQUE: Multidetector CT imaging of the head and cervical spine was performed following the standard protocol without intravenous contrast. Multiplanar CT image reconstructions of the cervical spine were also generated. RADIATION DOSE REDUCTION: This exam was performed according to the departmental dose-optimization program which includes automated exposure control, adjustment of the mA and/or kV according to patient size and/or use of iterative reconstruction technique. COMPARISON:  CT head 11/30/2022 FINDINGS: CT HEAD FINDINGS Brain: Normal anatomic configuration. Parenchymal volume loss is commensurate with the patient's age. Mild periventricular white matter changes are present likely reflecting the sequela of small vessel ischemia. No abnormal intra or extra-axial mass lesion or fluid collection. No abnormal mass effect or midline shift. No evidence of acute intracranial hemorrhage or infarct. Ventricular size is normal.  Cerebellum unremarkable. Vascular: No asymmetric hyperdense vasculature at the skull base. Skull: Intact Sinuses/Orbits: Mild mucosal thickening within the right frontal sinus. No air-fluid level. Remaining paranasal sinuses are clear. Orbits are unremarkable. Other: Mastoid air cells and middle ear cavities are clear. CT CERVICAL SPINE FINDINGS Alignment: Normal. Skull base and vertebrae: Craniocervical alignment is normal. The atlantodental interval is  not widened. 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 No acute fracture. No lytic or blastic bone lesion. Soft tissues and spinal canal: No prevertebral fluid or swelling. No visible canal hematoma. Hypertrophic spurring posteriorly at C5-6 results in mild central canal stenosis. No high-grade canal stenosis identified, however. Disc levels: Intervertebral disc space narrowing at C6-7 in keeping with changes of moderate degenerative disc disease. Prevertebral soft tissues are not thickened on sagittal reformats. There is multilevel arthrosis and hypertrophic spurring resulting in multilevel moderate to severe neuroforaminal narrowing, most severe on the left at C3-4 and C4-5, and bilaterally at C5-6 and C6-7. Upper chest: Mild emphysema Other: None IMPRESSION: 1. No acute intracranial abnormality. No calvarial fracture. 2. No acute fracture or listhesis of the cervical spine. 3. 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 4. Multilevel degenerative disc and degenerative joint disease resulting in multilevel moderate to severe neuroforaminal narrowing, most severe on the left at C3-4 and C4-5, and bilaterally at C5-6 and C6-7. Electronically Signed   By: Fidela Salisbury M.D.   On: 01/30/2023 02:22    Procedures .Critical Care  Performed by:  Marcello Fennel, PA-C Authorized by: Marcello Fennel, PA-C   Critical care provider statement:    Critical care time (minutes):  30   Critical care time was exclusive of:  Separately billable procedures and treating other patients   Critical care was necessary to treat or prevent imminent or life-threatening deterioration of the following conditions: Psychiatric emergency becoming belligerent and placing both staff and himself in danger.  Had to be physically restrained did and continue monitoring.   Critical care was time spent personally by me on the following activities:  Discussions with consultants, evaluation of patient's response to treatment, examination of patient, ordering and review of laboratory studies, ordering and review of radiographic studies, ordering and performing treatments and interventions, pulse oximetry, re-evaluation of patient's condition and review of old charts   I assumed direction of critical care for this patient from another provider in my specialty: no       Medications Ordered in ED Medications  midazolam (VERSED) injection 5 mg (5 mg Intramuscular Given 01/29/23 2320)  ziprasidone (GEODON) injection 20 mg (20 mg Intramuscular Given 01/30/23 0103)  sterile water (preservative free) injection (  Given 01/30/23 0102)  oxyCODONE-acetaminophen (PERCOCET/ROXICET) 5-325 MG per tablet 2 tablet (2 tablets Oral Given 01/30/23 0409)    ED Course/ Medical Decision Making/ A&P                             Medical Decision Making Amount and/or Complexity of Data Reviewed Labs: ordered. Radiology: ordered.  Risk Prescription drug management.   This patient presents to the ED for concern of suicidal ideations, this involves an extensive number of treatment options, and is a complaint that carries with it a high risk of complications and morbidity.  The differential diagnosis includes metabolic derailment, withdrawals, psychiatric emergency    Additional  history obtained:  Additional history obtained from police External records from outside source obtained and reviewed including recent ER notes   Co morbidities that complicate the patient evaluation  Psychiatric disorder  Social Determinants of Health:  Geriatric    Lab Tests:  I Ordered, and personally interpreted labs.  The  pertinent results include: CBC is unremarkable, CMP shows glucose 142, ethanol less than 10, rapid drug seen positive for opiates benzodiazepine as well as barbiturates   Imaging Studies ordered:  I ordered imaging studies including CT head, C-spine, x-ray of the left ribs I independently visualized and interpreted imaging which showed all imaging are negative for acute findings. I agree with the radiologist interpretation   Cardiac Monitoring:  The patient was maintained on a cardiac monitor.  I personally viewed and interpreted the cardiac monitored which showed an underlying rhythm of: EKG without signs of ischemia   Medicines ordered and prescription drug management:  I ordered medication including Versed I have reviewed the patients home medicines and have made adjustments as needed  Critical Interventions:  Patient became belligerent again, and he became immediate danger to staff as well as himself, will restrain patient and provide him with Geodon Patient has been restrained, he is resting more comfortably, he endorses his head hurts as well as his neck hurts as well as some left rib pain, he was tender along his ribs and his neck, will obtain imaging for rule out possible injury at this time   Reevaluation:  Notified by nursing that patient became belligerent, he was unable to be redirected, he was refusing all lab work threatening staff, will provide him with 5 IM of Versed  I reassessed the patient, he states he is resting more comfortably, he states that he was brought here because he became very aggravated at the [place] as well as his  dental office and his wife was concerned as he has been getting more angry and she is concerned that he might hurt someone.  He is not endorsing any suicidal homicidal at this time, does not appear to respond to internal stimuli  Patient presents back from imaging he is now asleep, and does not pose immediate danger at this time but due to his aggressive behavior will discontinue the violent restraints and placed him in nonviolent restraints.  Patient currently has no restraints at this time he is resting comfortably, he is medically clear at this time will await TTS recommendations.    Consultations Obtained:  TTS consultation pending    Test Considered:  N/A    Rule out Suspicion for cranial mass is low patient had recent CT imaging which was negative for these findings.  He has no history of psychiatric disorder which is the more likely reason current suicidal ideation as well as angry outburst.  It is noted that patient has a torturous aorta seen on his chest x-ray, I doubt dissection at this time, this was obtained to further assess his left-sided rib pain, he currently has none at this time.  I doubt metabolic derailment as lab work is all unremarkable.    Dispostion and problem list  After consideration of the diagnostic results and the patients response to treatment, I feel that the patent would benefit from place in psych hold, currently under IVC, home meds have been ordered, will await further recommendations from TTS.Marland Kitchen             Final Clinical Impression(s) / ED Diagnoses Final diagnoses:  Suicidal ideation  Aggressive behavior    Rx / DC Orders ED Discharge Orders     None         Marcello Fennel, PA-C 01/30/23 0603    Maudie Flakes, MD 01/30/23 [phone]

## 2023-01-29 NOTE — ED Notes (Signed)
[employer] still at bedside. Pt is refusing to get dressed into purple scrubs and blood draws. Pt is very adamant about wanting to leave the ED. However, I and the officers explained to the patient that refusing to cooperate will only prolong his visit. Pt still refusing any work to be done.

## 2023-01-29 NOTE — ED Triage Notes (Signed)
Pt arrived with [employer]. IVC paperwork in process. Pt friends called 911 due to reckless driving and SI statements  On arrival to ED pt repeatedly telling staff that he will fight them and that he wants to leave. Pt informed that he has been placed on a legal hold and needs to be assessed by the psych team prior to being aloud to leave

## 2023-01-29 NOTE — ED Notes (Signed)
Pt refused EKG at this time. Pt stated, "dont come in here, get out EMT" RN Notified.

## 2023-01-30 ENCOUNTER — Emergency Department (HOSPITAL_COMMUNITY): Payer: No Typology Code available for payment source

## 2023-01-30 DIAGNOSIS — R4689 Other symptoms and signs involving appearance and behavior: Secondary | ICD-10-CM | POA: Insufficient documentation

## 2023-01-30 DIAGNOSIS — R45851 Suicidal ideations: Secondary | ICD-10-CM

## 2023-01-30 LAB — RAPID URINE DRUG SCREEN, HOSP PERFORMED
Amphetamines: NOT DETECTED
Barbiturates: POSITIVE — AB
Benzodiazepines: POSITIVE — AB
Cocaine: NOT DETECTED
Opiates: POSITIVE — AB
Tetrahydrocannabinol: NOT DETECTED

## 2023-01-30 MED ORDER — ATORVASTATIN CALCIUM 10 MG PO TABS: 20.0000 mg | ORAL_TABLET | Freq: Every day | ORAL | Status: DC

## 2023-01-30 MED ORDER — STERILE WATER FOR INJECTION IJ SOLN
INTRAMUSCULAR | Status: AC
  Filled 2023-01-30: qty 10

## 2023-01-30 MED ORDER — RISPERIDONE 0.5 MG PO TABS: 0.5000 mg | ORAL_TABLET | Freq: Every evening | ORAL | Status: DC | PRN

## 2023-01-30 MED ORDER — GABAPENTIN 300 MG PO CAPS
300.0000 mg | ORAL_CAPSULE | Freq: Every day | ORAL | Status: DC
  Administered 2023-01-30: 300 mg via ORAL
  Filled 2023-01-30: qty 1

## 2023-01-30 MED ORDER — OXYCODONE-ACETAMINOPHEN 5-325 MG PO TABS
2.0000 | ORAL_TABLET | Freq: Once | ORAL | Status: AC
  Administered 2023-01-30: 2 via ORAL
  Filled 2023-01-30: qty 2

## 2023-01-30 MED ORDER — ZIPRASIDONE MESYLATE 20 MG IM SOLR
20.0000 mg | Freq: Once | INTRAMUSCULAR | Status: AC
  Administered 2023-01-30: 20 mg via INTRAMUSCULAR
  Filled 2023-01-30: qty 20

## 2023-01-30 MED ORDER — PANTOPRAZOLE SODIUM 40 MG PO TBEC
40.0000 mg | DELAYED_RELEASE_TABLET | Freq: Every day | ORAL | Status: DC
  Administered 2023-01-30: 40 mg via ORAL
  Filled 2023-01-30: qty 1

## 2023-01-30 NOTE — ED Notes (Signed)
Patient changed back into regular clothes and wife at bedside.

## 2023-01-30 NOTE — Consult Note (Signed)
Alan Brown Psychiatry Consult   Reason for Consult:  Psych consult Referring Physician:  Marcello Fennel, PA-C   Patient Identification: Alan Brown MRN:  [id number] Principal Diagnosis: Suicidal ideation Diagnosis:  Principal Problem:   Suicidal ideation Active Problems:   Aggression   Total Time spent with patient: 45 minutes  Subjective:   Alan Brown is a 72 y.o. male patient admitted to [facility] on IVC due to patient making suicidal statements and reckless driving.  HPI: Alan Brown is a 72 year old male patient with a history of polysubstance abuse, chronic pain syndrome, bipolar disorder, who was brought to [facility] on IVC due to making suicidal statements and reckless driving.   Patient was seen face to face by this provider, chart reviewed and consulted with Dr Dwyane Dee 01/30/23.   Per IVC by [place] - Crisis counselor Respondent told crisis counselor tonight he is using his medication to numb himself up to off himself. Relapsed tonight after 29 years. Told family tonight "I might as well kill myself". Has access to gun. LEO saw gun in respondent's car and will not let him inside the car at this time.  On evaluation patient is alert and oriented x3, speech is clear and coherent. Patient's eye contact is good, mood is euthymic, affect is appropriate. Patient's thought process is goal directed and thought content is within normal limits. Patient denies SI, HI, denies AVH, or paranoia. There is no indication that the patient is responding to internal stimuli and no delusion noted.    Patient stated "I am a disabled veteran and I have Parkinson disease". Patient reported that he is being seen at the [facility] and the doctor wanted hime to have an implant for his teeth but the [facility] will not pay for it . Patient reported that he does not have any bottom teeth and he has been eating only yogurt and soft food which is not healthy for him. He reported  using denture prior to that but he has been chocking on the denture so the doctor suggested that he has an implant which will cost up to $25,000. Patient says the [facility] will not pay for it, they want him to pay out-of-pocket. Patient reported that he was angry about that and he was not sure why he made a suicidal statement to his wife. He said he got out to [employer] to get ibuprofen but bunch of police showed up and brought him here to the hospital. Patient also reported buying vodka on his way but he said he did not drink it because he called his sponsor who spoke to him, he says the vodka is still in his car.  He said he has been sober for 29 years and has not used drugs or alcohol. Patient said he was in the [facility] program and he has a sponsor assigned to him. Patient reported taking his medication daily and he sees a psychiatrist and therapist at the [facility].  Patient reported that he has a shotgun that he takes with him wherever he goes because he is a veteran and he has to have his gun with him always. He reported that he has a big safe that he keeps is gun safely away from the children.  Additional information obtained from the wife Alan Brown with the permission of the patient. Wife reported that he was angry about the news he got from the [facility] and his anger escalated from there. Wife said he made the statement  that he is just going to kill himself and he later went out in his trunk, telling the family that he is going to clear his head. Wife says she become scared and she had to call the police because she knows he has a gun on him. Wife says he has anger issues but he sees a therapist and psychiatrist at the [place] once in a month. Wife says he gets angry about a lot of things and he needs to get his rage under control.  Wife says he does not have any history of suicide attempts and no prior history of psych hospitalization. Wife says he also needs to lock up his guns and not take it with him all  the time. Wife denies any safety concerns about patient returning home. Wife says she can pick patient up at 12pm today.  Ritchie was educated extensively on the safety of his guns, and to lock them up all the time. Patient stated "I am a disabled veteran I have to have my gun always but I have a big safe where I keep them away from everybody locked with a code".  Patient verbalized understanding of all teachings.    Support, encouragement and reassurance provided about ongoing stressors and patient provided with opportunity for questions.     Patient does not meet inpatient psychiatry admission criteria and there is no evidence of imminent danger to self or others.    Patient will be discharged home.  Past Psychiatric History: Chronic pain syndrome, polysubstance, bipolar disorder  Risk to Self: No  Risk to Others: No  Prior Inpatient Therapy: No   Prior Outpatient Therapy: Yes   Past Medical History:  Past Medical History:  Diagnosis Date   Anxiety    Arthritis    "all through my body" (01/26/2014)   Chronic lower back pain    Depression    Depression    GERD (gastroesophageal reflux disease)    OCD (obsessive compulsive disorder)    Parkinson's disease    Pneumonia 1952; 11/2013   Prolapse, disk    lower back   Stroke [facility])    pt reported TIA    Past Surgical History:  Procedure Laterality Date   APPENDECTOMY  12/16/1959   COLONOSCOPY     INGUINAL HERNIA REPAIR Left 12/15/1996   SHOULDER ARTHROSCOPY WITH ROTATOR CUFF REPAIR AND SUBACROMIAL DECOMPRESSION Right 06/29/2019   Procedure: RIGHT SHOULDER ARTHROSCOPY WITH ROTATOR CUFF REPAIR, BICEPS TENO SUBACROMIAL DECOMPRESSION, DISTAL CLAVICLE EXCISION, EXTENSIVE DEBRIDEMENT;  Surgeon: Leandrew Koyanagi, MD;  Location: [place];  Service: Orthopedics;  Laterality: Right;   TONSILLECTOMY  12/15/1974   Family History:  Family History  Problem Relation Age of Onset   Hypertension Mother    Hypertension Father     Aneurysm Father    Diabetes Sister    Other Sister        killed   Heart disease Sister    Colon cancer Neg Hx    Esophageal cancer Neg Hx    Pancreatic cancer Neg Hx    Stomach cancer Neg Hx    Family Psychiatric  History: Not provided Social History:  Social History   Substance and Sexual Activity  Alcohol Use No   Comment:  "quit drinking 25 yr ago; I'm a recovering alcoholic"     Social History   Substance and Sexual Activity  Drug Use No    Social History   Socioeconomic History   Marital status: Married    Spouse  name: Not on file   Number of children: Not on file   Years of education: Not on file   Highest education level: Not on file  Occupational History   Occupation: disabled veteran  Tobacco Use   Smoking status: Former    Packs/day: 1.00    Years: 25.00    Total pack years: 25.00    Types: Cigarettes   Smokeless tobacco: Never   Tobacco comments:    01/26/2014 "quit smoking 80yr ago"  Vaping Use   Vaping Use: Never used  Substance and Sexual Activity   Alcohol use: No    Comment:  "quit drinking 25 yr ago; I'm a recovering alcoholic"   Drug use: No   Sexual activity: Not Currently  Other Topics Concern   Not on file  Social History Narrative   Not on file   Social Determinants of Health   Financial Resource Strain: Not on file  Food Insecurity: Not on file  Transportation Needs: Not on file  Physical Activity: Not on file  Stress: Not on file  Social Connections: Not on file   Additional Social History:    Allergies:  No Known Allergies  Labs:  Results for orders placed or performed during the hospital encounter of 01/29/23 (from the past 48 hour(s))  Comprehensive metabolic panel     Status: Abnormal   Collection Time: 01/29/23 10:05 PM  Result Value Ref Range   Sodium 138 135 - 145 mmol/L   Potassium 3.9 3.5 - 5.1 mmol/L   Chloride 100 98 - 111 mmol/L   CO2 27 22 - 32 mmol/L   Glucose, Bld 142 (H) 70 - 99 mg/dL    Comment:  Glucose reference range applies only to samples taken after fasting for at least 8 hours.   BUN 21 8 - 23 mg/dL   Creatinine, Ser 1.10 0.61 - 1.24 mg/dL   Calcium 9.4 8.9 - 10.3 mg/dL   Total Protein 7.4 6.5 - 8.1 g/dL   Albumin 3.8 3.5 - 5.0 g/dL   AST 19 15 - 41 U/L   ALT 11 0 - 44 U/L   Alkaline Phosphatase 75 38 - 126 U/L   Total Bilirubin <0.1 (L) 0.3 - 1.2 mg/dL   GFR, Estimated >60 >60 mL/min    Comment: (NOTE) Calculated using the CKD-EPI Creatinine Equation (2021)    Anion gap 11 5 - 15    Comment: Performed at [id number], [place] [address] Ethanol     Status: None   Collection Time: 01/29/23 10:05 PM  Result Value Ref Range   Alcohol, Ethyl (B) <10 <10 mg/dL    Comment: (NOTE) Lowest detectable limit for serum alcohol is 10 mg/dL.  For medical purposes only. Performed at [facility] [address], [place] [address]  CBC with Diff     Status: Abnormal   Collection Time: 01/29/23 10:05 PM  Result Value Ref Range   WBC 9.3 4.0 - 10.5 K/uL   RBC 4.65 4.22 - 5.81 MIL/uL   Hemoglobin 13.8 13.0 - 17.0 g/dL   HCT 42.5 39.0 - 52.0 %   MCV 91.4 80.0 - 100.0 fL   MCH 29.7 26.0 - 34.0 pg   MCHC 32.5 30.0 - 36.0 g/dL   RDW 15.8 (H) 11.5 - 15.5 %   Platelets 227 150 - 400 K/uL   nRBC 0.0 0.0 - 0.2 %   Neutrophils Relative % 58 %  Neutro Abs 5.4 1.7 - 7.7 K/uL   Lymphocytes Relative 29 %   Lymphs Abs 2.7 0.7 - 4.0 K/uL   Monocytes Relative 7 %   Monocytes Absolute 0.7 0.1 - 1.0 K/uL   Eosinophils Relative 4 %   Eosinophils Absolute 0.4 0.0 - 0.5 K/uL   Basophils Relative 1 %   Basophils Absolute 0.1 0.0 - 0.1 K/uL   Immature Granulocytes 1 %   Abs Immature Granulocytes 0.09 (H) 0.00 - 0.07 K/uL    Comment: Performed at [facility] [address], [place], [place] [address]  Urine rapid drug screen (hosp performed)     Status: Abnormal   Collection Time: 01/30/23 12:08 AM  Result Value Ref Range   Opiates  POSITIVE (A) NONE DETECTED   Cocaine NONE DETECTED NONE DETECTED   Benzodiazepines POSITIVE (A) NONE DETECTED   Amphetamines NONE DETECTED NONE DETECTED   Tetrahydrocannabinol NONE DETECTED NONE DETECTED   Barbiturates POSITIVE (A) NONE DETECTED    Comment: (NOTE) DRUG SCREEN FOR MEDICAL PURPOSES ONLY.  IF CONFIRMATION IS NEEDED FOR ANY PURPOSE, NOTIFY LAB WITHIN 5 DAYS.  LOWEST DETECTABLE LIMITS FOR URINE DRUG SCREEN Drug Class                     Cutoff (ng/mL) Amphetamine and metabolites    1000 Barbiturate and metabolites    200 Benzodiazepine                 200 Opiates and metabolites        300 Cocaine and metabolites        300 THC                            50 Performed at [facility], [place] [address] [place], [place] [address]     Current Facility-Administered Medications  Medication Dose Route Frequency Provider Last Rate Last Admin   atorvastatin (LIPITOR) tablet 20 mg  20 mg Oral QHS Marcello Fennel, PA-C       gabapentin (NEURONTIN) capsule 300 mg  300 mg Oral Daily Marcello Fennel, PA-C   300 mg at 01/30/23 [id number]   pantoprazole (PROTONIX) EC tablet 40 mg  40 mg Oral Daily Marcello Fennel, PA-C   40 mg at 01/30/23 [id number]   risperiDONE (RISPERDAL) tablet 0.5 mg  0.5 mg Oral QHS PRN Marcello Fennel, PA-C       Current Outpatient Medications  Medication Sig Dispense Refill   albuterol (VENTOLIN HFA) 108 (90 Base) MCG/ACT inhaler Inhale 2 puffs into the lungs 4 (four) times daily as needed for wheezing or shortness of breath.      aspirin EC 81 MG tablet Take 81 mg by mouth daily.     budesonide-formoterol (SYMBICORT) 80-4.5 MCG/ACT inhaler Inhale 2 puffs into the lungs 2 (two) times daily.     busPIRone (BUSPAR) 15 MG tablet Take 15 mg by mouth 2 (two) times daily.     Calcium Carbonate-Vitamin D 600-400 MG-UNIT tablet Take 1 tablet by mouth 2 (two) times daily.     carbidopa-levodopa (SINEMET IR) 25-100 MG tablet Take 1 tablet by mouth 3 (three)  times daily. Take 1 tablet at 8am/1pm/5pm     ferrous sulfate 325 (65 FE) MG tablet Take 325 mg by mouth every Monday, Wednesday, and Friday.     hydrOXYzine (ATARAX) 25 MG tablet Take 25 mg by mouth daily as needed for  anxiety.     memantine (NAMENDA) 10 MG tablet Take 20 mg by mouth at bedtime.     methocarbamol (ROBAXIN) 500 MG tablet Take 1 tablet (500 mg total) by mouth every 8 (eight) hours as needed for muscle spasms. 30 tablet 0   morphine (MS CONTIN) 15 MG 12 hr tablet Take 1 tablet by mouth in the morning and at bedtime.     oxyCODONE-acetaminophen (PERCOCET/ROXICET) 5-325 MG tablet Take 2 tablets by mouth 2 (two) times daily.     pregabalin (LYRICA) 300 MG capsule Take 300 mg by mouth 2 (two) times daily.     primidone (MYSOLINE) 50 MG tablet Take 50 mg by mouth 3 (three) times daily. For tremors     silver sulfADIAZINE (SILVADENE) 1 % cream Apply 1 Application topically 2 (two) times daily.     acetaminophen (TYLENOL) 325 MG tablet      ARIPiprazole (ABILIFY) 5 MG tablet Take 5 mg by mouth at bedtime. (Patient not taking: Reported on 09/18/2022)     atorvastatin (LIPITOR) 20 MG tablet Take 20 mg by mouth at bedtime.      Calcium Carb-Cholecalciferol 600-10 MG-MCG TABS Take 1 tablet by mouth 2 (two) times daily. (Patient not taking: Reported on 09/18/2022)     calcium carbonate (OSCAL) 1500 (600 Ca) MG TABS tablet Take by mouth. (Patient not taking: Reported on 09/18/2022)     cyclobenzaprine (FLEXERIL) 10 MG tablet Take 10 mg by mouth 3 (three) times daily as needed. (Patient not taking: Reported on 09/18/2022)     diphenhydrAMINE (BENADRYL) 25 MG tablet Take 1 tablet (25 mg total) by mouth every 8 (eight) hours as needed. 30 tablet 0   gabapentin (NEURONTIN) 300 MG capsule Take by mouth.     ibuprofen (ADVIL) 800 MG tablet Take 1 tablet (800 mg total) by mouth 3 (three) times daily. 21 tablet 0   JARDIANCE 10 MG TABS tablet Take 10 mg by mouth daily. (Patient not taking: Reported on  09/18/2022)     lidocaine (LIDODERM) 5 % Place 1 patch onto the skin daily. Remove & Discard patch within 12 hours or as directed by MD (Patient not taking: Reported on 09/18/2022) 5 patch 0   metoCLOPramide (REGLAN) 10 MG tablet TAKE 1 TABLET BY MOUTH IN THE MORNING AND AT BEDTIME (Patient not taking: Reported on 09/18/2022) 60 tablet 3   mirtazapine (REMERON) 30 MG tablet Take 30 mg by mouth at bedtime. (Patient not taking: Reported on 09/18/2022)     Multiple Vitamin (MULTIVITAMIN WITH MINERALS) TABS tablet Take 1 tablet by mouth daily. (Patient not taking: Reported on 09/18/2022)     mupirocin ointment (BACTROBAN) 2 % SMARTSIG:Sparingly Topical 2-3 Times Daily PRN (Patient not taking: Reported on 09/18/2022)     omeprazole (PRILOSEC) 40 MG capsule Take 40 mg by mouth 2 (two) times daily before a meal.     Oxycodone HCl 20 MG TABS Take 1 tablet by mouth 5 (five) times daily.     propranolol ER (INDERAL LA) 60 MG 24 hr capsule Take 60 mg by mouth daily.     rasagiline (AZILECT) 0.5 MG TABS tablet TAKE TWO TABLETS BY MOUTH ONCE A DAY TO SLOW PROGRESSION OF PARKINSON'S DISEASE     RELISTOR 150 MG TABS Take 3 tablets by mouth daily.     risperiDONE (RISPERDAL) 0.5 MG tablet Take 1 tablet (0.5 mg total) by mouth at bedtime as needed for up to 30 days (for agitation). 30 tablet 0   tamsulosin (FLOMAX)  0.4 MG CAPS capsule Take 0.4 mg by mouth at bedtime.  (Patient not taking: Reported on 09/18/2022)     Tapentadol HCl 100 MG TABS Take 150 mg by mouth 4 (four) times daily as needed (PAIN).  (Patient not taking: Reported on 09/18/2022)     Tiotropium Bromide Monohydrate 2.5 MCG/ACT AERS Inhale 2 puffs into the lungs daily.     trifluoperazine (STELAZINE) 1 MG tablet Take 3 mg by mouth at bedtime. (Patient not taking: Reported on 09/18/2022)     valACYclovir (VALTREX) 1000 MG tablet Take 1 tablet (1,000 mg total) by mouth 3 (three) times daily. 21 tablet 0   XTAMPZA ER 18 MG C12A Take 1 capsule by mouth 2 (two)  times daily. (Patient not taking: Reported on 09/18/2022)      Musculoskeletal: Strength & Muscle Tone: within normal limits Gait & Station: normal Patient leans: N/A  Psychiatric Specialty Exam:  Presentation  General Appearance:  Appropriate for Environment  Eye Contact: Good  Speech: Clear and Coherent  Speech Volume: Normal  Handedness: Right   Mood and Affect  Mood: Euthymic  Affect: Appropriate   Thought Process  Thought Processes: Coherent  Descriptions of Associations:Intact  Orientation:Full (Time, Place and Person)  Thought Content:WDL  History of Schizophrenia/Schizoaffective disorder:No data recorded Duration of Psychotic Symptoms:No data recorded Hallucinations:Hallucinations: None  Ideas of Reference:None  Suicidal Thoughts:Suicidal Thoughts: No  Homicidal Thoughts:Homicidal Thoughts: No   Sensorium  Memory: Immediate Good; Recent Good; Remote Good  Judgment: Good  Insight: Good   Executive Functions  Concentration: Good  Attention Span: Good  Recall: Good  Fund of Knowledge: Good  Language: Good   Psychomotor Activity  Psychomotor Activity: Psychomotor Activity: Normal   Assets  Assets: Communication Skills; Social Support; Housing   Sleep  Sleep: Sleep: Fair   Physical Exam: Physical Exam Vitals and nursing note reviewed.  Eyes:     General:        Right eye: No discharge.        Left eye: No discharge.  Cardiovascular:     Pulses: Normal pulses.  Pulmonary:     Effort: No respiratory distress.     Breath sounds: No wheezing.  Neurological:     Mental Status: He is alert and oriented to person, place, and time.     Motor: No weakness.  Psychiatric:        Attention and Perception: Attention normal. He does not perceive auditory or visual hallucinations.        Mood and Affect: Mood normal.        Speech: Speech normal.        Behavior: Behavior normal. Behavior is cooperative.         Thought Content: Thought content normal. Thought content is not delusional. Thought content does not include homicidal or suicidal ideation. Thought content does not include homicidal or suicidal plan.        Cognition and Memory: Cognition normal.        Judgment: Judgment normal.    Review of Systems  Constitutional:  Negative for fever.  HENT:  Negative for ear discharge and hearing loss.   Eyes:  Negative for discharge and redness.  Respiratory:  Negative for shortness of breath and wheezing.   Cardiovascular:  Negative for chest pain and palpitations.  Gastrointestinal:  Negative for abdominal pain, heartburn and nausea.  Neurological:  Positive for headaches. Negative for dizziness, seizures and weakness.  Psychiatric/Behavioral:  Negative for depression, hallucinations, substance abuse and suicidal ideas.  Blood pressure 118/80, pulse 72, temperature 98.7 F (37.1 C), temperature source Oral, resp. rate 14, SpO2 98 %. There is no height or weight on file to calculate BMI.  Treatment Plan Summary: Plan Patient will be discharged home. Patient will be educated on safety, lock up his guns away from the reach of children at home. Patient will be given resources on anger management and outpatient psychiatry treatment and therapist.   Disposition: No evidence of imminent risk to self or others at present.   Patient does not meet criteria for psychiatric inpatient admission. Discussed crisis plan, support from social network, calling 911, coming to the Emergency Department, and calling Suicide Hotline.  Patient will be discharged home.   Discussed methods to reduce the risk of self-injury or suicide attempts: Frequent conversations regarding unsafe thoughts. Remove all significant sharps. Remove all firearms. Remove all medications, including over-the-counter meds. Consider lockbox for medications and having a responsible person dispense medications until patient has strengthened coping  skills. Room checks for sharps or other harmful objects. Secure all chemical substances that can be ingested or inhaled.   Please refrain from using alcohol or illicit substances, as they can affect your mood and can cause depression, anxiety or other concerning symptoms.  Alcohol can increase the chance that a person will make reckless decisions, like attempting suicide, and can increase the lethality of a drug overdose.      Discussed crisis plan, calling 911, or going to the ED if condition changes or worsens.  Patient verbalized her understanding.    Earney Mallet, NP 01/30/2023 10:37 AM
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
## 2023-01-30 NOTE — Discharge Instructions (Signed)
Discharge recommendations:  Patient is to take medications as prescribed. Please see information for follow-up appointment with psychiatry and therapy. Please follow up with your primary care provider for all medical related needs.   Therapy: We recommend that patient participate in individual therapy to address mental health concerns.  Medications: The parent/guardian is to contact a medical professional and/or outpatient provider to address any new side effects that develop. Parent/guardian should update outpatient providers of any new medications and/or medication changes.   Atypical antipsychotics: If you are prescribed an atypical antipsychotic, it is recommended that your height, weight, BMI, blood pressure, fasting lipid panel, and fasting blood sugar be monitored by your outpatient providers.  Safety:  The patient should abstain from use of illicit substances/drugs and abuse of any medications. If symptoms worsen or do not continue to improve or if the patient becomes actively suicidal or homicidal then it is recommended that the patient return to the closest hospital emergency department, the [facility], or call 911 for further evaluation and treatment. National Suicide Prevention Lifeline 1-800-SUICIDE or (203) 375-5385.  About 988 988 offers 24/7 access to trained crisis counselors who can help people experiencing mental health-related distress. People can call or text 988 or chat 988lifeline.org for themselves or if they are worried about a loved one who may need crisis support.  Crisis Mobile: [facility]:                     702-485-7742 (for crisis response 24 hours a day) [place]:                                            [phone]

## 2023-02-02 ENCOUNTER — Other Ambulatory Visit: Payer: Self-pay | Admitting: Family Medicine

## 2023-02-02 ENCOUNTER — Ambulatory Visit
Admission: RE | Admit: 2023-02-02 | Discharge: 2023-02-02 | Disposition: A | Discharge status: Home / Self Care | Payer: Medicare PPO | Source: Ambulatory Visit | Attending: Family Medicine | Admitting: Family Medicine

## 2023-02-02 DIAGNOSIS — R0789 Other chest pain: Secondary | ICD-10-CM

## 2023-02-12 ENCOUNTER — Other Ambulatory Visit: Payer: Self-pay | Admitting: Family Medicine

## 2023-02-12 DIAGNOSIS — I77819 Aortic ectasia, unspecified site: Secondary | ICD-10-CM

## 2023-02-12 NOTE — Progress Notes (Signed)
Assessment/Plan:   1.  Parkinsonism             -he isn't parkinsonian today.  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  Today, I had noted aripiprazole on his medication list, but he notes that he is no longer on it and does not know how long ago he was taking it.  I am not sure if the diagnosis of parkinsonism or Parkinson's disease occurred when on these medicines.  It certainly would be valuable to be able to see him off of levodopa altogether to see if he actually has Parkinson's disease. -he wants to decrease levodopa and I really have no objection given how he looks today.  He will take carbidopa/levodopa, 2 tablets three times per day as opposed to carbidopa/levodopa 25/100, 3 tablets 3 times per day.  He is not sure why the [place] neurologist increased his medication.  He noticed no benefit to increasing it, but wife and psychiatrist thought maybe it was causing behavioral issues.             -Patient is going to follow-up with his [place] neurologist.  He would like to follow-up with me on an as-needed basis.    2.  Chronic pain syndrome             -pt is on a significant amount of narcotic medication, which should be minimalized in patients of this age group, but especially in patients who have Parkinson's disease.  In addition, he complains about memory change and 1 certainly cannot rule out a neurodegenerative memory process in one on this number of opioids.   3.  Dysphagia  -issues started when bottom teeth were pulled, so I am not really sure that this is not purely a mechanical issue.  Once he gets the food "gummed up" (his words), he has no trouble.  I told him we would do a modified barium swallow, but ultimately the real issue is that he needs dentures that fit.  In the meantime, he may need to work with the speech/swallow therapist to help him.  [facility]  Subjective:   Alan Brown was  seen today in follow up at the request of the [facility] for evaluation for DBS for Parkinson's disease.  I have not seen the patient I have not seen the patient in 2-1/2 years.  I saw the patient 1 time in the year 2019 and 1 time in 2021.  When I saw the patient in 2021, he did look a bit parkinsonian, with mild rest tremor and very mild bradykinesia.  However, I saw him on levodopa.  He wanted an evaluation for DBS, and I told him that I would not recommend that given mild symptoms and how early he was on in the disease process.  The patient wanted to follow-up from there with his neurologist at the [place].  He is referred back by the [facility] for "new onset movement disorder feet, possibly dystonia, and also recent onset dysphagia."  Medical records from his neurologist are not sent.  He is on a significant amount of narcotic pain medication, receiving 116 tablets of oxycodone/acetaminophen 10/325 permonth in addition to 58 tablets of 15 mg morphine sulfate.  Despite that, he has been in the ER several times with complaints of pain.    Pt reports that the toes on the L feel like they are  curling but they are not curling when you look at them.  It doesn't come and go.  Its "all the time."  "It hurts and everything hurts."  He saw a podiatrist but isn't sure they talked about that.  They discussed nerve pain in the feet.  Having trouble swallowing.  Has no teeth on the bottom as had them pulled over the past year.  He has trouble eating things like "captain crunch" because of that.  He is now "gumming it" until he can swallow.  They made him dentures but he wasn't able to tolerate them - "I would choke with them."  He has no wearing on or off phenomenon.  His last fall was about 2 months ago, falling going down steps.  Didn't get hurt.    He states that his psychiatrist at the [place] told him his levodopa could be a "culprit" to his behaviors.  He was on abilify but doesn't know how long he was on  it or how long he was off of it.     Current movement disorder medications: Carbidopa/levodopa 25/100, 3 tablets 3 times per day (increased per pt) Rasagiline, 1 mg daily Memantine, 10 mg twice per day   PREVIOUS MEDICATIONS: risperdone (for years and developed drug induced Parkinsons Disease); lithium in past; primidone (up to 50 mg 3 times per day); propranolol LA, 120 mg daily  ALLERGIES:  No Known Allergies  CURRENT MEDICATIONS:  Outpatient Encounter Medications as of 02/16/2023  Medication Sig   acetaminophen (TYLENOL) 325 MG tablet    albuterol (VENTOLIN HFA) 108 (90 Base) MCG/ACT inhaler Inhale 2 puffs into the lungs 4 (four) times daily as needed for wheezing or shortness of breath.    aspirin EC 81 MG tablet Take 81 mg by mouth daily.   atorvastatin (LIPITOR) 20 MG tablet Take 20 mg by mouth at bedtime.    budesonide-formoterol (SYMBICORT) 80-4.5 MCG/ACT inhaler Inhale 2 puffs into the lungs 2 (two) times daily.   busPIRone (BUSPAR) 15 MG tablet Take 15 mg by mouth 2 (two) times daily.   calcium carbonate (OSCAL) 1500 (600 Ca) MG TABS tablet Take by mouth.   Calcium Carbonate-Vitamin D 600-400 MG-UNIT tablet Take 1 tablet by mouth 2 (two) times daily.   carbidopa-levodopa (SINEMET IR) 25-100 MG tablet Take by mouth 3 (three) times daily. Taken 3 tabs tid 53m 1pm, and 5pm   diphenhydrAMINE (BENADRYL) 25 MG tablet Take 1 tablet (25 mg total) by mouth every 8 (eight) hours as needed.   ferrous sulfate 325 (65 FE) MG tablet Take 325 mg by mouth every Monday, Wednesday, and Friday.   gabapentin (NEURONTIN) 300 MG capsule Take by mouth.   hydrOXYzine (ATARAX) 25 MG tablet Take 25 mg by mouth daily as needed for anxiety.   ibuprofen (ADVIL) 800 MG tablet Take 1 tablet (800 mg total) by mouth 3 (three) times daily.   memantine (NAMENDA) 10 MG tablet Take 20 mg by mouth at bedtime.   methocarbamol (ROBAXIN) 500 MG tablet Take 1 tablet (500 mg total) by mouth every 8 (eight) hours as  needed for muscle spasms.   morphine (MS CONTIN) 15 MG 12 hr tablet Take 1 tablet by mouth in the morning and at bedtime.   Multiple Vitamin (MULTIVITAMIN WITH MINERALS) TABS tablet Take 1 tablet by mouth daily.   omeprazole (PRILOSEC) 40 MG capsule Take 40 mg by mouth 2 (two) times daily before a meal.   Oxycodone HCl 20 MG TABS Take 1 tablet by mouth  5 (five) times daily.   oxyCODONE-acetaminophen (PERCOCET/ROXICET) 5-325 MG tablet Take 2 tablets by mouth 2 (two) times daily.   primidone (MYSOLINE) 50 MG tablet Take 50 mg by mouth 3 (three) times daily. For tremors   propranolol ER (INDERAL LA) 60 MG 24 hr capsule Take 60 mg by mouth daily.   rasagiline (AZILECT) 0.5 MG TABS tablet TAKE TWO TABLETS BY MOUTH ONCE A DAY TO SLOW PROGRESSION OF PARKINSON'S DISEASE   RELISTOR 150 MG TABS Take 3 tablets by mouth daily.   tamsulosin (FLOMAX) 0.4 MG CAPS capsule Take 0.4 mg by mouth at bedtime.   Tiotropium Bromide Monohydrate 2.5 MCG/ACT AERS Inhale 2 puffs into the lungs daily.   Calcium Carb-Cholecalciferol 600-10 MG-MCG TABS Take 1 tablet by mouth 2 (two) times daily. (Patient not taking: Reported on 02/16/2023)   cyclobenzaprine (FLEXERIL) 10 MG tablet Take 10 mg by mouth 3 (three) times daily as needed. (Patient not taking: Reported on 09/18/2022)   JARDIANCE 10 MG TABS tablet Take 10 mg by mouth daily. (Patient not taking: Reported on 09/18/2022)   lidocaine (LIDODERM) 5 % Place 1 patch onto the skin daily. Remove & Discard patch within 12 hours or as directed by MD (Patient not taking: Reported on 09/18/2022)   metoCLOPramide (REGLAN) 10 MG tablet TAKE 1 TABLET BY MOUTH IN THE MORNING AND AT BEDTIME (Patient not taking: Reported on 09/18/2022)   mirtazapine (REMERON) 30 MG tablet Take 30 mg by mouth at bedtime. (Patient not taking: Reported on 09/18/2022)   mupirocin ointment (BACTROBAN) 2 % SMARTSIG:Sparingly Topical 2-3 Times Daily PRN (Patient not taking: Reported on 09/18/2022)   pregabalin (LYRICA)  300 MG capsule Take 300 mg by mouth 2 (two) times daily.   Tapentadol HCl 100 MG TABS Take 150 mg by mouth 4 (four) times daily as needed (PAIN).  (Patient not taking: Reported on 09/18/2022)   trifluoperazine (STELAZINE) 1 MG tablet Take 3 mg by mouth at bedtime. (Patient not taking: Reported on 09/18/2022)   valACYclovir (VALTREX) 1000 MG tablet Take 1 tablet (1,000 mg total) by mouth 3 (three) times daily. (Patient not taking: Reported on 02/16/2023)   XTAMPZA ER 18 MG C12A Take 1 capsule by mouth 2 (two) times daily. (Patient not taking: Reported on 02/16/2023)   [DISCONTINUED] ARIPiprazole (ABILIFY) 5 MG tablet Take 5 mg by mouth at bedtime.   [DISCONTINUED] risperiDONE (RISPERDAL) 0.5 MG tablet Take 1 tablet (0.5 mg total) by mouth at bedtime as needed for up to 30 days (for agitation).   [DISCONTINUED] silver sulfADIAZINE (SILVADENE) 1 % cream Apply 1 Application topically 2 (two) times daily.   No facility-administered encounter medications on file as of 02/16/2023.    Objective:   PHYSICAL EXAMINATION:    VITALS:   Vitals:   02/16/23 1007  BP: 136/80  Pulse: 98  Resp: 20  SpO2: 100%  Weight: 193 lb (87.5 kg)  Height: '5\' 10"'$  (1.778 m)     GEN:  The patient appears stated age and is in NAD. HEENT:  Normocephalic, atraumatic.  The mucous membranes are moist. The superficial temporal arteries are without ropiness or tenderness. CV:  RRR Lungs:  CTAB Neck/HEME:  There are no carotid bruits bilaterally.  Neurological examination:  Orientation: The patient is alert.  He is oriented to person, place.  He is intermittently confused. Cranial nerves: There is good facial symmetry with facial hypomimia. The speech is fluent and clear. Soft palate rises symmetrically and there is no tongue deviation. Hearing is intact to  conversational tone. Sensation: Sensation is intact to light touch throughout Motor: Strength is at least antigravity x4.  Last took carbidopa/levodopa at 8am (3 tabs).     Movement examination: Tone: There is normal tone in the upper and lower extremities Abnormal movements:  no tremor today Coordination:  There is he has trouble with hand opening and closing on the left, but more because of pain than decremation.  He has no decremation with any other form of rapid alternating movements including finger taps, alternation of supination/pronation of the forearm, heel or toe taps bilaterally. Gait and Station: The patient pushes off to arise.  He ambulates with his cane.  He is wide based.  He is not shuffling.    I have reviewed and interpreted the following labs independently    Chemistry      Component Value Date/Time   NA 138 01/29/2023 2205   K 3.9 01/29/2023 2205   CL 100 01/29/2023 2205   CO2 27 01/29/2023 2205   BUN 21 01/29/2023 2205   CREATININE 1.10 01/29/2023 2205      Component Value Date/Time   CALCIUM 9.4 01/29/2023 2205   ALKPHOS 75 01/29/2023 2205   AST 19 01/29/2023 2205   ALT 11 01/29/2023 2205   BILITOT <0.1 (L) 01/29/2023 2205       Lab Results  Component Value Date   WBC 9.3 01/29/2023   HGB 13.8 01/29/2023   HCT 42.5 01/29/2023   MCV 91.4 01/29/2023   PLT 227 01/29/2023    Lab Results  Component Value Date   TSH 0.678 05/10/2020     Total time spent on today's visit was 42 minutes, including both face-to-face time and nonface-to-face time.  Time included that spent on review of records (prior notes available to me/labs/imaging if pertinent), discussing treatment and goals, answering patient's questions and coordinating care.  Cc:  Cl[facility], [facility]

## 2023-02-13 ENCOUNTER — Inpatient Hospital Stay: Admission: RE | Admit: 2023-02-13 | Payer: Medicare PPO | Source: Ambulatory Visit

## 2023-02-16 ENCOUNTER — Ambulatory Visit (INDEPENDENT_AMBULATORY_CARE_PROVIDER_SITE_OTHER): Payer: No Typology Code available for payment source | Admitting: Neurology

## 2023-02-16 ENCOUNTER — Encounter: Payer: Self-pay | Admitting: Neurology

## 2023-02-16 VITALS — BP 136/80 | HR 98 | Resp 20 | Ht 70.0 in | Wt 193.0 lb

## 2023-02-16 DIAGNOSIS — G20C Parkinsonism, unspecified: Secondary | ICD-10-CM

## 2023-02-16 DIAGNOSIS — R1319 Other dysphagia: Secondary | ICD-10-CM | POA: Diagnosis not present

## 2023-02-16 MED ORDER — CARBIDOPA-LEVODOPA 25-100 MG PO TABS: 2.0000 | ORAL_TABLET | Freq: Three times a day (TID) | ORAL | 1 refills | Status: AC

## 2023-02-16 NOTE — Patient Instructions (Signed)
Take carbidopa/levodopa 25/100, 2 tablets at 9am/1pm/5pm.    As a reminder, carbidopa/levodopa can be taken at the same time as a carbohydrate, but we like to have you take your pill either 30 minutes before a protein source or 1 hour after as protein can interfere with carbidopa/levodopa absorption.   We will schedule a modified barium swallow examination to look at your swallowing.  That being said, most of your swallowing may be due to your dentition (or lack thereof) on the bottom and we may need to get a speech/swallow therapist and dentist involved, depending on the results of the swallow test.  Local and Online Resources for Power over Parkinson's Group  February 2024   LOCAL [place] PARKINSON'S GROUPS   Power over Parkinson's Group:    Power Over Parkinson's Patient Education Group will be Wednesday, February 14th-*Hybrid meting*- in person at [facility] location and via [facility] 2:00-3:00 pm.   Starting in November, Power over [employer] and Care Partner Groups will meet together, with plans for separate break out session for caregivers (*this will be evolving over the next few months) Upcoming Power over Parkinson's Meetings/Care Partner Support:  2nd Wednesdays of the month at 2 pm:  February 14th, March 13th  [place] at [email or web address] if interested in participating in this group    [place]! Moves Classes in [place]!  Starting January 08, 2023.  Thursdays, January 25-February 19, 2023 at 11:45 am.  [facility].  Free to participate (through grant with [employer]) and registration required.  Please contact Corwin Levins at [email or web address] to register. Let's Try Pickleball-$25 for 6 weeks of Pickleball, starting February 2nd.  Contact Corwin Levins for more details.  [email or web address] Parkinson's Community Game Night at [facility], Date TBA in Late  February, email Sarah for details [email or web address] THRIVE, an exercise group for early-onset Parkinson's Disease will resume meeting in February, email Sarah for details [email or web address] Parkinson's CarePartner Group for Men is in the works, if interested email [email or web address] ACT FITNESS Chair Yoga classes "Train and Gain", Fridays 10 am, ACT Fitness.  Contact Gina at [phone].  PWR! Moves Dynegy Instructor-Led Classes offering at [employer]!  TUESDAYS and Wednesdays 1-2 pm.   Contact Vonna Kotyk at  [email or web address]  or [phone] (Tuesday classes are modified for chair and standing only) Drumming for Parkinson's will be held on 2nd and 4th Mondays at 11:00 am.   Located at the [place]  Contact Doylene Canning at [email or web address] or [phone]  Dance for Parkinson 's classes will be on Tuesdays 9:30am-10:30am starting back in February. Located in the [employer] , in the first floor of the [employer] ([place] To register:  [email or web address] or [place] Class, Mondays at 11 am.  Call [phone] for details SAVE THE DATE and REGISTER:  [place] Chapter of [place]:  [employer].  Conversations about Parkinson's.  Saturday, February 14, 2023, 9:00 am-2:00 pm.  [place], *In person or online via [place]*.  Register at [email or web address] or call Beverlee Nims at [phone].  [place]:  www.[email or web address]  PD Health at Home continues:  Mindfulness Mondays, Wellness Wednesdays, Fitness Fridays   Upcoming Education:   Parkinson's 101.  Wednesday, February 7th, 1-2 pm The Changing Landscape of Intimacy.  Wednesday, February 14th, 1-2 pm New to Parkinson's:  Care Partner Basics.   Wednesday, February 28th, 1-2 pm Expert Briefing:  Understanding Pain in Parkinson's.   Wednesday, April 10th,  1-2 pm  Register for virtual education and Patent attorney (webinars) at [email or web address] Please check out their website to sign up for emails and see their full online offerings      [place]:  [email or web address]   Third Thursday Webinars:  On the third Thursday of every month at 12 p.m. ET, join our free live webinars to learn about various aspects of living with Parkinson's disease and our work to speed medical breakthroughs.  Upcoming Webinar:  Therapies for Tomorrow:  How Better Clinical Trial Design Leads to Better Treatments.  Thursday, February 15th at 12 noon. Check out additional information on their website to see their full online offerings    [facility]:  [email or web address]  Upcoming Webinar:   [place].  Thursday, February 8th, 12 noon Webinar Series:  Living with Parkinson's Meetup.   Third Thursdays each month, 3 pm  Care Partner Monthly Meetup.  With Robin Searing Phinney.  First Tuesday of each month, 2 pm  Check out additional information to Live Well Today on their website    Parkinson and Movement Disorders (PMD) Alliance:  www.pmdalliance.org  NeuroLife Online:  Online Education Events  Sign up for emails, which are sent weekly to give you updates on programming and online offerings    Parkinson's Association of the [place]:  www.parkinsonassociation.org  Information on online support groups, education events, and online exercises including Yoga, Parkinson's exercises and more-LOTS of information on links to PD resources and online events  Virtual Support Group through Parkinson's Association of the [place]; next one is scheduled for Wednesday, Feb 7th  MOVEMENT AND EXERCISE OPPORTUNITIES  PWR! Moves Classes at [place].  Wednesdays 10 and 11 am.   Contact Amy Marriott, PT [email or web address] if interested.  PWR! Moves Class offerings at [employer]. *TUESDAYS* and Wednesdays 1-2 pm.    Contact Vonna Kotyk at  [email or web address]    Parkinson's Wellness Recovery (PWR! Moves)  www.[email or web address]  Info on the PWR! Virtual Experience:  You will have access to our expertise?through self-assessment, guided plans that start with the PD-specific fundamentals, educational content, tips, Q&A with an expert, and a growing Art therapist of PD-specific pre-recorded and live exercise classes of varying types and intensity - both physical and cognitive! If that is not enough, we offer 1:1 wellness consultations (in-person or virtual) to personalize your PWR! Research scientist (medical).   [place] Fridays:   As part of the PD Health @ Home program, this free video series focuses each week on one aspect of fitness designed to support people living with Parkinson's.? These weekly videos highlight the [place] fitness guidelines for people with Parkinson's disease.  ModemGamers.si  Dance for PD website is offering free, live-stream classes throughout the week, as well as links to [employer] of classes:  [email or web address]  Virtual dance and Pilates for Parkinson's classes: Click on the Community Tab> Parkinson's Movement Initiative Tab.  To register for classes and for more information, visit www.[email or web address] and click the "community" tab.   [employer] Parkinson's Cycling Classes   [facility]:  Thursdays @ Noon-Live classes at [employer] ([employer] at [email or web address]?or [phone])  [facility] [employer]: Virtual Classes Mondays and Thursdays Jeanette Caprice classes Tuesday, Wednesday and Thursday (contact [place] at [email or web address] ?or [phone])  [place]   Varied levels of classes are offered Tuesdays  and Thursdays at [employer].   Stretching with Verdis Frederickson weekly class is also offered for people with Parkinson's  To observe a class or for more information, call [phone] or email Hezzie Bump at [email or web address]   ADDITIONAL SUPPORT AND RESOURCES  Well[employer]Spring [employer]:Online Caregiver Education Opportunities:  www.well-springsolutions.org/caregiver-education/caregiver-support-group.  You may also contact Vickki Muff at [email or web address] or [phone].     Family Caregiver [phone].  Thursday, March 7th, 10:15-1:45 at [facility].  Register with [facility] (see above) Well-Spring Navigator:  Just1Navigator program, a?free service to help individuals and families through the journey of determining care for older adults.  The "Navigator" is a Vickki Muff, Education officer, museum, who will speak with a prospective client and/or loved ones to provide an assessment of the situation and a set of recommendations for a personalized care plan -- all free of charge, and whether?Well[employer]Spring [employer] offers the needed service or not. If the need is not a service we provide, we are well-connected with reputable programs in town that we can refer you to.  www.well-springsolutions.org or to speak with the Navigator, call [phone].

## 2023-02-18 ENCOUNTER — Telehealth: Payer: Self-pay | Admitting: Neurology

## 2023-02-18 ENCOUNTER — Telehealth (HOSPITAL_COMMUNITY): Payer: Self-pay

## 2023-02-18 NOTE — Telephone Encounter (Signed)
Received this today from the Speech therapist:  Dr.Melaya Hoselton - I spoke with this patient this morning to schedule his swallow test and he stated that he wants nothing to do with [facility] or [place]. I am unsure of another health system that performs Modified Barium Swallows but I wanted to inform you of this. I am cancelling the current order in.   Tell patient he will need to follow up with the [facility] to do his testing there.  He has a neurologist there that can order his testing.  If he doesn't wish to be seen at [facility] and is getting care via [place], we can send him a formal d/c letter.

## 2023-02-18 NOTE — Telephone Encounter (Signed)
Called patient and left message regarding the information we received from Speech Therapy

## 2023-02-18 NOTE — Telephone Encounter (Signed)
Called and spoke with patient to schedule Modified Barium Swallow - patient stated he wants nothing to do with [place] or [facility] and to cancel order at this time. Sent Dr.Tat a secure chat to inform her of the conversation had.

## 2023-02-20 NOTE — Telephone Encounter (Signed)
Called patient he said he had never said he didn't want to come back here he told me he has a neuro doc at the [place] that he is going to start seeing but if he needed anything he would come to our office to see Dr. Carles Collet

## 2023-05-18 ENCOUNTER — Other Ambulatory Visit: Payer: Self-pay | Admitting: Otolaryngology

## 2023-05-18 DIAGNOSIS — R1314 Dysphagia, pharyngoesophageal phase: Secondary | ICD-10-CM

## 2023-05-18 DIAGNOSIS — F17200 Nicotine dependence, unspecified, uncomplicated: Secondary | ICD-10-CM

## 2023-05-20 ENCOUNTER — Other Ambulatory Visit: Payer: Self-pay | Admitting: Family Medicine

## 2023-05-20 DIAGNOSIS — I77819 Aortic ectasia, unspecified site: Secondary | ICD-10-CM

## 2023-05-22 DIAGNOSIS — M545 Low back pain, unspecified: Secondary | ICD-10-CM | POA: Diagnosis not present

## 2023-05-22 DIAGNOSIS — G8929 Other chronic pain: Secondary | ICD-10-CM | POA: Diagnosis not present

## 2023-05-22 DIAGNOSIS — M542 Cervicalgia: Secondary | ICD-10-CM | POA: Diagnosis not present

## 2023-05-22 DIAGNOSIS — E1140 Type 2 diabetes mellitus with diabetic neuropathy, unspecified: Secondary | ICD-10-CM | POA: Diagnosis not present

## 2023-05-22 DIAGNOSIS — Z79899 Other long term (current) drug therapy: Secondary | ICD-10-CM | POA: Diagnosis not present

## 2023-05-22 DIAGNOSIS — F319 Bipolar disorder, unspecified: Secondary | ICD-10-CM | POA: Diagnosis not present

## 2023-06-04 DIAGNOSIS — K224 Dyskinesia of esophagus: Secondary | ICD-10-CM | POA: Diagnosis not present

## 2023-06-04 DIAGNOSIS — K449 Diaphragmatic hernia without obstruction or gangrene: Secondary | ICD-10-CM | POA: Diagnosis not present

## 2023-06-04 DIAGNOSIS — R1310 Dysphagia, unspecified: Secondary | ICD-10-CM | POA: Diagnosis not present

## 2023-06-19 DIAGNOSIS — Z79899 Other long term (current) drug therapy: Secondary | ICD-10-CM | POA: Diagnosis not present

## 2023-06-19 DIAGNOSIS — F319 Bipolar disorder, unspecified: Secondary | ICD-10-CM | POA: Diagnosis not present

## 2023-06-19 DIAGNOSIS — M545 Low back pain, unspecified: Secondary | ICD-10-CM | POA: Diagnosis not present

## 2023-06-19 DIAGNOSIS — E1140 Type 2 diabetes mellitus with diabetic neuropathy, unspecified: Secondary | ICD-10-CM | POA: Diagnosis not present

## 2023-06-19 DIAGNOSIS — M542 Cervicalgia: Secondary | ICD-10-CM | POA: Diagnosis not present

## 2023-06-19 DIAGNOSIS — G8929 Other chronic pain: Secondary | ICD-10-CM | POA: Diagnosis not present

## 2023-06-19 DIAGNOSIS — F17210 Nicotine dependence, cigarettes, uncomplicated: Secondary | ICD-10-CM | POA: Diagnosis not present

## 2023-06-23 ENCOUNTER — Other Ambulatory Visit: Payer: Medicare PPO

## 2023-06-25 DIAGNOSIS — L0100 Impetigo, unspecified: Secondary | ICD-10-CM | POA: Diagnosis not present

## 2023-07-17 DIAGNOSIS — G8929 Other chronic pain: Secondary | ICD-10-CM | POA: Diagnosis not present

## 2023-07-17 DIAGNOSIS — M545 Low back pain, unspecified: Secondary | ICD-10-CM | POA: Diagnosis not present

## 2023-07-17 DIAGNOSIS — M542 Cervicalgia: Secondary | ICD-10-CM | POA: Diagnosis not present

## 2023-07-17 DIAGNOSIS — F17210 Nicotine dependence, cigarettes, uncomplicated: Secondary | ICD-10-CM | POA: Diagnosis not present

## 2023-07-17 DIAGNOSIS — E1140 Type 2 diabetes mellitus with diabetic neuropathy, unspecified: Secondary | ICD-10-CM | POA: Diagnosis not present

## 2023-07-17 DIAGNOSIS — F319 Bipolar disorder, unspecified: Secondary | ICD-10-CM | POA: Diagnosis not present

## 2023-07-17 DIAGNOSIS — Z79899 Other long term (current) drug therapy: Secondary | ICD-10-CM | POA: Diagnosis not present

## 2023-07-21 DIAGNOSIS — Z79899 Other long term (current) drug therapy: Secondary | ICD-10-CM | POA: Diagnosis not present

## 2023-08-07 ENCOUNTER — Emergency Department (HOSPITAL_BASED_OUTPATIENT_CLINIC_OR_DEPARTMENT_OTHER)
Admission: EM | Admit: 2023-08-07 | Discharge: 2023-08-07 | Discharge status: Against Medical Advice | Payer: Medicare PPO | Attending: Emergency Medicine | Admitting: Emergency Medicine

## 2023-08-07 ENCOUNTER — Other Ambulatory Visit: Payer: Self-pay

## 2023-08-07 ENCOUNTER — Encounter (HOSPITAL_BASED_OUTPATIENT_CLINIC_OR_DEPARTMENT_OTHER): Payer: Self-pay

## 2023-08-07 DIAGNOSIS — S41002D Unspecified open wound of left shoulder, subsequent encounter: Secondary | ICD-10-CM | POA: Insufficient documentation

## 2023-08-07 DIAGNOSIS — S1190XD Unspecified open wound of unspecified part of neck, subsequent encounter: Secondary | ICD-10-CM | POA: Insufficient documentation

## 2023-08-07 DIAGNOSIS — Z4800 Encounter for change or removal of nonsurgical wound dressing: Secondary | ICD-10-CM | POA: Diagnosis not present

## 2023-08-07 DIAGNOSIS — S41001D Unspecified open wound of right shoulder, subsequent encounter: Secondary | ICD-10-CM | POA: Insufficient documentation

## 2023-08-07 DIAGNOSIS — S0190XD Unspecified open wound of unspecified part of head, subsequent encounter: Secondary | ICD-10-CM | POA: Diagnosis not present

## 2023-08-07 DIAGNOSIS — Z5321 Procedure and treatment not carried out due to patient leaving prior to being seen by health care provider: Secondary | ICD-10-CM | POA: Diagnosis not present

## 2023-08-07 DIAGNOSIS — X58XXXD Exposure to other specified factors, subsequent encounter: Secondary | ICD-10-CM | POA: Insufficient documentation

## 2023-08-07 NOTE — ED Triage Notes (Signed)
Pt has history or developing wounds on head neck and shoulders. They are usually not painful. Pt has a wound that developed a month ago that is really painful. Other than the pain the wound looks like they usually do.No drainage noted to area. Some redness.Denies fever chills SOB

## 2023-08-07 NOTE — ED Notes (Signed)
Pt stated to registration he wanted to LWBS

## 2023-08-19 DIAGNOSIS — Z79899 Other long term (current) drug therapy: Secondary | ICD-10-CM | POA: Diagnosis not present

## 2023-08-19 DIAGNOSIS — F17210 Nicotine dependence, cigarettes, uncomplicated: Secondary | ICD-10-CM | POA: Diagnosis not present

## 2023-08-19 DIAGNOSIS — F319 Bipolar disorder, unspecified: Secondary | ICD-10-CM | POA: Diagnosis not present

## 2023-08-19 DIAGNOSIS — M545 Low back pain, unspecified: Secondary | ICD-10-CM | POA: Diagnosis not present

## 2023-08-19 DIAGNOSIS — E1140 Type 2 diabetes mellitus with diabetic neuropathy, unspecified: Secondary | ICD-10-CM | POA: Diagnosis not present

## 2023-08-19 DIAGNOSIS — M542 Cervicalgia: Secondary | ICD-10-CM | POA: Diagnosis not present

## 2023-08-19 DIAGNOSIS — G8929 Other chronic pain: Secondary | ICD-10-CM | POA: Diagnosis not present

## 2023-09-15 ENCOUNTER — Ambulatory Visit (HOSPITAL_BASED_OUTPATIENT_CLINIC_OR_DEPARTMENT_OTHER): Payer: Non-veteran care

## 2023-09-17 DIAGNOSIS — E1140 Type 2 diabetes mellitus with diabetic neuropathy, unspecified: Secondary | ICD-10-CM | POA: Diagnosis not present

## 2023-09-17 DIAGNOSIS — F319 Bipolar disorder, unspecified: Secondary | ICD-10-CM | POA: Diagnosis not present

## 2023-09-17 DIAGNOSIS — M545 Low back pain, unspecified: Secondary | ICD-10-CM | POA: Diagnosis not present

## 2023-09-17 DIAGNOSIS — Z79899 Other long term (current) drug therapy: Secondary | ICD-10-CM | POA: Diagnosis not present

## 2023-09-17 DIAGNOSIS — M542 Cervicalgia: Secondary | ICD-10-CM | POA: Diagnosis not present

## 2023-09-17 DIAGNOSIS — G8929 Other chronic pain: Secondary | ICD-10-CM | POA: Diagnosis not present

## 2023-09-21 DIAGNOSIS — Z79899 Other long term (current) drug therapy: Secondary | ICD-10-CM | POA: Diagnosis not present

## 2023-10-17 DIAGNOSIS — M542 Cervicalgia: Secondary | ICD-10-CM | POA: Diagnosis not present

## 2023-10-17 DIAGNOSIS — E1140 Type 2 diabetes mellitus with diabetic neuropathy, unspecified: Secondary | ICD-10-CM | POA: Diagnosis not present

## 2023-10-17 DIAGNOSIS — F319 Bipolar disorder, unspecified: Secondary | ICD-10-CM | POA: Diagnosis not present

## 2023-10-17 DIAGNOSIS — G8929 Other chronic pain: Secondary | ICD-10-CM | POA: Diagnosis not present

## 2023-10-17 DIAGNOSIS — Z79899 Other long term (current) drug therapy: Secondary | ICD-10-CM | POA: Diagnosis not present

## 2023-10-17 DIAGNOSIS — F17210 Nicotine dependence, cigarettes, uncomplicated: Secondary | ICD-10-CM | POA: Diagnosis not present

## 2023-10-17 DIAGNOSIS — M545 Low back pain, unspecified: Secondary | ICD-10-CM | POA: Diagnosis not present

## 2023-10-30 DIAGNOSIS — L97522 Non-pressure chronic ulcer of other part of left foot with fat layer exposed: Secondary | ICD-10-CM | POA: Diagnosis not present

## 2023-10-30 DIAGNOSIS — L080 Pyoderma: Secondary | ICD-10-CM | POA: Diagnosis not present

## 2023-10-30 DIAGNOSIS — M2042 Other hammer toe(s) (acquired), left foot: Secondary | ICD-10-CM | POA: Diagnosis not present

## 2023-11-06 DIAGNOSIS — L97522 Non-pressure chronic ulcer of other part of left foot with fat layer exposed: Secondary | ICD-10-CM | POA: Diagnosis not present

## 2023-11-16 DIAGNOSIS — Z79899 Other long term (current) drug therapy: Secondary | ICD-10-CM | POA: Diagnosis not present

## 2023-11-16 DIAGNOSIS — M545 Low back pain, unspecified: Secondary | ICD-10-CM | POA: Diagnosis not present

## 2023-11-16 DIAGNOSIS — G8929 Other chronic pain: Secondary | ICD-10-CM | POA: Diagnosis not present

## 2023-11-16 DIAGNOSIS — E1140 Type 2 diabetes mellitus with diabetic neuropathy, unspecified: Secondary | ICD-10-CM | POA: Diagnosis not present

## 2023-11-16 DIAGNOSIS — F319 Bipolar disorder, unspecified: Secondary | ICD-10-CM | POA: Diagnosis not present

## 2023-11-16 DIAGNOSIS — M542 Cervicalgia: Secondary | ICD-10-CM | POA: Diagnosis not present

## 2023-11-18 DIAGNOSIS — Z79899 Other long term (current) drug therapy: Secondary | ICD-10-CM | POA: Diagnosis not present

## 2023-11-19 DIAGNOSIS — L97522 Non-pressure chronic ulcer of other part of left foot with fat layer exposed: Secondary | ICD-10-CM | POA: Diagnosis not present

## 2023-12-17 DIAGNOSIS — Z79899 Other long term (current) drug therapy: Secondary | ICD-10-CM | POA: Diagnosis not present

## 2023-12-17 DIAGNOSIS — M542 Cervicalgia: Secondary | ICD-10-CM | POA: Diagnosis not present

## 2023-12-17 DIAGNOSIS — E1140 Type 2 diabetes mellitus with diabetic neuropathy, unspecified: Secondary | ICD-10-CM | POA: Diagnosis not present

## 2023-12-17 DIAGNOSIS — G8929 Other chronic pain: Secondary | ICD-10-CM | POA: Diagnosis not present

## 2023-12-17 DIAGNOSIS — F319 Bipolar disorder, unspecified: Secondary | ICD-10-CM | POA: Diagnosis not present

## 2023-12-17 DIAGNOSIS — M545 Low back pain, unspecified: Secondary | ICD-10-CM | POA: Diagnosis not present

## 2024-01-14 DIAGNOSIS — M545 Low back pain, unspecified: Secondary | ICD-10-CM | POA: Diagnosis not present

## 2024-01-14 DIAGNOSIS — M542 Cervicalgia: Secondary | ICD-10-CM | POA: Diagnosis not present

## 2024-01-14 DIAGNOSIS — E1140 Type 2 diabetes mellitus with diabetic neuropathy, unspecified: Secondary | ICD-10-CM | POA: Diagnosis not present

## 2024-01-14 DIAGNOSIS — F319 Bipolar disorder, unspecified: Secondary | ICD-10-CM | POA: Diagnosis not present

## 2024-01-14 DIAGNOSIS — Z79899 Other long term (current) drug therapy: Secondary | ICD-10-CM | POA: Diagnosis not present

## 2024-01-14 DIAGNOSIS — G8929 Other chronic pain: Secondary | ICD-10-CM | POA: Diagnosis not present

## 2024-01-19 DIAGNOSIS — Z79899 Other long term (current) drug therapy: Secondary | ICD-10-CM | POA: Diagnosis not present

## 2024-01-23 DIAGNOSIS — E119 Type 2 diabetes mellitus without complications: Secondary | ICD-10-CM | POA: Diagnosis not present

## 2024-01-23 DIAGNOSIS — M545 Low back pain, unspecified: Secondary | ICD-10-CM | POA: Diagnosis not present

## 2024-01-23 DIAGNOSIS — G8929 Other chronic pain: Secondary | ICD-10-CM | POA: Diagnosis not present

## 2024-02-02 ENCOUNTER — Telehealth: Payer: Self-pay | Admitting: Podiatry

## 2024-02-02 ENCOUNTER — Ambulatory Visit: Payer: Medicare PPO | Admitting: Podiatry

## 2024-02-09 ENCOUNTER — Ambulatory Visit (INDEPENDENT_AMBULATORY_CARE_PROVIDER_SITE_OTHER): Payer: Medicare PPO | Admitting: Podiatry

## 2024-02-09 ENCOUNTER — Ambulatory Visit (INDEPENDENT_AMBULATORY_CARE_PROVIDER_SITE_OTHER): Payer: Medicare PPO

## 2024-02-09 ENCOUNTER — Encounter: Payer: Self-pay | Admitting: Podiatry

## 2024-02-09 DIAGNOSIS — M2042 Other hammer toe(s) (acquired), left foot: Secondary | ICD-10-CM

## 2024-02-09 DIAGNOSIS — M778 Other enthesopathies, not elsewhere classified: Secondary | ICD-10-CM | POA: Diagnosis not present

## 2024-02-09 DIAGNOSIS — L84 Corns and callosities: Secondary | ICD-10-CM | POA: Diagnosis not present

## 2024-02-10 NOTE — Progress Notes (Signed)
  Subjective:  Patient ID: Alan Brown, male    DOB: [birth year],  MRN: [id number]  Chief Complaint  Patient presents with   Toe Pain    Patient states 3 months ago he has been having toe pain with his left foot 3rd toe, it is a sharp continues pain and sometimes shoots up his foot. Patient is taking medication for pain.    73 y.o. male presents with the above complaint. History confirmed with patient.   Objective:  Physical Exam: warm, good capillary refill, no trophic changes or ulcerative lesions, normal DP and PT pulses, normal sensory exam, and left third toe semirigid hammertoe contracture with distal tip callus.  Radiographs: Multiple views x-ray of the left foot: no fracture, dislocation, swelling or degenerative changes noted and digital contractures Assessment:   1. Hammertoe of left foot   2. Callus of foot      Plan:  Patient was evaluated and treated and all questions answered.  Hammertoe -XR reviewed with patient -Educated on etiology of deformity -Discussed padding and shoe gear changes -Hammertoe crest pad dispensed -Discussed with patient that they would benefit from surgical intervention after having failed all conservative therapy.  Discussed option of a flexor tenotomy in the office versus surgical hammertoe correction -Callus was debrided as a courtesy to alleviate pain and pressure on the tip of the toe.  Return if symptoms worsen or fail to improve.

## 2024-02-11 DIAGNOSIS — E1140 Type 2 diabetes mellitus with diabetic neuropathy, unspecified: Secondary | ICD-10-CM | POA: Diagnosis not present

## 2024-02-11 DIAGNOSIS — F319 Bipolar disorder, unspecified: Secondary | ICD-10-CM | POA: Diagnosis not present

## 2024-02-11 DIAGNOSIS — M545 Low back pain, unspecified: Secondary | ICD-10-CM | POA: Diagnosis not present

## 2024-02-11 DIAGNOSIS — M542 Cervicalgia: Secondary | ICD-10-CM | POA: Diagnosis not present

## 2024-02-11 DIAGNOSIS — G8929 Other chronic pain: Secondary | ICD-10-CM | POA: Diagnosis not present

## 2024-02-11 DIAGNOSIS — Z79899 Other long term (current) drug therapy: Secondary | ICD-10-CM | POA: Diagnosis not present

## 2024-02-16 DIAGNOSIS — Z79899 Other long term (current) drug therapy: Secondary | ICD-10-CM | POA: Diagnosis not present

## 2024-04-26 DIAGNOSIS — Z20828 Contact with and (suspected) exposure to other viral communicable diseases: Secondary | ICD-10-CM | POA: Diagnosis not present

## 2024-05-18 DIAGNOSIS — Z20828 Contact with and (suspected) exposure to other viral communicable diseases: Secondary | ICD-10-CM | POA: Diagnosis not present

## 2024-06-22 DIAGNOSIS — E1165 Type 2 diabetes mellitus with hyperglycemia: Secondary | ICD-10-CM | POA: Diagnosis not present

## 2024-06-22 DIAGNOSIS — M549 Dorsalgia, unspecified: Secondary | ICD-10-CM | POA: Diagnosis not present

## 2024-06-22 DIAGNOSIS — F429 Obsessive-compulsive disorder, unspecified: Secondary | ICD-10-CM | POA: Diagnosis not present

## 2024-06-22 DIAGNOSIS — L989 Disorder of the skin and subcutaneous tissue, unspecified: Secondary | ICD-10-CM | POA: Diagnosis not present

## 2024-06-22 DIAGNOSIS — F4310 Post-traumatic stress disorder, unspecified: Secondary | ICD-10-CM | POA: Diagnosis not present

## 2024-06-22 DIAGNOSIS — F1190 Opioid use, unspecified, uncomplicated: Secondary | ICD-10-CM | POA: Diagnosis not present

## 2024-06-22 DIAGNOSIS — G8929 Other chronic pain: Secondary | ICD-10-CM | POA: Diagnosis not present

## 2024-06-22 DIAGNOSIS — F17210 Nicotine dependence, cigarettes, uncomplicated: Secondary | ICD-10-CM | POA: Diagnosis not present

## 2024-06-22 DIAGNOSIS — G20A1 Parkinson's disease without dyskinesia, without mention of fluctuations: Secondary | ICD-10-CM | POA: Diagnosis not present

## 2024-07-01 ENCOUNTER — Other Ambulatory Visit: Payer: Self-pay

## 2024-07-01 ENCOUNTER — Encounter (HOSPITAL_BASED_OUTPATIENT_CLINIC_OR_DEPARTMENT_OTHER): Payer: Self-pay

## 2024-07-01 ENCOUNTER — Emergency Department (HOSPITAL_BASED_OUTPATIENT_CLINIC_OR_DEPARTMENT_OTHER)
Admission: EM | Admit: 2024-07-01 | Discharge: 2024-07-01 | Disposition: A | Discharge status: Home / Self Care | Attending: Emergency Medicine | Admitting: Emergency Medicine

## 2024-07-01 DIAGNOSIS — G8929 Other chronic pain: Secondary | ICD-10-CM | POA: Diagnosis not present

## 2024-07-01 DIAGNOSIS — Z7982 Long term (current) use of aspirin: Secondary | ICD-10-CM | POA: Insufficient documentation

## 2024-07-01 DIAGNOSIS — Z4800 Encounter for change or removal of nonsurgical wound dressing: Secondary | ICD-10-CM | POA: Diagnosis present

## 2024-07-01 DIAGNOSIS — Z5189 Encounter for other specified aftercare: Secondary | ICD-10-CM

## 2024-07-01 MED ORDER — MORPHINE SULFATE ER 15 MG PO TBCR: 15.0000 mg | EXTENDED_RELEASE_TABLET | Freq: Two times a day (BID) | ORAL | 0 refills | Status: AC

## 2024-07-01 MED ORDER — HYDROMORPHONE HCL 1 MG/ML IJ SOLN
1.0000 mg | Freq: Once | INTRAMUSCULAR | Status: AC
  Administered 2024-07-01: 1 mg via INTRAMUSCULAR
  Filled 2024-07-01: qty 1

## 2024-07-01 NOTE — ED Provider Notes (Signed)
 [place] EMERGENCY DEPARTMENT AT [facility] Provider Note   CSN: [id number] Arrival date & time: 07/01/24  1811     Patient presents with: No chief complaint on file.   Alan Brown is a 73 y.o. male.   Patient here for me to reevaluate sores that he has on his top of his face and back.  He is on doxycycline .  He has chronic pain.  He is on morphine  and oxycodone .  He supposed to follow-up with his pain doctor on Monday.  He is about to run out of his morphine .  He feels like the wounds have made his pain worse.  He denies any fever or chills.  Denies any weakness numbness tingling.  The history is provided by the patient.       Prior to Admission medications   Medication Sig Start Date End Date Taking? Authorizing Provider  morphine  (MS CONTIN ) 15 MG 12 hr tablet Take 1 tablet (15 mg total) by mouth every 12 (twelve) hours for 4 doses. 07/01/24 07/03/24 Yes Costas Sena, DO  acetaminophen  (TYLENOL ) 325 MG tablet  12/12/21   [provider]  albuterol  (VENTOLIN  HFA) 108 (90 Base) MCG/ACT inhaler Inhale 2 puffs into the lungs 4 (four) times daily as needed for wheezing or shortness of breath.     [provider]  aspirin  EC 81 MG tablet Take 81 mg by mouth daily.    [provider]  atorvastatin  (LIPITOR) 20 MG tablet Take 20 mg by mouth at bedtime.     [provider]  budesonide-formoterol (SYMBICORT) 80-4.5 MCG/ACT inhaler Inhale 2 puffs into the lungs 2 (two) times daily.    [provider]  busPIRone (BUSPAR) 15 MG tablet Take 15 mg by mouth 2 (two) times daily.    [provider]  Calcium  Carb-Cholecalciferol 600-10 MG-MCG TABS Take 1 tablet by mouth 2 (two) times daily. 07/26/21   [provider]  calcium  carbonate (OSCAL) 1500 (600 Ca) MG TABS tablet Take by mouth.    [provider]  Calcium  Carbonate-Vitamin D  600-400 MG-UNIT tablet Take 1 tablet by mouth 2 (two) times daily.     [provider]  carbidopa -levodopa  (SINEMET  IR) 25-100 MG tablet Take 2 tablets by mouth 3 (three) times daily. 9am/1pm/5pm 02/16/23   Tat, Asberry RAMAN, DO  cyclobenzaprine (FLEXERIL) 10 MG tablet Take 10 mg by mouth 3 (three) times daily as needed. 09/29/21   [provider]  diphenhydrAMINE  (BENADRYL ) 25 MG tablet Take 1 tablet (25 mg total) by mouth every 8 (eight) hours as needed. 01/04/22   HortonRoxie HERO, DO  ferrous sulfate  325 (65 FE) MG tablet Take 325 mg by mouth every Monday, Wednesday, and Friday.    [provider]  gabapentin  (NEURONTIN ) 300 MG capsule Take by mouth. 02/03/22   [provider]  hydrOXYzine (ATARAX) 25 MG tablet Take 25 mg by mouth daily as needed for anxiety. 01/22/23   [provider]  ibuprofen  (ADVIL ) 800 MG tablet Take 1 tablet (800 mg total) by mouth 3 (three) times daily. 07/03/22   Elnor Hila P, DO  JARDIANCE 10 MG TABS tablet Take 10 mg by mouth daily. 01/02/22   [provider]  lidocaine  (LIDODERM ) 5 % Place 1 patch onto the skin daily. Remove & Discard patch within 12 hours or as directed by MD 07/17/22   Theadore Ozell HERO, MD  memantine (NAMENDA) 10 MG tablet Take 20 mg by mouth at bedtime.    [provider]  methocarbamol  (ROBAXIN ) 500 MG tablet Take 1 tablet (500 mg total) by mouth every 8 (eight) hours as needed for muscle spasms. 07/17/22   Theadore Ozell HERO, MD  metoCLOPramide  (REGLAN ) 10 MG tablet TAKE 1 TABLET BY MOUTH IN THE MORNING AND AT BEDTIME 06/20/22   Esterwood, Amy S, PA-C  mirtazapine (REMERON) 30 MG tablet Take 30 mg by mouth at bedtime. 10/29/21   [provider]  Multiple Vitamin (MULTIVITAMIN WITH MINERALS) TABS tablet Take 1 tablet by mouth daily.    [provider]  mupirocin  ointment (BACTROBAN ) 2 %  10/17/21   [provider]  omeprazole (PRILOSEC) 40 MG capsule Take 40 mg by mouth 2 (two) times daily before a meal.    [provider]  Oxycodone  HCl  20 MG TABS Take 1 tablet by mouth 5 (five) times daily. 08/09/20   [provider]  oxyCODONE -acetaminophen  (PERCOCET/ROXICET) 5-325 MG tablet Take 2 tablets by mouth 2 (two) times daily. 07/09/22   [provider]  pregabalin (LYRICA) 300 MG capsule Take 300 mg by mouth 2 (two) times daily.    [provider]  primidone  (MYSOLINE ) 50 MG tablet Take 50 mg by mouth 3 (three) times daily. For tremors    [provider]  propranolol  ER (INDERAL  LA) 60 MG 24 hr capsule Take 60 mg by mouth daily.    [provider]  rasagiline (AZILECT) 0.5 MG TABS tablet TAKE TWO TABLETS BY MOUTH ONCE A DAY TO SLOW PROGRESSION OF PARKINSON'S DISEASE 08/08/21   [provider]  RELISTOR 150 MG TABS Take 3 tablets by mouth daily. 12/30/21   [provider]  tamsulosin  (FLOMAX ) 0.4 MG CAPS capsule Take 0.4 mg by mouth at bedtime.    [provider]  Tapentadol HCl 100 MG TABS Take 150 mg by mouth 4 (four) times daily as needed (PAIN).    [provider]  Tiotropium Bromide Monohydrate 2.5 MCG/ACT AERS Inhale 2 puffs into the lungs daily.    [provider]  trifluoperazine (STELAZINE) 1 MG tablet Take 3 mg by mouth at bedtime.    [provider]  valACYclovir  (VALTREX ) 1000 MG tablet Take 1 tablet (1,000 mg total) by mouth 3 (three) times daily. 01/11/23   Geroldine Berg, MD  XTAMPZA  ER 18 MG C12A Take 1 capsule by mouth 2 (two) times daily. 07/09/22   [provider]    Allergies: Patient has no known allergies.    Review of Systems  Updated Vital Signs BP 120/76   Pulse 95   Temp 98.4 F (36.9 C)   Resp 16   SpO2 97%   Physical Exam Vitals and nursing note reviewed.  Constitutional:      General: He is not in acute distress.    Appearance: He is well-developed.  HENT:     Head: Normocephalic and atraumatic.  Eyes:     Conjunctiva/sclera: Conjunctivae normal.  Cardiovascular:     Rate and Rhythm: Normal  rate and regular rhythm.     Heart sounds: No murmur heard. Pulmonary:     Effort: Pulmonary effort is normal. No respiratory distress.     Breath sounds: Normal breath sounds.  Abdominal:     Palpations: Abdomen is soft.     Tenderness: There is no abdominal tenderness.  Musculoskeletal:        General: No swelling.     Cervical back: Neck supple.  Skin:    General: Skin is warm and dry.  Capillary Refill: Capillary refill takes less than 2 seconds.     Comments: He has got wounds/sores to the top of his scalp, back overall margins are clean dry and intact there is no purulent drainage  Neurological:     Mental Status: He is alert.  Psychiatric:        Mood and Affect: Mood normal.     (all labs ordered are listed, but only abnormal results are displayed) Labs Reviewed - No data to display  EKG: None  Radiology: No results found.   Procedures   Medications Ordered in the ED  HYDROmorphone  (DILAUDID ) injection 1 mg (has no administration in time range)                                    Medical Decision Making Risk Prescription drug management.   [place] Charlie Piety is here for wound check of his chronic sores.  He is on some antibiotics Bactroban  and doxycycline  at this time.  Overall his sores on his head back in various places on his body are overall clean dry and intact.  I do not think there is any major infectious process.  They are localized well-healing.  Both of them are scabbed over.  He has normal vitals.  No fever.  Have no concern for systemic infection.  Recommend that he continue his antibiotics.  He does have chronic pain.  He has follow-up with his pain clinic on Monday.  But he is going to run out of his morphine  over the weekend.  Will send in a prescription for his morphine  for 2 days.  He was given a dose of Dilaudid  here in the ED.  Otherwise I recommend pain management per his chronic pain doctor.  Discharged in good condition.  Understands  return precautions.  This chart was dictated using voice recognition software.  Despite best efforts to proofread,  errors can occur which can change the documentation meaning.      Final diagnoses:  Visit for wound check  Other chronic pain    ED Discharge Orders          Ordered    morphine  (MS CONTIN ) 15 MG 12 hr tablet  Every 12 hours        07/01/24 1831               Ruthe Cornet, DO 07/01/24 1851

## 2024-07-01 NOTE — ED Triage Notes (Signed)
 Pt c/o pain out the yin yang. C/o sores on knee, face, top of head, neck, back, everywhere. Sharp pain intermittently, but it just hurts. Hx chronic pain, 100% disability, follow up appt Monday at pain clinic.

## 2024-07-01 NOTE — Discharge Instructions (Signed)
 Continue pain management at home with your meds.  Continue Bactroban  and doxycycline .  Follow-up with your primary care doctor.

## 2024-07-03 ENCOUNTER — Emergency Department (HOSPITAL_BASED_OUTPATIENT_CLINIC_OR_DEPARTMENT_OTHER)
Admission: EM | Admit: 2024-07-03 | Discharge: 2024-07-03 | Disposition: A | Discharge status: Home / Self Care | Attending: Emergency Medicine | Admitting: Emergency Medicine

## 2024-07-03 ENCOUNTER — Encounter (HOSPITAL_BASED_OUTPATIENT_CLINIC_OR_DEPARTMENT_OTHER): Payer: Self-pay

## 2024-07-03 ENCOUNTER — Other Ambulatory Visit: Payer: Self-pay

## 2024-07-03 DIAGNOSIS — G894 Chronic pain syndrome: Secondary | ICD-10-CM | POA: Diagnosis not present

## 2024-07-03 DIAGNOSIS — Z7982 Long term (current) use of aspirin: Secondary | ICD-10-CM | POA: Diagnosis not present

## 2024-07-03 DIAGNOSIS — M549 Dorsalgia, unspecified: Secondary | ICD-10-CM | POA: Diagnosis present

## 2024-07-03 MED ORDER — KETOROLAC TROMETHAMINE 30 MG/ML IJ SOLN
30.0000 mg | Freq: Once | INTRAMUSCULAR | Status: AC
  Administered 2024-07-03: 30 mg via INTRAMUSCULAR
  Filled 2024-07-03: qty 1

## 2024-07-03 NOTE — ED Triage Notes (Signed)
 Pt POV d/t pain form sores all over body.  Pt takes pain meds at home but pt states is is not working.  Pt has sores on head, back, neck, face and right knee.

## 2024-07-03 NOTE — ED Provider Notes (Signed)
 [place] EMERGENCY DEPARTMENT AT [facility]  Provider Note  CSN: [id number] Arrival date & time: 07/03/24 0530  History Chief Complaint  Patient presents with   Pain Management    Alan Brown is a 73 y.o. male with history of chronic pain, managed by [facility] on Morphine  ER 15mg  BID (#60 filled 7/3) and Percocet 10/325 (#180 filled 6/28) for breakthrough reports he has been taking extra of both for cutaneous sores on his head and back present for several weeks. He has run out of his Morphine  and has only 4 of his Percocet remaining. He is here for pain control. He was seen in the ED on 7/18 for same and given a dose of Dilaudid  here and Rx for 2 days of Morphine  but pharmacy would not fill it due to being 10+ days too early. He is scheduled to see his Pain Management clinic on 7/21.    Home Medications Prior to Admission medications   Medication Sig Start Date End Date Taking? Authorizing Provider  acetaminophen  (TYLENOL ) 325 MG tablet  12/12/21   [provider]  albuterol  (VENTOLIN  HFA) 108 (90 Base) MCG/ACT inhaler Inhale 2 puffs into the lungs 4 (four) times daily as needed for wheezing or shortness of breath.     [provider]  aspirin  EC 81 MG tablet Take 81 mg by mouth daily.    [provider]  atorvastatin  (LIPITOR) 20 MG tablet Take 20 mg by mouth at bedtime.     [provider]  budesonide-formoterol (SYMBICORT) 80-4.5 MCG/ACT inhaler Inhale 2 puffs into the lungs 2 (two) times daily.    [provider]  busPIRone (BUSPAR) 15 MG tablet Take 15 mg by mouth 2 (two) times daily.    [provider]  Calcium  Carb-Cholecalciferol 600-10 MG-MCG TABS Take 1 tablet by mouth 2 (two) times daily. 07/26/21   [provider]  calcium  carbonate (OSCAL) 1500 (600 Ca) MG TABS tablet Take by mouth.    [provider]  Calcium  Carbonate-Vitamin D  600-400 MG-UNIT tablet Take 1 tablet by mouth 2  (two) times daily.    [provider]  carbidopa -levodopa  (SINEMET  IR) 25-100 MG tablet Take 2 tablets by mouth 3 (three) times daily. 9am/1pm/5pm 02/16/23   Tat, Asberry RAMAN, DO  cyclobenzaprine (FLEXERIL) 10 MG tablet Take 10 mg by mouth 3 (three) times daily as needed. 09/29/21   [provider]  diphenhydrAMINE  (BENADRYL ) 25 MG tablet Take 1 tablet (25 mg total) by mouth every 8 (eight) hours as needed. 01/04/22   Horton, Roxie HERO, DO  ferrous sulfate  325 (65 FE) MG tablet Take 325 mg by mouth every Monday, Wednesday, and Friday.    [provider]  gabapentin  (NEURONTIN ) 300 MG capsule Take by mouth. 02/03/22   [provider]  hydrOXYzine (ATARAX) 25 MG tablet Take 25 mg by mouth daily as needed for anxiety. 01/22/23   [provider]  ibuprofen  (ADVIL ) 800 MG tablet Take 1 tablet (800 mg total) by mouth 3 (three) times daily. 07/03/22   Elnor Hila P, DO  JARDIANCE 10 MG TABS tablet Take 10 mg by mouth daily. 01/02/22   [provider]  lidocaine  (LIDODERM ) 5 % Place 1 patch onto the skin daily. Remove & Discard patch within 12 hours or as directed by MD 07/17/22   Theadore Ozell HERO, MD  memantine (NAMENDA) 10 MG tablet Take 20 mg by mouth at bedtime.    [provider]  methocarbamol  (ROBAXIN ) 500  MG tablet Take 1 tablet (500 mg total) by mouth every 8 (eight) hours as needed for muscle spasms. 07/17/22   Theadore Ozell HERO, MD  metoCLOPramide  (REGLAN ) 10 MG tablet TAKE 1 TABLET BY MOUTH IN THE MORNING AND AT BEDTIME 06/20/22   Esterwood, Amy S, PA-C  mirtazapine (REMERON) 30 MG tablet Take 30 mg by mouth at bedtime. 10/29/21   [provider]  morphine  (MS CONTIN ) 15 MG 12 hr tablet Take 1 tablet (15 mg total) by mouth every 12 (twelve) hours for 4 doses. 07/01/24 07/03/24  Ruthe Cornet, DO  Multiple Vitamin (MULTIVITAMIN WITH MINERALS) TABS tablet Take 1 tablet by mouth daily.    [provider]  mupirocin  ointment (BACTROBAN ) 2  %  10/17/21   [provider]  omeprazole (PRILOSEC) 40 MG capsule Take 40 mg by mouth 2 (two) times daily before a meal.    [provider]  Oxycodone  HCl 20 MG TABS Take 1 tablet by mouth 5 (five) times daily. 08/09/20   [provider]  oxyCODONE -acetaminophen  (PERCOCET/ROXICET) 5-325 MG tablet Take 2 tablets by mouth 2 (two) times daily. 07/09/22   [provider]  pregabalin (LYRICA) 300 MG capsule Take 300 mg by mouth 2 (two) times daily.    [provider]  primidone  (MYSOLINE ) 50 MG tablet Take 50 mg by mouth 3 (three) times daily. For tremors    [provider]  propranolol  ER (INDERAL  LA) 60 MG 24 hr capsule Take 60 mg by mouth daily.    [provider]  rasagiline (AZILECT) 0.5 MG TABS tablet TAKE TWO TABLETS BY MOUTH ONCE A DAY TO SLOW PROGRESSION OF PARKINSON'S DISEASE 08/08/21   [provider]  RELISTOR 150 MG TABS Take 3 tablets by mouth daily. 12/30/21   [provider]  tamsulosin  (FLOMAX ) 0.4 MG CAPS capsule Take 0.4 mg by mouth at bedtime.    [provider]  Tapentadol HCl 100 MG TABS Take 150 mg by mouth 4 (four) times daily as needed (PAIN).    [provider]  Tiotropium Bromide Monohydrate 2.5 MCG/ACT AERS Inhale 2 puffs into the lungs daily.    [provider]  trifluoperazine (STELAZINE) 1 MG tablet Take 3 mg by mouth at bedtime.    [provider]  valACYclovir  (VALTREX ) 1000 MG tablet Take 1 tablet (1,000 mg total) by mouth 3 (three) times daily. 01/11/23   Geroldine Berg, MD  XTAMPZA  ER 18 MG C12A Take 1 capsule by mouth 2 (two) times daily. 07/09/22   [provider]     Allergies    Patient has no known allergies.   Review of Systems   Review of Systems Please see HPI for pertinent positives and negatives  Physical Exam BP 130/81   Pulse 87   Temp 98.4 F (36.9 C) (Oral)   Resp 18   Ht 5' 10 (1.778 m)   Wt 86.2 kg   SpO2 100%   BMI  27.26 kg/m   Physical Exam Vitals and nursing note reviewed.  HENT:     Head: Normocephalic.     Nose: Nose normal.  Eyes:     Extraocular Movements: Extraocular movements intact.  Pulmonary:     Effort: Pulmonary effort is normal.  Musculoskeletal:        General: Normal range of motion.     Cervical back: Neck supple.  Skin:    Findings: No rash (on exposed skin).     Comments: Shallow ulcerations on scalp, neck  and back without signs of active infection.   Neurological:     Mental Status: He is alert and oriented to person, place, and time.  Psychiatric:        Mood and Affect: Mood normal.     ED Results / Procedures / Treatments   EKG None  Procedures Procedures  Medications Ordered in the ED Medications  ketorolac  (TORADOL ) 30 MG/ML injection 30 mg (has no administration in time range)    Initial Impression and Plan  Patient here for pain management in setting of running out of his usual pain medications early. He drove here tonight. I explained the role of extended release morphine . I also explained that the ED is not able to give any prescriptions for pain medications and that given he drove here tonight I cannot give him any sedating medications here either. Offered IM Toradol  and recommend he contact his Pain clinic for long term management.   ED Course       MDM Rules/Calculators/A&P Medical Decision Making Problems Addressed: Chronic pain syndrome: chronic illness or injury  Risk Prescription drug management.     Final Clinical Impression(s) / ED Diagnoses Final diagnoses:  Chronic pain syndrome    Rx / DC Orders ED Discharge Orders     None        Roselyn Carlin NOVAK, MD 07/03/24 0600

## 2024-07-04 DIAGNOSIS — Z79899 Other long term (current) drug therapy: Secondary | ICD-10-CM | POA: Diagnosis not present

## 2024-07-04 DIAGNOSIS — E1140 Type 2 diabetes mellitus with diabetic neuropathy, unspecified: Secondary | ICD-10-CM | POA: Diagnosis not present

## 2024-07-04 DIAGNOSIS — F319 Bipolar disorder, unspecified: Secondary | ICD-10-CM | POA: Diagnosis not present

## 2024-07-04 DIAGNOSIS — M545 Low back pain, unspecified: Secondary | ICD-10-CM | POA: Diagnosis not present

## 2024-07-04 DIAGNOSIS — G8929 Other chronic pain: Secondary | ICD-10-CM | POA: Diagnosis not present

## 2024-07-04 DIAGNOSIS — M542 Cervicalgia: Secondary | ICD-10-CM | POA: Diagnosis not present

## 2024-07-04 DIAGNOSIS — Z6823 Body mass index (BMI) 23.0-23.9, adult: Secondary | ICD-10-CM | POA: Diagnosis not present

## 2024-07-04 DIAGNOSIS — R208 Other disturbances of skin sensation: Secondary | ICD-10-CM | POA: Diagnosis not present

## 2024-07-04 DIAGNOSIS — T402X5A Adverse effect of other opioids, initial encounter: Secondary | ICD-10-CM | POA: Diagnosis not present

## 2024-07-05 ENCOUNTER — Encounter (HOSPITAL_BASED_OUTPATIENT_CLINIC_OR_DEPARTMENT_OTHER): Attending: General Surgery | Admitting: General Surgery

## 2024-07-05 ENCOUNTER — Encounter (HOSPITAL_BASED_OUTPATIENT_CLINIC_OR_DEPARTMENT_OTHER): Payer: Self-pay | Admitting: Emergency Medicine

## 2024-07-05 ENCOUNTER — Other Ambulatory Visit: Payer: Self-pay

## 2024-07-05 DIAGNOSIS — Z7982 Long term (current) use of aspirin: Secondary | ICD-10-CM | POA: Insufficient documentation

## 2024-07-05 DIAGNOSIS — G894 Chronic pain syndrome: Secondary | ICD-10-CM | POA: Insufficient documentation

## 2024-07-05 DIAGNOSIS — L98499 Non-pressure chronic ulcer of skin of other sites with unspecified severity: Secondary | ICD-10-CM | POA: Diagnosis present

## 2024-07-05 DIAGNOSIS — F424 Excoriation (skin-picking) disorder: Secondary | ICD-10-CM | POA: Diagnosis not present

## 2024-07-05 DIAGNOSIS — G8929 Other chronic pain: Secondary | ICD-10-CM | POA: Diagnosis present

## 2024-07-05 NOTE — ED Triage Notes (Signed)
 EMS report: Pt BIB [facility] from home ambulatory throughout transport. Called out for chronic all over pain. Reports being out of his morphine  for the past two days. Reported to EMS he typically takes 30 morphine  3 times a day and 10 oxy 6 times a day.   EMS VS BP 160/90, PR 90, RR 18, 96% RA.

## 2024-07-06 ENCOUNTER — Emergency Department (HOSPITAL_BASED_OUTPATIENT_CLINIC_OR_DEPARTMENT_OTHER)
Admission: EM | Admit: 2024-07-06 | Discharge: 2024-07-06 | Disposition: A | Discharge status: Home / Self Care | Attending: Emergency Medicine | Admitting: Emergency Medicine

## 2024-07-06 DIAGNOSIS — G894 Chronic pain syndrome: Secondary | ICD-10-CM

## 2024-07-06 DIAGNOSIS — G8929 Other chronic pain: Secondary | ICD-10-CM | POA: Diagnosis not present

## 2024-07-06 DIAGNOSIS — Z79899 Other long term (current) drug therapy: Secondary | ICD-10-CM | POA: Diagnosis not present

## 2024-07-06 DIAGNOSIS — L989 Disorder of the skin and subcutaneous tissue, unspecified: Secondary | ICD-10-CM | POA: Diagnosis not present

## 2024-07-06 MED ORDER — ONDANSETRON 4 MG PO TBDP
8.0000 mg | ORAL_TABLET | Freq: Once | ORAL | Status: AC
  Administered 2024-07-06: 8 mg via ORAL
  Filled 2024-07-06: qty 2

## 2024-07-06 MED ORDER — HYDROMORPHONE HCL 1 MG/ML IJ SOLN
2.0000 mg | Freq: Once | INTRAMUSCULAR | Status: AC
  Administered 2024-07-06: 2 mg via INTRAMUSCULAR
  Filled 2024-07-06: qty 2

## 2024-07-06 NOTE — ED Notes (Signed)
Witnessed pt leaving.  

## 2024-07-06 NOTE — ED Provider Notes (Signed)
 [facility]  EMERGENCY DEPARTMENT AT [facility] Provider Note   CSN: [id number] Arrival date & time: 07/05/24  2041     Patient presents with: Pain   Alan Brown is a 73 y.o. male.   Presents to the emergency department for evaluation of chronic pain.  Patient recently changed pain management specialist at [facility].  He reports that his morphine  dose was increased.  He was given a new prescription but the pharmacy would not fill it.  He ran out of the morphine  because he had his normal dose doubled and then was taking more oxycodone  than he was supposed to to fill in the gaps, now has neither.  He reports that he did see his pain management specialist today and they sent prescriptions but the pharmacy denied them.       Prior to Admission medications   Medication Sig Start Date End Date Taking? Authorizing Provider  acetaminophen  (TYLENOL ) 325 MG tablet  12/12/21   [provider]  albuterol  (VENTOLIN  HFA) 108 (90 Base) MCG/ACT inhaler Inhale 2 puffs into the lungs 4 (four) times daily as needed for wheezing or shortness of breath.     [provider]  aspirin  EC 81 MG tablet Take 81 mg by mouth daily.    [provider]  atorvastatin  (LIPITOR) 20 MG tablet Take 20 mg by mouth at bedtime.     [provider]  budesonide-formoterol (SYMBICORT) 80-4.5 MCG/ACT inhaler Inhale 2 puffs into the lungs 2 (two) times daily.    [provider]  busPIRone (BUSPAR) 15 MG tablet Take 15 mg by mouth 2 (two) times daily.    [provider]  Calcium  Carb-Cholecalciferol 600-10 MG-MCG TABS Take 1 tablet by mouth 2 (two) times daily. 07/26/21   [provider]  calcium  carbonate (OSCAL) 1500 (600 Ca) MG TABS tablet Take by mouth.    [provider]  Calcium  Carbonate-Vitamin D  600-400 MG-UNIT tablet Take 1 tablet by mouth 2 (two) times daily.    [provider]  carbidopa -levodopa  (SINEMET   IR) 25-100 MG tablet Take 2 tablets by mouth 3 (three) times daily. 9am/1pm/5pm 02/16/23   Tat, Asberry RAMAN, DO  cyclobenzaprine (FLEXERIL) 10 MG tablet Take 10 mg by mouth 3 (three) times daily as needed. 09/29/21   [provider]  diphenhydrAMINE  (BENADRYL ) 25 MG tablet Take 1 tablet (25 mg total) by mouth every 8 (eight) hours as needed. 01/04/22   Horton, Roxie HERO, DO  ferrous sulfate  325 (65 FE) MG tablet Take 325 mg by mouth every Monday, Wednesday, and Friday.    [provider]  gabapentin  (NEURONTIN ) 300 MG capsule Take by mouth. 02/03/22   [provider]  hydrOXYzine (ATARAX) 25 MG tablet Take 25 mg by mouth daily as needed for anxiety. 01/22/23   [provider]  ibuprofen  (ADVIL ) 800 MG tablet Take 1 tablet (800 mg total) by mouth 3 (three) times daily. 07/03/22   Elnor Hila P, DO  JARDIANCE 10 MG TABS tablet Take 10 mg by mouth daily. 01/02/22   [provider]  lidocaine  (LIDODERM ) 5 % Place 1 patch onto the skin daily. Remove & Discard patch within 12 hours or as directed by MD 07/17/22   Theadore Ozell HERO, MD  memantine (NAMENDA) 10 MG tablet Take 20 mg by mouth at bedtime.    [provider]  methocarbamol  (ROBAXIN ) 500 MG tablet Take 1 tablet (500 mg total) by mouth every 8 (eight) hours as needed for muscle  spasms. 07/17/22   Theadore Ozell HERO, MD  metoCLOPramide  (REGLAN ) 10 MG tablet TAKE 1 TABLET BY MOUTH IN THE MORNING AND AT BEDTIME 06/20/22   Esterwood, Amy S, PA-C  mirtazapine (REMERON) 30 MG tablet Take 30 mg by mouth at bedtime. 10/29/21   [provider]  Multiple Vitamin (MULTIVITAMIN WITH MINERALS) TABS tablet Take 1 tablet by mouth daily.    [provider]  mupirocin  ointment (BACTROBAN ) 2 %  10/17/21   [provider]  omeprazole (PRILOSEC) 40 MG capsule Take 40 mg by mouth 2 (two) times daily before a meal.    [provider]  Oxycodone  HCl 20 MG TABS Take 1 tablet by mouth 5 (five) times  daily. 08/09/20   [provider]  oxyCODONE -acetaminophen  (PERCOCET/ROXICET) 5-325 MG tablet Take 2 tablets by mouth 2 (two) times daily. 07/09/22   [provider]  pregabalin (LYRICA) 300 MG capsule Take 300 mg by mouth 2 (two) times daily.    [provider]  primidone  (MYSOLINE ) 50 MG tablet Take 50 mg by mouth 3 (three) times daily. For tremors    [provider]  propranolol  ER (INDERAL  LA) 60 MG 24 hr capsule Take 60 mg by mouth daily.    [provider]  rasagiline (AZILECT) 0.5 MG TABS tablet TAKE TWO TABLETS BY MOUTH ONCE A DAY TO SLOW PROGRESSION OF PARKINSON'S DISEASE 08/08/21   [provider]  RELISTOR 150 MG TABS Take 3 tablets by mouth daily. 12/30/21   [provider]  tamsulosin  (FLOMAX ) 0.4 MG CAPS capsule Take 0.4 mg by mouth at bedtime.    [provider]  Tapentadol HCl 100 MG TABS Take 150 mg by mouth 4 (four) times daily as needed (PAIN).    [provider]  Tiotropium Bromide Monohydrate 2.5 MCG/ACT AERS Inhale 2 puffs into the lungs daily.    [provider]  trifluoperazine (STELAZINE) 1 MG tablet Take 3 mg by mouth at bedtime.    [provider]  valACYclovir  (VALTREX ) 1000 MG tablet Take 1 tablet (1,000 mg total) by mouth 3 (three) times daily. 01/11/23   Geroldine Berg, MD  XTAMPZA  ER 18 MG C12A Take 1 capsule by mouth 2 (two) times daily. 07/09/22   [provider]    Allergies: Patient has no known allergies.    Review of Systems  Updated Vital Signs BP 139/73 (BP Location: Right Arm)   Pulse 82   Temp 98.9 F (37.2 C) (Oral)   Resp 17   SpO2 98%   Physical Exam Vitals and nursing note reviewed.  Constitutional:      General: He is not in acute distress.    Appearance: He is well-developed.  HENT:     Head: Normocephalic and atraumatic.     Mouth/Throat:     Mouth: Mucous membranes are moist.  Eyes:     General: Vision grossly intact. Gaze aligned  appropriately.     Extraocular Movements: Extraocular movements intact.     Conjunctiva/sclera: Conjunctivae normal.  Cardiovascular:     Rate and Rhythm: Normal rate and regular rhythm.     Pulses: Normal pulses.     Heart sounds: Normal heart sounds, S1 normal and S2 normal. No murmur heard.    No friction rub. No gallop.  Pulmonary:     Effort: Pulmonary effort is normal. No respiratory distress.     Breath sounds: Normal breath sounds.  Abdominal:     Palpations: Abdomen is soft.  Tenderness: There is no abdominal tenderness. There is no guarding or rebound.     Hernia: No hernia is present.  Musculoskeletal:        General: No swelling.     Cervical back: Full passive range of motion without pain, normal range of motion and neck supple. No pain with movement, spinous process tenderness or muscular tenderness. Normal range of motion.     Right lower leg: No edema.     Left lower leg: No edema.  Skin:    General: Skin is warm and dry.     Capillary Refill: Capillary refill takes less than 2 seconds.     Findings: Wound (On back and neck, no erythema, no drainage or signs of infection) present. No ecchymosis, erythema or lesion.  Neurological:     Mental Status: He is alert and oriented to person, place, and time.     GCS: GCS eye subscore is 4. GCS verbal subscore is 5. GCS motor subscore is 6.     Cranial Nerves: Cranial nerves 2-12 are intact.     Sensory: Sensation is intact.     Motor: Motor function is intact. No weakness or abnormal muscle tone.     Coordination: Coordination is intact.  Psychiatric:        Mood and Affect: Mood normal.        Speech: Speech normal.        Behavior: Behavior normal.     (all labs ordered are listed, but only abnormal results are displayed) Labs Reviewed - No data to display  EKG: None  Radiology: No results found.   Procedures   Medications Ordered in the ED  HYDROmorphone  (DILAUDID ) injection 2 mg (2 mg Intramuscular  Given 07/06/24 0106)  ondansetron  (ZOFRAN -ODT) disintegrating tablet 8 mg (8 mg Oral Given 07/06/24 0108)                                    Medical Decision Making  Presents with chronic pain.  Patient reports that he has been having diarrhea and withdrawal symptoms.  He has not had any narcotic medication for 2 days.  He has been taking these meds chronically for years, initially given to him by the [facility] and then transferred to [facility] pain clinic.  I did inform him that I cannot prescribe any medications for his long-term use, but will provide him relief tonight.  He needs to contact his pain management specialist for a solution to his current problem.  Addendum: Patient eloped from the emergency department before treatment.     Final diagnoses:  Chronic pain syndrome    ED Discharge Orders     None          Haze Lonni PARAS, MD 07/06/24 [id number]

## 2024-07-06 NOTE — ED Notes (Signed)
 Patient left immediately after medication administration. Patient stated he had been here too long and didn't want to wait for anything else.

## 2024-07-13 DIAGNOSIS — E1140 Type 2 diabetes mellitus with diabetic neuropathy, unspecified: Secondary | ICD-10-CM | POA: Diagnosis not present

## 2024-07-13 DIAGNOSIS — Z79899 Other long term (current) drug therapy: Secondary | ICD-10-CM | POA: Diagnosis not present

## 2024-07-13 DIAGNOSIS — R208 Other disturbances of skin sensation: Secondary | ICD-10-CM | POA: Diagnosis not present

## 2024-07-13 DIAGNOSIS — F319 Bipolar disorder, unspecified: Secondary | ICD-10-CM | POA: Diagnosis not present

## 2024-07-13 DIAGNOSIS — T402X5A Adverse effect of other opioids, initial encounter: Secondary | ICD-10-CM | POA: Diagnosis not present

## 2024-07-13 DIAGNOSIS — M542 Cervicalgia: Secondary | ICD-10-CM | POA: Diagnosis not present

## 2024-07-13 DIAGNOSIS — G8929 Other chronic pain: Secondary | ICD-10-CM | POA: Diagnosis not present

## 2024-07-15 DIAGNOSIS — Z79899 Other long term (current) drug therapy: Secondary | ICD-10-CM | POA: Diagnosis not present

## 2024-07-19 ENCOUNTER — Encounter (HOSPITAL_BASED_OUTPATIENT_CLINIC_OR_DEPARTMENT_OTHER): Attending: General Surgery | Admitting: General Surgery

## 2024-07-19 DIAGNOSIS — L98499 Non-pressure chronic ulcer of skin of other sites with unspecified severity: Secondary | ICD-10-CM | POA: Insufficient documentation

## 2024-07-19 DIAGNOSIS — L97222 Non-pressure chronic ulcer of left calf with fat layer exposed: Secondary | ICD-10-CM | POA: Diagnosis not present

## 2024-07-19 DIAGNOSIS — F424 Excoriation (skin-picking) disorder: Secondary | ICD-10-CM | POA: Insufficient documentation

## 2024-07-19 DIAGNOSIS — G894 Chronic pain syndrome: Secondary | ICD-10-CM | POA: Insufficient documentation

## 2024-07-22 DIAGNOSIS — L98492 Non-pressure chronic ulcer of skin of other sites with fat layer exposed: Secondary | ICD-10-CM | POA: Diagnosis not present

## 2024-07-22 DIAGNOSIS — G894 Chronic pain syndrome: Secondary | ICD-10-CM | POA: Diagnosis not present

## 2024-07-22 DIAGNOSIS — L98499 Non-pressure chronic ulcer of skin of other sites with unspecified severity: Secondary | ICD-10-CM | POA: Diagnosis not present

## 2024-07-22 DIAGNOSIS — S1180XA Unspecified open wound of other specified part of neck, initial encounter: Secondary | ICD-10-CM | POA: Diagnosis not present

## 2024-07-26 DIAGNOSIS — L989 Disorder of the skin and subcutaneous tissue, unspecified: Secondary | ICD-10-CM | POA: Diagnosis not present

## 2024-07-30 DIAGNOSIS — F319 Bipolar disorder, unspecified: Secondary | ICD-10-CM | POA: Diagnosis not present

## 2024-07-30 DIAGNOSIS — T402X5A Adverse effect of other opioids, initial encounter: Secondary | ICD-10-CM | POA: Diagnosis not present

## 2024-07-30 DIAGNOSIS — Z79899 Other long term (current) drug therapy: Secondary | ICD-10-CM | POA: Diagnosis not present

## 2024-07-30 DIAGNOSIS — R208 Other disturbances of skin sensation: Secondary | ICD-10-CM | POA: Diagnosis not present

## 2024-07-30 DIAGNOSIS — E1140 Type 2 diabetes mellitus with diabetic neuropathy, unspecified: Secondary | ICD-10-CM | POA: Diagnosis not present

## 2024-07-30 DIAGNOSIS — M542 Cervicalgia: Secondary | ICD-10-CM | POA: Diagnosis not present

## 2024-08-02 ENCOUNTER — Encounter (HOSPITAL_BASED_OUTPATIENT_CLINIC_OR_DEPARTMENT_OTHER): Admitting: General Surgery

## 2024-08-02 DIAGNOSIS — L98499 Non-pressure chronic ulcer of skin of other sites with unspecified severity: Secondary | ICD-10-CM | POA: Diagnosis not present

## 2024-08-10 DIAGNOSIS — L989 Disorder of the skin and subcutaneous tissue, unspecified: Secondary | ICD-10-CM | POA: Diagnosis not present

## 2024-08-16 ENCOUNTER — Encounter (HOSPITAL_BASED_OUTPATIENT_CLINIC_OR_DEPARTMENT_OTHER): Attending: General Surgery | Admitting: General Surgery

## 2024-08-16 DIAGNOSIS — G894 Chronic pain syndrome: Secondary | ICD-10-CM | POA: Insufficient documentation

## 2024-08-16 DIAGNOSIS — F424 Excoriation (skin-picking) disorder: Secondary | ICD-10-CM | POA: Insufficient documentation

## 2024-08-16 DIAGNOSIS — L98499 Non-pressure chronic ulcer of skin of other sites with unspecified severity: Secondary | ICD-10-CM | POA: Diagnosis present

## 2024-08-16 DIAGNOSIS — L97222 Non-pressure chronic ulcer of left calf with fat layer exposed: Secondary | ICD-10-CM | POA: Diagnosis not present

## 2024-08-27 DIAGNOSIS — R208 Other disturbances of skin sensation: Secondary | ICD-10-CM | POA: Diagnosis not present

## 2024-08-27 DIAGNOSIS — T402X5A Adverse effect of other opioids, initial encounter: Secondary | ICD-10-CM | POA: Diagnosis not present

## 2024-08-27 DIAGNOSIS — Z79899 Other long term (current) drug therapy: Secondary | ICD-10-CM | POA: Diagnosis not present

## 2024-08-27 DIAGNOSIS — F319 Bipolar disorder, unspecified: Secondary | ICD-10-CM | POA: Diagnosis not present

## 2024-08-27 DIAGNOSIS — Z6826 Body mass index (BMI) 26.0-26.9, adult: Secondary | ICD-10-CM | POA: Diagnosis not present

## 2024-08-27 DIAGNOSIS — M542 Cervicalgia: Secondary | ICD-10-CM | POA: Diagnosis not present

## 2024-08-27 DIAGNOSIS — M129 Arthropathy, unspecified: Secondary | ICD-10-CM | POA: Diagnosis not present

## 2024-08-27 DIAGNOSIS — E1140 Type 2 diabetes mellitus with diabetic neuropathy, unspecified: Secondary | ICD-10-CM | POA: Diagnosis not present

## 2024-08-27 DIAGNOSIS — L989 Disorder of the skin and subcutaneous tissue, unspecified: Secondary | ICD-10-CM | POA: Diagnosis not present

## 2024-08-30 ENCOUNTER — Encounter (HOSPITAL_BASED_OUTPATIENT_CLINIC_OR_DEPARTMENT_OTHER): Admitting: General Surgery

## 2024-08-31 ENCOUNTER — Encounter (HOSPITAL_BASED_OUTPATIENT_CLINIC_OR_DEPARTMENT_OTHER): Admitting: General Surgery

## 2024-08-31 DIAGNOSIS — Z79899 Other long term (current) drug therapy: Secondary | ICD-10-CM | POA: Diagnosis not present

## 2024-08-31 DIAGNOSIS — L98499 Non-pressure chronic ulcer of skin of other sites with unspecified severity: Secondary | ICD-10-CM | POA: Diagnosis not present

## 2024-09-14 ENCOUNTER — Encounter (HOSPITAL_BASED_OUTPATIENT_CLINIC_OR_DEPARTMENT_OTHER): Attending: General Surgery | Admitting: Internal Medicine

## 2024-09-14 DIAGNOSIS — G894 Chronic pain syndrome: Secondary | ICD-10-CM | POA: Diagnosis not present

## 2024-09-14 DIAGNOSIS — L97222 Non-pressure chronic ulcer of left calf with fat layer exposed: Secondary | ICD-10-CM | POA: Diagnosis not present

## 2024-09-14 DIAGNOSIS — F424 Excoriation (skin-picking) disorder: Secondary | ICD-10-CM | POA: Insufficient documentation

## 2024-09-14 DIAGNOSIS — L98499 Non-pressure chronic ulcer of skin of other sites with unspecified severity: Secondary | ICD-10-CM | POA: Diagnosis present

## 2024-09-22 ENCOUNTER — Ambulatory Visit (HOSPITAL_BASED_OUTPATIENT_CLINIC_OR_DEPARTMENT_OTHER): Admitting: General Surgery

## 2024-09-28 ENCOUNTER — Encounter (HOSPITAL_BASED_OUTPATIENT_CLINIC_OR_DEPARTMENT_OTHER): Admitting: General Surgery

## 2024-09-28 DIAGNOSIS — L98499 Non-pressure chronic ulcer of skin of other sites with unspecified severity: Secondary | ICD-10-CM | POA: Diagnosis not present

## 2024-09-30 DIAGNOSIS — Z6826 Body mass index (BMI) 26.0-26.9, adult: Secondary | ICD-10-CM | POA: Diagnosis not present

## 2024-09-30 DIAGNOSIS — L989 Disorder of the skin and subcutaneous tissue, unspecified: Secondary | ICD-10-CM | POA: Diagnosis not present

## 2024-09-30 DIAGNOSIS — E1140 Type 2 diabetes mellitus with diabetic neuropathy, unspecified: Secondary | ICD-10-CM | POA: Diagnosis not present

## 2024-09-30 DIAGNOSIS — M542 Cervicalgia: Secondary | ICD-10-CM | POA: Diagnosis not present

## 2024-09-30 DIAGNOSIS — G8929 Other chronic pain: Secondary | ICD-10-CM | POA: Diagnosis not present

## 2024-09-30 DIAGNOSIS — F319 Bipolar disorder, unspecified: Secondary | ICD-10-CM | POA: Diagnosis not present

## 2024-09-30 DIAGNOSIS — Z79899 Other long term (current) drug therapy: Secondary | ICD-10-CM | POA: Diagnosis not present

## 2024-09-30 DIAGNOSIS — R208 Other disturbances of skin sensation: Secondary | ICD-10-CM | POA: Diagnosis not present

## 2024-09-30 DIAGNOSIS — T402X5A Adverse effect of other opioids, initial encounter: Secondary | ICD-10-CM | POA: Diagnosis not present

## 2024-10-06 DIAGNOSIS — Z79899 Other long term (current) drug therapy: Secondary | ICD-10-CM | POA: Diagnosis not present

## 2024-10-12 ENCOUNTER — Encounter (HOSPITAL_BASED_OUTPATIENT_CLINIC_OR_DEPARTMENT_OTHER): Admitting: General Surgery

## 2024-10-12 DIAGNOSIS — L98499 Non-pressure chronic ulcer of skin of other sites with unspecified severity: Secondary | ICD-10-CM | POA: Diagnosis not present

## 2024-10-26 ENCOUNTER — Encounter (HOSPITAL_BASED_OUTPATIENT_CLINIC_OR_DEPARTMENT_OTHER): Attending: General Surgery | Admitting: General Surgery

## 2024-10-26 DIAGNOSIS — F424 Excoriation (skin-picking) disorder: Secondary | ICD-10-CM | POA: Insufficient documentation

## 2024-10-26 DIAGNOSIS — L98499 Non-pressure chronic ulcer of skin of other sites with unspecified severity: Secondary | ICD-10-CM | POA: Insufficient documentation

## 2024-10-26 DIAGNOSIS — G894 Chronic pain syndrome: Secondary | ICD-10-CM | POA: Insufficient documentation

## 2024-11-09 ENCOUNTER — Encounter (HOSPITAL_BASED_OUTPATIENT_CLINIC_OR_DEPARTMENT_OTHER): Admitting: General Surgery

## 2024-11-30 ENCOUNTER — Encounter (HOSPITAL_BASED_OUTPATIENT_CLINIC_OR_DEPARTMENT_OTHER): Admitting: General Surgery

## 2024-12-05 ENCOUNTER — Encounter (HOSPITAL_BASED_OUTPATIENT_CLINIC_OR_DEPARTMENT_OTHER): Admitting: General Surgery

## 2024-12-06 ENCOUNTER — Encounter (HOSPITAL_BASED_OUTPATIENT_CLINIC_OR_DEPARTMENT_OTHER): Attending: General Surgery | Admitting: General Surgery

## 2024-12-06 DIAGNOSIS — L98499 Non-pressure chronic ulcer of skin of other sites with unspecified severity: Secondary | ICD-10-CM | POA: Diagnosis present

## 2024-12-06 DIAGNOSIS — F424 Excoriation (skin-picking) disorder: Secondary | ICD-10-CM | POA: Diagnosis not present

## 2024-12-06 DIAGNOSIS — G894 Chronic pain syndrome: Secondary | ICD-10-CM | POA: Diagnosis not present

## 2024-12-21 ENCOUNTER — Encounter (HOSPITAL_BASED_OUTPATIENT_CLINIC_OR_DEPARTMENT_OTHER): Admitting: General Surgery

## 2024-12-21 ENCOUNTER — Emergency Department (HOSPITAL_BASED_OUTPATIENT_CLINIC_OR_DEPARTMENT_OTHER)

## 2024-12-21 ENCOUNTER — Other Ambulatory Visit: Payer: Self-pay

## 2024-12-21 ENCOUNTER — Emergency Department (HOSPITAL_BASED_OUTPATIENT_CLINIC_OR_DEPARTMENT_OTHER): Admitting: Radiology

## 2024-12-21 ENCOUNTER — Emergency Department (HOSPITAL_BASED_OUTPATIENT_CLINIC_OR_DEPARTMENT_OTHER)
Admission: EM | Admit: 2024-12-21 | Discharge: 2024-12-21 | Disposition: A | Discharge status: Home / Self Care | Attending: Emergency Medicine | Admitting: Emergency Medicine

## 2024-12-21 ENCOUNTER — Encounter (HOSPITAL_BASED_OUTPATIENT_CLINIC_OR_DEPARTMENT_OTHER): Payer: Self-pay

## 2024-12-21 DIAGNOSIS — R059 Cough, unspecified: Secondary | ICD-10-CM | POA: Insufficient documentation

## 2024-12-21 DIAGNOSIS — Z7982 Long term (current) use of aspirin: Secondary | ICD-10-CM | POA: Diagnosis not present

## 2024-12-21 DIAGNOSIS — G20C Parkinsonism, unspecified: Secondary | ICD-10-CM | POA: Diagnosis not present

## 2024-12-21 DIAGNOSIS — R638 Other symptoms and signs concerning food and fluid intake: Secondary | ICD-10-CM | POA: Insufficient documentation

## 2024-12-21 DIAGNOSIS — R531 Weakness: Secondary | ICD-10-CM | POA: Diagnosis not present

## 2024-12-21 DIAGNOSIS — R410 Disorientation, unspecified: Secondary | ICD-10-CM | POA: Diagnosis not present

## 2024-12-21 DIAGNOSIS — R630 Anorexia: Secondary | ICD-10-CM

## 2024-12-21 LAB — COMPREHENSIVE METABOLIC PANEL WITH GFR
ALT: 7 U/L (ref 0–44)
AST: 23 U/L (ref 15–41)
Albumin: 4.5 g/dL (ref 3.5–5.0)
Alkaline Phosphatase: 87 U/L (ref 38–126)
Anion gap: 15 (ref 5–15)
BUN: 18 mg/dL (ref 8–23)
CO2: 24 mmol/L (ref 22–32)
Calcium: 9.7 mg/dL (ref 8.9–10.3)
Chloride: 98 mmol/L (ref 98–111)
Creatinine, Ser: 1.05 mg/dL (ref 0.61–1.24)
GFR, Estimated: >60 mL/min
Glucose, Bld: 123 mg/dL — ABNORMAL HIGH (ref 70–99)
Potassium: 3.5 mmol/L (ref 3.5–5.1)
Sodium: 137 mmol/L (ref 135–145)
Total Bilirubin: 0.5 mg/dL (ref 0.0–1.2)
Total Protein: 7.9 g/dL (ref 6.5–8.1)

## 2024-12-21 LAB — CBC
HCT: 41.2 % (ref 39.0–52.0)
Hemoglobin: 13.7 g/dL (ref 13.0–17.0)
MCH: 30.7 pg (ref 26.0–34.0)
MCHC: 33.3 g/dL (ref 30.0–36.0)
MCV: 92.4 fL (ref 80.0–100.0)
Platelets: 260 K/uL (ref 150–400)
RBC: 4.46 MIL/uL (ref 4.22–5.81)
RDW: 13.9 % (ref 11.5–15.5)
WBC: 14.6 K/uL — ABNORMAL HIGH (ref 4.0–10.5)
nRBC: 0 % (ref 0.0–0.2)

## 2024-12-21 LAB — LACTIC ACID, PLASMA
Lactic Acid, Venous: 1.7 mmol/L (ref 0.5–1.9)
Lactic Acid, Venous: 0.5 mmol/L (ref 0.5–1.9)

## 2024-12-21 LAB — URINALYSIS, ROUTINE W REFLEX MICROSCOPIC
Bilirubin Urine: NEGATIVE
Glucose, UA: NEGATIVE mg/dL
Hgb urine dipstick: NEGATIVE
Ketones, ur: NEGATIVE mg/dL
Leukocytes,Ua: NEGATIVE
Nitrite: NEGATIVE
Protein, ur: NEGATIVE mg/dL
Specific Gravity, Urine: >1.03 — ABNORMAL HIGH (ref 1.005–1.030)
pH: 5.5 (ref 5.0–8.0)

## 2024-12-21 LAB — TROPONIN T, HIGH SENSITIVITY
Troponin T High Sensitivity: 28 ng/L — ABNORMAL HIGH (ref 0–19)
Troponin T High Sensitivity: 27 ng/L — ABNORMAL HIGH (ref 0–19)

## 2024-12-21 LAB — MAGNESIUM: Magnesium: 2.3 mg/dL (ref 1.7–2.4)

## 2024-12-21 LAB — CBG MONITORING, ED: Glucose-Capillary: 85 mg/dL (ref 70–99)

## 2024-12-21 MED ORDER — DOXYCYCLINE HYCLATE 100 MG PO TABS
100.0000 mg | ORAL_TABLET | Freq: Once | ORAL | Status: AC
  Administered 2024-12-21: 100 mg via ORAL
  Filled 2024-12-21: qty 1

## 2024-12-21 MED ORDER — DOXYCYCLINE HYCLATE 100 MG PO CAPS: 100.0000 mg | ORAL_CAPSULE | Freq: Two times a day (BID) | ORAL | 0 refills | Status: AC

## 2024-12-21 NOTE — ED Triage Notes (Addendum)
 Pt has been significantly altered compared to baseline x1 week, not eating, and fatigued. Wound on back of head that has progressively gotten worse. Home health nurse suggested he come in. Has not taken home meds in 1 week

## 2024-12-21 NOTE — ED Provider Notes (Signed)
 " [place] EMERGENCY DEPARTMENT AT [facility] Provider Note   CSN: [id number] Arrival date & time: 12/21/24  1603     Patient presents with: Altered Mental Status and Wound Check   Alan Brown is a 74 y.o. male.   HPI     74 year old male with a history of Parkinson's, OCD, depression, GERD, chronic wound to his posterior neck for which he receives treatment at the [facility] wound and hyperbaric center, chronic pain who presents with concern for generalized weakness.   Wife is here and reports that he has not been himself for the last week, not eating, fatigued.  He has not taken his home medications in 1 week with the exception of his pain medications. Has not felt well, is forgetful.   Wife reports he has had more disorientation, like not being sure where they are going or forgetting what they are doing.  Denies numbness, weakness, difficulty talking or walking, visual changes or facial droop.   Denies chest pain Notes some dyspnea Has had some cough For his wound he uses Santyl, mupirocin  Has not had fever Home health nurse thought his wound looks worse. He says he would like antibiotics.   Wife reports everyone in the house has ahd the flu except for she believes him . He has not had typical flu symptoms but has been more generally weak, low appetite  Past Medical History:  Diagnosis Date   Anxiety    Arthritis    all through my body (01/26/2014)   Chronic lower back pain    Depression    Depression    GERD (gastroesophageal reflux disease)    OCD (obsessive compulsive disorder)    Parkinson's disease (HCC)    Pneumonia 1952; 11/2013   Prolapse, disk    lower back   Stroke [facility])    pt reported TIA    Prior to Admission medications  Medication Sig Start Date End Date Taking? Authorizing Provider  doxycycline  (VIBRAMYCIN ) 100 MG capsule Take 1 capsule (100 mg total) by mouth 2 (two) times daily for 7 days. 12/21/24 12/28/24 Yes Dreama Longs,  MD  acetaminophen  (TYLENOL ) 325 MG tablet  12/12/21   [provider]  albuterol  (VENTOLIN  HFA) 108 (90 Base) MCG/ACT inhaler Inhale 2 puffs into the lungs 4 (four) times daily as needed for wheezing or shortness of breath.     [provider]  aspirin  EC 81 MG tablet Take 81 mg by mouth daily.    [provider]  atorvastatin  (LIPITOR) 20 MG tablet Take 20 mg by mouth at bedtime.     [provider]  budesonide-formoterol (SYMBICORT) 80-4.5 MCG/ACT inhaler Inhale 2 puffs into the lungs 2 (two) times daily.    [provider]  busPIRone (BUSPAR) 15 MG tablet Take 15 mg by mouth 2 (two) times daily.    [provider]  Calcium  Carb-Cholecalciferol 600-10 MG-MCG TABS Take 1 tablet by mouth 2 (two) times daily. 07/26/21   [provider]  calcium  carbonate (OSCAL) 1500 (600 Ca) MG TABS tablet Take by mouth.    [provider]  Calcium  Carbonate-Vitamin D  600-400 MG-UNIT tablet Take 1 tablet by mouth 2 (two) times daily.    [provider]  carbidopa -levodopa  (SINEMET  IR) 25-100 MG tablet Take 2 tablets by mouth 3 (three) times daily. 9am/1pm/5pm 02/16/23   Tat, Asberry RAMAN, DO  cyclobenzaprine (FLEXERIL) 10 MG tablet Take 10 mg by mouth 3 (three) times daily as needed. 09/29/21   [provider]  diphenhydrAMINE  (BENADRYL ) 25 MG tablet Take 1 tablet (25 mg total) by mouth every 8 (eight) hours as needed. 01/04/22   Horton, Roxie HERO, DO  ferrous sulfate  325 (65 FE) MG tablet Take 325 mg by mouth every Monday, Wednesday, and Friday.    [provider]  gabapentin  (NEURONTIN ) 300 MG capsule Take by mouth. 02/03/22   [provider]  hydrOXYzine (ATARAX) 25 MG tablet Take 25 mg by mouth daily as needed for anxiety. 01/22/23   [provider]  ibuprofen  (ADVIL ) 800 MG tablet Take 1 tablet (800 mg total) by mouth 3 (three) times daily. 07/03/22   Elnor Hila P, DO  JARDIANCE 10 MG TABS tablet Take 10  mg by mouth daily. 01/02/22   [provider]  lidocaine  (LIDODERM ) 5 % Place 1 patch onto the skin daily. Remove & Discard patch within 12 hours or as directed by MD 07/17/22   Theadore Ozell HERO, MD  memantine (NAMENDA) 10 MG tablet Take 20 mg by mouth at bedtime.    [provider]  methocarbamol  (ROBAXIN ) 500 MG tablet Take 1 tablet (500 mg total) by mouth every 8 (eight) hours as needed for muscle spasms. 07/17/22   Theadore Ozell HERO, MD  metoCLOPramide  (REGLAN ) 10 MG tablet TAKE 1 TABLET BY MOUTH IN THE MORNING AND AT BEDTIME 06/20/22   Esterwood, Amy S, PA-C  mirtazapine (REMERON) 30 MG tablet Take 30 mg by mouth at bedtime. 10/29/21   [provider]  Multiple Vitamin (MULTIVITAMIN WITH MINERALS) TABS tablet Take 1 tablet by mouth daily.    [provider]  mupirocin  ointment (BACTROBAN ) 2 %  10/17/21   [provider]  omeprazole (PRILOSEC) 40 MG capsule Take 40 mg by mouth 2 (two) times daily before a meal.    [provider]  Oxycodone  HCl 20 MG TABS Take 1 tablet by mouth 5 (five) times daily. 08/09/20   [provider]  oxyCODONE -acetaminophen  (PERCOCET/ROXICET) 5-325 MG tablet Take 2 tablets by mouth 2 (two) times daily. 07/09/22   [provider]  pregabalin (LYRICA) 300 MG capsule Take 300 mg by mouth 2 (two) times daily.    [provider]  primidone  (MYSOLINE ) 50 MG tablet Take 50 mg by mouth 3 (three) times daily. For tremors    [provider]  propranolol  ER (INDERAL  LA) 60 MG 24 hr capsule Take 60 mg by mouth daily.    [provider]  rasagiline (AZILECT) 0.5 MG TABS tablet TAKE TWO TABLETS BY MOUTH ONCE A DAY TO SLOW PROGRESSION OF PARKINSON'S DISEASE 08/08/21   [provider]  RELISTOR 150 MG TABS Take 3 tablets by mouth daily. 12/30/21   [provider]  tamsulosin  (FLOMAX ) 0.4 MG CAPS capsule Take 0.4 mg by mouth at bedtime.    [provider]  Tapentadol HCl 100  MG TABS Take 150 mg by mouth 4 (four) times daily as needed (PAIN).    [provider]  Tiotropium Bromide Monohydrate 2.5 MCG/ACT AERS Inhale 2 puffs into the lungs daily.    [provider]  trifluoperazine (STELAZINE) 1 MG tablet Take 3 mg by mouth at bedtime.    [provider]  valACYclovir  (VALTREX ) 1000 MG tablet Take 1 tablet (1,000 mg total) by mouth 3 (three) times daily. 01/11/23   Geroldine Berg, MD  XTAMPZA  ER 18 MG C12A Take 1 capsule by mouth 2 (two) times daily. 07/09/22   [provider]    Allergies: Patient has  no known allergies.    Review of Systems  Updated Vital Signs BP 133/76   Pulse 89   Temp 98.4 F (36.9 C)   Resp 20   SpO2 98%   Physical Exam Vitals and nursing note reviewed.  Constitutional:      General: He is not in acute distress.    Appearance: Normal appearance. He is well-developed. He is not ill-appearing or diaphoretic.  HENT:     Head: Normocephalic and atraumatic.  Eyes:     General: No visual field deficit.    Extraocular Movements: Extraocular movements intact.     Conjunctiva/sclera: Conjunctivae normal.     Pupils: Pupils are equal, round, and reactive to light.  Cardiovascular:     Rate and Rhythm: Normal rate and regular rhythm.     Pulses: Normal pulses.     Heart sounds: Normal heart sounds. No murmur heard.    No friction rub. No gallop.  Pulmonary:     Effort: Pulmonary effort is normal. No respiratory distress.     Breath sounds: Normal breath sounds. No wheezing or rales.  Abdominal:     General: There is no distension.     Palpations: Abdomen is soft.     Tenderness: There is no abdominal tenderness. There is no guarding.  Musculoskeletal:        General: No swelling or tenderness.     Cervical back: Normal range of motion.  Skin:    General: Skin is warm and dry.     Findings: No erythema or rash.     Comments: 3cm diameter area ulceration occiput, no surrounding erythema, no  abscess  Neurological:     General: No focal deficit present.     Mental Status: He is alert and oriented to person, place, and time.     GCS: GCS eye subscore is 4. GCS verbal subscore is 5. GCS motor subscore is 6.     Cranial Nerves: No cranial nerve deficit, dysarthria or facial asymmetry.     Sensory: No sensory deficit.     Motor: No weakness or tremor.     Coordination: Coordination normal. Finger-Nose-Finger Test normal.     Gait: Gait normal.     Comments: tremor     (all labs ordered are listed, but only abnormal results are displayed) Labs Reviewed  COMPREHENSIVE METABOLIC PANEL WITH GFR - Abnormal; Notable for the following components:      Result Value   Glucose, Bld 123 (*)    All other components within normal limits  CBC - Abnormal; Notable for the following components:   WBC 14.6 (*)    All other components within normal limits  URINALYSIS, ROUTINE W REFLEX MICROSCOPIC - Abnormal; Notable for the following components:   Specific Gravity, Urine >1.030 (*)    All other components within normal limits  TROPONIN T, HIGH SENSITIVITY - Abnormal; Notable for the following components:   Troponin T High Sensitivity 28 (*)    All other components within normal limits  TROPONIN T, HIGH SENSITIVITY - Abnormal; Notable for the following components:   Troponin T High Sensitivity 27 (*)    All other components within normal limits  MAGNESIUM  LACTIC ACID, PLASMA  LACTIC ACID, PLASMA  CBG MONITORING, ED    EKG: EKG Interpretation Date/Time:  Wednesday December 21 2024 18:46:47 EST Ventricular Rate:  92 PR Interval:  169 QRS Duration:  99 QT Interval:  370 QTC Calculation: 458 R Axis:   82  Text Interpretation:  Sinus rhythm Borderline right axis deviation Borderline T abnormalities, inferior leads similar to prior Confirmed by Ginger Barefoot ([id number]) on 12/22/2024 11:30:25 AM  Radiology: DG Chest 2 View Result Date: 12/21/2024 EXAM: 2 VIEW(S) XRAY OF THE CHEST  12/21/2024 08:07:00 PM COMPARISON: Comparison with prior study dated 02/02/2023. CLINICAL HISTORY: sob FINDINGS: LUNGS AND PLEURA: Lungs are clear. No pleural effusion. No pneumothorax. HEART AND MEDIASTINUM: Heart size and pulmonary vascularity are normal. Mediastinal contours appear intact. Calcification of the aorta. BONES AND SOFT TISSUES: Old bilateral rib fractures. Degenerative changes in the spine. IMPRESSION: 1. No acute cardiopulmonary pathology. Electronically signed by: Elsie Gravely MD 12/21/2024 08:11 PM EST RP Workstation: [id number]   CT HEAD WO CONTRAST Result Date: 12/21/2024 CLINICAL DATA:  Mental status change EXAM: CT HEAD WITHOUT CONTRAST TECHNIQUE: Contiguous axial images were obtained from the base of the skull through the vertex without intravenous contrast. RADIATION DOSE REDUCTION: This exam was performed according to the departmental dose-optimization program which includes automated exposure control, adjustment of the mA and/or kV according to patient size and/or use of iterative reconstruction technique. COMPARISON:  CT 01/30/2023 FINDINGS: Brain: No acute territorial infarction, hemorrhage or intracranial mass. Mild white matter hypodensity likely chronic small vessel ischemic change. Stable ventricle size. Vascular: No hyperdense vessels.  Carotid vascular calcification Skull: Normal. Negative for fracture or focal lesion. Sinuses/Orbits: No acute finding. Other: Left suboccipital skin thickening and infiltration of the subcutaneous soft tissues, series 2 image 8. IMPRESSION: 1. No CT evidence for acute intracranial abnormality. 2. Mild chronic small vessel ischemic changes of the white matter. 3. Left suboccipital skin thickening and infiltration of the subcutaneous soft tissues, which may be due to inflammation/infection versus skin mass or lesion. Correlate with direct visualization Electronically Signed   By: Luke Bun M.D.   On: 12/21/2024 18:44     Procedures    Medications Ordered in the ED  doxycycline  (VIBRA -TABS) tablet 100 mg (100 mg Oral Given 12/21/24 2222)                                    Medical Decision Making Amount and/or Complexity of Data Reviewed Radiology: ordered.  Risk Prescription drug management.   75 year old male with a history of Parkinson's, OCD, depression, GERD, chronic wound to his posterior neck for which he receives treatment at the [facility] wound and hyperbaric center, chronic pain who presents with concern for generalized weakness.  Differential diagnosis includes arrhythmia, anemia, electrolyte abnormalities, ICH, CVA, ACS, pneumonia, infection including wound infection, UTI, influenza.  CT head was completed and evaluated by me and radiology showed no acute abnormalities.  Area has appearance of a chronic wound and a granulation state of healing without signs of surrounding cellulitis and without signs of abscess.  X-ray of the chest shows no evidence of pneumonia, pneumothorax, or pulmonary edema.  Labs completed and personally about interpreted by me show no clinically significant electrolyte abnormalities, no evidence of urinary tract infection, negative lactic acid, mild leukocytosis of 14,000.  His troponins are mildly elevated and stable on repeat and not consistent with ACS.  Do not have reason to suspect hepatic encephalopathy.  His neurologic exam i normal at this time, he does not have history of focal neurologic deficits, and have low suspicion for CVA for TIA.  Overall it sounds he may be having waxing and waning delirium in the setting of his Parkinson's disease, and given influenza contacts with the  rest of the family, suspect he also likely has influenza.  Given his symptoms have been ongoing for a week, I do not feel that testing will change his plan of care at this time.  He is alert, oriented and well-appearing with normal vital signs and feel stable for outpatient follow-up.  Those that have been  caring for his wound are concerned that it is looking different and concern for infection, will give prescription for doxycycline  and right recommend continued follow-up with his wound care center. Patient discharged in stable condition with understanding of reasons to return.     Final diagnoses:  Generalized weakness  Decreased appetite    ED Discharge Orders          Ordered    doxycycline  (VIBRAMYCIN ) 100 MG capsule  2 times daily        12/21/24 2216               Dreama Longs, MD 12/22/24 1207  "

## 2024-12-28 ENCOUNTER — Encounter (HOSPITAL_BASED_OUTPATIENT_CLINIC_OR_DEPARTMENT_OTHER): Admitting: General Surgery

## 2024-12-28 DIAGNOSIS — F424 Excoriation (skin-picking) disorder: Secondary | ICD-10-CM | POA: Insufficient documentation

## 2024-12-28 DIAGNOSIS — L98499 Non-pressure chronic ulcer of skin of other sites with unspecified severity: Secondary | ICD-10-CM | POA: Insufficient documentation

## 2024-12-28 DIAGNOSIS — G894 Chronic pain syndrome: Secondary | ICD-10-CM | POA: Insufficient documentation

## 2025-01-03 ENCOUNTER — Encounter (HOSPITAL_BASED_OUTPATIENT_CLINIC_OR_DEPARTMENT_OTHER): Attending: General Surgery | Admitting: General Surgery

## 2025-01-09 ENCOUNTER — Encounter (HOSPITAL_BASED_OUTPATIENT_CLINIC_OR_DEPARTMENT_OTHER): Admitting: General Surgery

## 2025-01-17 ENCOUNTER — Encounter (HOSPITAL_BASED_OUTPATIENT_CLINIC_OR_DEPARTMENT_OTHER): Admitting: General Surgery
